# Patient Record
Sex: Female | Born: 1935 | Race: White | Hispanic: No | Marital: Married | Smoking: Never smoker
Health system: Southern US, Community
[De-identification: ages and names within clinical notes are randomized; demographics above are authoritative.]

## PROBLEM LIST (undated history)

## (undated) DIAGNOSIS — I639 Cerebral infarction, unspecified: Secondary | ICD-10-CM

## (undated) DIAGNOSIS — Z8619 Personal history of other infectious and parasitic diseases: Secondary | ICD-10-CM

## (undated) DIAGNOSIS — Z87440 Personal history of urinary (tract) infections: Secondary | ICD-10-CM

## (undated) DIAGNOSIS — M069 Rheumatoid arthritis, unspecified: Secondary | ICD-10-CM

## (undated) DIAGNOSIS — M1990 Unspecified osteoarthritis, unspecified site: Secondary | ICD-10-CM

## (undated) DIAGNOSIS — I10 Essential (primary) hypertension: Secondary | ICD-10-CM

## (undated) DIAGNOSIS — E785 Hyperlipidemia, unspecified: Secondary | ICD-10-CM

## (undated) DIAGNOSIS — Z87442 Personal history of urinary calculi: Secondary | ICD-10-CM

## (undated) DIAGNOSIS — H269 Unspecified cataract: Secondary | ICD-10-CM

## (undated) DIAGNOSIS — F419 Anxiety disorder, unspecified: Secondary | ICD-10-CM

## (undated) HISTORY — DX: Personal history of other infectious and parasitic diseases: Z86.19

## (undated) HISTORY — DX: Hyperlipidemia, unspecified: E78.5

## (undated) HISTORY — DX: Rheumatoid arthritis, unspecified: M06.9

## (undated) HISTORY — PX: ABDOMINAL HYSTERECTOMY: SHX81

## (undated) HISTORY — DX: Personal history of urinary calculi: Z87.442

## (undated) HISTORY — DX: Personal history of urinary (tract) infections: Z87.440

## (undated) HISTORY — DX: Cerebral infarction, unspecified: I63.9

## (undated) HISTORY — DX: Unspecified cataract: H26.9

## (undated) HISTORY — PX: EYE SURGERY: SHX253

## (undated) HISTORY — DX: Unspecified osteoarthritis, unspecified site: M19.90

---
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
## 1970-03-03 HISTORY — PX: PARTIAL HYSTERECTOMY: SHX80

## 1993-03-03 HISTORY — PX: BREAST BIOPSY: SHX20

## 2004-10-01 ENCOUNTER — Ambulatory Visit: Payer: Self-pay | Admitting: Internal Medicine

## 2005-05-19 ENCOUNTER — Ambulatory Visit: Payer: Self-pay | Admitting: Unknown Physician Specialty

## 2006-06-02 ENCOUNTER — Ambulatory Visit: Payer: Self-pay | Admitting: Urology

## 2007-07-29 ENCOUNTER — Ambulatory Visit: Payer: Self-pay | Admitting: Internal Medicine

## 2007-10-18 ENCOUNTER — Ambulatory Visit: Payer: Self-pay | Admitting: Unknown Physician Specialty

## 2009-10-29 ENCOUNTER — Ambulatory Visit: Payer: Self-pay | Admitting: Internal Medicine

## 2011-03-27 ENCOUNTER — Emergency Department: Payer: Self-pay | Admitting: Emergency Medicine

## 2011-03-27 LAB — CK TOTAL AND CKMB (NOT AT ARMC)
CK, Total: 195 U/L (ref 21–215)
CK-MB: 0.7 ng/mL (ref 0.5–3.6)

## 2011-03-27 LAB — URINALYSIS, COMPLETE
Bilirubin,UR: NEGATIVE
Glucose,UR: NEGATIVE mg/dL (ref 0–75)
Ketone: NEGATIVE
Nitrite: NEGATIVE
Protein: NEGATIVE
RBC,UR: 1 /HPF (ref 0–5)

## 2011-03-27 LAB — COMPREHENSIVE METABOLIC PANEL
Albumin: 3.6 g/dL (ref 3.4–5.0)
Alkaline Phosphatase: 61 U/L (ref 50–136)
Anion Gap: 12 (ref 7–16)
BUN: 16 mg/dL (ref 7–18)
Calcium, Total: 9 mg/dL (ref 8.5–10.1)
Co2: 26 mmol/L (ref 21–32)
Creatinine: 0.97 mg/dL (ref 0.60–1.30)
EGFR (Non-African Amer.): 60 — ABNORMAL LOW
Glucose: 113 mg/dL — ABNORMAL HIGH (ref 65–99)
Osmolality: 280 (ref 275–301)
SGOT(AST): 38 U/L — ABNORMAL HIGH (ref 15–37)
SGPT (ALT): 30 U/L
Sodium: 139 mmol/L (ref 136–145)
Total Protein: 7.6 g/dL (ref 6.4–8.2)

## 2011-03-27 LAB — TROPONIN I: Troponin-I: <0.02 ng/mL

## 2011-03-27 LAB — CBC
HCT: 42.8 % (ref 35.0–47.0)
HGB: 14.3 g/dL (ref 12.0–16.0)
MCH: 29.2 pg (ref 26.0–34.0)
MCV: 87 fL (ref 80–100)
Platelet: 261 10*3/uL (ref 150–440)
RDW: 14 % (ref 11.5–14.5)
WBC: 9.2 10*3/uL (ref 3.6–11.0)

## 2011-12-02 DIAGNOSIS — Z87442 Personal history of urinary calculi: Secondary | ICD-10-CM

## 2011-12-02 HISTORY — DX: Personal history of urinary calculi: Z87.442

## 2011-12-15 DIAGNOSIS — Z23 Encounter for immunization: Secondary | ICD-10-CM | POA: Diagnosis not present

## 2012-03-02 ENCOUNTER — Other Ambulatory Visit: Payer: Self-pay | Admitting: Internal Medicine

## 2012-03-02 NOTE — Telephone Encounter (Signed)
[facility] [phone]  Atenolol 25 mg taelt  30 tab Take 1 tablet by mouth once a day  Pt has appointment with dr Lorin Picket 05/04/12

## 2012-03-02 NOTE — Telephone Encounter (Signed)
Clorazepate 3.75mg  30 tab   Fax sheet orange folder vanessa desk

## 2012-03-05 MED ORDER — ATENOLOL 25 MG PO TABS: 25.0000 mg | ORAL_TABLET | Freq: Every day | ORAL | Status: DC

## 2012-03-05 MED ORDER — CLORAZEPATE DIPOTASSIUM 3.75 MG PO TABS: 3.7500 mg | ORAL_TABLET | Freq: Every day | ORAL | Status: DC

## 2012-03-05 NOTE — Telephone Encounter (Signed)
Sent in to pharmacy.  

## 2012-04-14 ENCOUNTER — Telehealth: Payer: Self-pay | Admitting: *Deleted

## 2012-04-14 MED ORDER — PAROXETINE HCL 10 MG PO TABS: 10.0000 mg | ORAL_TABLET | ORAL | Status: DC

## 2012-04-14 NOTE — Telephone Encounter (Signed)
Refill Request  Paroxetine HCL 10 mg tablet  #30  Take 1 tablet by mouth once a day

## 2012-04-29 ENCOUNTER — Encounter: Payer: Self-pay | Admitting: *Deleted

## 2012-04-29 DIAGNOSIS — Z86010 Personal history of colonic polyps: Secondary | ICD-10-CM

## 2012-05-04 ENCOUNTER — Ambulatory Visit: Payer: Self-pay | Admitting: Internal Medicine

## 2012-06-10 ENCOUNTER — Other Ambulatory Visit: Payer: Self-pay | Admitting: *Deleted

## 2012-06-11 ENCOUNTER — Telehealth: Payer: Self-pay | Admitting: Internal Medicine

## 2012-06-11 MED ORDER — CLORAZEPATE DIPOTASSIUM 3.75 MG PO TABS: 3.7500 mg | ORAL_TABLET | Freq: Every day | ORAL | Status: DC

## 2012-06-11 MED ORDER — ATENOLOL 25 MG PO TABS: 25.0000 mg | ORAL_TABLET | Freq: Every day | ORAL | Status: DC

## 2012-06-11 MED ORDER — PAROXETINE HCL 10 MG PO TABS: 10.0000 mg | ORAL_TABLET | ORAL | Status: DC

## 2012-06-11 NOTE — Telephone Encounter (Signed)
Refilled paxil, tranxene, and atenolol x 1.  Needs to keep her appt

## 2012-06-17 DIAGNOSIS — S0083XA Contusion of other part of head, initial encounter: Secondary | ICD-10-CM | POA: Diagnosis not present

## 2012-06-17 DIAGNOSIS — S022XXA Fracture of nasal bones, initial encounter for closed fracture: Secondary | ICD-10-CM | POA: Diagnosis not present

## 2012-06-17 DIAGNOSIS — S1093XA Contusion of unspecified part of neck, initial encounter: Secondary | ICD-10-CM | POA: Diagnosis not present

## 2012-06-24 ENCOUNTER — Ambulatory Visit (INDEPENDENT_AMBULATORY_CARE_PROVIDER_SITE_OTHER): Payer: BC Managed Care – PPO | Admitting: Internal Medicine

## 2012-06-24 ENCOUNTER — Encounter: Payer: Self-pay | Admitting: Internal Medicine

## 2012-06-24 VITALS — BP 120/80 | HR 57 | Temp 97.6°F | Ht 65.5 in | Wt 148.5 lb

## 2012-06-24 DIAGNOSIS — I10 Essential (primary) hypertension: Secondary | ICD-10-CM

## 2012-06-24 DIAGNOSIS — E7800 Pure hypercholesterolemia, unspecified: Secondary | ICD-10-CM | POA: Diagnosis not present

## 2012-06-24 DIAGNOSIS — Z86010 Personal history of colonic polyps: Secondary | ICD-10-CM

## 2012-06-24 DIAGNOSIS — F411 Generalized anxiety disorder: Secondary | ICD-10-CM

## 2012-06-24 DIAGNOSIS — F419 Anxiety disorder, unspecified: Secondary | ICD-10-CM

## 2012-06-24 DIAGNOSIS — N200 Calculus of kidney: Secondary | ICD-10-CM

## 2012-06-24 MED ORDER — CLORAZEPATE DIPOTASSIUM 3.75 MG PO TABS: 3.7500 mg | ORAL_TABLET | Freq: Every day | ORAL | Status: DC

## 2012-06-24 MED ORDER — ATENOLOL 25 MG PO TABS: 25.0000 mg | ORAL_TABLET | Freq: Every day | ORAL | Status: DC

## 2012-06-24 MED ORDER — PAROXETINE HCL 10 MG PO TABS: 10.0000 mg | ORAL_TABLET | ORAL | Status: DC

## 2012-06-24 NOTE — Progress Notes (Signed)
  Subjective:    [name], female    DOB: 11/07/1935, 77 y.o.   MRN: [id number]  HPI 77 year old female with past history of nephrolithiasis, hypercholesterolemia and anxiety who comes in today to follow up on these issues as well as for a complete physical exam.  She has changed jobs.  Loves her new job.  Works from home 2 days/week.  Recently fractured her nose.  Was running after her dog and tripped over a wire.  Fell on her face.  Saw Dr Chestine Spore.  No intervention required.  Gave her some steroid nasal spray.  States her blood pressure has been doing well.  Stays active.  No cardiac symptoms with increased activity or exertion.  Eating and drinking well.  Taking an herbal supplement for her cholesterol.  Declines statin medication.  Bowels stable.  Mother passed away.  Handling stress well.    Past Medical History  Diagnosis Date  . History of kidney stones October 2013  . Arthritis   . Hyperlipidemia   . Hx: UTI (urinary tract infection)   . History of chicken pox     Review of Systems Patient denies any headache, lightheadedness or dizziness.  Nose healing well.  No significant pain now.  No chest pain, tightness or palpitations.  No increased shortness of breath, cough or congestion.  No nausea or vomiting.  No acid reflux.  No abdominal pain or cramping.  No bowel change, such as diarrhea, constipation, BRBPR or melana.  No urine change.  No vaginal symptoms. Handling stress well.      Objective:   Physical Exam Filed Vitals:   06/24/12 1107  BP: 120/80  Pulse: 57  Temp: 97.6 F (90.28 C)   77 year old female in no acute distress.   HEENT:  Nares- clear.  Oropharynx - without lesions. NECK:  Supple.  Nontender.  No audible bruit.  HEART:  Appears to be regular. LUNGS:  No crackles or wheezing audible.  Respirations even and unlabored.  RADIAL PULSE:  Equal bilaterally.    BREASTS:  No nipple discharge or nipple retraction present.  Could not appreciate any distinct  nodules or axillary adenopathy.  ABDOMEN:  Soft, nontender.  Bowel sounds present and normal.  No audible abdominal bruit.  GU:  Pt declined.    EXTREMITIES:  No increased edema present.  DP pulses palpable and equal bilaterally.           Assessment & Plan:  NASAL FRACTURE.  Saw Dr Chestine Spore.  Healing well.  Doing well.  Follow.   CARDIOVASCULAR.  Stays active.  Walks regularly.  No cardiac symptoms with increased activity or exertion.    HEALTH MAINTENANCE.  Physical today.  She declined GU/rectal exam.  Is s/p hysterectomy.  Has declined bone density.  She declines for me to schedule a mammogram.  Last colonoscopy 10/18/07 revealed four 8mm polyps in the descending colon, transverse colon, ascending colon and cecum and internal hemorrhoids.  Overdue follow up .  Agreed for GI referral today.

## 2012-06-25 ENCOUNTER — Encounter: Payer: Self-pay | Admitting: Internal Medicine

## 2012-06-25 DIAGNOSIS — Z86010 Personal history of colonic polyps: Secondary | ICD-10-CM | POA: Insufficient documentation

## 2012-06-25 DIAGNOSIS — E7800 Pure hypercholesterolemia, unspecified: Secondary | ICD-10-CM | POA: Insufficient documentation

## 2012-06-25 DIAGNOSIS — F419 Anxiety disorder, unspecified: Secondary | ICD-10-CM | POA: Insufficient documentation

## 2012-06-25 DIAGNOSIS — N200 Calculus of kidney: Secondary | ICD-10-CM | POA: Insufficient documentation

## 2012-06-25 NOTE — Assessment & Plan Note (Signed)
Last colonoscopy 10/18/07 with polyps.  Overdue follow up colonoscopy.  Agreed to referral to GI.

## 2012-06-25 NOTE — Assessment & Plan Note (Signed)
Low cholesterol diet and exercise.  Taking an herbal supplement for her cholesterol.  Declines statin medication.  Check lipid panel.

## 2012-06-25 NOTE — Assessment & Plan Note (Signed)
Has a history of kidney stones.  Previous CT negative.  Was worked up by Dr Lonna Cobb.  Currently asymptomatic.

## 2012-06-25 NOTE — Assessment & Plan Note (Signed)
Doing well on paxil and tranxene.  Follow.    

## 2012-06-30 ENCOUNTER — Other Ambulatory Visit: Payer: Medicare Other

## 2012-07-01 ENCOUNTER — Other Ambulatory Visit: Payer: Medicare Other

## 2012-07-13 ENCOUNTER — Encounter: Payer: Self-pay | Admitting: Internal Medicine

## 2012-09-07 ENCOUNTER — Other Ambulatory Visit: Payer: Self-pay | Admitting: *Deleted

## 2012-09-07 MED ORDER — CLORAZEPATE DIPOTASSIUM 3.75 MG PO TABS: 3.7500 mg | ORAL_TABLET | Freq: Every day | ORAL | Status: DC | PRN

## 2012-09-07 NOTE — Telephone Encounter (Signed)
Refilled tranxene #30 with one refill.  Ok to call in.

## 2012-09-07 NOTE — Telephone Encounter (Signed)
RX called into pharmacy

## 2012-09-07 NOTE — Telephone Encounter (Signed)
Okay to refill? 

## 2012-11-04 ENCOUNTER — Other Ambulatory Visit: Payer: Self-pay | Admitting: *Deleted

## 2012-11-04 DIAGNOSIS — H35379 Puckering of macula, unspecified eye: Secondary | ICD-10-CM | POA: Diagnosis not present

## 2012-11-04 DIAGNOSIS — H35371 Puckering of macula, right eye: Secondary | ICD-10-CM | POA: Insufficient documentation

## 2012-11-04 DIAGNOSIS — H2510 Age-related nuclear cataract, unspecified eye: Secondary | ICD-10-CM | POA: Diagnosis not present

## 2012-11-05 MED ORDER — CLORAZEPATE DIPOTASSIUM 3.75 MG PO TABS: 3.7500 mg | ORAL_TABLET | Freq: Every day | ORAL | Status: DC | PRN

## 2012-11-05 NOTE — Telephone Encounter (Signed)
Refilled #30 with one refill (tranxene)

## 2012-11-29 DIAGNOSIS — H2513 Age-related nuclear cataract, bilateral: Secondary | ICD-10-CM | POA: Insufficient documentation

## 2012-11-29 DIAGNOSIS — H35379 Puckering of macula, unspecified eye: Secondary | ICD-10-CM | POA: Diagnosis not present

## 2012-11-29 DIAGNOSIS — H35372 Puckering of macula, left eye: Secondary | ICD-10-CM | POA: Insufficient documentation

## 2012-12-03 ENCOUNTER — Other Ambulatory Visit: Payer: Self-pay | Admitting: *Deleted

## 2012-12-03 MED ORDER — PAROXETINE HCL 10 MG PO TABS: 10.0000 mg | ORAL_TABLET | ORAL | Status: DC

## 2012-12-03 MED ORDER — ATENOLOL 25 MG PO TABS: 25.0000 mg | ORAL_TABLET | Freq: Every day | ORAL | Status: DC

## 2012-12-13 DIAGNOSIS — L820 Inflamed seborrheic keratosis: Secondary | ICD-10-CM | POA: Diagnosis not present

## 2012-12-13 DIAGNOSIS — Z0189 Encounter for other specified special examinations: Secondary | ICD-10-CM | POA: Diagnosis not present

## 2012-12-24 ENCOUNTER — Encounter: Payer: Self-pay | Admitting: *Deleted

## 2012-12-24 ENCOUNTER — Ambulatory Visit: Payer: Self-pay | Admitting: Unknown Physician Specialty

## 2012-12-24 DIAGNOSIS — Z09 Encounter for follow-up examination after completed treatment for conditions other than malignant neoplasm: Secondary | ICD-10-CM | POA: Diagnosis not present

## 2012-12-24 DIAGNOSIS — K5730 Diverticulosis of large intestine without perforation or abscess without bleeding: Secondary | ICD-10-CM | POA: Diagnosis not present

## 2012-12-24 DIAGNOSIS — Z860100 Personal history of colon polyps, unspecified: Secondary | ICD-10-CM | POA: Diagnosis not present

## 2012-12-24 DIAGNOSIS — D126 Benign neoplasm of colon, unspecified: Secondary | ICD-10-CM | POA: Diagnosis not present

## 2012-12-24 DIAGNOSIS — K649 Unspecified hemorrhoids: Secondary | ICD-10-CM | POA: Diagnosis not present

## 2012-12-24 DIAGNOSIS — Z79899 Other long term (current) drug therapy: Secondary | ICD-10-CM | POA: Diagnosis not present

## 2012-12-27 ENCOUNTER — Ambulatory Visit: Payer: Medicare Other | Admitting: Internal Medicine

## 2012-12-27 LAB — PATHOLOGY REPORT

## 2013-01-17 ENCOUNTER — Encounter: Payer: Self-pay | Admitting: Internal Medicine

## 2013-01-18 ENCOUNTER — Other Ambulatory Visit: Payer: Self-pay | Admitting: *Deleted

## 2013-01-19 ENCOUNTER — Encounter: Payer: Self-pay | Admitting: *Deleted

## 2013-01-19 MED ORDER — CLORAZEPATE DIPOTASSIUM 3.75 MG PO TABS: 3.7500 mg | ORAL_TABLET | Freq: Every day | ORAL | Status: DC

## 2013-01-19 NOTE — Telephone Encounter (Signed)
noted 

## 2013-01-19 NOTE — Telephone Encounter (Signed)
Refilled medication (no refills) & I mailed pt a letter to call the office to schedule an appt before she needs another refill. Tried to reach patient by phone (disconnected)

## 2013-01-19 NOTE — Telephone Encounter (Signed)
Can refill x 1, but she missed her last appt.  Need to reschedule her for a 30 minute f/u appt.

## 2013-01-31 ENCOUNTER — Encounter: Payer: Self-pay | Admitting: Internal Medicine

## 2013-01-31 ENCOUNTER — Ambulatory Visit (INDEPENDENT_AMBULATORY_CARE_PROVIDER_SITE_OTHER): Payer: Medicare Other | Admitting: Internal Medicine

## 2013-01-31 ENCOUNTER — Encounter: Payer: Self-pay | Admitting: Emergency Medicine

## 2013-01-31 VITALS — BP 130/90 | HR 53 | Temp 97.5°F | Ht 65.5 in | Wt 147.5 lb

## 2013-01-31 DIAGNOSIS — E7800 Pure hypercholesterolemia, unspecified: Secondary | ICD-10-CM

## 2013-01-31 DIAGNOSIS — Z1239 Encounter for other screening for malignant neoplasm of breast: Secondary | ICD-10-CM

## 2013-01-31 DIAGNOSIS — I10 Essential (primary) hypertension: Secondary | ICD-10-CM

## 2013-01-31 DIAGNOSIS — N200 Calculus of kidney: Secondary | ICD-10-CM

## 2013-01-31 DIAGNOSIS — Z86010 Personal history of colonic polyps: Secondary | ICD-10-CM

## 2013-01-31 DIAGNOSIS — R82998 Other abnormal findings in urine: Secondary | ICD-10-CM | POA: Diagnosis not present

## 2013-01-31 DIAGNOSIS — M549 Dorsalgia, unspecified: Secondary | ICD-10-CM | POA: Diagnosis not present

## 2013-01-31 DIAGNOSIS — F419 Anxiety disorder, unspecified: Secondary | ICD-10-CM

## 2013-01-31 DIAGNOSIS — R8290 Unspecified abnormal findings in urine: Secondary | ICD-10-CM

## 2013-01-31 DIAGNOSIS — Z23 Encounter for immunization: Secondary | ICD-10-CM | POA: Diagnosis not present

## 2013-01-31 DIAGNOSIS — F411 Generalized anxiety disorder: Secondary | ICD-10-CM

## 2013-01-31 LAB — LIPID PANEL
Cholesterol: 305 mg/dL — ABNORMAL HIGH (ref 0–200)
HDL: 34.5 mg/dL — ABNORMAL LOW (ref >39.00)
Total CHOL/HDL Ratio: 9
Triglycerides: 327 mg/dL — ABNORMAL HIGH (ref 0.0–149.0)
VLDL: 65.4 mg/dL — ABNORMAL HIGH (ref 0.0–40.0)

## 2013-01-31 LAB — CBC WITH DIFFERENTIAL/PLATELET
Basophils Absolute: 0.1 10*3/uL (ref 0.0–0.1)
Basophils Relative: 0.9 % (ref 0.0–3.0)
Eosinophils Absolute: 0.3 10*3/uL (ref 0.0–0.7)
MCHC: 33.5 g/dL (ref 30.0–36.0)
MCV: 86.5 fl (ref 78.0–100.0)
Monocytes Absolute: 0.6 10*3/uL (ref 0.1–1.0)
Monocytes Relative: 7.5 % (ref 3.0–12.0)
Neutro Abs: 4.9 10*3/uL (ref 1.4–7.7)
Neutrophils Relative %: 58.2 % (ref 43.0–77.0)
Platelets: 299 10*3/uL (ref 150.0–400.0)
RBC: 5.07 Mil/uL (ref 3.87–5.11)
RDW: 14.1 % (ref 11.5–14.6)

## 2013-01-31 LAB — URINALYSIS, ROUTINE W REFLEX MICROSCOPIC
Bilirubin Urine: NEGATIVE
Ketones, ur: NEGATIVE
Leukocytes, UA: NEGATIVE
Nitrite: NEGATIVE
RBC / HPF: NONE SEEN (ref 0–?)
Specific Gravity, Urine: >=1.03 (ref 1.000–1.030)
Urobilinogen, UA: 0.2 (ref 0.0–1.0)
pH: 5.5 (ref 5.0–8.0)

## 2013-01-31 LAB — COMPREHENSIVE METABOLIC PANEL
AST: 25 U/L (ref 0–37)
Alkaline Phosphatase: 55 U/L (ref 39–117)
BUN: 18 mg/dL (ref 6–23)
Creatinine, Ser: 1 mg/dL (ref 0.4–1.2)
Total Protein: 7.3 g/dL (ref 6.0–8.3)

## 2013-01-31 LAB — TSH: TSH: 1.8 u[IU]/mL (ref 0.35–5.50)

## 2013-01-31 NOTE — Progress Notes (Signed)
Subjective:    [name], female    DOB: 1935/03/08, 77 y.o.   MRN: [id number]  HPI 77 year old female with past history of nephrolithiasis, hypercholesterolemia and anxiety who comes in today to follow up on these issues as well as for a complete physical exam.  Stays active.  No cardiac symptoms with increased activity or exertion.  Eating and drinking well.  She reports that starting last night, she noticed some left lower back pain.  Took hydrocodone x 2.  She is not having any pain now.  States she is concerned about another kidney stone.  Has seen Dr Lonna Cobb previously.  Has flares intermittently.  Last episode (prior to last night) was six months ago.  No urinary symptoms now except for some odor with her urine.  No abdominal pain or back pain.  No nausea or vomiting.  She is taking the tranxene regularly now - one per night.  She is not working now.  Some increased stress related to this.  Had her colonoscopy 12/24/12.  Bowels stable.      Past Medical History  Diagnosis Date  . History of kidney stones October 2013  . Arthritis   . Hyperlipidemia   . Hx: UTI (urinary tract infection)   . History of chicken pox     Current Outpatient Prescriptions on File Prior to Visit  Medication Sig Dispense Refill  . aspirin 81 MG tablet Take 81 mg by mouth daily.      Marland Kitchen atenolol (TENORMIN) 25 MG tablet Take 1 tablet (25 mg total) by mouth daily.  30 tablet  5  . clorazepate (TRANXENE) 3.75 MG tablet Take 1 tablet (3.75 mg total) by mouth daily. **NEEDS APPT FOR ADDITIONAL REFILLS**PLEASE CONTACT OFFICE FOR APPT**  30 tablet  0  . PARoxetine (PAXIL) 10 MG tablet Take 1 tablet (10 mg total) by mouth every morning.  30 tablet  5  . vitamin C (ASCORBIC ACID) 500 MG tablet Take 500 mg by mouth 2 (two) times daily.       No current facility-administered medications on file prior to visit.    Review of Systems Patient denies any headache, lightheadedness or dizziness.  No sinus or allergy  symptoms.  No chest pain, tightness or palpitations.  No increased shortness of breath, cough or congestion.  No nausea or vomiting.  No acid reflux.  No abdominal pain or cramping.  No bowel change, such as diarrhea, constipation, BRBPR or melana.  Some urinary odor as outlined.   No vaginal symptoms.  The back pain has resolved.  No pain now.      Objective:   Physical Exam  Filed Vitals:   01/31/13 0821  BP: 130/90  Pulse: 53  Temp: 97.5 F (36.4 C)   Blood pressure recheck:  12268, pulse 37-63  77 year old female in no acute distress.   HEENT:  Nares- clear.  Oropharynx - without lesions. NECK:  Supple.  Nontender.  No audible bruit.  HEART:  Appears to be regular. LUNGS:  No crackles or wheezing audible.  Respirations even and unlabored.  RADIAL PULSE:  Equal bilaterally.  ABDOMEN:  Soft, nontender.  Bowel sounds present and normal.  No audible abdominal bruit.   EXTREMITIES:  No increased edema present.  DP pulses palpable and equal bilaterally.      BACK:  No CVA tenderness.       Assessment & Plan:  CARDIOVASCULAR.  Stays active.  Walks regularly.  No cardiac symptoms  with increased activity or exertion.    HEALTH MAINTENANCE.  Physical last visit.  She declined GU/rectal exam.  Is s/p hysterectomy.  Has declined bone density.   Last colonoscopy 12/24/12 revealed one cecal polyp and one polyp in the descending colon and diverticuclosis.  Schedule a mammogram.     I spent 25 minutes with the patient and more than 50% of the time was spent in consultation regarding the above.

## 2013-01-31 NOTE — Progress Notes (Signed)
Pre-visit discussion using our clinic review tool. No additional management support is needed unless otherwise documented below in the visit note.  

## 2013-02-01 ENCOUNTER — Encounter: Payer: Self-pay | Admitting: Internal Medicine

## 2013-02-01 ENCOUNTER — Encounter: Payer: Self-pay | Admitting: *Deleted

## 2013-02-01 NOTE — Assessment & Plan Note (Signed)
Doing well on paxil and tranxene.  Follow.    

## 2013-02-01 NOTE — Assessment & Plan Note (Signed)
Colonoscopy as outlined.  Bowels doing well.     

## 2013-02-01 NOTE — Assessment & Plan Note (Signed)
Had back pain last night.  Resolved now.  Discussed with her today regarding further w/up.  She declined.  Discussed referral to urology.  She declined.  Will check urinalysis and culture.

## 2013-02-01 NOTE — Assessment & Plan Note (Signed)
Low cholesterol diet and exercise.   Declines statin medication.  Check lipid panel.    

## 2013-02-02 ENCOUNTER — Encounter: Payer: Self-pay | Admitting: *Deleted

## 2013-02-15 DIAGNOSIS — Z1231 Encounter for screening mammogram for malignant neoplasm of breast: Secondary | ICD-10-CM | POA: Diagnosis not present

## 2013-02-15 DIAGNOSIS — R922 Inconclusive mammogram: Secondary | ICD-10-CM | POA: Diagnosis not present

## 2013-03-01 ENCOUNTER — Other Ambulatory Visit: Payer: Self-pay | Admitting: *Deleted

## 2013-03-01 MED ORDER — CLORAZEPATE DIPOTASSIUM 3.75 MG PO TABS: 3.7500 mg | ORAL_TABLET | Freq: Every day | ORAL | Status: DC | PRN

## 2013-03-01 NOTE — Telephone Encounter (Signed)
Refilled #30 with one refill.  Pt had appt 12/14.

## 2013-03-09 DIAGNOSIS — N63 Unspecified lump in unspecified breast: Secondary | ICD-10-CM | POA: Diagnosis not present

## 2013-03-09 DIAGNOSIS — R928 Other abnormal and inconclusive findings on diagnostic imaging of breast: Secondary | ICD-10-CM | POA: Diagnosis not present

## 2013-03-09 DIAGNOSIS — N6009 Solitary cyst of unspecified breast: Secondary | ICD-10-CM | POA: Diagnosis not present

## 2013-03-09 LAB — HM MAMMOGRAPHY

## 2013-03-10 ENCOUNTER — Encounter: Payer: Self-pay | Admitting: Internal Medicine

## 2013-05-18 ENCOUNTER — Encounter: Payer: Self-pay | Admitting: Internal Medicine

## 2013-06-29 ENCOUNTER — Other Ambulatory Visit: Payer: Self-pay | Admitting: *Deleted

## 2013-06-29 MED ORDER — ATENOLOL 25 MG PO TABS: 25.0000 mg | ORAL_TABLET | Freq: Every day | ORAL | Status: DC

## 2013-07-01 ENCOUNTER — Encounter: Payer: Commercial Managed Care - PPO | Admitting: Internal Medicine

## 2013-07-11 ENCOUNTER — Telehealth: Payer: Self-pay | Admitting: Internal Medicine

## 2013-07-11 ENCOUNTER — Other Ambulatory Visit: Payer: Medicare Other

## 2013-07-11 NOTE — Telephone Encounter (Signed)
If she is having body aches and thinks has Lyme's - needs appt.  I cannot just order labs without evaluating.  She needs to be seen before labs are ordered - so know what labs need to be done.  These labs take a while to come back and would need treatment prior to lab results returning - if treatment needed.

## 2013-07-11 NOTE — Telephone Encounter (Signed)
Pt notified. Appt scheduled with Raquel for tomorrow for evaluation and labs

## 2013-07-11 NOTE — Telephone Encounter (Signed)
Pt states she needs to come in for blood work.  States she thinks she may have Lyme's disease because her yard is covered with ticks and she has been feeling bad with unusual body pain.  States she only needs the lab.  Appt has been scheduled with Dr. Nicki Reaper 5/14 but pt wants to come today.  Lab appt scheduled.  Please advise.

## 2013-07-12 ENCOUNTER — Ambulatory Visit (INDEPENDENT_AMBULATORY_CARE_PROVIDER_SITE_OTHER): Payer: Medicare Other | Admitting: Adult Health

## 2013-07-12 ENCOUNTER — Encounter: Payer: Self-pay | Admitting: Adult Health

## 2013-07-12 VITALS — BP 124/70 | HR 63 | Temp 97.6°F | Resp 14 | Wt 146.5 lb

## 2013-07-12 DIAGNOSIS — M2550 Pain in unspecified joint: Secondary | ICD-10-CM | POA: Diagnosis not present

## 2013-07-12 DIAGNOSIS — W57XXXA Bitten or stung by nonvenomous insect and other nonvenomous arthropods, initial encounter: Secondary | ICD-10-CM

## 2013-07-12 DIAGNOSIS — M353 Polymyalgia rheumatica: Secondary | ICD-10-CM | POA: Insufficient documentation

## 2013-07-12 DIAGNOSIS — T148 Other injury of unspecified body region: Secondary | ICD-10-CM

## 2013-07-12 LAB — SEDIMENTATION RATE: Sed Rate: 53 mm/hr — ABNORMAL HIGH (ref 0–22)

## 2013-07-12 MED ORDER — DOXYCYCLINE HYCLATE 100 MG PO TABS: 100.0000 mg | ORAL_TABLET | Freq: Two times a day (BID) | ORAL | Status: DC

## 2013-07-12 MED ORDER — CLORAZEPATE DIPOTASSIUM 3.75 MG PO TABS: 3.7500 mg | ORAL_TABLET | Freq: Every day | ORAL | Status: DC | PRN

## 2013-07-12 MED ORDER — ATENOLOL 25 MG PO TABS: 25.0000 mg | ORAL_TABLET | Freq: Every day | ORAL | Status: DC

## 2013-07-12 MED ORDER — PAROXETINE HCL 10 MG PO TABS: 10.0000 mg | ORAL_TABLET | ORAL | Status: DC

## 2013-07-12 NOTE — Progress Notes (Signed)
[name], female   DOB: 01/17/1936, 78 y.o.   MRN: [id number]    Subjective:    [name], female    DOB: 1935/08/26, 78 y.o.   MRN: [id number]  HPI  Pt presents with multiple tick bites over a period of 3 months. She has been experiencing some joint pains. First started in the right shoulder then progressed to the left shoulder. Now she is experiencing some lower extremity muscle aches. Also aching in her back. She has been taking BC powders. She reports that her daughter is telling her that she has some memory changes. She reports having low grade temp of 99.5 and also had some chills. Also reports headaches. She wants to be tested for lyme dz. No rashes - erythema migrans. No shortness of breath, irregular heart palpitations or sensations. No syncope.   Past Medical History  Diagnosis Date  . History of kidney stones October 2013  . Arthritis   . Hyperlipidemia   . Hx: UTI (urinary tract infection)   . History of chicken pox     Past Surgical History  Procedure Laterality Date  . Breast biopsy Right 1995  . Tonsillectomy and adenoidectomy  1945  . Partial hysterectomy  1972    Family History  Problem Relation Age of Onset  . Heart disease Father     History   Social History  . Marital Status: Married    Spouse Name: N/A    Number of Children: N/A  . Years of Education: N/A   Occupational History  . Not on file.   Social History Main Topics  . Smoking status: Never Smoker   . Smokeless tobacco: Never Used  . Alcohol Use: No  . Drug Use: No  . Sexual Activity: Not on file   Other Topics Concern  . Not on file   Social History Narrative  . No narrative on file    Current Outpatient Prescriptions on File Prior to Visit  Medication Sig Dispense Refill  . aspirin 81 MG tablet Take 81 mg by mouth daily.      Marland Kitchen atenolol (TENORMIN) 25 MG tablet Take 1 tablet (25 mg total) by mouth daily.  30 tablet  0  . clorazepate (TRANXENE) 3.75 MG tablet  Take 1 tablet (3.75 mg total) by mouth daily as needed for anxiety.  30 tablet  1  . PARoxetine (PAXIL) 10 MG tablet Take 1 tablet (10 mg total) by mouth every morning.  30 tablet  5  . vitamin C (ASCORBIC ACID) 500 MG tablet Take 500 mg by mouth 2 (two) times daily.       No current facility-administered medications on file prior to visit.    Review of Systems  Constitutional: Positive for fever (low grade), chills and fatigue.  Respiratory: Negative.   Cardiovascular: Negative.   Musculoskeletal: Positive for arthralgias and myalgias.  Skin: Negative for rash.  Neurological: Positive for headaches.  Psychiatric/Behavioral:       Forgetful       Objective:  BP 124/70  Pulse 63  Temp(Src) 97.6 F (36.4 C) (Oral)  Resp 14  Wt 146 lb 8 oz (66.452 kg)  SpO2 98%   Physical Exam  Constitutional: She is oriented to person, place, and time. She appears well-developed and well-nourished. No distress.  HENT:  Head: Normocephalic and atraumatic.  Eyes: Conjunctivae and EOM are normal.  Neck: Normal range of motion. Neck supple.  Cardiovascular: Normal rate, regular rhythm, normal heart sounds and  intact distal pulses.  Exam reveals no gallop and no friction rub.   No murmur heard. Pulmonary/Chest: Effort normal and breath sounds normal. No respiratory distress. She has no wheezes. She has no rales.  Abdominal: Soft. Bowel sounds are normal. She exhibits no distension and no mass. There is no tenderness. There is no rebound and no guarding.  Musculoskeletal: She exhibits tenderness.  Bilateral upper extremity with decreased ROM. Unable to abduct arm fully. Can only abduct to 90 degree. Arthralgias  Neurological: She is alert and oriented to person, place, and time. She has normal reflexes. Coordination normal.  Skin: Skin is warm and dry.  Psychiatric: She has a normal mood and affect. Her behavior is normal. Judgment and thought content normal.      Assessment & Plan:   1. Tick  bites Multiple ticks removed from her body in the last 3 months. Reports ongoing arthralgias and myalgias for the past 2 months. Check for lyme dz - Elisa. If negative then no further testing. If positive, will need to do western blot. Note, spent greater than 30 minutes in the assessment, evaluation, implementation of care as well as in education regarding tick borne illness. I am treating her empirically with Doxycycline bid x 14 days.  2. Arthralgia Will check additional blood work given her report of arthralgias. - Sedimentation rate - Rheumatoid factor

## 2013-07-12 NOTE — Patient Instructions (Signed)
  Please have your blood work drawn prior to leaving the office. I am checking for lyme disease.  Please start Doxycycline 100 mg twice a day for 14 days.

## 2013-07-12 NOTE — Progress Notes (Signed)
Pre visit review using our clinic review tool, if applicable. No additional management support is needed unless otherwise documented below in the visit note. 

## 2013-07-13 ENCOUNTER — Other Ambulatory Visit: Payer: Self-pay | Admitting: *Deleted

## 2013-07-13 DIAGNOSIS — M2550 Pain in unspecified joint: Secondary | ICD-10-CM | POA: Diagnosis not present

## 2013-07-14 ENCOUNTER — Other Ambulatory Visit: Payer: Self-pay | Admitting: Adult Health

## 2013-07-14 ENCOUNTER — Ambulatory Visit (INDEPENDENT_AMBULATORY_CARE_PROVIDER_SITE_OTHER): Payer: Medicare Other | Admitting: Internal Medicine

## 2013-07-14 ENCOUNTER — Encounter: Payer: Self-pay | Admitting: Internal Medicine

## 2013-07-14 VITALS — BP 120/70 | HR 64 | Temp 98.0°F | Ht 65.5 in | Wt 147.2 lb

## 2013-07-14 DIAGNOSIS — IMO0001 Reserved for inherently not codable concepts without codable children: Secondary | ICD-10-CM

## 2013-07-14 DIAGNOSIS — M791 Myalgia, unspecified site: Secondary | ICD-10-CM

## 2013-07-14 DIAGNOSIS — M2550 Pain in unspecified joint: Secondary | ICD-10-CM | POA: Diagnosis not present

## 2013-07-14 DIAGNOSIS — R700 Elevated erythrocyte sedimentation rate: Secondary | ICD-10-CM

## 2013-07-14 LAB — RHEUMATOID FACTOR: Rhuematoid fact SerPl-aCnc: <10 IU/mL (ref <=14)

## 2013-07-14 LAB — B. BURGDORFI ANTIBODIES: B burgdorferi Ab IgG+IgM: 0.18 {ISR}

## 2013-07-15 ENCOUNTER — Encounter: Payer: Self-pay | Admitting: Internal Medicine

## 2013-07-15 LAB — CBC WITH DIFFERENTIAL/PLATELET
BASOS PCT: 0.3 % (ref 0.0–3.0)
Basophils Absolute: 0 10*3/uL (ref 0.0–0.1)
EOS PCT: 2.5 % (ref 0.0–5.0)
Eosinophils Absolute: 0.4 10*3/uL (ref 0.0–0.7)
HEMATOCRIT: 44.1 % (ref 36.0–46.0)
Hemoglobin: 14.8 g/dL (ref 12.0–15.0)
LYMPHS ABS: 2.9 10*3/uL (ref 0.7–4.0)
Lymphocytes Relative: 19.9 % (ref 12.0–46.0)
MCHC: 33.5 g/dL (ref 30.0–36.0)
MCV: 87 fl (ref 78.0–100.0)
MONO ABS: 0.9 10*3/uL (ref 0.1–1.0)
MONOS PCT: 6.1 % (ref 3.0–12.0)
Neutro Abs: 10.4 10*3/uL — ABNORMAL HIGH (ref 1.4–7.7)
Neutrophils Relative %: 71.2 % (ref 43.0–77.0)
PLATELETS: 403 10*3/uL — AB (ref 150.0–400.0)
RBC: 5.07 Mil/uL (ref 3.87–5.11)
RDW: 13.7 % (ref 11.5–15.5)
WBC: 14.6 10*3/uL — AB (ref 4.0–10.5)

## 2013-07-15 LAB — SEDIMENTATION RATE: Sed Rate: 42 mm/hr — ABNORMAL HIGH (ref 0–22)

## 2013-07-15 LAB — CK: CK TOTAL: 42 U/L (ref 7–177)

## 2013-07-15 NOTE — Progress Notes (Signed)
Subjective:    [name], female    DOB: January 02, 1936, 78 y.o.   MRN: [id number]  HPI 78 year old female with past history of nephrolithiasis, hypercholesterolemia and anxiety who comes in today for a scheduled follow up.  She was just seen two days ago by [employer].  Was evaluated for fever, pain and concern over possible Lymes.  See Raquel's note for details.  She states that starting about 2-4 weeks ago, she had a cold and cough.  This resolved.  She then has developed increased shoulder pain and upper arm pain.  Describes most of her pain in her posterior neck, upper arms and shoulder.  Some headache.  When she turns her head, some pulling in her neck.  She was started on doxycycline and has been taking for two days.  States her neck feels some better.  No rash now.  She also describes some leg aching.  Eating and drinking.  Of note, states that her left wrist and thumb swelled and few weeks ago and then resolve.  Taking ibuprofren.       Past Medical History  Diagnosis Date  . History of kidney stones October 2013  . Arthritis   . Hyperlipidemia   . Hx: UTI (urinary tract infection)   . History of chicken pox     Current Outpatient Prescriptions on File Prior to Visit  Medication Sig Dispense Refill  . aspirin 81 MG tablet Take 81 mg by mouth daily.      Marland Kitchen atenolol (TENORMIN) 25 MG tablet Take 1 tablet (25 mg total) by mouth daily.  30 tablet  2  . clorazepate (TRANXENE) 3.75 MG tablet Take 1 tablet (3.75 mg total) by mouth daily as needed for anxiety.  30 tablet  0  . doxycycline (VIBRA-TABS) 100 MG tablet Take 1 tablet (100 mg total) by mouth 2 (two) times daily.  28 tablet  0  . PARoxetine (PAXIL) 10 MG tablet Take 1 tablet (10 mg total) by mouth every morning.  30 tablet  5  . vitamin C (ASCORBIC ACID) 500 MG tablet Take 500 mg by mouth 2 (two) times daily.       No current facility-administered medications on file prior to visit.    Review of Systems Patient denies  any significant headache, lightheadedness or dizziness.  Mild headache.  No sinus or allergy symptoms.  No chest pain, tightness or palpitations.  No increased shortness of breath, cough or congestion.  No nausea or vomiting.  No acid reflux.  No abdominal pain or cramping.  No bowel change, such as diarrhea.  Neck, shoulder and upper arm pain as outlined.  No fever.       Objective:   Physical Exam  Filed Vitals:   07/14/13 1555  BP: 120/70  Pulse: 64  Temp: 98 F (53.1 C)   78 year old female in no acute distress.   HEENT:  Nares- clear.  Oropharynx - without lesions. NECK:  Supple.  Nontender.  No audible bruit.  HEART:  Appears to be regular. LUNGS:  No crackles or wheezing audible.  Respirations even and unlabored.  RADIAL PULSE:  Equal bilaterally.  ABDOMEN:  Soft, nontender.  Bowel sounds present and normal.  No audible abdominal bruit.   EXTREMITIES:  No increased edema present.  DP pulses palpable and equal bilaterally.      MSK:  Increased pain to palpation over the posterior shoulders.  Increased pain with attempts at abduction and extension of  her upper extremities - especially at or above 90 degrees.  No focal motor weakness appreciated upper or lower extremities.       Assessment & Plan:  CARDIOVASCULAR.  Stays active.  Previously walks regularly.    HEALTH MAINTENANCE.   She declined GU/rectal exam.  Is s/p hysterectomy.  Has declined bone density.   Last colonoscopy 12/24/12 revealed one cecal polyp and one polyp in the descending colon and diverticuclosis.  Mammogram 02/15/13 recommended f/u views.  F/u ultrasound Birads II.     I spent 25 minutes with the patient and more than 50% of the time was spent in consultation regarding the above.

## 2013-07-15 NOTE — Assessment & Plan Note (Signed)
Persistent increased shoulder, neck and upper arm pain.  Also has some leg pain as well.  On doxycyline.  Does note some improvement.  Given the limitation, etc, will recheck ESR.  Check CK and cbc.  Continue doxycycline.  Follow closely.  If any worsening change in symptoms or problems she is to be reevaluated.

## 2013-07-19 ENCOUNTER — Other Ambulatory Visit: Payer: Self-pay | Admitting: Internal Medicine

## 2013-07-19 ENCOUNTER — Other Ambulatory Visit (INDEPENDENT_AMBULATORY_CARE_PROVIDER_SITE_OTHER): Payer: Medicare Other

## 2013-07-19 DIAGNOSIS — R700 Elevated erythrocyte sedimentation rate: Secondary | ICD-10-CM

## 2013-07-19 DIAGNOSIS — D72829 Elevated white blood cell count, unspecified: Secondary | ICD-10-CM

## 2013-07-19 LAB — SEDIMENTATION RATE: SED RATE: 32 mm/h — AB (ref 0–22)

## 2013-07-19 NOTE — Progress Notes (Signed)
Order placed for f/u cbc.   

## 2013-07-20 ENCOUNTER — Other Ambulatory Visit (INDEPENDENT_AMBULATORY_CARE_PROVIDER_SITE_OTHER): Payer: Medicare Other

## 2013-07-20 DIAGNOSIS — D72829 Elevated white blood cell count, unspecified: Secondary | ICD-10-CM | POA: Diagnosis not present

## 2013-07-20 LAB — CBC WITH DIFFERENTIAL/PLATELET
BASOS PCT: 0.5 % (ref 0.0–3.0)
Basophils Absolute: 0.1 10*3/uL (ref 0.0–0.1)
Eosinophils Absolute: 0.2 10*3/uL (ref 0.0–0.7)
Eosinophils Relative: 2.1 % (ref 0.0–5.0)
HCT: 42.2 % (ref 36.0–46.0)
HEMOGLOBIN: 14.1 g/dL (ref 12.0–15.0)
LYMPHS PCT: 19.5 % (ref 12.0–46.0)
Lymphs Abs: 2.1 10*3/uL (ref 0.7–4.0)
MCHC: 33.3 g/dL (ref 30.0–36.0)
MCV: 86.7 fl (ref 78.0–100.0)
Monocytes Absolute: 0.6 10*3/uL (ref 0.1–1.0)
Monocytes Relative: 5.1 % (ref 3.0–12.0)
NEUTROS ABS: 7.8 10*3/uL — AB (ref 1.4–7.7)
Neutrophils Relative %: 72.8 % (ref 43.0–77.0)
Platelets: 398 10*3/uL (ref 150.0–400.0)
RBC: 4.87 Mil/uL (ref 3.87–5.11)
RDW: 14.1 % (ref 11.5–15.5)
WBC: 10.8 10*3/uL — ABNORMAL HIGH (ref 4.0–10.5)

## 2013-07-26 ENCOUNTER — Telehealth: Payer: Self-pay | Admitting: Internal Medicine

## 2013-07-26 DIAGNOSIS — M79603 Pain in arm, unspecified: Secondary | ICD-10-CM

## 2013-07-26 NOTE — Telephone Encounter (Signed)
Patient request to be call because she is still in pain and wanted to know if she can have a rx for prednisone/msn

## 2013-07-26 NOTE — Telephone Encounter (Signed)
Pt called back checking to see if the rx has been sent in. [facility] in [place] Pt stated she is not doing any better.

## 2013-07-26 NOTE — Telephone Encounter (Signed)
Since it is unclear the etiology of her pain and her labs are ok, I would recommend a referral to rheumatology for evaluation of increased joint pain.  Would hold on prednisone given previous concern about possible infection.  If agreeable, let me know and I will place order for referral.

## 2013-07-26 NOTE — Telephone Encounter (Signed)
Spoke with Letitia Libra (Ms. Slates daughter & Dr. Annette Stable nurse) & gave her your advice. She would like for you to call her at: [phone]. She states that the referral is fine, but she still feels that her mom needs to be on the prednisone.

## 2013-07-26 NOTE — Telephone Encounter (Signed)
Pt was seen on 07/14/13 & saw Raquel prior to that for same sx's-please advise.

## 2013-07-27 NOTE — Telephone Encounter (Signed)
Late Entry.  Called Beth Espinoza and Beth Espinoza - multiple times [phone]).  Unable to reach.  Left message.  Explained my desire for referral and to hold on prednisone.  Will contact rheumatology.  Will contact her with appt.

## 2013-07-28 MED ORDER — PREDNISONE 5 MG PO TABS: ORAL_TABLET | ORAL | Status: DC

## 2013-07-28 NOTE — Telephone Encounter (Signed)
Doni Saunders left VM, wanting return call from Dr. Nicki Reaper only to discuss pt. Can be reached at [phone]

## 2013-07-28 NOTE — Telephone Encounter (Signed)
Called pt.  Unable to reach [facility].  Concern over PMR.  Will start her on prednisone 5mg  tid.  She will call with an update next week.  Will refer to rheumatology.  Any worsening or problems before - she will call or be evaluated.  Order placed for referral to rheumatology.

## 2013-08-02 ENCOUNTER — Telehealth: Payer: Self-pay | Admitting: *Deleted

## 2013-08-02 NOTE — Telephone Encounter (Signed)
I contacted patient after receiving a form requiring a PA for her Prednisone. Pt states that she has been on the Prednisone since it was sent in on 07/28/13. It is working very well & she states that the Prednisone is "a miracle drug". She has been very active & doing tons of cleaning.

## 2013-08-02 NOTE — Telephone Encounter (Signed)
Please notify pt that I am glad she is feeling better.  I still want her to f/u with Dr Jefm Bryant.  Seems like this may be PMR as we discussed (since she had such a good response to the prednisone).  I want her to f/u with him to follow for the slow taper off.  Will not be able to just stop the prednisone.  She should be hearing from someone about an appt.  Let me know if any problems.

## 2013-08-02 NOTE — Telephone Encounter (Signed)
LMTCB

## 2013-08-05 ENCOUNTER — Encounter: Payer: Self-pay | Admitting: *Deleted

## 2013-08-05 DIAGNOSIS — M353 Polymyalgia rheumatica: Secondary | ICD-10-CM | POA: Diagnosis not present

## 2013-08-05 NOTE — Telephone Encounter (Signed)
Sent pt a letter also

## 2013-08-19 ENCOUNTER — Telehealth: Payer: Self-pay | Admitting: Internal Medicine

## 2013-08-19 NOTE — Telephone Encounter (Signed)
Pt to be seen in [facility]

## 2013-08-19 NOTE — Telephone Encounter (Signed)
Patient Information:  Caller Name: Elizabella  Phone: [phone]  Patient: Beth Espinoza, Beth Espinoza  Gender: Female  DOB: 02-18-1936  Age: 78 Years  PCP: [name]  Office Follow Up:  Does the office need to follow up with this patient?: No  Instructions For The Office: N/A  RN Note:  She states that she can not wait for an appt; wants to come right now- RN attempted to schedule an appt but none in the office and offered another location but she refused and stated that she will go to UC now to be seen.  Symptoms  Reason For Call & Symptoms: Pt is calling and states that she has poison oak all over her and can not sleep; requesting Prednisone; rash is located between fingers, legs , arms and trunk; pt is on a low dose of Prednisone by hx;  Reviewed Health History In EMR: Yes  Reviewed Medications In EMR: Yes  Reviewed Allergies In EMR: Yes  Reviewed Surgeries / Procedures: Yes  Date of Onset of Symptoms: 08/18/2013  Guideline(s) Used:  Poison Ivy - Oak or [employer]  Disposition Per Guideline:   See Today in Office  Reason For Disposition Reached:   Severe itching interferes with normal activities (e.g., work or school) or prevents sleep  Advice Given:  N/A  Patient Will Follow Care Advice:  YES

## 2013-08-22 DIAGNOSIS — L255 Unspecified contact dermatitis due to plants, except food: Secondary | ICD-10-CM | POA: Diagnosis not present

## 2013-10-07 ENCOUNTER — Ambulatory Visit (INDEPENDENT_AMBULATORY_CARE_PROVIDER_SITE_OTHER): Payer: Medicare Other | Admitting: Internal Medicine

## 2013-10-07 ENCOUNTER — Encounter: Payer: Self-pay | Admitting: Internal Medicine

## 2013-10-07 VITALS — BP 100/80 | HR 62 | Temp 97.7°F | Ht 66.0 in | Wt 145.8 lb

## 2013-10-07 DIAGNOSIS — F419 Anxiety disorder, unspecified: Secondary | ICD-10-CM

## 2013-10-07 DIAGNOSIS — Z860100 Personal history of colon polyps, unspecified: Secondary | ICD-10-CM

## 2013-10-07 DIAGNOSIS — F411 Generalized anxiety disorder: Secondary | ICD-10-CM

## 2013-10-07 DIAGNOSIS — E7800 Pure hypercholesterolemia, unspecified: Secondary | ICD-10-CM | POA: Diagnosis not present

## 2013-10-07 DIAGNOSIS — N200 Calculus of kidney: Secondary | ICD-10-CM

## 2013-10-07 DIAGNOSIS — M353 Polymyalgia rheumatica: Secondary | ICD-10-CM | POA: Diagnosis not present

## 2013-10-07 NOTE — Progress Notes (Signed)
Pre visit review using our clinic review tool, if applicable. No additional management support is needed unless otherwise documented below in the visit note. 

## 2013-10-09 ENCOUNTER — Encounter: Payer: Self-pay | Admitting: Internal Medicine

## 2013-10-09 NOTE — Assessment & Plan Note (Signed)
Have discussed with her regarding further w/up.  She declined.  Discussed referral to urology.  She declined.

## 2013-10-09 NOTE — Assessment & Plan Note (Signed)
Doing well on paxil and tranxene.  Follow.

## 2013-10-09 NOTE — Assessment & Plan Note (Signed)
Low cholesterol diet and exercise.   Declines statin medication.  Check lipid panel.

## 2013-10-09 NOTE — Assessment & Plan Note (Signed)
Colonoscopy as outlined.  Bowels doing well.     

## 2013-10-09 NOTE — Assessment & Plan Note (Signed)
Neck and shoulder aching as outlined.  On lower dose of prednisone.  See above.  Will temporarily increase back up to 2 prednisone tablets per day.  Call Dr Jefm Bryant next week for taper schedule.

## 2013-10-09 NOTE — Progress Notes (Signed)
Subjective:    [name], female    DOB: [birth year], 78 y.o.   MRN: [id number]  HPI 78 year old female with past history of nephrolithiasis, hypercholesterolemia and anxiety who comes in today to follow up on these issues as well as for a complete physical exam.  She was recently diagnosed with PMR.  On prednisone.  She recently decreased her dose.  She was instructed to decrease to one per day and she did this for a short period and then decreased to 1/2 per day.  She is back on one prednisone per day.  Still with increased pain and aching.  Feels she needs to go up on the dose, to get things calmed down.  Stays active.  No chest pain or tightness with increased activity or exertion.  Breathing stable.  Eating and drinking well.        Past Medical History  Diagnosis Date  . History of kidney stones October 2013  . Arthritis   . Hyperlipidemia   . Hx: UTI (urinary tract infection)   . History of chicken pox     Current Outpatient Prescriptions on File Prior to Visit  Medication Sig Dispense Refill  . aspirin 81 MG tablet Take 81 mg by mouth daily.      Marland Kitchen atenolol (TENORMIN) 25 MG tablet Take 1 tablet (25 mg total) by mouth daily.  30 tablet  2  . clorazepate (TRANXENE) 3.75 MG tablet Take 1 tablet (3.75 mg total) by mouth daily as needed for anxiety.  30 tablet  0  . PARoxetine (PAXIL) 10 MG tablet Take 1 tablet (10 mg total) by mouth every morning.  30 tablet  5  . vitamin C (ASCORBIC ACID) 500 MG tablet Take 500 mg by mouth 2 (two) times daily.       No current facility-administered medications on file prior to visit.    Review of Systems Patient denies any significant headache, lightheadedness or dizziness.  No headache.   No sinus or allergy symptoms.  No chest pain, tightness or palpitations.  No increased shortness of breath, cough or congestion.  No nausea or vomiting.  No acid reflux.  No abdominal pain or cramping.  No bowel change, such as diarrhea.  Neck, shoulder  and upper arm pain as outlined.  No fever.       Objective:   Physical Exam  Filed Vitals:   10/07/13 1441  BP: 100/80  Pulse: 62  Temp: 97.7 F (80.52 C)   78 year old female in no acute distress.   HEENT:  Nares- clear.  Oropharynx - without lesions. NECK:  Supple.  Nontender.  No audible bruit.  HEART:  Appears to be regular. LUNGS:  No crackles or wheezing audible.  Respirations even and unlabored.  RADIAL PULSE:  Equal bilaterally.    BREASTS:  No nipple discharge or nipple retraction present.  Could not appreciate any distinct nodules or axillary adenopathy.  ABDOMEN:  Soft, nontender.  Bowel sounds present and normal.  No audible abdominal bruit.  GU:  Not performed.     EXTREMITIES:  No increased edema present.  DP pulses palpable and equal bilaterally.          Assessment & Plan:  CARDIOVASCULAR.  Stays active.  No cardiac symptoms with increased activity or exertion.      HEALTH MAINTENANCE.   Physical today.  Is s/p hysterectomy.  Has declined bone density.   Last colonoscopy 12/24/12 revealed one cecal polyp and one  polyp in the descending colon and diverticuclosis.  Mammogram 02/15/13 recommended f/u views.  F/u ultrasound Birads II.     I spent 25 minutes with the patient and more than 50% of the time was spent in consultation regarding the above.

## 2013-10-14 ENCOUNTER — Other Ambulatory Visit (INDEPENDENT_AMBULATORY_CARE_PROVIDER_SITE_OTHER): Payer: Medicare Other

## 2013-10-14 DIAGNOSIS — F419 Anxiety disorder, unspecified: Secondary | ICD-10-CM

## 2013-10-14 DIAGNOSIS — E7800 Pure hypercholesterolemia, unspecified: Secondary | ICD-10-CM | POA: Diagnosis not present

## 2013-10-14 DIAGNOSIS — M353 Polymyalgia rheumatica: Secondary | ICD-10-CM | POA: Diagnosis not present

## 2013-10-14 LAB — CBC WITH DIFFERENTIAL/PLATELET
BASOS ABS: 0.2 10*3/uL — AB (ref 0.0–0.1)
BASOS PCT: 1.1 % (ref 0.0–3.0)
Eosinophils Absolute: 0.3 10*3/uL (ref 0.0–0.7)
Eosinophils Relative: 1.9 % (ref 0.0–5.0)
HCT: 41.3 % (ref 36.0–46.0)
HEMOGLOBIN: 13.8 g/dL (ref 12.0–15.0)
LYMPHS ABS: 3.3 10*3/uL (ref 0.7–4.0)
LYMPHS PCT: 21.7 % (ref 12.0–46.0)
MCHC: 33.4 g/dL (ref 30.0–36.0)
MCV: 89 fl (ref 78.0–100.0)
MONOS PCT: 5.6 % (ref 3.0–12.0)
Monocytes Absolute: 0.9 10*3/uL (ref 0.1–1.0)
NEUTROS ABS: 10.7 10*3/uL — AB (ref 1.4–7.7)
Neutrophils Relative %: 69.7 % (ref 43.0–77.0)
Platelets: 375 10*3/uL (ref 150.0–400.0)
RBC: 4.64 Mil/uL (ref 3.87–5.11)
RDW: 15.6 % — ABNORMAL HIGH (ref 11.5–15.5)
WBC: 15.3 10*3/uL — ABNORMAL HIGH (ref 4.0–10.5)

## 2013-10-14 LAB — COMPREHENSIVE METABOLIC PANEL
ALT: 13 U/L (ref 0–35)
AST: 24 U/L (ref 0–37)
Albumin: 3.5 g/dL (ref 3.5–5.2)
Alkaline Phosphatase: 56 U/L (ref 39–117)
BILIRUBIN TOTAL: 0.7 mg/dL (ref 0.2–1.2)
BUN: 16 mg/dL (ref 6–23)
CALCIUM: 10.1 mg/dL (ref 8.4–10.5)
CHLORIDE: 101 meq/L (ref 96–112)
CO2: 28 meq/L (ref 19–32)
Creatinine, Ser: 0.8 mg/dL (ref 0.4–1.2)
GFR: 70.03 mL/min (ref >60.00)
GLUCOSE: 81 mg/dL (ref 70–99)
Potassium: 4.8 mEq/L (ref 3.5–5.1)
Sodium: 138 mEq/L (ref 135–145)
Total Protein: 6.3 g/dL (ref 6.0–8.3)

## 2013-10-14 LAB — LIPID PANEL
CHOLESTEROL: 266 mg/dL — AB (ref 0–200)
HDL: 43.4 mg/dL (ref >39.00)
NONHDL: 222.6
Total CHOL/HDL Ratio: 6
Triglycerides: 279 mg/dL — ABNORMAL HIGH (ref 0.0–149.0)
VLDL: 55.8 mg/dL — ABNORMAL HIGH (ref 0.0–40.0)

## 2013-10-14 LAB — TSH: TSH: 2.19 u[IU]/mL (ref 0.35–4.50)

## 2013-10-14 LAB — LDL CHOLESTEROL, DIRECT: Direct LDL: 197.7 mg/dL

## 2013-10-15 ENCOUNTER — Other Ambulatory Visit: Payer: Self-pay | Admitting: Internal Medicine

## 2013-10-15 DIAGNOSIS — D72829 Elevated white blood cell count, unspecified: Secondary | ICD-10-CM

## 2013-10-15 NOTE — Progress Notes (Signed)
Order placed for f/u cbc.   

## 2013-10-17 ENCOUNTER — Encounter: Payer: Self-pay | Admitting: *Deleted

## 2013-11-10 DIAGNOSIS — M353 Polymyalgia rheumatica: Secondary | ICD-10-CM | POA: Diagnosis not present

## 2013-11-15 DIAGNOSIS — Z79899 Other long term (current) drug therapy: Secondary | ICD-10-CM | POA: Diagnosis not present

## 2013-11-15 DIAGNOSIS — M353 Polymyalgia rheumatica: Secondary | ICD-10-CM | POA: Diagnosis not present

## 2013-12-14 ENCOUNTER — Other Ambulatory Visit: Payer: Self-pay | Admitting: *Deleted

## 2013-12-14 MED ORDER — PAROXETINE HCL 10 MG PO TABS: 10.0000 mg | ORAL_TABLET | ORAL | Status: DC

## 2013-12-14 MED ORDER — ATENOLOL 25 MG PO TABS: 25.0000 mg | ORAL_TABLET | Freq: Every day | ORAL | Status: DC

## 2013-12-15 DIAGNOSIS — M353 Polymyalgia rheumatica: Secondary | ICD-10-CM | POA: Diagnosis not present

## 2013-12-15 DIAGNOSIS — Z79899 Other long term (current) drug therapy: Secondary | ICD-10-CM | POA: Diagnosis not present

## 2013-12-23 DIAGNOSIS — Z23 Encounter for immunization: Secondary | ICD-10-CM | POA: Diagnosis not present

## 2013-12-26 ENCOUNTER — Telehealth: Payer: Self-pay

## 2013-12-26 NOTE — Telephone Encounter (Signed)
The patient's daughter called and is hoping to get a medication prescribed for the patient to help her sleep.  She states the previous medicine she was taking "stopped working". She states her mother is exhausted, and needs something to help her sleep.

## 2013-12-27 NOTE — Telephone Encounter (Signed)
With these issues, she will need to be seen.  I can see her on 01/04/14 at 12:30.  (let her know that I will be out end of week - so will see her next week).

## 2013-12-27 NOTE — Telephone Encounter (Signed)
Left message for pt to return my call.

## 2013-12-28 ENCOUNTER — Encounter: Payer: Self-pay | Admitting: *Deleted

## 2013-12-28 NOTE — Telephone Encounter (Signed)
Unable to reach patient. Home phone number is incorrect. Pt's daughter's number is no longer in service. Letter mailed.

## 2014-01-04 ENCOUNTER — Ambulatory Visit: Payer: Commercial Managed Care - PPO | Admitting: Internal Medicine

## 2014-01-04 DIAGNOSIS — Z0289 Encounter for other administrative examinations: Secondary | ICD-10-CM

## 2014-01-06 ENCOUNTER — Telehealth: Payer: Self-pay | Admitting: Internal Medicine

## 2014-01-06 NOTE — Telephone Encounter (Signed)
Please advise 

## 2014-01-06 NOTE — Telephone Encounter (Signed)
Ms. Aguallo stopped by after having received a letter about her 11/4 appt. She sent her apologies for having missed it, she was out of town with a friend and forgot. She said if Dr. Nicki Reaper feels she needs to have an appt due to her not sleeping then she can reschedule. She believes she's having sleeping difficulty due to being on Prednisone. Please call the patient if she needs to have an appt before the one scheduled in December. Thank you.

## 2014-01-07 NOTE — Telephone Encounter (Signed)
I can see her at 10:30 (91min) on 01/16/14.

## 2014-01-11 NOTE — Telephone Encounter (Signed)
I can see her at 11:45 on 01/20/14

## 2014-01-11 NOTE — Telephone Encounter (Signed)
Dr. Nicki Reaper this appt has already been taken. Please advise.msn

## 2014-01-12 NOTE — Telephone Encounter (Signed)
Appt for 11/20 has been made. Pt is aware. Does this appt take the place of the 12/7 visit? msn

## 2014-01-12 NOTE — Telephone Encounter (Signed)
I will not be in the office on 02/06/14.  The day is marked - out of office.  Please reschedule the other pts.   Thanks.

## 2014-01-19 DIAGNOSIS — M353 Polymyalgia rheumatica: Secondary | ICD-10-CM | POA: Diagnosis not present

## 2014-01-20 ENCOUNTER — Ambulatory Visit (INDEPENDENT_AMBULATORY_CARE_PROVIDER_SITE_OTHER): Payer: Medicare Other | Admitting: Internal Medicine

## 2014-01-20 ENCOUNTER — Encounter: Payer: Self-pay | Admitting: Internal Medicine

## 2014-01-20 VITALS — BP 130/70 | HR 70 | Temp 98.5°F | Ht 66.0 in | Wt 152.8 lb

## 2014-01-20 DIAGNOSIS — F419 Anxiety disorder, unspecified: Secondary | ICD-10-CM | POA: Diagnosis not present

## 2014-01-20 DIAGNOSIS — R0989 Other specified symptoms and signs involving the circulatory and respiratory systems: Secondary | ICD-10-CM

## 2014-01-20 DIAGNOSIS — Z86010 Personal history of colonic polyps: Secondary | ICD-10-CM | POA: Diagnosis not present

## 2014-01-20 DIAGNOSIS — M353 Polymyalgia rheumatica: Secondary | ICD-10-CM | POA: Diagnosis not present

## 2014-01-20 DIAGNOSIS — G479 Sleep disorder, unspecified: Secondary | ICD-10-CM | POA: Diagnosis not present

## 2014-01-20 DIAGNOSIS — E7800 Pure hypercholesterolemia, unspecified: Secondary | ICD-10-CM

## 2014-01-20 MED ORDER — TRAZODONE HCL 50 MG PO TABS
25.0000 mg | ORAL_TABLET | Freq: Every evening | ORAL | Status: DC | PRN
Start: 2014-01-20 — End: 2014-04-17

## 2014-01-20 NOTE — Progress Notes (Signed)
Pre visit review using our clinic review tool, if applicable. No additional management support is needed unless otherwise documented below in the visit note. 

## 2014-01-25 ENCOUNTER — Encounter: Payer: Self-pay | Admitting: Internal Medicine

## 2014-01-25 DIAGNOSIS — G479 Sleep disorder, unspecified: Secondary | ICD-10-CM | POA: Insufficient documentation

## 2014-01-25 DIAGNOSIS — R0989 Other specified symptoms and signs involving the circulatory and respiratory systems: Secondary | ICD-10-CM | POA: Insufficient documentation

## 2014-01-25 NOTE — Progress Notes (Signed)
Subjective:    [name], female    DOB: Feb 25, 1936, 78 y.o.   MRN: [id number]  HPI 78 year old female with past history of nephrolithiasis, hypercholesterolemia and anxiety who comes in today as a work in to discuss - difficulty sleeping.   She was recently diagnosed with PMR.  On prednisone.  Followed by Dr Jefm Bryant.  Still with some pain and aching. Overall better.  She is concerned regarding weight gain on the prednisone.  Stays active.  No chest pain or tightness with increased activity or exertion.  Breathing stable.  Eating and drinking well.  She is walking.  She stopped her tranxene.  Has been off for a while.  She is taking paxil 10mg  q day.  Reports trouble sleeping.  Sleeps better on the couch.  No sleep apnea history.  discussed treatment options.       Past Medical History  Diagnosis Date  . History of kidney stones October 2013  . Arthritis   . Hyperlipidemia   . Hx: UTI (urinary tract infection)   . History of chicken pox     Current Outpatient Prescriptions on File Prior to Visit  Medication Sig Dispense Refill  . atenolol (TENORMIN) 25 MG tablet Take 1 tablet (25 mg total) by mouth daily. 30 tablet 2  . PARoxetine (PAXIL) 10 MG tablet Take 1 tablet (10 mg total) by mouth every morning. 30 tablet 5  . predniSONE (DELTASONE) 5 MG tablet Take 5 mg by mouth daily.     . vitamin C (ASCORBIC ACID) 500 MG tablet Take 500 mg by mouth 2 (two) times daily.     No current facility-administered medications on file prior to visit.    Review of Systems Patient denies any headache, lightheadedness or dizziness.  No sinus or allergy symptoms.  No chest pain, tightness or palpitations.  No increased shortness of breath, cough or congestion.  No nausea or vomiting.  No acid reflux.  No abdominal pain or cramping.  No bowel change, such as diarrhea.  Neck, shoulder and upper arm pain as outlined.  Better.  Sleep issues as outlined.  Some increased stress.  Feels she is handling  things relatively well.       Objective:   Physical Exam  Filed Vitals:   01/20/14 1150  BP: 130/70  Pulse: 70  Temp: 98.5 F (23.20 C)   78 year old female in no acute distress.   HEENT:  Nares- clear.  Oropharynx - without lesions. NECK:  Supple.  Nontender.  Left carotid bruit.  HEART:  Appears to be regular. LUNGS:  No crackles or wheezing audible.  Respirations even and unlabored.  RADIAL PULSE:  Equal bilaterally.  ABDOMEN:  Soft, nontender.  Bowel sounds present and normal.  No audible abdominal bruit.   EXTREMITIES:  No increased edema present.  DP pulses palpable and equal bilaterally.          Assessment & Plan:  1. Difficulty sleeping Discussed at length with her today.  Discussed treatment options.  Will try trazodone as directed.  Follow.  Get her back in soon to reassess.    2. PMR (polymyalgia rheumatica) On 5mg  of prednisone daily now.  Following with Dr Jefm Bryant.    3. Anxiety On paxil.  Trazodone as outlined. Follow.  Stable.   4. History of colonic polyps Colonoscopy 12/24/12 revealed one cecal polyp - tubular adenoma, one polyp in the descending colon and diverticulosis.  Recommended f/u colonoscopy in 12/2017.  5. Hypercholesterolemia Low cholesterol diet and exercise.  Declines statin medication.  Follow lipid panel.  Lab Results  Component Value Date   CHOL 266* 10/14/2013   HDL 43.40 10/14/2013   LDLDIRECT 197.7 10/14/2013   TRIG 279.0* 10/14/2013   CHOLHDL 6 10/14/2013   6. Left carotid bruit Schedule a carotid ultrasound.    7. CARDIOVASCULAR.  Stays active.  No cardiac symptoms with increased activity or exertion.      HEALTH MAINTENANCE.   Physical 10/07/13  Is s/p hysterectomy.  Has declined bone density.   Last colonoscopy 12/24/12 revealed one cecal polyp and one polyp in the descending colon and diverticuclosis.  Mammogram 02/15/13 recommended f/u views.  F/u ultrasound Birads II.   Need to schedule f/u mammogram.    I spent 25  minutes with the patient and more than 50% of the time was spent in consultation regarding the above.

## 2014-02-06 ENCOUNTER — Ambulatory Visit: Payer: Commercial Managed Care - PPO | Admitting: Internal Medicine

## 2014-02-11 DIAGNOSIS — L259 Unspecified contact dermatitis, unspecified cause: Secondary | ICD-10-CM | POA: Diagnosis not present

## 2014-02-14 ENCOUNTER — Telehealth: Payer: Self-pay | Admitting: Internal Medicine

## 2014-02-14 NOTE — Telephone Encounter (Signed)
Notify pt that the carotid ultrasound revealed no significant blockage.

## 2014-02-15 NOTE — Telephone Encounter (Signed)
Left message on patient's voicemail.

## 2014-02-15 NOTE — Telephone Encounter (Signed)
Pt called back & was notified of results

## 2014-03-16 ENCOUNTER — Encounter: Payer: Self-pay | Admitting: Internal Medicine

## 2014-03-16 ENCOUNTER — Ambulatory Visit: Payer: Commercial Managed Care - PPO | Admitting: Internal Medicine

## 2014-03-17 DIAGNOSIS — M353 Polymyalgia rheumatica: Secondary | ICD-10-CM | POA: Diagnosis not present

## 2014-03-21 DIAGNOSIS — M353 Polymyalgia rheumatica: Secondary | ICD-10-CM | POA: Diagnosis not present

## 2014-03-21 DIAGNOSIS — M1990 Unspecified osteoarthritis, unspecified site: Secondary | ICD-10-CM | POA: Diagnosis not present

## 2014-04-05 ENCOUNTER — Encounter: Payer: Self-pay | Admitting: Internal Medicine

## 2014-04-05 ENCOUNTER — Ambulatory Visit (INDEPENDENT_AMBULATORY_CARE_PROVIDER_SITE_OTHER): Payer: Medicare Other | Admitting: Internal Medicine

## 2014-04-05 VITALS — BP 110/70 | HR 55 | Temp 97.8°F | Ht 66.0 in | Wt 152.0 lb

## 2014-04-05 DIAGNOSIS — M353 Polymyalgia rheumatica: Secondary | ICD-10-CM

## 2014-04-05 DIAGNOSIS — Z0000 Encounter for general adult medical examination without abnormal findings: Secondary | ICD-10-CM | POA: Diagnosis not present

## 2014-04-05 DIAGNOSIS — Z86010 Personal history of colonic polyps: Secondary | ICD-10-CM | POA: Diagnosis not present

## 2014-04-05 DIAGNOSIS — F419 Anxiety disorder, unspecified: Secondary | ICD-10-CM

## 2014-04-05 DIAGNOSIS — G479 Sleep disorder, unspecified: Secondary | ICD-10-CM

## 2014-04-05 DIAGNOSIS — E7800 Pure hypercholesterolemia, unspecified: Secondary | ICD-10-CM

## 2014-04-05 NOTE — Progress Notes (Signed)
[name], female   DOB: 1935/10/15, 79 y.o.   MRN: [id number]   Subjective:    [name], female    DOB: 14-Mar-1935, 79 y.o.   MRN: [id number]  HPI  Patient here for a scheduled follow up.  Has a known history of PMR and hypercholesterolemia.  Recently had a flare with her arthritis.  On MTX.  Recently had prednisone increased.  Now taking 10mg  q day.  Feels better.  Joints better.  Due to follow up with Dr Jefm Bryant this week.  Discussed diet and exercise - regarding her cholesterol.  Some increased stress.  Feels she is handling things well.  Her dog has helped the increased stress.  Blood pressure has been better.  Bowels stable.  Trying to stay active.     Past Medical History  Diagnosis Date  . History of kidney stones October 2013  . Arthritis   . Hyperlipidemia   . Hx: UTI (urinary tract infection)   . History of chicken pox     Outpatient Encounter Prescriptions as of 04/05/2014  Medication Sig  . atenolol (TENORMIN) 25 MG tablet Take 1 tablet (25 mg total) by mouth daily.  Marland Kitchen PARoxetine (PAXIL) 10 MG tablet Take 1 tablet (10 mg total) by mouth every morning.  . predniSONE (DELTASONE) 10 MG tablet Take 10 mg by mouth daily with breakfast.  . traZODone (DESYREL) 50 MG tablet Take 0.5-1 tablets (25-50 mg total) by mouth at bedtime as needed for sleep.  . vitamin C (ASCORBIC ACID) 500 MG tablet Take 500 mg by mouth 2 (two) times daily.  . methotrexate (RHEUMATREX) 2.5 MG tablet Take 6 tablets per week.  . [DISCONTINUED] predniSONE (DELTASONE) 5 MG tablet Take 5 mg by mouth daily.     Review of Systems  Constitutional: Positive for fatigue. Negative for unexpected weight change.  HENT: Negative for congestion and sinus pressure.   Respiratory: Negative for cough, chest tightness and shortness of breath.   Cardiovascular: Negative for chest pain, palpitations and leg swelling.  Gastrointestinal: Negative for nausea, vomiting, abdominal pain and diarrhea.    Musculoskeletal: Positive for joint swelling (recent flare.  on increased prednisone now.  feeling better.  ). Negative for back pain.  Skin: Positive for rash (recent rash - arms.  injection helped.  ). Negative for color change.  Neurological: Negative for dizziness, light-headedness and headaches.       Objective:    Physical Exam  Constitutional: She appears well-developed and well-nourished. No distress.  HENT:  Nose: Nose normal.  Mouth/Throat: Oropharynx is clear and moist.  Neck: Neck supple. No thyromegaly present.  Cardiovascular: Normal rate and regular rhythm.   Pulmonary/Chest: Breath sounds normal. No respiratory distress. She has no wheezes.  Abdominal: Soft. Bowel sounds are normal. There is no tenderness.  Musculoskeletal: She exhibits no edema or tenderness.  Lymphadenopathy:    She has no cervical adenopathy.  Skin: Skin is warm. No erythema.    BP 110/70 mmHg  Pulse 55  Temp(Src) 97.8 F (36.6 C) (Oral)  Ht 5\' 6"  (1.676 m)  Wt 152 lb (68.947 kg)  BMI 24.55 kg/m2  SpO2 97% Wt Readings from Last 3 Encounters:  04/05/14 152 lb (68.947 kg)  01/20/14 152 lb 12 oz (69.287 kg)  10/07/13 145 lb 12 oz (66.112 kg)     Lab Results  Component Value Date   WBC 15.3* 10/14/2013   HGB 13.8 10/14/2013   HCT 41.3 10/14/2013   PLT 375.0 10/14/2013  GLUCOSE 81 10/14/2013   CHOL 266* 10/14/2013   TRIG 279.0* 10/14/2013   HDL 43.40 10/14/2013   LDLDIRECT 197.7 10/14/2013   ALT 13 10/14/2013   AST 24 10/14/2013   NA 138 10/14/2013   K 4.8 10/14/2013   CL 101 10/14/2013   CREATININE 0.8 10/14/2013   BUN 16 10/14/2013   CO2 28 10/14/2013   TSH 2.19 10/14/2013       Assessment & Plan:   Problem List Items Addressed This Visit    Anxiety    On paxil.  Has trazodone to help her sleep.  Her dog has helped.  Overall doing better.  Follow.        Difficulty sleeping    On trazodone and doing better.  Follow.        Health care maintenance    Physical  10/07/13.  Colonoscopy as outlined.  Mammogram 03/09/13 - ok.  Declines to have any further mammograms.        History of colonic polyps    Colonoscopy 12/24/12 - one cecal polyp - tubular adenoma, one polyp in the descending colon and diverticulosis.  Recommend f/u colonoscopy in 12/2017.        Hypercholesterolemia - Primary    Low cholesterol diet and exercise.  Follow lipid panel.  She declines cholesterol medication.        PMR (polymyalgia rheumatica)    Recent flare.  On MTX.  Also on increased prednisone (10mg  q day now).  Doing better.  Keep f/u appt with Dr Jefm Bryant.          I spent 25 minutes with the patient and more than 50% of the time was spent in consultation regarding the above.     [name], MD

## 2014-04-05 NOTE — Progress Notes (Signed)
Pre visit review using our clinic review tool, if applicable. No additional management support is needed unless otherwise documented below in the visit note. 

## 2014-04-09 ENCOUNTER — Encounter: Payer: Self-pay | Admitting: Internal Medicine

## 2014-04-09 DIAGNOSIS — Z0000 Encounter for general adult medical examination without abnormal findings: Secondary | ICD-10-CM | POA: Insufficient documentation

## 2014-04-09 MED ORDER — METHOTREXATE 2.5 MG PO TABS: ORAL_TABLET | ORAL | Status: DC

## 2014-04-09 NOTE — Assessment & Plan Note (Signed)
On trazodone and doing better.  Follow.

## 2014-04-09 NOTE — Assessment & Plan Note (Signed)
Physical 10/07/13.  Colonoscopy as outlined.  Mammogram 03/09/13 - ok.  Declines to have any further mammograms.

## 2014-04-09 NOTE — Assessment & Plan Note (Signed)
Low cholesterol diet and exercise.  Follow lipid panel.  She declines cholesterol medication.   

## 2014-04-09 NOTE — Assessment & Plan Note (Signed)
Recent flare.  On MTX.  Also on increased prednisone (10mg  q day now).  Doing better.  Keep f/u appt with Dr Jefm Bryant.

## 2014-04-09 NOTE — Assessment & Plan Note (Signed)
Colonoscopy 12/24/12 - one cecal polyp - tubular adenoma, one polyp in the descending colon and diverticulosis.  Recommend f/u colonoscopy in 12/2017.

## 2014-04-09 NOTE — Assessment & Plan Note (Signed)
On paxil.  Has trazodone to help her sleep.  Her dog has helped.  Overall doing better.  Follow.

## 2014-04-10 ENCOUNTER — Telehealth: Payer: Self-pay

## 2014-04-10 NOTE — Telephone Encounter (Signed)
PA started on cover my meds for methotrexate . Received a response on cover my meds. Rx has been approved. Pharmacy notified.

## 2014-04-12 NOTE — Telephone Encounter (Signed)
Form also faxed to [facility]

## 2014-04-14 DIAGNOSIS — M1990 Unspecified osteoarthritis, unspecified site: Secondary | ICD-10-CM | POA: Diagnosis not present

## 2014-04-14 DIAGNOSIS — M353 Polymyalgia rheumatica: Secondary | ICD-10-CM | POA: Diagnosis not present

## 2014-04-17 ENCOUNTER — Other Ambulatory Visit: Payer: Self-pay | Admitting: *Deleted

## 2014-04-17 MED ORDER — TRAZODONE HCL 50 MG PO TABS: 25.0000 mg | ORAL_TABLET | Freq: Every evening | ORAL | Status: DC | PRN

## 2014-04-17 NOTE — Telephone Encounter (Signed)
Okay to refill? Last seen on 04/05/14 & next appt on: 10/04/14. Please advise

## 2014-04-17 NOTE — Telephone Encounter (Signed)
Refilled trazodone #30 with one refill.

## 2014-04-20 ENCOUNTER — Other Ambulatory Visit: Payer: Self-pay | Admitting: *Deleted

## 2014-04-20 MED ORDER — ATENOLOL 25 MG PO TABS: 25.0000 mg | ORAL_TABLET | Freq: Every day | ORAL | Status: DC

## 2014-04-21 DIAGNOSIS — M1990 Unspecified osteoarthritis, unspecified site: Secondary | ICD-10-CM | POA: Diagnosis not present

## 2014-04-27 DIAGNOSIS — H524 Presbyopia: Secondary | ICD-10-CM | POA: Diagnosis not present

## 2014-05-12 DIAGNOSIS — M1990 Unspecified osteoarthritis, unspecified site: Secondary | ICD-10-CM | POA: Diagnosis not present

## 2014-05-19 DIAGNOSIS — M1990 Unspecified osteoarthritis, unspecified site: Secondary | ICD-10-CM | POA: Diagnosis not present

## 2014-07-12 DIAGNOSIS — Z79899 Other long term (current) drug therapy: Secondary | ICD-10-CM | POA: Diagnosis not present

## 2014-07-12 DIAGNOSIS — M1990 Unspecified osteoarthritis, unspecified site: Secondary | ICD-10-CM | POA: Diagnosis not present

## 2014-07-12 DIAGNOSIS — M353 Polymyalgia rheumatica: Secondary | ICD-10-CM | POA: Diagnosis not present

## 2014-07-19 DIAGNOSIS — M353 Polymyalgia rheumatica: Secondary | ICD-10-CM | POA: Diagnosis not present

## 2014-07-19 DIAGNOSIS — Z79899 Other long term (current) drug therapy: Secondary | ICD-10-CM | POA: Diagnosis not present

## 2014-07-19 DIAGNOSIS — M1990 Unspecified osteoarthritis, unspecified site: Secondary | ICD-10-CM | POA: Diagnosis not present

## 2014-07-21 ENCOUNTER — Other Ambulatory Visit: Payer: Self-pay | Admitting: *Deleted

## 2014-07-21 MED ORDER — PAROXETINE HCL 10 MG PO TABS: 10.0000 mg | ORAL_TABLET | ORAL | Status: DC

## 2014-09-11 DIAGNOSIS — M1990 Unspecified osteoarthritis, unspecified site: Secondary | ICD-10-CM | POA: Diagnosis not present

## 2014-09-11 DIAGNOSIS — Z79899 Other long term (current) drug therapy: Secondary | ICD-10-CM | POA: Diagnosis not present

## 2014-09-25 DIAGNOSIS — M353 Polymyalgia rheumatica: Secondary | ICD-10-CM | POA: Diagnosis not present

## 2014-09-25 DIAGNOSIS — F5101 Primary insomnia: Secondary | ICD-10-CM | POA: Diagnosis not present

## 2014-09-25 DIAGNOSIS — M1990 Unspecified osteoarthritis, unspecified site: Secondary | ICD-10-CM | POA: Diagnosis not present

## 2014-10-04 ENCOUNTER — Encounter (INDEPENDENT_AMBULATORY_CARE_PROVIDER_SITE_OTHER): Payer: Self-pay

## 2014-10-04 ENCOUNTER — Encounter: Payer: Self-pay | Admitting: Internal Medicine

## 2014-10-04 ENCOUNTER — Ambulatory Visit (INDEPENDENT_AMBULATORY_CARE_PROVIDER_SITE_OTHER): Payer: Medicare Other | Admitting: Internal Medicine

## 2014-10-04 VITALS — BP 130/70 | HR 56 | Temp 98.1°F | Ht 66.0 in | Wt 147.0 lb

## 2014-10-04 DIAGNOSIS — G479 Sleep disorder, unspecified: Secondary | ICD-10-CM | POA: Diagnosis not present

## 2014-10-04 DIAGNOSIS — R0989 Other specified symptoms and signs involving the circulatory and respiratory systems: Secondary | ICD-10-CM

## 2014-10-04 DIAGNOSIS — N200 Calculus of kidney: Secondary | ICD-10-CM

## 2014-10-04 DIAGNOSIS — Z86010 Personal history of colonic polyps: Secondary | ICD-10-CM

## 2014-10-04 DIAGNOSIS — Z0000 Encounter for general adult medical examination without abnormal findings: Secondary | ICD-10-CM

## 2014-10-04 DIAGNOSIS — M353 Polymyalgia rheumatica: Secondary | ICD-10-CM

## 2014-10-04 DIAGNOSIS — F419 Anxiety disorder, unspecified: Secondary | ICD-10-CM | POA: Diagnosis not present

## 2014-10-04 DIAGNOSIS — E7800 Pure hypercholesterolemia, unspecified: Secondary | ICD-10-CM

## 2014-10-04 MED ORDER — ALPRAZOLAM 0.25 MG PO TABS: 0.2500 mg | ORAL_TABLET | Freq: Every evening | ORAL | Status: DC | PRN

## 2014-10-04 NOTE — Progress Notes (Signed)
[name], female   DOB: Jan 07, 1936, 79 y.o.   MRN: [id number]   Subjective:    [name], female    DOB: 02/04/1936, 79 y.o.   MRN: [id number]  HPI  Patient here to follow up on her medical issues as well as for a complete physical exam.  She declined to have a physical.  Declined breast exam.  Declined mammogram.  She is off prednisone.  Pain is better.  She is not sleeping well.  Not taking trazodone regularly.  If takes, will only take 1/2 tablet.  Feels needs something different.  Has taken some of her daughter's xanax and this has helped.  Tolerates.  Eating and drinking well.  No nausea or vomiting.  Bowels stable.     Past Medical History  Diagnosis Date  . History of kidney stones October 2013  . Arthritis   . Hyperlipidemia   . Hx: UTI (urinary tract infection)   . History of chicken pox     Outpatient Encounter Prescriptions as of 10/04/2014  Medication Sig  . atenolol (TENORMIN) 25 MG tablet Take 1 tablet (25 mg total) by mouth daily.  . methotrexate (RHEUMATREX) 2.5 MG tablet Take 6 tablets per week.  Marland Kitchen PARoxetine (PAXIL) 10 MG tablet Take 1 tablet (10 mg total) by mouth every morning.  . vitamin C (ASCORBIC ACID) 500 MG tablet Take 500 mg by mouth 2 (two) times daily.  . [DISCONTINUED] traZODone (DESYREL) 50 MG tablet Take 0.5-1 tablets (25-50 mg total) by mouth at bedtime as needed for sleep.  Marland Kitchen ALPRAZolam (XANAX) 0.25 MG tablet Take 1 tablet (0.25 mg total) by mouth at bedtime as needed for anxiety.  . [DISCONTINUED] predniSONE (DELTASONE) 10 MG tablet Take 10 mg by mouth daily with breakfast.   No facility-administered encounter medications on file as of 10/04/2014.    Review of Systems  Constitutional: Negative for appetite change and unexpected weight change.  HENT: Negative for congestion and sinus pressure.   Eyes: Negative for pain and visual disturbance.  Respiratory: Negative for cough, chest tightness and shortness of breath.     Cardiovascular: Negative for chest pain, palpitations and leg swelling.  Gastrointestinal: Negative for nausea, vomiting, abdominal pain and diarrhea.  Genitourinary: Negative for dysuria and difficulty urinating.  Musculoskeletal: Negative for back pain and joint swelling.       Pain better.  Off prednisone now.    Skin: Negative for color change and rash.  Neurological: Negative for dizziness, light-headedness and headaches.  Hematological: Negative for adenopathy. Does not bruise/bleed easily.  Psychiatric/Behavioral: Positive for sleep disturbance. Negative for dysphoric mood and agitation.       Objective:    Physical Exam  Constitutional: She appears well-developed and well-nourished. No distress.  HENT:  Nose: Nose normal.  Mouth/Throat: Oropharynx is clear and moist.  Eyes: Conjunctivae are normal. Right eye exhibits no discharge. Left eye exhibits no discharge.  Neck: Neck supple. No thyromegaly present.  Cardiovascular: Normal rate and regular rhythm.   Pulmonary/Chest: Breath sounds normal. No respiratory distress. She has no wheezes.  Abdominal: Soft. Bowel sounds are normal. There is no tenderness.  Musculoskeletal: She exhibits no edema or tenderness.  Lymphadenopathy:    She has no cervical adenopathy.  Skin: No rash noted. No erythema.  Psychiatric: She has a normal mood and affect. Her behavior is normal.    BP 130/70 mmHg  Pulse 56  Temp(Src) 98.1 F (36.7 C) (Oral)  Ht 5\' 6"  (1.676 m)  Wt 147 lb (66.679 kg)  BMI 23.74 kg/m2  SpO2 97% Wt Readings from Last 3 Encounters:  10/04/14 147 lb (66.679 kg)  04/05/14 152 lb (68.947 kg)  01/20/14 152 lb 12 oz (69.287 kg)     Lab Results  Component Value Date   WBC 15.3* 10/14/2013   HGB 13.8 10/14/2013   HCT 41.3 10/14/2013   PLT 375.0 10/14/2013   GLUCOSE 81 10/14/2013   CHOL 266* 10/14/2013   TRIG 279.0* 10/14/2013   HDL 43.40 10/14/2013   LDLDIRECT 197.7 10/14/2013   ALT 13 10/14/2013   AST 24  10/14/2013   NA 138 10/14/2013   K 4.8 10/14/2013   CL 101 10/14/2013   CREATININE 0.8 10/14/2013   BUN 16 10/14/2013   CO2 28 10/14/2013   TSH 2.19 10/14/2013       Assessment & Plan:   Problem List Items Addressed This Visit    Anxiety    On paxil.  Not sleeping.  Wants to stop trazodone.  Wants to try xanax.  Tolerates.  rx given.  Follow.  Get her back in soon to reassess.        Relevant Medications   ALPRAZolam (XANAX) 0.25 MG tablet   Other Relevant Orders   TSH   Difficulty sleeping    Stop trazodone as outlined.  rx given for xanax.        Health care maintenance    She declined physical.  Declines mammogram.  Colonoscopy as outlined.       History of colonic polyps    Colonoscopy 12/24/12 - one cecal polyp - tubular adenoma, one polyp in the descending colon and diverticulosis.  Recommended f/u colonoscopy in 12/2017.        Hypercholesterolemia    Low cholesterol diet and exercise.  Follow lipid panel.  She declines cholesterol medication.        Relevant Orders   Lipid panel   Left carotid bruit    02/08/14 carotid ultrasound - no significant stenosis.  Follow.       Nephrolithiasis - Primary    She has declined further w/up.  Have discussed referral to urology.  She declines.        PMR (polymyalgia rheumatica)    Off prednisone.  On MTX.  Followed by Dr Jefm Bryant.        Relevant Orders   CBC with Differential/Platelet   Hepatic function panel   Basic metabolic panel     I spent 25 minutes with the patient and more than 50% of the time was spent in consultation regarding the above.     Beth Pheasant, MD

## 2014-10-04 NOTE — Progress Notes (Signed)
Pre visit review using our clinic review tool, if applicable. No additional management support is needed unless otherwise documented below in the visit note. 

## 2014-10-08 ENCOUNTER — Encounter: Payer: Self-pay | Admitting: Internal Medicine

## 2014-10-08 NOTE — Assessment & Plan Note (Signed)
She has declined further w/up.  Have discussed referral to urology.  She declines.

## 2014-10-08 NOTE — Assessment & Plan Note (Signed)
Colonoscopy 12/24/12 - one cecal polyp - tubular adenoma, one polyp in the descending colon and diverticulosis.  Recommended f/u colonoscopy in 12/2017.

## 2014-10-08 NOTE — Assessment & Plan Note (Signed)
Off prednisone.  On MTX.  Followed by Dr Jefm Bryant.

## 2014-10-08 NOTE — Assessment & Plan Note (Signed)
She declined physical.  Declines mammogram.  Colonoscopy as outlined.

## 2014-10-08 NOTE — Assessment & Plan Note (Signed)
02/08/14 carotid ultrasound - no significant stenosis.  Follow.

## 2014-10-08 NOTE — Assessment & Plan Note (Signed)
Stop trazodone as outlined.  rx given for xanax.

## 2014-10-08 NOTE — Assessment & Plan Note (Signed)
On paxil.  Not sleeping.  Wants to stop trazodone.  Wants to try xanax.  Tolerates.  rx given.  Follow.  Get her back in soon to reassess.

## 2014-10-08 NOTE — Assessment & Plan Note (Signed)
Low cholesterol diet and exercise.  Follow lipid panel.  She declines cholesterol medication.   

## 2014-10-17 ENCOUNTER — Other Ambulatory Visit (INDEPENDENT_AMBULATORY_CARE_PROVIDER_SITE_OTHER): Payer: Medicare Other

## 2014-10-17 DIAGNOSIS — E7800 Pure hypercholesterolemia, unspecified: Secondary | ICD-10-CM

## 2014-10-17 DIAGNOSIS — F419 Anxiety disorder, unspecified: Secondary | ICD-10-CM | POA: Diagnosis not present

## 2014-10-17 DIAGNOSIS — M353 Polymyalgia rheumatica: Secondary | ICD-10-CM

## 2014-10-17 LAB — CBC WITH DIFFERENTIAL/PLATELET
BASOS PCT: 0.7 % (ref 0.0–3.0)
Basophils Absolute: 0.1 10*3/uL (ref 0.0–0.1)
EOS PCT: 3.1 % (ref 0.0–5.0)
Eosinophils Absolute: 0.2 10*3/uL (ref 0.0–0.7)
HCT: 40.1 % (ref 36.0–46.0)
HEMOGLOBIN: 13.1 g/dL (ref 12.0–15.0)
Lymphocytes Relative: 20.5 % (ref 12.0–46.0)
Lymphs Abs: 1.6 10*3/uL (ref 0.7–4.0)
MCHC: 32.8 g/dL (ref 30.0–36.0)
MCV: 90.1 fl (ref 78.0–100.0)
MONOS PCT: 7.4 % (ref 3.0–12.0)
Monocytes Absolute: 0.6 10*3/uL (ref 0.1–1.0)
Neutro Abs: 5.2 10*3/uL (ref 1.4–7.7)
Neutrophils Relative %: 68.3 % (ref 43.0–77.0)
Platelets: 369 10*3/uL (ref 150.0–400.0)
RBC: 4.45 Mil/uL (ref 3.87–5.11)
RDW: 14.9 % (ref 11.5–15.5)
WBC: 7.7 10*3/uL (ref 4.0–10.5)

## 2014-10-17 LAB — BASIC METABOLIC PANEL
BUN: 15 mg/dL (ref 6–23)
CHLORIDE: 104 meq/L (ref 96–112)
CO2: 29 meq/L (ref 19–32)
Calcium: 9.8 mg/dL (ref 8.4–10.5)
Creatinine, Ser: 0.79 mg/dL (ref 0.40–1.20)
GFR: 74.97 mL/min (ref >60.00)
GLUCOSE: 98 mg/dL (ref 70–99)
POTASSIUM: 4.7 meq/L (ref 3.5–5.1)
SODIUM: 139 meq/L (ref 135–145)

## 2014-10-17 LAB — LIPID PANEL
CHOL/HDL RATIO: 7
Cholesterol: 229 mg/dL — ABNORMAL HIGH (ref 0–200)
HDL: 32.2 mg/dL — AB (ref >39.00)
NONHDL: 197.12
Triglycerides: 263 mg/dL — ABNORMAL HIGH (ref 0.0–149.0)
VLDL: 52.6 mg/dL — AB (ref 0.0–40.0)

## 2014-10-17 LAB — HEPATIC FUNCTION PANEL
ALT: 16 U/L (ref 0–35)
AST: 22 U/L (ref 0–37)
Albumin: 3.9 g/dL (ref 3.5–5.2)
Alkaline Phosphatase: 58 U/L (ref 39–117)
BILIRUBIN DIRECT: 0 mg/dL (ref 0.0–0.3)
BILIRUBIN TOTAL: 0.6 mg/dL (ref 0.2–1.2)
Total Protein: 7 g/dL (ref 6.0–8.3)

## 2014-10-17 LAB — TSH: TSH: 1.45 u[IU]/mL (ref 0.35–4.50)

## 2014-10-17 LAB — LDL CHOLESTEROL, DIRECT: Direct LDL: 137 mg/dL

## 2014-10-18 ENCOUNTER — Encounter: Payer: Self-pay | Admitting: *Deleted

## 2014-10-31 ENCOUNTER — Other Ambulatory Visit: Payer: Self-pay | Admitting: *Deleted

## 2014-10-31 NOTE — Telephone Encounter (Signed)
Okay to refill? Last refill printed on 10/04/14.

## 2014-11-01 MED ORDER — ALPRAZOLAM 0.25 MG PO TABS: 0.2500 mg | ORAL_TABLET | Freq: Every evening | ORAL | Status: DC | PRN

## 2014-11-01 NOTE — Telephone Encounter (Signed)
ok'd refill xanax #30 with no refills.   

## 2014-11-01 NOTE — Telephone Encounter (Signed)
rx faxed

## 2014-11-27 ENCOUNTER — Other Ambulatory Visit: Payer: Self-pay | Admitting: Internal Medicine

## 2014-11-28 ENCOUNTER — Other Ambulatory Visit: Payer: Self-pay

## 2014-11-29 MED ORDER — ALPRAZOLAM 0.25 MG PO TABS: 0.2500 mg | ORAL_TABLET | Freq: Every evening | ORAL | Status: DC | PRN

## 2014-11-29 NOTE — Telephone Encounter (Signed)
Rx faxed

## 2014-11-29 NOTE — Telephone Encounter (Signed)
ok'd refill for xanax #30 with no refills.  Will sign rx and place on your desk.

## 2014-12-07 ENCOUNTER — Ambulatory Visit (INDEPENDENT_AMBULATORY_CARE_PROVIDER_SITE_OTHER): Payer: Medicare Other | Admitting: Internal Medicine

## 2014-12-07 ENCOUNTER — Encounter: Payer: Self-pay | Admitting: Internal Medicine

## 2014-12-07 VITALS — BP 128/90 | HR 61 | Temp 97.7°F | Resp 18 | Ht 66.0 in | Wt 147.4 lb

## 2014-12-07 DIAGNOSIS — F419 Anxiety disorder, unspecified: Secondary | ICD-10-CM | POA: Diagnosis not present

## 2014-12-07 DIAGNOSIS — M353 Polymyalgia rheumatica: Secondary | ICD-10-CM

## 2014-12-07 DIAGNOSIS — G479 Sleep disorder, unspecified: Secondary | ICD-10-CM

## 2014-12-07 DIAGNOSIS — E7800 Pure hypercholesterolemia, unspecified: Secondary | ICD-10-CM | POA: Diagnosis not present

## 2014-12-07 DIAGNOSIS — Z86010 Personal history of colonic polyps: Secondary | ICD-10-CM | POA: Diagnosis not present

## 2014-12-07 DIAGNOSIS — R0989 Other specified symptoms and signs involving the circulatory and respiratory systems: Secondary | ICD-10-CM

## 2014-12-07 MED ORDER — ALPRAZOLAM 0.5 MG PO TABS
0.5000 mg | ORAL_TABLET | Freq: Every evening | ORAL | Status: DC | PRN
Start: 2014-12-07 — End: 2015-02-23

## 2014-12-07 NOTE — Progress Notes (Signed)
Pre-visit discussion using our clinic review tool. No additional management support is needed unless otherwise documented below in the visit note.  

## 2014-12-07 NOTE — Progress Notes (Signed)
[name], female   DOB: 02/16/1936, 79 y.o.   MRN: [id number]   Subjective:    [name], female    DOB: 16-May-1935, 79 y.o.   MRN: [id number]  HPI  Patient with past history of elevated blood pressure, anxiety and hypercholesterolemia.  She comes in today to follow up on these issues.  Last visit, I gave her xanax.  She takes one per day.  Feels needs the .5mg  dose.  This works well for her.  On paxil as well.  Has an emotional support dog.  Has helped her with increased anxiety and stress.  Stays active.  No cardiac symptoms with increased activity or exertion.  No sob.  No acid reflux.  No abdominal pain or cramping.  No bowel change.  Joints are doing well on MTX.  Has increased dose recently.  Followed by Dr Jefm Bryant.    Past Medical History  Diagnosis Date  . History of kidney stones October 2013  . Arthritis   . Hyperlipidemia   . Hx: UTI (urinary tract infection)   . History of chicken pox    Past Surgical History  Procedure Laterality Date  . Breast biopsy Right 1995  . Tonsillectomy and adenoidectomy  1945  . Partial hysterectomy  1972   Family History  Problem Relation Age of Onset  . Heart disease Father    Social History   Social History  . Marital Status: Married    Spouse Name: N/A  . Number of Children: N/A  . Years of Education: N/A   Social History Main Topics  . Smoking status: Never Smoker   . Smokeless tobacco: Never Used  . Alcohol Use: No  . Drug Use: No  . Sexual Activity: Not Asked   Other Topics Concern  . None   Social History Narrative    Outpatient Encounter Prescriptions as of 12/07/2014  Medication Sig  . atenolol (TENORMIN) 25 MG tablet Take 1 tablet (25 mg total) by mouth daily.  . methotrexate (RHEUMATREX) 2.5 MG tablet Take 6 tablets per week.  Marland Kitchen PARoxetine (PAXIL) 10 MG tablet Take 1 tablet (10 mg total) by mouth every morning.  . vitamin C (ASCORBIC ACID) 500 MG tablet Take 500 mg by mouth 2 (two) times  daily.  . [DISCONTINUED] ALPRAZolam (XANAX) 0.25 MG tablet Take 1 tablet (0.25 mg total) by mouth at bedtime as needed for anxiety.  . ALPRAZolam (XANAX) 0.5 MG tablet Take 1 tablet (0.5 mg total) by mouth at bedtime as needed for anxiety.   No facility-administered encounter medications on file as of 12/07/2014.    Review of Systems  Constitutional: Negative for appetite change and unexpected weight change.  HENT: Negative for congestion and sinus pressure.   Eyes: Negative for pain and visual disturbance.  Respiratory: Negative for cough, chest tightness and shortness of breath.   Cardiovascular: Negative for chest pain, palpitations and leg swelling.  Gastrointestinal: Negative for nausea, vomiting, abdominal pain and diarrhea.  Musculoskeletal: Negative for back pain.       Joints are doing better.   Skin: Negative for color change and rash.  Neurological: Negative for dizziness, light-headedness and headaches.  Psychiatric/Behavioral: Negative for dysphoric mood.       The emotional support animal has helped her.  Xanax helping her.  Needs to increase the dose.  We discussed long term use of xanax.         Objective:      Physical Exam  Constitutional: She  appears well-developed and well-nourished. No distress.  HENT:  Nose: Nose normal.  Mouth/Throat: Oropharynx is clear and moist.  Eyes: Conjunctivae are normal. Right eye exhibits no discharge. Left eye exhibits no discharge.  Neck: Neck supple. No thyromegaly present.  Cardiovascular: Normal rate and regular rhythm.   Pulmonary/Chest: Breath sounds normal. No respiratory distress. She has no wheezes.  Abdominal: Soft. Bowel sounds are normal. There is no tenderness.  Musculoskeletal: She exhibits no edema or tenderness.  Lymphadenopathy:    She has no cervical adenopathy.  Skin: No rash noted. No erythema.  Psychiatric: She has a normal mood and affect. Her behavior is normal.    BP 128/90 mmHg  Pulse 61  Temp(Src)  97.7 F (36.5 C) (Oral)  Resp 18  Ht 5\' 6"  (1.676 m)  Wt 147 lb 6 oz (66.849 kg)  BMI 23.80 kg/m2  SpO2 97% Wt Readings from Last 3 Encounters:  12/07/14 147 lb 6 oz (66.849 kg)  10/04/14 147 lb (66.679 kg)  04/05/14 152 lb (68.947 kg)     Lab Results  Component Value Date   WBC 7.7 10/17/2014   HGB 13.1 10/17/2014   HCT 40.1 10/17/2014   PLT 369.0 10/17/2014   GLUCOSE 98 10/17/2014   CHOL 229* 10/17/2014   TRIG 263.0* 10/17/2014   HDL 32.20* 10/17/2014   LDLDIRECT 137.0 10/17/2014   ALT 16 10/17/2014   AST 22 10/17/2014   NA 139 10/17/2014   K 4.7 10/17/2014   CL 104 10/17/2014   CREATININE 0.79 10/17/2014   BUN 15 10/17/2014   CO2 29 10/17/2014   TSH 1.45 10/17/2014       Assessment & Plan:   Problem List Items Addressed This Visit    Anxiety - Primary    Increased stress and anxiety.  Discussed at length.  Continue paxil.  Increase xanax to .5mg  q day.  Follow.  She is doing better.        Relevant Medications   ALPRAZolam (XANAX) 0.5 MG tablet   Difficulty sleeping    Xanax helping her sleep.  Increase dose as outlined.        History of colonic polyps    Colonoscopy 12/24/12 as outlined in overview.  Recommend f/u colonoscopy in 12/2017.        Hypercholesterolemia    Continue low cholesterol diet and exercise.  Last cholesterol panel improved.  Follow lipid panel. She declines statin medication.       Left carotid bruit    Carotid ultrasound 01/2014 - no significant stenosis.        PMR (polymyalgia rheumatica) (HCC)    On MTX.  Dose recently increased.  Doing better.  No joint pains.  Continue f/u with Dr Jefm Bryant.            [name], MD

## 2014-12-10 ENCOUNTER — Encounter: Payer: Self-pay | Admitting: Internal Medicine

## 2014-12-10 NOTE — Assessment & Plan Note (Signed)
On MTX.  Dose recently increased.  Doing better.  No joint pains.  Continue f/u with Dr Jefm Bryant.

## 2014-12-10 NOTE — Assessment & Plan Note (Signed)
Xanax helping her sleep.  Increase dose as outlined.

## 2014-12-10 NOTE — Assessment & Plan Note (Signed)
Colonoscopy 12/24/12 as outlined in overview.  Recommend f/u colonoscopy in 12/2017.

## 2014-12-10 NOTE — Assessment & Plan Note (Signed)
Increased stress and anxiety.  Discussed at length.  Continue paxil.  Increase xanax to .5mg  q day.  Follow.  She is doing better.

## 2014-12-10 NOTE — Assessment & Plan Note (Addendum)
Continue low cholesterol diet and exercise.  Last cholesterol panel improved.  Follow lipid panel. She declines statin medication.

## 2014-12-10 NOTE — Assessment & Plan Note (Signed)
Carotid ultrasound 01/2014 - no significant stenosis.

## 2014-12-13 DIAGNOSIS — Z23 Encounter for immunization: Secondary | ICD-10-CM | POA: Diagnosis not present

## 2014-12-14 ENCOUNTER — Other Ambulatory Visit: Payer: Self-pay | Admitting: Internal Medicine

## 2014-12-27 DIAGNOSIS — M353 Polymyalgia rheumatica: Secondary | ICD-10-CM | POA: Diagnosis not present

## 2014-12-27 DIAGNOSIS — M1990 Unspecified osteoarthritis, unspecified site: Secondary | ICD-10-CM | POA: Diagnosis not present

## 2015-01-15 ENCOUNTER — Other Ambulatory Visit: Payer: Self-pay | Admitting: Internal Medicine

## 2015-01-29 DIAGNOSIS — Z79899 Other long term (current) drug therapy: Secondary | ICD-10-CM | POA: Diagnosis not present

## 2015-01-29 DIAGNOSIS — M353 Polymyalgia rheumatica: Secondary | ICD-10-CM | POA: Diagnosis not present

## 2015-01-29 DIAGNOSIS — M1990 Unspecified osteoarthritis, unspecified site: Secondary | ICD-10-CM | POA: Diagnosis not present

## 2015-02-06 ENCOUNTER — Ambulatory Visit (INDEPENDENT_AMBULATORY_CARE_PROVIDER_SITE_OTHER): Payer: Medicare Other | Admitting: Internal Medicine

## 2015-02-06 ENCOUNTER — Encounter: Payer: Self-pay | Admitting: Internal Medicine

## 2015-02-06 VITALS — BP 128/70 | HR 58 | Temp 97.7°F | Resp 18 | Ht 66.0 in | Wt 146.0 lb

## 2015-02-06 DIAGNOSIS — E7800 Pure hypercholesterolemia, unspecified: Secondary | ICD-10-CM

## 2015-02-06 DIAGNOSIS — IMO0001 Reserved for inherently not codable concepts without codable children: Secondary | ICD-10-CM

## 2015-02-06 DIAGNOSIS — M353 Polymyalgia rheumatica: Secondary | ICD-10-CM

## 2015-02-06 DIAGNOSIS — G479 Sleep disorder, unspecified: Secondary | ICD-10-CM | POA: Diagnosis not present

## 2015-02-06 DIAGNOSIS — F419 Anxiety disorder, unspecified: Secondary | ICD-10-CM

## 2015-02-06 DIAGNOSIS — R030 Elevated blood-pressure reading, without diagnosis of hypertension: Secondary | ICD-10-CM

## 2015-02-06 DIAGNOSIS — R0989 Other specified symptoms and signs involving the circulatory and respiratory systems: Secondary | ICD-10-CM | POA: Diagnosis not present

## 2015-02-06 NOTE — Progress Notes (Signed)
[name], female   DOB: Nov 22, 1935, 79 y.o.   MRN: [id number]   Subjective:    [name], female    DOB: [birth year], 79 y.o.   MRN: [id number]  HPI  Patient with past history of hypercholesterolemia and anxiety.  She comes in today for a scheduled follow up.  Her blood pressure was elevated on last visit.  Here to follow up on this.  She is doing well.  Has her support dog with her.  This helps with her anxiety and stress.  Stays active.  No cardiac symptoms with increased activity or exertion.  No sob.  No abdominal pain or cramping.  On paxil.  Takes xanax to help her relax at night.  Works well for her.     Past Medical History  Diagnosis Date  . History of kidney stones October 2013  . Arthritis   . Hyperlipidemia   . Hx: UTI (urinary tract infection)   . History of chicken pox    Past Surgical History  Procedure Laterality Date  . Breast biopsy Right 1995  . Tonsillectomy and adenoidectomy  1945  . Partial hysterectomy  1972   Family History  Problem Relation Age of Onset  . Heart disease Father    Social History   Social History  . Marital Status: Married    Spouse Name: N/A  . Number of Children: N/A  . Years of Education: N/A   Social History Main Topics  . Smoking status: Never Smoker   . Smokeless tobacco: Never Used  . Alcohol Use: No  . Drug Use: No  . Sexual Activity: Not Asked   Other Topics Concern  . None   Social History Narrative    Outpatient Encounter Prescriptions as of 02/06/2015  Medication Sig  . ALPRAZolam (XANAX) 0.5 MG tablet Take 1 tablet (0.5 mg total) by mouth at bedtime as needed for anxiety.  Marland Kitchen atenolol (TENORMIN) 25 MG tablet Take 1 tablet (25 mg total) by mouth daily.  . methotrexate (RHEUMATREX) 2.5 MG tablet Take 6 tablets per week. (Patient taking differently: Take 8 tablets per week.)  . PARoxetine (PAXIL) 10 MG tablet Take 1 tablet (10 mg total) by mouth every morning.  . traZODone (DESYREL) 50 MG tablet  Take 0.5-1 tablets (25-50 mg total) by mouth at bedtime as needed forsleep.  . vitamin C (ASCORBIC ACID) 500 MG tablet Take 500 mg by mouth 2 (two) times daily.   No facility-administered encounter medications on file as of 02/06/2015.    Review of Systems  Constitutional: Negative for appetite change and unexpected weight change.  HENT: Negative for congestion and sinus pressure.   Respiratory: Negative for cough, chest tightness and shortness of breath.   Cardiovascular: Negative for chest pain, palpitations and leg swelling.  Gastrointestinal: Negative for nausea, vomiting, abdominal pain and diarrhea.  Genitourinary: Negative for dysuria and difficulty urinating.  Musculoskeletal: Negative for back pain and joint swelling.  Skin: Negative for color change and rash.  Neurological: Negative for dizziness, light-headedness and headaches.  Psychiatric/Behavioral: Negative for dysphoric mood and agitation.       Objective:     Blood pressure rechecked by me:  134/68  Physical Exam  Constitutional: She appears well-developed and well-nourished. No distress.  HENT:  Nose: Nose normal.  Mouth/Throat: Oropharynx is clear and moist.  Neck: Neck supple. No thyromegaly present.  Cardiovascular: Normal rate and regular rhythm.   Pulmonary/Chest: Breath sounds normal. No respiratory distress. She has no wheezes.  Abdominal: Soft. Bowel sounds are normal. There is no tenderness.  Musculoskeletal: She exhibits no edema or tenderness.  Lymphadenopathy:    She has no cervical adenopathy.  Skin: No rash noted. No erythema.  Psychiatric: She has a normal mood and affect. Her behavior is normal.    BP 128/70 mmHg  Pulse 58  Temp(Src) 97.7 F (36.5 C) (Oral)  Resp 18  Ht 5\' 6"  (1.676 m)  Wt 146 lb (66.225 kg)  BMI 23.58 kg/m2  SpO2 95% Wt Readings from Last 3 Encounters:  02/06/15 146 lb (66.225 kg)  12/07/14 147 lb 6 oz (66.849 kg)  10/04/14 147 lb (66.679 kg)     Lab Results    Component Value Date   WBC 7.7 10/17/2014   HGB 13.1 10/17/2014   HCT 40.1 10/17/2014   PLT 369.0 10/17/2014   GLUCOSE 98 10/17/2014   CHOL 229* 10/17/2014   TRIG 263.0* 10/17/2014   HDL 32.20* 10/17/2014   LDLDIRECT 137.0 10/17/2014   ALT 16 10/17/2014   AST 22 10/17/2014   NA 139 10/17/2014   K 4.7 10/17/2014   CL 104 10/17/2014   CREATININE 0.79 10/17/2014   BUN 15 10/17/2014   CO2 29 10/17/2014   TSH 1.45 10/17/2014       Assessment & Plan:   Problem List Items Addressed This Visit    Anxiety    On paxil.  Has xanax.  Follow.        Difficulty sleeping    Xanax working well.  Follow.        Elevated blood pressure    Blood pressure better today.  Follow.  On no medication.        Hypercholesterolemia    Cholesterol 10/2014 significantly improved.  Follow.  Declines statin medication.        Left carotid bruit    Carotid ultrasound 01/2014 - no significant stenosis.        PMR (polymyalgia rheumatica) (HCC) - Primary    On MTX.  Off prednisone.  Followed by Dr Jefm Bryant.            [name], MD

## 2015-02-06 NOTE — Progress Notes (Signed)
Pre-visit discussion using our clinic review tool. No additional management support is needed unless otherwise documented below in the visit note.  

## 2015-02-11 ENCOUNTER — Encounter: Payer: Self-pay | Admitting: Internal Medicine

## 2015-02-11 DIAGNOSIS — I10 Essential (primary) hypertension: Secondary | ICD-10-CM | POA: Insufficient documentation

## 2015-02-11 NOTE — Assessment & Plan Note (Signed)
Cholesterol 10/2014 significantly improved.  Follow.  Declines statin medication.

## 2015-02-11 NOTE — Assessment & Plan Note (Signed)
On MTX.  Off prednisone.  Followed by Dr Jefm Bryant.

## 2015-02-11 NOTE — Assessment & Plan Note (Signed)
Carotid ultrasound 01/2014 - no significant stenosis.   

## 2015-02-11 NOTE — Assessment & Plan Note (Signed)
Xanax working well.  Follow.

## 2015-02-11 NOTE — Assessment & Plan Note (Signed)
On paxil.  Has xanax.  Follow.

## 2015-02-11 NOTE — Assessment & Plan Note (Signed)
Blood pressure better today.  Follow.  On no medication.

## 2015-02-12 ENCOUNTER — Other Ambulatory Visit: Payer: Self-pay | Admitting: Internal Medicine

## 2015-02-12 NOTE — Telephone Encounter (Signed)
Need to confirm with pt that she is taking both trazodone and alprazolam.   Just let me know.  I thought she told me she was taking alprazolam.

## 2015-02-12 NOTE — Telephone Encounter (Signed)
Please advise 

## 2015-02-22 ENCOUNTER — Other Ambulatory Visit: Payer: Self-pay | Admitting: Internal Medicine

## 2015-02-22 ENCOUNTER — Other Ambulatory Visit: Payer: Self-pay

## 2015-02-22 MED ORDER — TRAZODONE HCL 50 MG PO TABS: ORAL_TABLET | ORAL | Status: DC

## 2015-02-22 NOTE — Telephone Encounter (Signed)
Trazodone last refilled 01/15/15 for #30 0 refills. Ok to refill this medication?

## 2015-02-22 NOTE — Telephone Encounter (Signed)
ok'd refill for trazodone #30 with no refills.

## 2015-02-22 NOTE — Telephone Encounter (Signed)
Please advise 

## 2015-02-23 ENCOUNTER — Other Ambulatory Visit: Payer: Self-pay | Admitting: Nurse Practitioner

## 2015-02-23 ENCOUNTER — Telehealth: Payer: Self-pay

## 2015-02-23 ENCOUNTER — Other Ambulatory Visit: Payer: Self-pay

## 2015-02-23 ENCOUNTER — Telehealth: Payer: Self-pay | Admitting: *Deleted

## 2015-02-23 MED ORDER — ALPRAZOLAM 0.5 MG PO TABS: 0.5000 mg | ORAL_TABLET | Freq: Every evening | ORAL | Status: DC | PRN

## 2015-02-23 NOTE — Telephone Encounter (Signed)
See message below °

## 2015-02-23 NOTE — Telephone Encounter (Signed)
This rx was just sent in on 02/22/15.  Already addressed per record.

## 2015-02-23 NOTE — Telephone Encounter (Signed)
Patient is requesting a refill for Alprazolam 0.5 mg.  Needs new script

## 2015-02-23 NOTE — Telephone Encounter (Signed)
Patient requested a medication refill for Alprazolam.  

## 2015-02-23 NOTE — Telephone Encounter (Signed)
Spoke with pharmacy and pended order from 02/12/15 was never received.  PCP currently out of office.  NP to print and fax Rx for Xanax 30 tablets.

## 2015-02-26 NOTE — Telephone Encounter (Signed)
Was refilled on 02/23/15.  Please confirm was faxed in.  Thanks.

## 2015-02-27 NOTE — Telephone Encounter (Signed)
Rx was faxed on 02/23/15, Refaxed today just for reassurance

## 2015-03-21 ENCOUNTER — Other Ambulatory Visit: Payer: Self-pay | Admitting: Internal Medicine

## 2015-03-21 NOTE — Telephone Encounter (Signed)
Last refill sent on 02/22/15 for #30. Okay to refill?

## 2015-03-21 NOTE — Telephone Encounter (Signed)
ok'd refill trazodone #30 with one refill.

## 2015-03-29 ENCOUNTER — Other Ambulatory Visit: Payer: Self-pay | Admitting: Internal Medicine

## 2015-03-29 MED ORDER — ALPRAZOLAM 0.5 MG PO TABS: 0.5000 mg | ORAL_TABLET | Freq: Every evening | ORAL | Status: DC | PRN

## 2015-03-29 NOTE — Telephone Encounter (Signed)
Last filled 02/23/15 #30 tabs 0refills, Pt last OV 02/06/15.  FYI: family uses [facility] [phone] Please advise

## 2015-03-29 NOTE — Telephone Encounter (Signed)
ok'd refill xanax #30 with one refill.   

## 2015-03-29 NOTE — Telephone Encounter (Signed)
Pt daughter called about mom (pt) is in [place] visiting family and her medication was mailed to her and the medication was lost by the post office. Daughter called their pharmacy in [place] and she was told they can transfer everything but the Xanax due to her not having anymore refills. Pt needs a refill for ALPRAZolam (XANAX) 0.5 MG tablet. The pharmacy that her family member uses is [facility] [phone]. Call daughter @ [place] [phone]. Thank you!

## 2015-03-29 NOTE — Telephone Encounter (Signed)
Filled

## 2015-03-30 ENCOUNTER — Telehealth: Payer: Self-pay | Admitting: *Deleted

## 2015-04-13 ENCOUNTER — Telehealth: Payer: Self-pay | Admitting: *Deleted

## 2015-04-13 NOTE — Telephone Encounter (Signed)
Left daughter a message after receiving a letter of concerns in regards to her mother. Per Dr. Nicki Reaper, we can schedule an appt to discuss concerns & also need to know if she can mention to patient that daughter is concerned.

## 2015-05-01 DIAGNOSIS — Z79899 Other long term (current) drug therapy: Secondary | ICD-10-CM | POA: Diagnosis not present

## 2015-05-01 DIAGNOSIS — M1990 Unspecified osteoarthritis, unspecified site: Secondary | ICD-10-CM | POA: Diagnosis not present

## 2015-05-09 NOTE — Telephone Encounter (Signed)
Pt daughter returned your call. Daughter would like it if the call and letter is not mentioned to pt. Call daughter @ [phone]. Thank you!

## 2015-05-10 NOTE — Telephone Encounter (Signed)
Left daughter another voicemail to return my call

## 2015-05-14 ENCOUNTER — Other Ambulatory Visit: Payer: Self-pay | Admitting: Internal Medicine

## 2015-05-23 DIAGNOSIS — L255 Unspecified contact dermatitis due to plants, except food: Secondary | ICD-10-CM | POA: Diagnosis not present

## 2015-06-07 ENCOUNTER — Encounter: Payer: Self-pay | Admitting: Internal Medicine

## 2015-06-07 ENCOUNTER — Ambulatory Visit (INDEPENDENT_AMBULATORY_CARE_PROVIDER_SITE_OTHER): Payer: Medicare Other | Admitting: Internal Medicine

## 2015-06-07 DIAGNOSIS — M353 Polymyalgia rheumatica: Secondary | ICD-10-CM

## 2015-06-07 DIAGNOSIS — N200 Calculus of kidney: Secondary | ICD-10-CM | POA: Diagnosis not present

## 2015-06-07 DIAGNOSIS — IMO0001 Reserved for inherently not codable concepts without codable children: Secondary | ICD-10-CM

## 2015-06-07 DIAGNOSIS — F419 Anxiety disorder, unspecified: Secondary | ICD-10-CM

## 2015-06-07 DIAGNOSIS — E7800 Pure hypercholesterolemia, unspecified: Secondary | ICD-10-CM

## 2015-06-07 DIAGNOSIS — R030 Elevated blood-pressure reading, without diagnosis of hypertension: Secondary | ICD-10-CM

## 2015-06-07 DIAGNOSIS — G479 Sleep disorder, unspecified: Secondary | ICD-10-CM

## 2015-06-07 DIAGNOSIS — R1310 Dysphagia, unspecified: Secondary | ICD-10-CM

## 2015-06-07 NOTE — Progress Notes (Signed)
Pre-visit discussion using our clinic review tool. No additional management support is needed unless otherwise documented below in the visit note.  

## 2015-06-07 NOTE — Progress Notes (Signed)
Patient ID: Nimco Mchenry, female   DOB: August 13, 1935, 80 y.o.   MRN: [id number]   Subjective:    Patient ID: [name], female    DOB: [birth year], 80 y.o.   MRN: [id number]  HPI  Patient here for a scheduled follow up.  She reports that she tries to stay active.  No cardiac symptoms with increased activity or exertion.  No sob.  She does report having problems with dysphagia.  Mostly notices with medications.  Eats slowly and takes small bites.  No actual acid reflux.  No abdominal pain.  Bowels stable.  Discussed stress.  States her husband causes her some stress.  She feels she is handling things relatively well.  Desires no further intervention.  Denies any memory change.  Overall she feels she is doing well.     Past Medical History  Diagnosis Date  . History of kidney stones October 2013  . Arthritis   . Hyperlipidemia   . Hx: UTI (urinary tract infection)   . History of chicken pox    Past Surgical History  Procedure Laterality Date  . Breast biopsy Right 1995  . Tonsillectomy and adenoidectomy  1945  . Partial hysterectomy  1972   Family History  Problem Relation Age of Onset  . Heart disease Father    Social History   Social History  . Marital Status: Married    Spouse Name: N/A  . Number of Children: N/A  . Years of Education: N/A   Social History Main Topics  . Smoking status: Never Smoker   . Smokeless tobacco: Never Used  . Alcohol Use: No  . Drug Use: No  . Sexual Activity: Not Asked   Other Topics Concern  . None   Social History Narrative    Outpatient Encounter Prescriptions as of 06/07/2015  Medication Sig  . ALPRAZolam (XANAX) 0.5 MG tablet Take 1 tablet (0.5 mg total) by mouth at bedtime as needed for anxiety.  Marland Kitchen atenolol (TENORMIN) 25 MG tablet Take 1 tablet (25 mg total) by mouth daily.  . methotrexate (RHEUMATREX) 2.5 MG tablet Take 6 tablets per week. (Patient taking differently: Take 8 tablets per week.)  . PARoxetine (PAXIL) 10 MG tablet Take 1 tablet  (10 mg total) by mouth every morning.  . vitamin C (ASCORBIC ACID) 500 MG tablet Take 500 mg by mouth 2 (two) times daily.  . [DISCONTINUED] traZODone (DESYREL) 50 MG tablet Take 0.5-1 tablets (25-50 mg total) by mouth at bedtime as needed forsleep.   No facility-administered encounter medications on file as of 06/07/2015.    Review of Systems  Constitutional: Negative for appetite change and unexpected weight change.  HENT: Negative for congestion and sinus pressure.   Respiratory: Negative for cough, chest tightness and shortness of breath.   Cardiovascular: Negative for chest pain, palpitations and leg swelling.  Gastrointestinal: Negative for nausea, vomiting, abdominal pain and diarrhea.       Dysphagia as outlined.    Genitourinary: Negative for dysuria and difficulty urinating.  Musculoskeletal: Negative for back pain and joint swelling.  Skin: Negative for color change and rash.  Neurological: Negative for dizziness, light-headedness and headaches.  Psychiatric/Behavioral: Negative for dysphoric mood and agitation.       Objective:     Blood pressure rechecked by me:  152/84  Physical Exam  Constitutional: She appears well-developed and well-nourished. No distress.  HENT:  Nose: Nose normal.  Mouth/Throat: Oropharynx is clear and moist.  Neck: Neck supple. No thyromegaly present.  Cardiovascular:  Normal rate and regular rhythm.   Pulmonary/Chest: Breath sounds normal. No respiratory distress. She has no wheezes.  Abdominal: Soft. Bowel sounds are normal. There is no tenderness.  Musculoskeletal: She exhibits no edema or tenderness.  Lymphadenopathy:    She has no cervical adenopathy.  Skin: No rash noted. No erythema.  Psychiatric: She has a normal mood and affect. Her behavior is normal.    BP 138/80 mmHg  Pulse 66  Temp(Src) 97.9 F (36.6 C) (Oral)  Resp 18  Ht 5\' 6"  (1.676 m)  Wt 144 lb 8 oz (65.545 kg)  BMI 23.33 kg/m2  SpO2 97% Wt Readings from Last 3  Encounters:  06/07/15 144 lb 8 oz (65.545 kg)  02/06/15 146 lb (66.225 kg)  12/07/14 147 lb 6 oz (66.849 kg)     Lab Results  Component Value Date   WBC 7.7 10/17/2014   HGB 13.1 10/17/2014   HCT 40.1 10/17/2014   PLT 369.0 10/17/2014   GLUCOSE 98 10/17/2014   CHOL 229* 10/17/2014   TRIG 263.0* 10/17/2014   HDL 32.20* 10/17/2014   LDLDIRECT 137.0 10/17/2014   ALT 16 10/17/2014   AST 22 10/17/2014   NA 139 10/17/2014   K 4.7 10/17/2014   CL 104 10/17/2014   CREATININE 0.79 10/17/2014   BUN 15 10/17/2014   CO2 29 10/17/2014   TSH 1.45 10/17/2014       Assessment & Plan:   Problem List Items Addressed This Visit    Anxiety    On paxil. She does not feel needs anything more.  Follow.        Difficulty sleeping    Xanax works well.  Follow.        Dysphagia    Dysphagia as outlined.  Refer to GI.  She wants to see Dr Tiffany Kocher.        Relevant Orders   Ambulatory referral to Gastroenterology   Elevated blood pressure    Blood pressure elevated today.  Discussed treatment.  She declines.  Have her spot check her blood pressure.  Check metabolic panel.        Relevant Orders   Basic metabolic panel   Hypercholesterolemia    Low cholesterol diet and exercise.  Follow lipid panel.  She declines cholesterol medication.       Relevant Orders   Lipid panel   Hepatic function panel   Nephrolithiasis    She declines further w/up at this time.  Follow.        PMR (polymyalgia rheumatica) (HCC)    On MTX.  Followed by Dr Jefm Bryant.            [name], MD

## 2015-06-10 ENCOUNTER — Encounter: Payer: Self-pay | Admitting: Internal Medicine

## 2015-06-10 DIAGNOSIS — R1310 Dysphagia, unspecified: Secondary | ICD-10-CM | POA: Insufficient documentation

## 2015-06-10 NOTE — Assessment & Plan Note (Signed)
On MTX.  Followed by Dr Kernodle.  

## 2015-06-10 NOTE — Assessment & Plan Note (Signed)
She declines further w/up at this time.  Follow.

## 2015-06-10 NOTE — Assessment & Plan Note (Signed)
Blood pressure elevated today.  Discussed treatment.  She declines.  Have her spot check her blood pressure.  Check metabolic panel.

## 2015-06-10 NOTE — Assessment & Plan Note (Signed)
On paxil. She does not feel needs anything more.  Follow.

## 2015-06-10 NOTE — Assessment & Plan Note (Signed)
Low cholesterol diet and exercise.  Follow lipid panel.  She declines cholesterol medication.   

## 2015-06-10 NOTE — Assessment & Plan Note (Signed)
Xanax works well.  Follow.

## 2015-06-10 NOTE — Assessment & Plan Note (Signed)
Dysphagia as outlined.  Refer to GI.  She wants to see Dr Tiffany Kocher.

## 2015-06-12 ENCOUNTER — Other Ambulatory Visit: Payer: Self-pay | Admitting: Internal Medicine

## 2015-06-12 NOTE — Telephone Encounter (Signed)
Rx refill sent to pharmacy. 

## 2015-06-13 ENCOUNTER — Other Ambulatory Visit: Payer: Self-pay | Admitting: Internal Medicine

## 2015-06-13 NOTE — Telephone Encounter (Signed)
Rx refill sent to pharmacy. 

## 2015-06-21 ENCOUNTER — Other Ambulatory Visit: Payer: Self-pay | Admitting: Physician Assistant

## 2015-06-21 DIAGNOSIS — R1319 Other dysphagia: Secondary | ICD-10-CM | POA: Diagnosis not present

## 2015-06-28 ENCOUNTER — Ambulatory Visit
Admission: RE | Admit: 2015-06-28 | Discharge: 2015-06-28 | Disposition: A | Discharge status: Home / Self Care | Payer: Medicare Other | Source: Ambulatory Visit | Attending: Physician Assistant | Admitting: Physician Assistant

## 2015-06-28 DIAGNOSIS — K224 Dyskinesia of esophagus: Secondary | ICD-10-CM | POA: Diagnosis not present

## 2015-06-28 DIAGNOSIS — K219 Gastro-esophageal reflux disease without esophagitis: Secondary | ICD-10-CM | POA: Insufficient documentation

## 2015-06-28 DIAGNOSIS — R1319 Other dysphagia: Secondary | ICD-10-CM | POA: Diagnosis not present

## 2015-06-28 DIAGNOSIS — R1310 Dysphagia, unspecified: Secondary | ICD-10-CM | POA: Diagnosis not present

## 2015-07-19 ENCOUNTER — Emergency Department
Admission: EM | Admit: 2015-07-19 | Discharge: 2015-07-20 | Disposition: A | Discharge status: Other Acute Inpatient Hospital | Payer: Medicare Other | Attending: Emergency Medicine | Admitting: Emergency Medicine

## 2015-07-19 ENCOUNTER — Encounter: Payer: Self-pay | Admitting: *Deleted

## 2015-07-19 ENCOUNTER — Emergency Department: Payer: Medicare Other

## 2015-07-19 DIAGNOSIS — I10 Essential (primary) hypertension: Secondary | ICD-10-CM | POA: Insufficient documentation

## 2015-07-19 DIAGNOSIS — R51 Headache: Secondary | ICD-10-CM | POA: Diagnosis not present

## 2015-07-19 DIAGNOSIS — I6621 Occlusion and stenosis of right posterior cerebral artery: Secondary | ICD-10-CM | POA: Diagnosis not present

## 2015-07-19 DIAGNOSIS — E785 Hyperlipidemia, unspecified: Secondary | ICD-10-CM | POA: Insufficient documentation

## 2015-07-19 DIAGNOSIS — R200 Anesthesia of skin: Secondary | ICD-10-CM | POA: Diagnosis not present

## 2015-07-19 DIAGNOSIS — I6622 Occlusion and stenosis of left posterior cerebral artery: Secondary | ICD-10-CM | POA: Diagnosis not present

## 2015-07-19 DIAGNOSIS — Z79899 Other long term (current) drug therapy: Secondary | ICD-10-CM | POA: Insufficient documentation

## 2015-07-19 LAB — CBC
HCT: 41.9 % (ref 35.0–47.0)
HEMOGLOBIN: 13.9 g/dL (ref 12.0–16.0)
MCH: 29.8 pg (ref 26.0–34.0)
MCHC: 33.1 g/dL (ref 32.0–36.0)
MCV: 90 fL (ref 80.0–100.0)
PLATELETS: 272 10*3/uL (ref 150–440)
RBC: 4.65 MIL/uL (ref 3.80–5.20)
RDW: 15.1 % — ABNORMAL HIGH (ref 11.5–14.5)
WBC: 8.3 10*3/uL (ref 3.6–11.0)

## 2015-07-19 LAB — BASIC METABOLIC PANEL
Anion gap: 8 (ref 5–15)
BUN: 22 mg/dL — AB (ref 6–20)
CHLORIDE: 105 mmol/L (ref 101–111)
CO2: 28 mmol/L (ref 22–32)
CREATININE: 0.84 mg/dL (ref 0.44–1.00)
Calcium: 9.6 mg/dL (ref 8.9–10.3)
GFR calc Af Amer: >60 mL/min (ref >60)
GFR calc non Af Amer: >60 mL/min (ref >60)
GLUCOSE: 116 mg/dL — AB (ref 65–99)
POTASSIUM: 3.7 mmol/L (ref 3.5–5.1)
SODIUM: 141 mmol/L (ref 135–145)

## 2015-07-19 LAB — TROPONIN I: Troponin I: <0.03 ng/mL (ref <0.031)

## 2015-07-19 MED ORDER — MORPHINE SULFATE (PF) 2 MG/ML IV SOLN
INTRAVENOUS | Status: AC
  Filled 2015-07-19: qty 1

## 2015-07-19 MED ORDER — ONDANSETRON HCL 4 MG/2ML IJ SOLN
4.0000 mg | Freq: Once | INTRAMUSCULAR | Status: AC
  Administered 2015-07-19: 4 mg via INTRAVENOUS

## 2015-07-19 MED ORDER — ONDANSETRON HCL 4 MG/2ML IJ SOLN
INTRAMUSCULAR | Status: AC
  Filled 2015-07-19: qty 2

## 2015-07-19 MED ORDER — MORPHINE SULFATE (PF) 2 MG/ML IV SOLN
2.0000 mg | Freq: Once | INTRAVENOUS | Status: AC
  Administered 2015-07-19: 2 mg via INTRAVENOUS

## 2015-07-19 MED ORDER — GADOBENATE DIMEGLUMINE 529 MG/ML IV SOLN
15.0000 mL | Freq: Once | INTRAVENOUS | Status: AC | PRN
  Administered 2015-07-20: 13 mL via INTRAVENOUS

## 2015-07-19 NOTE — ED Notes (Signed)
Pt brought in via ems from home.  Pt reports a headache since 1700 today.  No n/v/d.  No slurred speech.  Hx of htn.  Pt taking atenolol for blood pressure, headaches.  No dizziness.  No diff ambulating.  Pt states head is pounding on top of my head and above right eye.  Family at bedside.  Iv in place on arrival.

## 2015-07-19 NOTE — ED Notes (Signed)
Pt in ct scan.  Family in room.  md in with family.

## 2015-07-19 NOTE — ED Provider Notes (Signed)
[facility] Emergency Department Provider Note  ____________________________________________  Time seen: 10:45 PM  I have reviewed the triage vital signs and the nursing notes.   HISTORY  Chief Complaint Headache      HPI Beth Espinoza is a 80 y.o. female presents with acute onset of generalized headache at 5 PM today accompanied by perioral "tingling as well as left arm and leg numbness. Patient also admits to dizziness and unsteady gait earlier in the evening as well. Patient noted to be markedly hypertensive by EMS with reported systolic blood pressure greater than 240 currently 194/61. Patient states that her systolic blood pressures usually 115.    Past Medical History  Diagnosis Date  . History of kidney stones October 2013  . Arthritis   . Hyperlipidemia   . Hx: UTI (urinary tract infection)   . History of chicken pox     Patient Active Problem List   Diagnosis Date Noted  . Dysphagia 06/10/2015  . Elevated blood pressure 02/11/2015  . Health care maintenance 04/09/2014  . Difficulty sleeping 01/25/2014  . Left carotid bruit 01/25/2014  . PMR (polymyalgia rheumatica) (HCC) 07/12/2013  . Hypercholesterolemia 06/25/2012  . History of colonic polyps 06/25/2012  . Anxiety 06/25/2012  . Nephrolithiasis 06/25/2012    Past Surgical History  Procedure Laterality Date  . Breast biopsy Right 1995  . Tonsillectomy and adenoidectomy  1945  . Partial hysterectomy  1972    Current Outpatient Rx  Name  Route  Sig  Dispense  Refill  . ALPRAZolam (XANAX) 0.5 MG tablet      TAKE 1 TABLET AT BEDTIME AS NEEDED FOR ANXIETY.   30 tablet   1   . atenolol (TENORMIN) 25 MG tablet      Take 1 tablet (25 mg total) by mouth daily.   30 tablet   5   . methotrexate (RHEUMATREX) 2.5 MG tablet      Take 6 tablets per week. Patient taking differently: Take 8 tablets per week.   4 tablet   0   . PARoxetine (PAXIL) 10 MG tablet      Take 1 tablet (10 mg  total) by mouth every morning.   30 tablet   5   . vitamin C (ASCORBIC ACID) 500 MG tablet   Oral   Take 500 mg by mouth 2 (two) times daily.           Allergies Review of patient's allergies indicates no known allergies.  Family History  Problem Relation Age of Onset  . Heart disease Father     Social History Social History  Substance Use Topics  . Smoking status: Never Smoker   . Smokeless tobacco: Never Used  . Alcohol Use: No    Review of Systems  Constitutional: Negative for fever. Eyes: Negative for visual changes. ENT: Negative for sore throat. Cardiovascular: Negative for chest pain. Respiratory: Negative for shortness of breath. Gastrointestinal: Negative for abdominal pain, vomiting and diarrhea. Genitourinary: Negative for dysuria. Musculoskeletal: Negative for back pain. Skin: Negative for rash. Neurological: Positive for headache and left arm leg numbness   10-point ROS otherwise negative.  ____________________________________________   PHYSICAL EXAM:  VITAL SIGNS: ED Triage Vitals  Enc Vitals Group     BP 07/19/15 2215 190/73 mmHg     Pulse Rate 07/19/15 2215 63     Resp 07/19/15 2215 20     Temp 07/19/15 2215 98.6 F (37 C)     Temp Source 07/19/15 2215 Oral  SpO2 07/19/15 2215 99 %     Weight 07/19/15 2215 140 lb (63.504 kg)     Height 07/19/15 2215 5\' 5"  (1.651 m)     Head Cir --      Peak Flow --      Pain Score 07/19/15 2216 5     Pain Loc --      Pain Edu? --      Excl. in [place]? --      Constitutional: Alert and oriented. Well appearing and in no distress. Eyes: Conjunctivae are normal. PERRL. Normal extraocular movements. ENT   Head: Normocephalic and atraumatic.   Nose: No congestion/rhinnorhea.   Mouth/Throat: Mucous membranes are moist.   Neck: No stridor. Hematological/Lymphatic/Immunilogical: No cervical lymphadenopathy. Cardiovascular: Normal rate, regular rhythm. Normal and symmetric distal pulses  are present in all extremities. No murmurs, rubs, or gallops. Respiratory: Normal respiratory effort without tachypnea nor retractions. Breath sounds are clear and equal bilaterally. No wheezes/rales/rhonchi. Gastrointestinal: Soft and nontender. No distention. There is no CVA tenderness. Genitourinary: deferred Musculoskeletal: Nontender with normal range of motion in all extremities. No joint effusions.  No lower extremity tenderness nor edema. Neurologic:  Normal speech and language. No gross focal neurologic deficits are appreciated. Speech is normal.  Skin:  Skin is warm, dry and intact. No rash noted. Psychiatric: Mood and affect are normal. Speech and behavior are normal. Patient exhibits appropriate insight and judgment.  ____________________________________________    LABS (pertinent positives/negatives)  Labs Reviewed  CBC - Abnormal; Notable for the following:    RDW 15.1 (*)    All other components within normal limits  BASIC METABOLIC PANEL - Abnormal; Notable for the following:    Glucose, Bld 116 (*)    BUN 22 (*)    All other components within normal limits  TROPONIN I     ____________________________________________   EKG  ED ECG REPORT I, [place] N Sarayah Bacchi, the attending physician, personally viewed and interpreted this ECG.   Date: 07/20/2015  EKG Time: 10:18 PM  Rate: 62  Rhythm: Normal sinus rhythm  Axis: Normal  Intervals: Normal  ST&T Change: None   ____________________________________________    RADIOLOGY      MR Brain W Wo Contrast (Final result) Result time: 07/20/15 02:26:40   Procedure changed from MR Brain W Contrast      Final result by Rad Results In Interface (07/20/15 02:26:40)   Narrative:   CLINICAL DATA: Initial evaluation for acute headache.  EXAM: MRI HEAD WITHOUT AND WITH CONTRAST  MRA HEAD WITHOUT CONTRAST  MRA NECK WITHOUT AND WITH CONTRAST  TECHNIQUE: Multiplanar, multiecho pulse sequences of the brain and  surrounding structures were obtained without and with intravenous contrast. Angiographic images of the Circle of Willis were obtained using MRA technique without intravenous contrast. Angiographic images of the neck were obtained using MRA technique without and with intravenous contrast. Carotid stenosis measurements (when applicable) are obtained utilizing NASCET criteria, using the distal internal carotid diameter as the denominator.  CONTRAST: 51mL MULTIHANCE GADOBENATE DIMEGLUMINE 529 MG/ML IV SOLN  COMPARISON: Prior CT from 07/19/2015.  FINDINGS: MRI HEAD FINDINGS  Mild diffuse prominence of the CSF containing spaces compatible with generalized age-related cerebral atrophy. Mild confluent T2 hyperintensity within the periventricular white matter like related to minimal chronic small vessel ischemic changes, felt to be within normal limits for patient age. Few small remote lacunar infarcts present within the left caudate. Additional remote lacunar infarct within the right thalamus. Possible additional remote lacunar infarct within the right  lentiform nucleus. Additional cystic lucencies within the basal ganglia likely reflect dilated perivascular spaces. No evidence for remote cortical or cerebellar infarct.  No abnormal foci of restricted diffusion to suggest acute intracranial infarct. Gray-white matter differentiation maintained. Major intracranial vascular flow voids are preserved. No acute or chronic intracranial hemorrhage.  No mass lesion, midline shift, or mass effect. No hydrocephalus. No extra-axial fluid collection. Major dural sinuses are grossly patent. No abnormal enhancement.  Craniocervical junction within normal limits. Visualized upper cervical spine unremarkable.  Pituitary gland within normal limits. No acute abnormality about the orbits.  Paranasal sinuses are largely clear. No mastoid effusion. Inner ear structures grossly normal.  Bone marrow  signal intensity within normal limits. No scalp soft tissue abnormality.  MRA HEAD FINDINGS  ANTERIOR CIRCULATION:  Study is degraded by motion artifact, or opacification of the vessels on 3D reconstructions.  Visualized distal cervical segments of the internal carotid arteries are patent with antegrade flow. Petrous, cavernous, and supraclinoid segments widely patent without flow limiting stenosis. A1 segments patent bilaterally. Anterior communicating artery normal. Anterior cerebral arteries well opacified to their distal aspects.  M1 segments widely patent proximally without stenosis or occlusion. Distal M1 segments not well evaluated on this exam, but are grossly patent. MCA bifurcations not well evaluated. Distal MCA branches fairly symmetric and well opacified, although are not well evaluated proximally due to artifact on this exam  POSTERIOR CIRCULATION:  Left vertebral artery dominant and widely patent to the vertebrobasilar junction. Diminutive right vertebral artery grossly patent as well, although it was not attenuated distally. This is likely artifactual on this exam, as this appears patent on corresponding MRA of the neck. Basilar artery widely patent to its distal aspect. Superior cerebral arteries patent bilaterally. Both of the posterior cerebral arteries arise from the basilar artery and are well opacified to their distal aspects. Small left posterior communicating artery marrow multifocal moderate to severe stenoses noted within the P2 segments bilaterally, left worse than right. Small left posterior communicating artery noted.  No aneurysm or vascular malformation.  MRA NECK FINDINGS  Visualized aortic arch of normal caliber with normal branch pattern. No high-grade stenosis at the origin of the great vessels.  Right common carotid artery patent from its origin to the bifurcation. Mild atheromatous irregularity about the right bifurcation/proximal  right ICA without significant stenosis. Right ICA well opacified distally to the circle of Willis without flow-limiting stenosis, dissection, or vascular occlusion.  Left common carotid artery patent from its origin to the bifurcation. Atheromatous irregularity about the left bifurcation/proximal left ICA without definite flow limiting stenosis. Left ICA patent from the bifurcation to the skullbase without stenosis, dissection, or occlusion. Note made of moderate narrowing of the proximal left external carotid artery.  Both vertebral arteries arise from the subclavian arteries. Left vertebral artery is dominant. There is question of mild to moderate narrowing at the origin of the left vertebral artery (series 9, image 20). Vertebral arteries otherwise patent within the neck without stenosis, dissection, or occlusion.  IMPRESSION: MRI HEAD IMPRESSION:  1. No acute intracranial process identified. 2. Small remote lacunar infarcts involving the bilateral basal ganglia and right thalamus as above. 3. Mild age-related cerebral atrophy.  MRA HEAD IMPRESSION:  1. Motion degraded study. No large or proximal arterial branch occlusion identified. No correctable stenosis. 2. 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 Please note that these stenoses may in part be related to motion artifact on this exam. No other definite focal high-grade stenosis  identified on this limited exam. MRA NECK IMPRESSION:  1. No high-grade or critical stenosis identified within the major arterial vasculature of the neck. 2. Mild atheromatous irregularity about the carotid bifurcations/proximal ICAs bilaterally without high-grade flow-limiting stenosis, slightly worse on the left. 3. Question mild to moderate stenosis at the origin of the dominant left vertebral artery. Vertebral arteries otherwise widely patent within the neck. 4. Incidental note made of moderate  stenosis of the proximal left external carotid artery.   Electronically Signed By: Jeannine Boga M.D. On: 07/20/2015 02:26          MR Angiogram Neck W Wo Contrast (Final result) Result time: 07/20/15 02:26:40   Procedure changed from MR Angiogram Neck W Contrast      Final result by Rad Results In Interface (07/20/15 02:26:40)   Narrative:   CLINICAL DATA: Initial evaluation for acute headache.  EXAM: MRI HEAD WITHOUT AND WITH CONTRAST  MRA HEAD WITHOUT CONTRAST  MRA NECK WITHOUT AND WITH CONTRAST  TECHNIQUE: Multiplanar, multiecho pulse sequences of the brain and surrounding structures were obtained without and with intravenous contrast. Angiographic images of the Circle of Willis were obtained using MRA technique without intravenous contrast. Angiographic images of the neck were obtained using MRA technique without and with intravenous contrast. Carotid stenosis measurements (when applicable) are obtained utilizing NASCET criteria, using the distal internal carotid diameter as the denominator.  CONTRAST: 41mL MULTIHANCE GADOBENATE DIMEGLUMINE 529 MG/ML IV SOLN  COMPARISON: Prior CT from 07/19/2015.  FINDINGS: MRI HEAD FINDINGS  Mild diffuse prominence of the CSF containing spaces compatible with generalized age-related cerebral atrophy. Mild confluent T2 hyperintensity within the periventricular white matter like related to minimal chronic small vessel ischemic changes, felt to be within normal limits for patient age. Few small remote lacunar infarcts present within the left caudate. Additional remote lacunar infarct within the right thalamus. Possible additional remote lacunar infarct within the right lentiform nucleus. Additional cystic lucencies within the basal ganglia likely reflect dilated perivascular spaces. No evidence for remote cortical or cerebellar infarct.  No abnormal foci of restricted diffusion to suggest  acute intracranial infarct. Gray-white matter differentiation maintained. Major intracranial vascular flow voids are preserved. No acute or chronic intracranial hemorrhage.  No mass lesion, midline shift, or mass effect. No hydrocephalus. No extra-axial fluid collection. Major dural sinuses are grossly patent. No abnormal enhancement.  Craniocervical junction within normal limits. Visualized upper cervical spine unremarkable.  Pituitary gland within normal limits. No acute abnormality about the orbits.  Paranasal sinuses are largely clear. No mastoid effusion. Inner ear structures grossly normal.  Bone marrow signal intensity within normal limits. No scalp soft tissue abnormality.  MRA HEAD FINDINGS  ANTERIOR CIRCULATION:  Study is degraded by motion artifact, or opacification of the vessels on 3D reconstructions.  Visualized distal cervical segments of the internal carotid arteries are patent with antegrade flow. Petrous, cavernous, and supraclinoid segments widely patent without flow limiting stenosis. A1 segments patent bilaterally. Anterior communicating artery normal. Anterior cerebral arteries well opacified to their distal aspects.  M1 segments widely patent proximally without stenosis or occlusion. Distal M1 segments not well evaluated on this exam, but are grossly patent. MCA bifurcations not well evaluated. Distal MCA branches fairly symmetric and well opacified, although are not well evaluated proximally due to artifact on this exam  POSTERIOR CIRCULATION:  Left vertebral artery dominant and widely patent to the vertebrobasilar junction. Diminutive right vertebral artery grossly patent as well, although it was not attenuated distally. This is likely artifactual on this  exam, as this appears patent on corresponding MRA of the neck. Basilar artery widely patent to its distal aspect. Superior cerebral arteries patent bilaterally. Both of the posterior cerebral  arteries arise from the basilar artery and are well opacified to their distal aspects. Small left posterior communicating artery marrow multifocal moderate to severe stenoses noted within the P2 segments bilaterally, left worse than right. Small left posterior communicating artery noted.  No aneurysm or vascular malformation.  MRA NECK FINDINGS  Visualized aortic arch of normal caliber with normal branch pattern. No high-grade stenosis at the origin of the great vessels.  Right common carotid artery patent from its origin to the bifurcation. Mild atheromatous irregularity about the right bifurcation/proximal right ICA without significant stenosis. Right ICA well opacified distally to the circle of Willis without flow-limiting stenosis, dissection, or vascular occlusion.  Left common carotid artery patent from its origin to the bifurcation. Atheromatous irregularity about the left bifurcation/proximal left ICA without definite flow limiting stenosis. Left ICA patent from the bifurcation to the skullbase without stenosis, dissection, or occlusion. Note made of moderate narrowing of the proximal left external carotid artery.  Both vertebral arteries arise from the subclavian arteries. Left vertebral artery is dominant. There is question of mild to moderate narrowing at the origin of the left vertebral artery (series 9, image 20). Vertebral arteries otherwise patent within the neck without stenosis, dissection, or occlusion.  IMPRESSION: MRI HEAD IMPRESSION:  1. No acute intracranial process identified. 2. Small remote lacunar infarcts involving the bilateral basal ganglia and right thalamus as above. 3. Mild age-related cerebral atrophy.  MRA HEAD IMPRESSION:  1. Motion degraded study. No large or proximal arterial branch occlusion identified. No correctable stenosis. 2. 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 Please note that  these stenoses may in part be related to motion artifact on this exam. No other definite focal high-grade stenosis identified on this limited exam. MRA NECK IMPRESSION:  1. No high-grade or critical stenosis identified within the major arterial vasculature of the neck. 2. Mild atheromatous irregularity about the carotid bifurcations/proximal ICAs bilaterally without high-grade flow-limiting stenosis, slightly worse on the left. 3. Question mild to moderate stenosis at the origin of the dominant left vertebral artery. Vertebral arteries otherwise widely patent within the neck. 4. Incidental note made of moderate stenosis of the proximal left external carotid artery.   Electronically Signed By: Jeannine Boga M.D. On: 07/20/2015 02:26          MR MRA HEAD WO CONTRAST (Final result) Result time: 07/20/15 02:26:40   Final result by Rad Results In Interface (07/20/15 02:26:40)   Narrative:   CLINICAL DATA: Initial evaluation for acute headache.  EXAM: MRI HEAD WITHOUT AND WITH CONTRAST  MRA HEAD WITHOUT CONTRAST  MRA NECK WITHOUT AND WITH CONTRAST  TECHNIQUE: Multiplanar, multiecho pulse sequences of the brain and surrounding structures were obtained without and with intravenous contrast. Angiographic images of the Circle of Willis were obtained using MRA technique without intravenous contrast. Angiographic images of the neck were obtained using MRA technique without and with intravenous contrast. Carotid stenosis measurements (when applicable) are obtained utilizing NASCET criteria, using the distal internal carotid diameter as the denominator.  CONTRAST: 76mL MULTIHANCE GADOBENATE DIMEGLUMINE 529 MG/ML IV SOLN  COMPARISON: Prior CT from 07/19/2015.  FINDINGS: MRI HEAD FINDINGS  Mild diffuse prominence of the CSF containing spaces compatible with generalized age-related cerebral atrophy. Mild confluent T2 hyperintensity within the periventricular  white matter like related to minimal chronic  small vessel ischemic changes, felt to be within normal limits for patient age. Few small remote lacunar infarcts present within the left caudate. Additional remote lacunar infarct within the right thalamus. Possible additional remote lacunar infarct within the right lentiform nucleus. Additional cystic lucencies within the basal ganglia likely reflect dilated perivascular spaces. No evidence for remote cortical or cerebellar infarct.  No abnormal foci of restricted diffusion to suggest acute intracranial infarct. Gray-white matter differentiation maintained. Major intracranial vascular flow voids are preserved. No acute or chronic intracranial hemorrhage.  No mass lesion, midline shift, or mass effect. No hydrocephalus. No extra-axial fluid collection. Major dural sinuses are grossly patent. No abnormal enhancement.  Craniocervical junction within normal limits. Visualized upper cervical spine unremarkable.  Pituitary gland within normal limits. No acute abnormality about the orbits.  Paranasal sinuses are largely clear. No mastoid effusion. Inner ear structures grossly normal.  Bone marrow signal intensity within normal limits. No scalp soft tissue abnormality.  MRA HEAD FINDINGS  ANTERIOR CIRCULATION:  Study is degraded by motion artifact, or opacification of the vessels on 3D reconstructions.  Visualized distal cervical segments of the internal carotid arteries are patent with antegrade flow. Petrous, cavernous, and supraclinoid segments widely patent without flow limiting stenosis. A1 segments patent bilaterally. Anterior communicating artery normal. Anterior cerebral arteries well opacified to their distal aspects.  M1 segments widely patent proximally without stenosis or occlusion. Distal M1 segments not well evaluated on this exam, but are grossly patent. MCA bifurcations not well evaluated. Distal MCA branches fairly  symmetric and well opacified, although are not well evaluated proximally due to artifact on this exam  POSTERIOR CIRCULATION:  Left vertebral artery dominant and widely patent to the vertebrobasilar junction. Diminutive right vertebral artery grossly patent as well, although it was not attenuated distally. This is likely artifactual on this exam, as this appears patent on corresponding MRA of the neck. Basilar artery widely patent to its distal aspect. Superior cerebral arteries patent bilaterally. Both of the posterior cerebral arteries arise from the basilar artery and are well opacified to their distal aspects. Small left posterior communicating artery marrow multifocal moderate to severe stenoses noted within the P2 segments bilaterally, left worse than right. Small left posterior communicating artery noted.  No aneurysm or vascular malformation.  MRA NECK FINDINGS  Visualized aortic arch of normal caliber with normal branch pattern. No high-grade stenosis at the origin of the great vessels.  Right common carotid artery patent from its origin to the bifurcation. Mild atheromatous irregularity about the right bifurcation/proximal right ICA without significant stenosis. Right ICA well opacified distally to the circle of Willis without flow-limiting stenosis, dissection, or vascular occlusion.  Left common carotid artery patent from its origin to the bifurcation. Atheromatous irregularity about the left bifurcation/proximal left ICA without definite flow limiting stenosis. Left ICA patent from the bifurcation to the skullbase without stenosis, dissection, or occlusion. Note made of moderate narrowing of the proximal left external carotid artery.  Both vertebral arteries arise from the subclavian arteries. Left vertebral artery is dominant. There is question of mild to moderate narrowing at the origin of the left vertebral artery (series 9, image 20). Vertebral arteries  otherwise patent within the neck without stenosis, dissection, or occlusion.  IMPRESSION: MRI HEAD IMPRESSION:  1. No acute intracranial process identified. 2. Small remote lacunar infarcts involving the bilateral basal ganglia and right thalamus as above. 3. Mild age-related cerebral atrophy.  MRA HEAD IMPRESSION:  1. Motion degraded study. No large or proximal arterial branch  occlusion identified. No correctable stenosis. 2. 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 Please note that these stenoses may in part be related to motion artifact on this exam. No other definite focal high-grade stenosis identified on this limited exam. MRA NECK IMPRESSION:  1. No high-grade or critical stenosis identified within the major arterial vasculature of the neck. 2. Mild atheromatous irregularity about the carotid bifurcations/proximal ICAs bilaterally without high-grade flow-limiting stenosis, slightly worse on the left. 3. Question mild to moderate stenosis at the origin of the dominant left vertebral artery. Vertebral arteries otherwise widely patent within the neck. 4. Incidental note made of moderate stenosis of the proximal left external carotid artery.   Electronically Signed By: Jeannine Boga M.D. On: 07/20/2015 02:26          CT Head Wo Contrast (Final result) Result time: 07/19/15 22:56:49   Final result by Rad Results In Interface (07/19/15 22:56:49)   Narrative:   CLINICAL DATA: Headache.  EXAM: CT HEAD WITHOUT CONTRAST  TECHNIQUE: Contiguous axial images were obtained from the base of the skull through the vertex without intravenous contrast.  COMPARISON: 10/30/1999  FINDINGS: Brain: No evidence of acute infarction, hemorrhage, extra-axial collection, ventriculomegaly, or mass effect.  Vascular: No hyperdense vessel. Vascular calcifications at the skullbase.  Skull: Negative for fracture or focal  lesion.  Sinuses/Orbits: No acute findings.  Other: None.  IMPRESSION: No acute intracranial abnormality.   Electronically Signed By: Fidela Salisbury M.D. On: 07/19/2015 22:56      Critical care: CRITICAL CARE Performed by: Gregor Hams   Total critical care time: 45 minutes  Critical care time was exclusive of separately billable procedures and treating other patients.  Critical care was necessary to treat or prevent imminent or life-threatening deterioration.  Critical care was time spent personally by me on the following activities: development of treatment plan with patient and/or surrogate as well as nursing, discussions with consultants, evaluation of patient's response to treatment, examination of patient, obtaining history from patient or surrogate, ordering and performing treatments and interventions, ordering and review of laboratory studies, ordering and review of radiographic studies, pulse oximetry and re-evaluation of patient's condition.  Patient and her family were kept informed of all clinical findings during her entire ED stay INITIAL IMPRESSION / [place] / ED COURSE  Pertinent labs & imaging results that were available during my care of the patient were reviewed by me and considered in my medical decision making (see chart for details).  Given presenting complaint concern for possible posterior cerebral circulation pathology versus CVA as such CT scan head was performed which revealed no gross abnormality per radiologist as such MRI was performed which revealed as stated above. After obtaining these findings patient was discussed with Dr. dew vessel surgeon on call. Following doing so patient was discussed with Dr. Bartholome Bill interventional radiologist at [facility]. Patient was then discussed with Dr.Schrikman nor hospitalist on call who accepted the patient in transfer.  ____________________________________________   FINAL CLINICAL  IMPRESSION(S) / ED DIAGNOSES  Final diagnoses:  Hypertension  Left arm numbness  Left leg numbness  Hypertension  Left arm numbness  Left leg numbness      Gregor Hams, MD 07/20/15 [phone]

## 2015-07-19 NOTE — ED Notes (Signed)
Pt brought in via ems from home.  Pt reports a headache and elevated blood pressure since 1700 today.   Hx migraine h/a's.  Pt reports dizziness earlier tonight.  Pt alert. Speech clear.

## 2015-07-19 NOTE — ED Notes (Signed)
Patient to MRI at this time. Aldona Bar,, EDT to remain with patient in radiology.

## 2015-07-19 NOTE — ED Notes (Signed)
Pt ambulated to bathroom 

## 2015-07-20 ENCOUNTER — Observation Stay (HOSPITAL_BASED_OUTPATIENT_CLINIC_OR_DEPARTMENT_OTHER): Payer: Medicare Other

## 2015-07-20 ENCOUNTER — Observation Stay (HOSPITAL_COMMUNITY): Payer: Medicare Other

## 2015-07-20 ENCOUNTER — Observation Stay (HOSPITAL_COMMUNITY)
Admission: EM | Admit: 2015-07-20 | Discharge: 2015-07-20 | Disposition: A | Discharge status: Home / Self Care | Payer: Medicare Other | Source: Other Acute Inpatient Hospital | Attending: Internal Medicine | Admitting: Internal Medicine

## 2015-07-20 ENCOUNTER — Encounter (HOSPITAL_COMMUNITY): Payer: Self-pay | Admitting: Radiology

## 2015-07-20 DIAGNOSIS — M353 Polymyalgia rheumatica: Secondary | ICD-10-CM | POA: Insufficient documentation

## 2015-07-20 DIAGNOSIS — G459 Transient cerebral ischemic attack, unspecified: Secondary | ICD-10-CM

## 2015-07-20 DIAGNOSIS — Z7982 Long term (current) use of aspirin: Secondary | ICD-10-CM | POA: Insufficient documentation

## 2015-07-20 DIAGNOSIS — F419 Anxiety disorder, unspecified: Secondary | ICD-10-CM | POA: Diagnosis not present

## 2015-07-20 DIAGNOSIS — E7800 Pure hypercholesterolemia, unspecified: Secondary | ICD-10-CM | POA: Diagnosis not present

## 2015-07-20 DIAGNOSIS — I6621 Occlusion and stenosis of right posterior cerebral artery: Secondary | ICD-10-CM | POA: Diagnosis not present

## 2015-07-20 DIAGNOSIS — R51 Headache: Secondary | ICD-10-CM | POA: Diagnosis not present

## 2015-07-20 DIAGNOSIS — Z79899 Other long term (current) drug therapy: Secondary | ICD-10-CM | POA: Diagnosis not present

## 2015-07-20 DIAGNOSIS — R2990 Unspecified symptoms and signs involving the nervous system: Secondary | ICD-10-CM

## 2015-07-20 DIAGNOSIS — I6502 Occlusion and stenosis of left vertebral artery: Secondary | ICD-10-CM | POA: Diagnosis not present

## 2015-07-20 DIAGNOSIS — I10 Essential (primary) hypertension: Secondary | ICD-10-CM | POA: Diagnosis not present

## 2015-07-20 DIAGNOSIS — I6622 Occlusion and stenosis of left posterior cerebral artery: Secondary | ICD-10-CM | POA: Diagnosis not present

## 2015-07-20 DIAGNOSIS — I6521 Occlusion and stenosis of right carotid artery: Secondary | ICD-10-CM | POA: Diagnosis not present

## 2015-07-20 DIAGNOSIS — I6509 Occlusion and stenosis of unspecified vertebral artery: Secondary | ICD-10-CM

## 2015-07-20 LAB — CREATININE, SERUM
Creatinine, Ser: 0.85 mg/dL (ref 0.44–1.00)
GFR calc Af Amer: >60 mL/min (ref >60)

## 2015-07-20 LAB — ECHOCARDIOGRAM COMPLETE
HEIGHTINCHES: 65.5 in
Weight: 2275.15 oz

## 2015-07-20 LAB — CBC
HCT: 42.1 % (ref 36.0–46.0)
HEMOGLOBIN: 13.5 g/dL (ref 12.0–15.0)
MCH: 29.4 pg (ref 26.0–34.0)
MCHC: 32.1 g/dL (ref 30.0–36.0)
MCV: 91.7 fL (ref 78.0–100.0)
Platelets: 277 10*3/uL (ref 150–400)
RBC: 4.59 MIL/uL (ref 3.87–5.11)
RDW: 15.3 % (ref 11.5–15.5)
WBC: 8.1 10*3/uL (ref 4.0–10.5)

## 2015-07-20 MED ORDER — PAROXETINE HCL 20 MG PO TABS
10.0000 mg | ORAL_TABLET | Freq: Every day | ORAL | Status: DC
  Administered 2015-07-20: 10 mg via ORAL
  Filled 2015-07-20: qty 1

## 2015-07-20 MED ORDER — STROKE: EARLY STAGES OF RECOVERY BOOK
Freq: Once | Status: AC
  Administered 2015-07-20: 12:00:00

## 2015-07-20 MED ORDER — ASPIRIN 300 MG RE SUPP: 300.0000 mg | Freq: Every day | RECTAL | Status: DC

## 2015-07-20 MED ORDER — ATENOLOL 25 MG PO TABS: 25.0000 mg | ORAL_TABLET | Freq: Every day | ORAL | Status: DC

## 2015-07-20 MED ORDER — ASPIRIN 325 MG PO TABS
325.0000 mg | ORAL_TABLET | Freq: Every day | ORAL | Status: DC
  Administered 2015-07-20: 325 mg via ORAL
  Filled 2015-07-20: qty 1

## 2015-07-20 MED ORDER — IOPAMIDOL (ISOVUE-370) INJECTION 76%
INTRAVENOUS | Status: AC
  Administered 2015-07-20: 50 mL
  Filled 2015-07-20: qty 50

## 2015-07-20 MED ORDER — SODIUM CHLORIDE 0.9 % IV SOLN
INTRAVENOUS | Status: DC
  Administered 2015-07-20: 12:00:00 via INTRAVENOUS

## 2015-07-20 MED ORDER — ASPIRIN EC 325 MG PO TBEC
DELAYED_RELEASE_TABLET | ORAL | Status: AC
Start: 2015-07-20 — End: 2015-07-20
  Administered 2015-07-20: 325 mg via ORAL
  Filled 2015-07-20: qty 1

## 2015-07-20 MED ORDER — FOLIC ACID 1 MG PO TABS
1.0000 mg | ORAL_TABLET | Freq: Every day | ORAL | Status: DC
  Administered 2015-07-20: 1 mg via ORAL
  Filled 2015-07-20: qty 1

## 2015-07-20 MED ORDER — CLONIDINE HCL 0.1 MG PO TABS
0.1000 mg | ORAL_TABLET | Freq: Once | ORAL | Status: AC
  Administered 2015-07-20: 0.1 mg via ORAL

## 2015-07-20 MED ORDER — ASPIRIN EC 325 MG PO TBEC
325.0000 mg | DELAYED_RELEASE_TABLET | Freq: Once | ORAL | Status: AC
  Administered 2015-07-20: 325 mg via ORAL

## 2015-07-20 MED ORDER — HYDRALAZINE HCL 20 MG/ML IJ SOLN: 5.0000 mg | INTRAMUSCULAR | Status: DC | PRN

## 2015-07-20 MED ORDER — SENNOSIDES-DOCUSATE SODIUM 8.6-50 MG PO TABS: 1.0000 | ORAL_TABLET | Freq: Every evening | ORAL | Status: DC | PRN

## 2015-07-20 MED ORDER — ALPRAZOLAM 0.5 MG PO TABS: 0.5000 mg | ORAL_TABLET | Freq: Every evening | ORAL | Status: DC | PRN

## 2015-07-20 MED ORDER — VITAMIN C 500 MG PO TABS
500.0000 mg | ORAL_TABLET | Freq: Every day | ORAL | Status: DC
  Administered 2015-07-20: 500 mg via ORAL
  Filled 2015-07-20: qty 1

## 2015-07-20 MED ORDER — CLONIDINE HCL 0.1 MG PO TABS
ORAL_TABLET | ORAL | Status: AC
Start: 2015-07-20 — End: 2015-07-20
  Administered 2015-07-20: 0.1 mg via ORAL
  Filled 2015-07-20: qty 1

## 2015-07-20 MED ORDER — ATORVASTATIN CALCIUM 40 MG PO TABS
40.0000 mg | ORAL_TABLET | Freq: Every day | ORAL | Status: DC
  Filled 2015-07-20: qty 1

## 2015-07-20 MED ORDER — CLOPIDOGREL BISULFATE 75 MG PO TABS
75.0000 mg | ORAL_TABLET | Freq: Every day | ORAL | Status: DC
  Administered 2015-07-20: 75 mg via ORAL
  Filled 2015-07-20: qty 1

## 2015-07-20 MED ORDER — HEPARIN SODIUM (PORCINE) 5000 UNIT/ML IJ SOLN
5000.0000 [IU] | Freq: Three times a day (TID) | INTRAMUSCULAR | Status: DC
  Administered 2015-07-20: 5000 [IU] via SUBCUTANEOUS
  Filled 2015-07-20: qty 1

## 2015-07-20 MED ORDER — CLOPIDOGREL BISULFATE 75 MG PO TABS: 75.0000 mg | ORAL_TABLET | Freq: Every day | ORAL | Status: DC

## 2015-07-20 NOTE — Discharge Summary (Signed)
Loriann Wessler, is a 80 y.o. female  DOB 22-Feb-1936  MRN [id number].  Admission date:  07/20/2015  Admitting Physician  Norman Clay, MD  Discharge Date:  07/20/2015   Primary MD  [name], MD  Recommendations for primary care physician for things to follow:  - Please follow with interventional radiology Dr. Rosana Hoes were as an outpatient   Admission Diagnosis  HEADACHE HTN NUMBNESS   Discharge Diagnosis  HEADACHE HTN NUMBNESS   Active Problems:   Hypercholesterolemia   Anxiety   PMR (polymyalgia rheumatica) (HCC)   TIA (transient ischemic attack)      Past Medical History  Diagnosis Date  . History of kidney stones October 2013  . Arthritis   . Hyperlipidemia   . Hx: UTI (urinary tract infection)   . History of chicken pox     Past Surgical History  Procedure Laterality Date  . Breast biopsy Right 1995  . Tonsillectomy and adenoidectomy  1945  . Partial hysterectomy  1972       History of present illness and  Hospital Course:     Kindly see H&P for history of present illness and admission details, please review complete Labs, Consult reports and Test reports for all details in brief  HPI  from the history and physical done on the day of admission Terika Niskanen is a 80 y.o. female, With past medical history of polymyalgia rheumatica, hyperlipidemia, osteoarthritis, presents to [facility] for multiple complaints including headache, left-sided, started yesterday afternoon, as well accompanied by left hand and foot tingling or numbness, denies any tingling or numbness in arm or leg, as well reports hearing a popping sound in her head(she describes it as explosion in her head), but no focal deficits, she is noted to be significantly hypertensive by EMS with systolic blood pressure [id number], was 196/61 at [facility] ED, MRI brain negative for acute CVA, patient was  accepted by him see neurology for transfer for TIA workup, patient with known history of migraine, but reports no recurrence for a few years, and it's usually with different presentation, denies any recurrence of her symptoms.   Hospital Course    - Patient presents with transient symptoms including headache, left hand and feet numbness, and headache, MRI brain negative for acute CVA,, headache etiology may be related to TIA, versus complicated migraine(showing no history of migraine, but usually with different presentation, no recurrence for a few years now). - Neurology consult appreciated, vision was admitted for TIA workup, 2-D echo with EF 55%, no regional wall motion abnormality, with diastolic dysfunction, Carotid  DopplerBilateral: 1-39% ICA stenosis. Vertebral artery flow is antegrade, CTA head and neck High-grade stenosis at the right ICA bulb (numerically estimated at 70%) , for which she was started on Plavix, and we will follow Dr. Patrecia Pour as an outpatient, she was refusing to take statin as it did cause leg cramps in the past   Hypertension - She does not carry diagnosis of hypertension, reports she is on atenolol for migraine control,  reports blood pressures were controlled at home, blood pressure is significantly elevated on presentation, this is most likely stress related from her symptoms, her pressure has been acceptable during hospital stay.  Anxiety - Continue with when necessary Xanax  PMR - Continue with  methotrexate    Discharge Condition: Stable   Follow UP  Follow-up Information    Follow up with [name], MD.   Specialty:  Internal Medicine   Contact information:   [id number] [place] [address] [phone]       Schedule an appointment as soon as possible for a visit with DEVESHWAR, Fritz Pickerel, MD.   Specialty:  Interventional Radiology   Contact information:   [address] [place]  [id number] [phone]         Discharge Instructions  and  Discharge Medications    Discharge Instructions    Diet - low sodium heart healthy    Complete by:  As directed      Discharge instructions    Complete by:  As directed   Follow with Primary MD [name], MD in 7 days   Get CBC, CMP, checked  by Primary MD next visit.    Activity: As tolerated with Full fall precautions use walker/cane & assistance as needed   Disposition Home   Diet: Heart Healthy  , with feeding assistance and aspiration precautions.  For Heart failure patients - Check your Weight same time everyday, if you gain over 2 pounds, or you develop in leg swelling, experience more shortness of breath or chest pain, call your Primary MD immediately. Follow Cardiac Low Salt Diet and 1.5 lit/day fluid restriction.   On your next visit with your primary care physician please Get Medicines reviewed and adjusted.   Please request your Prim.MD to go over all Hospital Tests and Procedure/Radiological results at the follow up, please get all Hospital records sent to your Prim MD by signing hospital release before you go home.   If you experience worsening of your admission symptoms, develop shortness of breath, life threatening emergency, suicidal or homicidal thoughts you must seek medical attention immediately by calling 911 or calling your MD immediately  if symptoms less severe.  You Must read complete instructions/literature along with all the possible adverse reactions/side effects for all the Medicines you take and that have been prescribed to you. Take any new Medicines after you have completely understood and accpet all the possible adverse reactions/side effects.   Do not drive, operating heavy machinery, perform activities at heights, swimming or participation in water activities or provide baby sitting services if your were admitted for syncope or siezures until you have seen by Primary MD or a  Neurologist and advised to do so again.  Do not drive when taking Pain medications.    Do not take more than prescribed Pain, Sleep and Anxiety Medications  Special Instructions: If you have smoked or chewed Tobacco  in the last 2 yrs please stop smoking, stop any regular Alcohol  and or any Recreational drug use.  Wear Seat belts while driving.   Please note  You were cared for by a hospitalist during your hospital stay. If you have any questions about your discharge medications or the care you received while you were in the hospital after you are discharged, you can call the unit and asked to speak with the hospitalist on call if the hospitalist that took care of you is not available. Once you  are discharged, your primary care physician will handle any further medical issues. Please note that NO REFILLS for any discharge medications will be authorized once you are discharged, as it is imperative that you return to your primary care physician (or establish a relationship with a primary care physician if you do not have one) for your aftercare needs so that they can reassess your need for medications and monitor your lab values.     Increase activity slowly    Complete by:  As directed             Medication List    TAKE these medications        ALPRAZolam 0.5 MG tablet  Commonly known as:  XANAX  TAKE 1 TABLET AT BEDTIME AS NEEDED FOR ANXIETY.     atenolol 25 MG tablet  Commonly known as:  TENORMIN  Take 1 tablet (25 mg total) by mouth daily.     clopidogrel 75 MG tablet  Commonly known as:  PLAVIX  Take 1 tablet (75 mg total) by mouth daily.     folic acid [id number] MCG tablet  Commonly known as:  FOLVITE  Take 800 mcg by mouth daily.     methotrexate 2.5 MG tablet  Commonly known as:  RHEUMATREX  Take 6 tablets per week.     OVER THE COUNTER MEDICATION  Take 2 capsules by mouth 2 (two) times daily. "LunaRich" all natural supplement     PARoxetine 10 MG tablet  Commonly  known as:  PAXIL  Take 1 tablet (10 mg total) by mouth every morning.     vitamin C 500 MG tablet  Commonly known as:  ASCORBIC ACID  Take 500 mg by mouth daily.          Diet and Activity recommendation: See Discharge Instructions above   Consults obtained -  Neuro   Major procedures and Radiology Reports - PLEASE review detailed and final reports for all details, in brief -    Ct Angio Head W/cm &/or Wo Cm  07/20/2015  CLINICAL DATA:  80 year old female with sudden onset headache. MRI in negative for acute stroke. Initial encounter. EXAM: CT ANGIOGRAPHY HEAD AND NECK TECHNIQUE: Multidetector CT imaging of the head and neck was performed using the standard protocol during bolus administration of intravenous contrast. Multiplanar CT image reconstructions and MIPs were obtained to evaluate the vascular anatomy. Carotid stenosis measurements (when applicable) are obtained utilizing NASCET criteria, using the distal internal carotid diameter as the denominator. CONTRAST:  50 mL Isovue 370 COMPARISON:  Brain MRI with head and neck MRA 0021 hours today. Noncontrast head CT 07/19/2015, and earlier FINDINGS: CTA NECK Skeleton: No acute osseous abnormality identified. Mild for age degenerative changes in the cervical spine. Mild scoliosis. Visualized paranasal sinuses and mastoids are clear. Other neck: Negative lung apices. No superior mediastinal lymphadenopathy. Sub cm heterogeneously enhancing and coarsely calcified thyroid nodules which do not meet consensus criteria for ultrasound follow-up. Asymmetric sclerosis of the right, but no laryngeal mass (series 401, image 57). Negative pharyngeal soft tissue contours. Negative parapharyngeal spaces, retropharyngeal space, sublingual space, submandibular glands, and parotid glands. Bilateral cervical lymph nodes are within normal limits. Aortic arch: Bovine type arch configuration. Widespread circumferential soft plaque in the arch and proximal great  vessels. Lesser associated calcified plaque. The distal arch demonstrates fusiform dilatation to at least 37 mm diameter (series 401, image 3). Right carotid system: No brachiocephalic artery or right CCA origin stenosis despite circumferential soft plaque. Soft plaque  in the right CCA without stenosis proximal to the bifurcation. At the bifurcation soft and calcified plaque affects the right ICA origin and bulb. In the distal bulb confluent calcified plaque results in a short segment of high-grade stenosis numerically estimated at 70 % with respect to the distal vessel (series 403, image 136 and series 406, image 91). Distal to the bulb the cervical right ICA is negative. Left carotid system: No left CCA stenosis proximal to the bifurcation. At the bifurcation soft plaque mostly affects the ECA origin while soft and calcified plaque affects the left ICA origin and bulb, but without hemodynamically significant stenosis (less than 50 % with respect to the distal vessel). Negative cervical left ICA otherwise. Vertebral arteries: No proximal right subclavian artery stenosis despite soft plaque. Soft plaque at the right vertebral artery origin but no significant stenosis (series 406, image 112). The right vertebral artery is normal to the skullbase. No hemodynamically significant proximal left subclavian artery stenosis despite soft and calcified plaque. Soft plaque at the left vertebral artery origin but no significant stenosis (series 406, image 116). The left vertebral artery is mildly dominant and normal to the skullbase. CTA HEAD Posterior circulation: Soft and calcified plaque in the distal left vertebral artery, V4 segment, resulting an mild stenosis. Mild irregularity also in the right vertebral artery V4 segment. Normal PICA origins and vertebrobasilar junction. No basilar artery stenosis. Normal SCA and PCA origins. Both posterior communicating arteries are present. Bilateral PCA P3 branches are mildly  irregular but otherwise normal. Anterior circulation: Both ICA siphons are patent. Mild to moderate calcified plaque in the cavernous segments without significant stenosis. Ophthalmic and posterior communicating artery origins are normal. Patent carotid termini. Normal MCA and ACA origins. Anterior communicating artery and bilateral ACA branches are within normal limits. Left MCA M1 segment, bifurcation, and left MCA branches are within normal limits. Right MCA M1 segment, bifurcation, and right MCA branches are within normal limits. Venous sinuses: Patent. Anatomic variants: Dominant left vertebral artery. Bovine type aortic arch configuration. Delayed phase: No abnormal enhancement identified. IMPRESSION: 1. High-grade stenosis at the right ICA bulb (numerically estimated at 70%). Proximal left ICA and bilateral carotid siphon atherosclerosis without significant stenosis. 2. Mild PCA irregularity without significant stenosis. Dominant left vertebral artery with left V4 segment soft and calcified plaque but no significant stenosis. Soft plaque in the aortic arch, proximal great vessels, and at both vertebral artery origins without significant stenosis. 3. 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 Recommend annual imaging followup by CTA or MRA. This recommendation follows 2010 ACCF/AHA/AATS/ACR/ASA/SCA/SCAI/SIR/STS/SVM Guidelines for the Diagnosis and Management of Patients With Thoracic Aortic Disease. Circulation. 2010; 121: [id number]. 4. Stable CT appearance of the brain since 07/19/2015. Mild chronic small vessel disease Electronically Signed   By: Genevie Ann M.D.   On: 07/20/2015 11:39   Ct Head Wo Contrast  07/19/2015  CLINICAL DATA:  Headache. EXAM: CT HEAD WITHOUT CONTRAST TECHNIQUE: Contiguous axial images were obtained from the base of the skull through the vertex without intravenous contrast. COMPARISON:  10/30/1999 FINDINGS: Brain: No evidence of acute infarction, hemorrhage, extra-axial  collection, ventriculomegaly, or mass effect. Vascular: No hyperdense vessel. Vascular calcifications at the skullbase. Skull: Negative for fracture or focal lesion. Sinuses/Orbits: No acute findings. Other: None. IMPRESSION: No acute intracranial abnormality. Electronically Signed   By: Fidela Salisbury M.D.   On: 07/19/2015 22:56   Ct Angio Neck W/cm &/or Wo/cm  07/20/2015  CLINICAL DATA:  80 year old female with sudden onset headache. MRI in  negative for acute stroke. Initial encounter. EXAM: CT ANGIOGRAPHY HEAD AND NECK TECHNIQUE: Multidetector CT imaging of the head and neck was performed using the standard protocol during bolus administration of intravenous contrast. Multiplanar CT image reconstructions and MIPs were obtained to evaluate the vascular anatomy. Carotid stenosis measurements (when applicable) are obtained utilizing NASCET criteria, using the distal internal carotid diameter as the denominator. CONTRAST:  50 mL Isovue 370 COMPARISON:  Brain MRI with head and neck MRA 0021 hours today. Noncontrast head CT 07/19/2015, and earlier FINDINGS: CTA NECK Skeleton: No acute osseous abnormality identified. Mild for age degenerative changes in the cervical spine. Mild scoliosis. Visualized paranasal sinuses and mastoids are clear. Other neck: Negative lung apices. No superior mediastinal lymphadenopathy. Sub cm heterogeneously enhancing and coarsely calcified thyroid nodules which do not meet consensus criteria for ultrasound follow-up. Asymmetric sclerosis of the right, but no laryngeal mass (series 401, image 57). Negative pharyngeal soft tissue contours. Negative parapharyngeal spaces, retropharyngeal space, sublingual space, submandibular glands, and parotid glands. Bilateral cervical lymph nodes are within normal limits. Aortic arch: Bovine type arch configuration. Widespread circumferential soft plaque in the arch and proximal great vessels. Lesser associated calcified plaque. The distal arch  demonstrates fusiform dilatation to at least 37 mm diameter (series 401, image 3). Right carotid system: No brachiocephalic artery or right CCA origin stenosis despite circumferential soft plaque. Soft plaque in the right CCA without stenosis proximal to the bifurcation. At the bifurcation soft and calcified plaque affects the right ICA origin and bulb. In the distal bulb confluent calcified plaque results in a short segment of high-grade stenosis numerically estimated at 70 % with respect to the distal vessel (series 403, image 136 and series 406, image 91). Distal to the bulb the cervical right ICA is negative. Left carotid system: No left CCA stenosis proximal to the bifurcation. At the bifurcation soft plaque mostly affects the ECA origin while soft and calcified plaque affects the left ICA origin and bulb, but without hemodynamically significant stenosis (less than 50 % with respect to the distal vessel). Negative cervical left ICA otherwise. Vertebral arteries: No proximal right subclavian artery stenosis despite soft plaque. Soft plaque at the right vertebral artery origin but no significant stenosis (series 406, image 112). The right vertebral artery is normal to the skullbase. No hemodynamically significant proximal left subclavian artery stenosis despite soft and calcified plaque. Soft plaque at the left vertebral artery origin but no significant stenosis (series 406, image 116). The left vertebral artery is mildly dominant and normal to the skullbase. CTA HEAD Posterior circulation: Soft and calcified plaque in the distal left vertebral artery, V4 segment, resulting an mild stenosis. Mild irregularity also in the right vertebral artery V4 segment. Normal PICA origins and vertebrobasilar junction. No basilar artery stenosis. Normal SCA and PCA origins. Both posterior communicating arteries are present. Bilateral PCA P3 branches are mildly irregular but otherwise normal. Anterior circulation: Both ICA  siphons are patent. Mild to moderate calcified plaque in the cavernous segments without significant stenosis. Ophthalmic and posterior communicating artery origins are normal. Patent carotid termini. Normal MCA and ACA origins. Anterior communicating artery and bilateral ACA branches are within normal limits. Left MCA M1 segment, bifurcation, and left MCA branches are within normal limits. Right MCA M1 segment, bifurcation, and right MCA branches are within normal limits. Venous sinuses: Patent. Anatomic variants: Dominant left vertebral artery. Bovine type aortic arch configuration. Delayed phase: No abnormal enhancement identified. IMPRESSION: 1. High-grade stenosis at the right ICA bulb (numerically estimated  at 70%). Proximal left ICA and bilateral carotid siphon atherosclerosis without significant stenosis. 2. Mild PCA irregularity without significant stenosis. Dominant left vertebral artery with left V4 segment soft and calcified plaque but no significant stenosis. Soft plaque in the aortic arch, proximal great vessels, and at both vertebral artery origins without significant stenosis. 3. 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 Recommend annual imaging followup by CTA or MRA. This recommendation follows 2010 ACCF/AHA/AATS/ACR/ASA/SCA/SCAI/SIR/STS/SVM Guidelines for the Diagnosis and Management of Patients With Thoracic Aortic Disease. Circulation. 2010; 121: [id number]. 4. Stable CT appearance of the brain since 07/19/2015. Mild chronic small vessel disease Electronically Signed   By: Genevie Ann M.D.   On: 07/20/2015 11:39   Mr Jodene Nam Head Wo Contrast  07/20/2015  CLINICAL DATA:  Initial evaluation for acute headache. EXAM: MRI HEAD WITHOUT AND WITH CONTRAST MRA HEAD WITHOUT CONTRAST MRA NECK WITHOUT AND WITH CONTRAST TECHNIQUE: Multiplanar, multiecho pulse sequences of the brain and surrounding structures were obtained without and with intravenous contrast. Angiographic images of the Circle of Willis  were obtained using MRA technique without intravenous contrast. Angiographic images of the neck were obtained using MRA technique without and with intravenous contrast. Carotid stenosis measurements (when applicable) are obtained utilizing NASCET criteria, using the distal internal carotid diameter as the denominator. CONTRAST:  39mL MULTIHANCE GADOBENATE DIMEGLUMINE 529 MG/ML IV SOLN COMPARISON:  Prior CT from 07/19/2015. FINDINGS: MRI HEAD FINDINGS Mild diffuse prominence of the CSF containing spaces compatible with generalized age-related cerebral atrophy. Mild confluent T2 hyperintensity within the periventricular white matter like related to minimal chronic small vessel ischemic changes, felt to be within normal limits for patient age. Few small remote lacunar infarcts present within the left caudate. Additional remote lacunar infarct within the right thalamus. Possible additional remote lacunar infarct within the right lentiform nucleus. Additional cystic lucencies within the basal ganglia likely reflect dilated perivascular spaces. No evidence for remote cortical or cerebellar infarct. No abnormal foci of restricted diffusion to suggest acute intracranial infarct. Gray-white matter differentiation maintained. Major intracranial vascular flow voids are preserved. No acute or chronic intracranial hemorrhage. No mass lesion, midline shift, or mass effect. No hydrocephalus. No extra-axial fluid collection. Major dural sinuses are grossly patent. No abnormal enhancement. Craniocervical junction within normal limits. Visualized upper cervical spine unremarkable. Pituitary gland within normal limits. No acute abnormality about the orbits. Paranasal sinuses are largely clear. No mastoid effusion. Inner ear structures grossly normal. Bone marrow signal intensity within normal limits. No scalp soft tissue abnormality. MRA HEAD FINDINGS ANTERIOR CIRCULATION: Study is degraded by motion artifact, or opacification of the  vessels on 3D reconstructions. Visualized distal cervical segments of the internal carotid arteries are patent with antegrade flow. Petrous, cavernous, and supraclinoid segments widely patent without flow limiting stenosis. A1 segments patent bilaterally. Anterior communicating artery normal. Anterior cerebral arteries well opacified to their distal aspects. M1 segments widely patent proximally without stenosis or occlusion. Distal M1 segments not well evaluated on this exam, but are grossly patent. MCA bifurcations not well evaluated. Distal MCA branches fairly symmetric and well opacified, although are not well evaluated proximally due to artifact on this exam POSTERIOR CIRCULATION: Left vertebral artery dominant and widely patent to the vertebrobasilar junction. Diminutive right vertebral artery grossly patent as well, although it was not attenuated distally. This is likely artifactual on this exam, as this appears patent on corresponding MRA of the neck. Basilar artery widely patent to its distal aspect. Superior cerebral arteries patent bilaterally. Both of the posterior cerebral arteries  arise from the basilar artery and are well opacified to their distal aspects. Small left posterior communicating artery marrow multifocal moderate to severe stenoses noted within the P2 segments bilaterally, left worse than right. Small left posterior communicating artery noted. No aneurysm or vascular malformation. MRA NECK FINDINGS Visualized aortic arch of normal caliber with normal branch pattern. No high-grade stenosis at the origin of the great vessels. Right common carotid artery patent from its origin to the bifurcation. Mild atheromatous irregularity about the right bifurcation/proximal right ICA without significant stenosis. Right ICA well opacified distally to the circle of Willis without flow-limiting stenosis, dissection, or vascular occlusion. Left common carotid artery patent from its origin to the bifurcation.  Atheromatous irregularity about the left bifurcation/proximal left ICA without definite flow limiting stenosis. Left ICA patent from the bifurcation to the skullbase without stenosis, dissection, or occlusion. Note made of moderate narrowing of the proximal left external carotid artery. Both vertebral arteries arise from the subclavian arteries. Left vertebral artery is dominant. There is question of mild to moderate narrowing at the origin of the left vertebral artery (series 9, image 20). Vertebral arteries otherwise patent within the neck without stenosis, dissection, or occlusion. IMPRESSION: MRI HEAD IMPRESSION: 1. No acute intracranial process identified. 2. Small remote lacunar infarcts involving the bilateral basal ganglia and right thalamus as above. 3. Mild age-related cerebral atrophy. MRA HEAD IMPRESSION: 1. Motion degraded study. No large or proximal arterial branch occlusion identified. No correctable stenosis. 2. 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 Please note that these stenoses may in part be related to motion artifact on this exam. No other definite focal high-grade stenosis identified on this limited exam. MRA NECK IMPRESSION: 1. No high-grade or critical stenosis identified within the major arterial vasculature of the neck. 2. Mild atheromatous irregularity about the carotid bifurcations/proximal ICAs bilaterally without high-grade flow-limiting stenosis, slightly worse on the left. 3. Question mild to moderate stenosis at the origin of the dominant left vertebral artery. Vertebral arteries otherwise widely patent within the neck. 4. Incidental note made of moderate stenosis of the proximal left external carotid artery. Electronically Signed   By: Jeannine Boga M.D.   On: 07/20/2015 02:26   Mr Angiogram Neck W Wo Contrast  07/20/2015  CLINICAL DATA:  Initial evaluation for acute headache. EXAM: MRI HEAD WITHOUT AND WITH CONTRAST MRA HEAD  WITHOUT CONTRAST MRA NECK WITHOUT AND WITH CONTRAST TECHNIQUE: Multiplanar, multiecho pulse sequences of the brain and surrounding structures were obtained without and with intravenous contrast. Angiographic images of the Circle of Willis were obtained using MRA technique without intravenous contrast. Angiographic images of the neck were obtained using MRA technique without and with intravenous contrast. Carotid stenosis measurements (when applicable) are obtained utilizing NASCET criteria, using the distal internal carotid diameter as the denominator. CONTRAST:  52mL MULTIHANCE GADOBENATE DIMEGLUMINE 529 MG/ML IV SOLN COMPARISON:  Prior CT from 07/19/2015. FINDINGS: MRI HEAD FINDINGS Mild diffuse prominence of the CSF containing spaces compatible with generalized age-related cerebral atrophy. Mild confluent T2 hyperintensity within the periventricular white matter like related to minimal chronic small vessel ischemic changes, felt to be within normal limits for patient age. Few small remote lacunar infarcts present within the left caudate. Additional remote lacunar infarct within the right thalamus. Possible additional remote lacunar infarct within the right lentiform nucleus. Additional cystic lucencies within the basal ganglia likely reflect dilated perivascular spaces. No evidence for remote cortical or cerebellar infarct. No abnormal foci of restricted diffusion to suggest  acute intracranial infarct. Gray-white matter differentiation maintained. Major intracranial vascular flow voids are preserved. No acute or chronic intracranial hemorrhage. No mass lesion, midline shift, or mass effect. No hydrocephalus. No extra-axial fluid collection. Major dural sinuses are grossly patent. No abnormal enhancement. Craniocervical junction within normal limits. Visualized upper cervical spine unremarkable. Pituitary gland within normal limits. No acute abnormality about the orbits. Paranasal sinuses are largely clear. No  mastoid effusion. Inner ear structures grossly normal. Bone marrow signal intensity within normal limits. No scalp soft tissue abnormality. MRA HEAD FINDINGS ANTERIOR CIRCULATION: Study is degraded by motion artifact, or opacification of the vessels on 3D reconstructions. Visualized distal cervical segments of the internal carotid arteries are patent with antegrade flow. Petrous, cavernous, and supraclinoid segments widely patent without flow limiting stenosis. A1 segments patent bilaterally. Anterior communicating artery normal. Anterior cerebral arteries well opacified to their distal aspects. M1 segments widely patent proximally without stenosis or occlusion. Distal M1 segments not well evaluated on this exam, but are grossly patent. MCA bifurcations not well evaluated. Distal MCA branches fairly symmetric and well opacified, although are not well evaluated proximally due to artifact on this exam POSTERIOR CIRCULATION: Left vertebral artery dominant and widely patent to the vertebrobasilar junction. Diminutive right vertebral artery grossly patent as well, although it was not attenuated distally. This is likely artifactual on this exam, as this appears patent on corresponding MRA of the neck. Basilar artery widely patent to its distal aspect. Superior cerebral arteries patent bilaterally. Both of the posterior cerebral arteries arise from the basilar artery and are well opacified to their distal aspects. Small left posterior communicating artery marrow multifocal moderate to severe stenoses noted within the P2 segments bilaterally, left worse than right. Small left posterior communicating artery noted. No aneurysm or vascular malformation. MRA NECK FINDINGS Visualized aortic arch of normal caliber with normal branch pattern. No high-grade stenosis at the origin of the great vessels. Right common carotid artery patent from its origin to the bifurcation. Mild atheromatous irregularity about the right  bifurcation/proximal right ICA without significant stenosis. Right ICA well opacified distally to the circle of Willis without flow-limiting stenosis, dissection, or vascular occlusion. Left common carotid artery patent from its origin to the bifurcation. Atheromatous irregularity about the left bifurcation/proximal left ICA without definite flow limiting stenosis. Left ICA patent from the bifurcation to the skullbase without stenosis, dissection, or occlusion. Note made of moderate narrowing of the proximal left external carotid artery. Both vertebral arteries arise from the subclavian arteries. Left vertebral artery is dominant. There is question of mild to moderate narrowing at the origin of the left vertebral artery (series 9, image 20). Vertebral arteries otherwise patent within the neck without stenosis, dissection, or occlusion. IMPRESSION: MRI HEAD IMPRESSION: 1. No acute intracranial process identified. 2. Small remote lacunar infarcts involving the bilateral basal ganglia and right thalamus as above. 3. Mild age-related cerebral atrophy. MRA HEAD IMPRESSION: 1. Motion degraded study. No large or proximal arterial branch occlusion identified. No correctable stenosis. 2. 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 Please note that these stenoses may in part be related to motion artifact on this exam. No other definite focal high-grade stenosis identified on this limited exam. MRA NECK IMPRESSION: 1. No high-grade or critical stenosis identified within the major arterial vasculature of the neck. 2. Mild atheromatous irregularity about the carotid bifurcations/proximal ICAs bilaterally without high-grade flow-limiting stenosis, slightly worse on the left. 3. Question mild to moderate stenosis at the origin of the  dominant left vertebral artery. Vertebral arteries otherwise widely patent within the neck. 4. Incidental note made of moderate stenosis of the proximal left  external carotid artery. Electronically Signed   By: Jeannine Boga M.D.   On: 07/20/2015 02:26   Mr Jeri Cos [id number] Contrast  07/20/2015  CLINICAL DATA:  Initial evaluation for acute headache. EXAM: MRI HEAD WITHOUT AND WITH CONTRAST MRA HEAD WITHOUT CONTRAST MRA NECK WITHOUT AND WITH CONTRAST TECHNIQUE: Multiplanar, multiecho pulse sequences of the brain and surrounding structures were obtained without and with intravenous contrast. Angiographic images of the Circle of Willis were obtained using MRA technique without intravenous contrast. Angiographic images of the neck were obtained using MRA technique without and with intravenous contrast. Carotid stenosis measurements (when applicable) are obtained utilizing NASCET criteria, using the distal internal carotid diameter as the denominator. CONTRAST:  54mL MULTIHANCE GADOBENATE DIMEGLUMINE 529 MG/ML IV SOLN COMPARISON:  Prior CT from 07/19/2015. FINDINGS: MRI HEAD FINDINGS Mild diffuse prominence of the CSF containing spaces compatible with generalized age-related cerebral atrophy. Mild confluent T2 hyperintensity within the periventricular white matter like related to minimal chronic small vessel ischemic changes, felt to be within normal limits for patient age. Few small remote lacunar infarcts present within the left caudate. Additional remote lacunar infarct within the right thalamus. Possible additional remote lacunar infarct within the right lentiform nucleus. Additional cystic lucencies within the basal ganglia likely reflect dilated perivascular spaces. No evidence for remote cortical or cerebellar infarct. No abnormal foci of restricted diffusion to suggest acute intracranial infarct. Gray-white matter differentiation maintained. Major intracranial vascular flow voids are preserved. No acute or chronic intracranial hemorrhage. No mass lesion, midline shift, or mass effect. No hydrocephalus. No extra-axial fluid collection. Major dural sinuses are  grossly patent. No abnormal enhancement. Craniocervical junction within normal limits. Visualized upper cervical spine unremarkable. Pituitary gland within normal limits. No acute abnormality about the orbits. Paranasal sinuses are largely clear. No mastoid effusion. Inner ear structures grossly normal. Bone marrow signal intensity within normal limits. No scalp soft tissue abnormality. MRA HEAD FINDINGS ANTERIOR CIRCULATION: Study is degraded by motion artifact, or opacification of the vessels on 3D reconstructions. Visualized distal cervical segments of the internal carotid arteries are patent with antegrade flow. Petrous, cavernous, and supraclinoid segments widely patent without flow limiting stenosis. A1 segments patent bilaterally. Anterior communicating artery normal. Anterior cerebral arteries well opacified to their distal aspects. M1 segments widely patent proximally without stenosis or occlusion. Distal M1 segments not well evaluated on this exam, but are grossly patent. MCA bifurcations not well evaluated. Distal MCA branches fairly symmetric and well opacified, although are not well evaluated proximally due to artifact on this exam POSTERIOR CIRCULATION: Left vertebral artery dominant and widely patent to the vertebrobasilar junction. Diminutive right vertebral artery grossly patent as well, although it was not attenuated distally. This is likely artifactual on this exam, as this appears patent on corresponding MRA of the neck. Basilar artery widely patent to its distal aspect. Superior cerebral arteries patent bilaterally. Both of the posterior cerebral arteries arise from the basilar artery and are well opacified to their distal aspects. Small left posterior communicating artery marrow multifocal moderate to severe stenoses noted within the P2 segments bilaterally, left worse than right. Small left posterior communicating artery noted. No aneurysm or vascular malformation. MRA NECK FINDINGS Visualized  aortic arch of normal caliber with normal branch pattern. No high-grade stenosis at the origin of the great vessels. Right common carotid artery patent from its origin to the  bifurcation. Mild atheromatous irregularity about the right bifurcation/proximal right ICA without significant stenosis. Right ICA well opacified distally to the circle of Willis without flow-limiting stenosis, dissection, or vascular occlusion. Left common carotid artery patent from its origin to the bifurcation. Atheromatous irregularity about the left bifurcation/proximal left ICA without definite flow limiting stenosis. Left ICA patent from the bifurcation to the skullbase without stenosis, dissection, or occlusion. Note made of moderate narrowing of the proximal left external carotid artery. Both vertebral arteries arise from the subclavian arteries. Left vertebral artery is dominant. There is question of mild to moderate narrowing at the origin of the left vertebral artery (series 9, image 20). Vertebral arteries otherwise patent within the neck without stenosis, dissection, or occlusion. IMPRESSION: MRI HEAD IMPRESSION: 1. No acute intracranial process identified. 2. Small remote lacunar infarcts involving the bilateral basal ganglia and right thalamus as above. 3. Mild age-related cerebral atrophy. MRA HEAD IMPRESSION: 1. Motion degraded study. No large or proximal arterial branch occlusion identified. No correctable stenosis. 2. 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 Please note that these stenoses may in part be related to motion artifact on this exam. No other definite focal high-grade stenosis identified on this limited exam. MRA NECK IMPRESSION: 1. No high-grade or critical stenosis identified within the major arterial vasculature of the neck. 2. Mild atheromatous irregularity about the carotid bifurcations/proximal ICAs bilaterally without high-grade flow-limiting stenosis, slightly  worse on the left. 3. Question mild to moderate stenosis at the origin of the dominant left vertebral artery. Vertebral arteries otherwise widely patent within the neck. 4. Incidental note made of moderate stenosis of the proximal left external carotid artery. Electronically Signed   By: Jeannine Boga M.D.   On: 07/20/2015 02:26   Dg Esophagus  06/28/2015  CLINICAL DATA:  Dysphagia with pills. Getting choked on food for 2 years. EXAM: ESOPHOGRAM / BARIUM SWALLOW / BARIUM TABLET STUDY TECHNIQUE: Combined double contrast and single contrast examination performed using effervescent crystals, thick barium liquid, and thin barium liquid. The patient was observed with fluoroscopy swallowing a 13 mm barium sulphate tablet. FLUOROSCOPY TIME:  Radiation Exposure Index (as provided by the fluoroscopic device): 4 mGy COMPARISON:  None. FINDINGS: There was normal pharyngeal anatomy and motility. Contrast flowed freely through the esophagus without evidence of a mass. There was normal esophageal mucosa without evidence of irregularity or ulceration. 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 Mild gastroesophageal reflux. No definite hiatal hernia was demonstrated. There is mild relative narrowing of the distal esophagus at the GE junction which does not restrict the passage of a 13 mm barium tablet likely reflecting a prominent Z line. IMPRESSION: 1. 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 2. Mild gastroesophageal reflux. 3. Mild relative narrowing of the distal esophagus at the GE junction which does not restrict the passage of a 13 mm barium tablet likely reflecting a prominent Z line. Electronically Signed   By: Kathreen Devoid   On: 06/28/2015 08:44    Micro Results     No results found for this or any previous visit (from the past 240 hour(s)).     Today   Subjective:   Myrtis Arellano today has no headache,no chest abdominal  pain,no new weakness tingling or numbness, feels much better wants to go home today.   Objective:   Blood pressure 159/48, pulse 53, temperature 98.3 F (36.8 C), temperature source Oral, resp. rate  16, height 5' 5.5" (1.664 m), weight 64.5 kg (142 lb 3.2 oz), SpO2 97 %.  No intake or output data in the 24 hours ending 07/20/15 1833  Exam Awake Alert, Oriented x 3, No new F.N deficits, Normal affect White.AT,PERRAL Supple Neck,No JVD, No cervical lymphadenopathy appriciated.  Symmetrical Chest wall movement, Good air movement bilaterally, CTAB RRR,No Gallops,Rubs or new Murmurs, No Parasternal Heave +ve B.Sounds, Abd Soft, Non tender, No organomegaly appriciated, No rebound -guarding or rigidity. No Cyanosis, Clubbing or edema, No new Rash or bruise  Data Review   CBC w Diff:  Lab Results  Component Value Date   WBC 8.1 07/20/2015   WBC 9.2 03/27/2011   HGB 13.5 07/20/2015   HGB 14.3 03/27/2011   HCT 42.1 07/20/2015   HCT 42.8 03/27/2011   PLT 277 07/20/2015   PLT 261 03/27/2011   LYMPHOPCT 20.5 10/17/2014   MONOPCT 7.4 10/17/2014   EOSPCT 3.1 10/17/2014   BASOPCT 0.7 10/17/2014    CMP:  Lab Results  Component Value Date   NA 141 07/19/2015   NA 139 03/27/2011   K 3.7 07/19/2015   K 3.9 03/27/2011   CL 105 07/19/2015   CL 101 03/27/2011   CO2 28 07/19/2015   CO2 26 03/27/2011   BUN 22* 07/19/2015   BUN 16 03/27/2011   CREATININE 0.85 07/20/2015   CREATININE 0.97 03/27/2011   PROT 7.0 10/17/2014   PROT 7.6 03/27/2011   ALBUMIN 3.9 10/17/2014   ALBUMIN 3.6 03/27/2011   BILITOT 0.6 10/17/2014   BILITOT 0.4 03/27/2011   ALKPHOS 58 10/17/2014   ALKPHOS 61 03/27/2011   AST 22 10/17/2014   AST 38* 03/27/2011   ALT 16 10/17/2014   ALT 30 03/27/2011  .   Total Time in preparing paper work, data evaluation and todays exam - 35 minutes  Simmie Garin M.D on 07/20/2015 at 6:33 PM  [facility]   Office  [phone]

## 2015-07-20 NOTE — Progress Notes (Signed)
Pt stable, in no acute distress.  Paged neuro hospitalist PA, who is getting pt assigned to md.

## 2015-07-20 NOTE — ED Notes (Signed)
Care link here to pick up patient 

## 2015-07-20 NOTE — ED Notes (Signed)
Owens Shark, MD and and this RN to bedside to speak with patient and daughter regarding findings noted on MRI. MD with plans to admit patient.

## 2015-07-20 NOTE — ED Notes (Signed)
Patient OOB to restroom. Adamant about being unhooked from telemetry monitoring and walking to bathroom across hall. Patient with steady gait noted. Denies dizziness and pain.

## 2015-07-20 NOTE — Progress Notes (Signed)
Pt back from vascular.

## 2015-07-20 NOTE — Progress Notes (Signed)
Pt took home dose of methotrexate. Did not take dose on Thursday, so took it today. rn did not know about this until after the fact.

## 2015-07-20 NOTE — Discharge Instructions (Signed)
Follow with Primary MD [name], MD in 7 days   Get CBC, CMP, checked  by Primary MD next visit.    Activity: As tolerated with Full fall precautions use walker/cane & assistance as needed   Disposition Home   Diet: Heart Healthy  , with feeding assistance and aspiration precautions.  For Heart failure patients - Check your Weight same time everyday, if you gain over 2 pounds, or you develop in leg swelling, experience more shortness of breath or chest pain, call your Primary MD immediately. Follow Cardiac Low Salt Diet and 1.5 lit/day fluid restriction.   On your next visit with your primary care physician please Get Medicines reviewed and adjusted.   Please request your Prim.MD to go over all Hospital Tests and Procedure/Radiological results at the follow up, please get all Hospital records sent to your Prim MD by signing hospital release before you go home.   If you experience worsening of your admission symptoms, develop shortness of breath, life threatening emergency, suicidal or homicidal thoughts you must seek medical attention immediately by calling 911 or calling your MD immediately  if symptoms less severe.  You Must read complete instructions/literature along with all the possible adverse reactions/side effects for all the Medicines you take and that have been prescribed to you. Take any new Medicines after you have completely understood and accpet all the possible adverse reactions/side effects.   Do not drive, operating heavy machinery, perform activities at heights, swimming or participation in water activities or provide baby sitting services if your were admitted for syncope or siezures until you have seen by Primary MD or a Neurologist and advised to do so again.  Do not drive when taking Pain medications.    Do not take more than prescribed Pain, Sleep and Anxiety Medications  Special Instructions: If you have smoked or chewed Tobacco  in the last 2 yrs  please stop smoking, stop any regular Alcohol  and or any Recreational drug use.  Wear Seat belts while driving.   Please note  You were cared for by a hospitalist during your hospital stay. If you have any questions about your discharge medications or the care you received while you were in the hospital after you are discharged, you can call the unit and asked to speak with the hospitalist on call if the hospitalist that took care of you is not available. Once you are discharged, your primary care physician will handle any further medical issues. Please note that NO REFILLS for any discharge medications will be authorized once you are discharged, as it is imperative that you return to your primary care physician (or establish a relationship with a primary care physician if you do not have one) for your aftercare needs so that they can reassess your need for medications and monitor your lab values. Transient Ischemic Attack A transient ischemic attack (TIA) is a "warning stroke" that causes stroke-like symptoms. Unlike a stroke, a TIA does not cause permanent damage to the brain. The symptoms of a TIA can happen very fast and do not last long. It is important to know the symptoms of a TIA and what to do. This can help prevent a major stroke or death. CAUSES  A TIA is caused by a temporary blockage in an artery in the brain or neck (carotid artery). The blockage does not allow the brain to get the blood supply it needs and can cause different symptoms. The blockage can be caused by either:  A  blood clot.  Fatty buildup (plaque) in a neck or brain artery. RISK FACTORS  High blood pressure (hypertension).  High cholesterol.  Diabetes mellitus.  Heart disease.  The buildup of plaque in the blood vessels (peripheral artery disease or atherosclerosis).  The buildup of plaque in the blood vessels that provide blood and oxygen to the brain (carotid artery stenosis).  An abnormal heart rhythm  (atrial fibrillation).  Obesity.  Using any tobacco products, including cigarettes, chewing tobacco, or electronic cigarettes.  Taking oral contraceptives, especially in combination with using tobacco.  Physical inactivity.  A diet high in fats, salt (sodium), and calories.  Excessive alcohol use.  Use of illegal drugs (especially cocaine and methamphetamine).  Being female.  Being African American.  Being over the age of 19 years.  Family history of stroke.  Previous history of blood clots, stroke, TIA, or heart attack.  Sickle cell disease. SIGNS AND SYMPTOMS  TIA symptoms are the same as a stroke but are temporary. These symptoms usually develop suddenly, or may be newly present upon waking from sleep:  Sudden weakness or numbness of the face, arm, or leg, especially on one side of the body.  Sudden trouble walking or difficulty moving arms or legs.  Sudden confusion.  Sudden personality changes.  Trouble speaking (aphasia) or understanding.  Difficulty swallowing.  Sudden trouble seeing in one or both eyes.  Double vision.  Dizziness.  Loss of balance or coordination.  Sudden severe headache with no known cause.  Trouble reading or writing.  Loss of bowel or bladder control.  Loss of consciousness. DIAGNOSIS  Your health care provider may be able to determine the presence or absence of a TIA based on your symptoms, history, and physical exam. CT scan of the brain is usually performed to help identify a TIA. Other tests may include:  Electrocardiography (ECG).  Continuous heart monitoring.  Echocardiography.  Carotid ultrasonography.  MRI.  A scan of the brain circulation.  Blood tests. TREATMENT  Since the symptoms of TIA are the same as a stroke, it is important to seek treatment as soon as possible. You may need a medicine to dissolve a blood clot (thrombolytic) if that is the cause of the TIA. This medicine cannot be given if too much  time has passed. Treatment may also include:   Rest, oxygen, fluids through an IV tube, and medicines to thin the blood (anticoagulants).  Measures will be taken to prevent short-term and long-term complications, including infection from breathing foreign material into the lungs (aspiration pneumonia), blood clots in the legs, and falls.  Procedures to either remove plaque in the carotid arteries or dilate carotid arteries that have narrowed due to plaque. Those procedures are:  Carotid endarterectomy.  Carotid angioplasty and stenting.  Medicines and diet may be used to address diabetes, high blood pressure, and other underlying risk factors. HOME CARE INSTRUCTIONS   Take medicines only as directed by your health care provider. Follow the directions carefully. Medicines may be used to control risk factors for a stroke. Be sure you understand all your medicine instructions.  You may be told to take aspirin or the anticoagulant warfarin. Warfarin needs to be taken exactly as instructed.  Taking too much or too little warfarin is dangerous. Too much warfarin increases the risk of bleeding. Too little warfarin continues to allow the risk for blood clots. While taking warfarin, you will need to have regular blood tests to measure your blood clotting time. A PT blood test measures  how long it takes for blood to clot. Your PT is used to calculate another value called an INR. Your PT and INR help your health care provider to adjust your dose of warfarin. The dose can change for many reasons. It is critically important that you take warfarin exactly as prescribed.  Many foods, especially foods high in vitamin K can interfere with warfarin and affect the PT and INR. Foods high in vitamin K include spinach, kale, broccoli, cabbage, collard and turnip greens, Brussels sprouts, peas, cauliflower, seaweed, and parsley, as well as beef and pork liver, green tea, and soybean oil. You should eat a consistent  amount of foods high in vitamin K. Avoid major changes in your diet, or notify your health care provider before changing your diet. Arrange a visit with a dietitian to answer your questions.  Many medicines can interfere with warfarin and affect the PT and INR. You must tell your health care provider about any and all medicines you take; this includes all vitamins and supplements. Be especially cautious with aspirin and anti-inflammatory medicines. Do not take or discontinue any prescribed or over-the-counter medicine except on the advice of your health care provider or pharmacist.  Warfarin can have side effects, such as excessive bruising or bleeding. You will need to hold pressure over cuts for longer than usual. Your health care provider or pharmacist will discuss other potential side effects.  Avoid sports or activities that may cause injury or bleeding.  Be careful when shaving, flossing your teeth, or handling sharp objects.  Alcohol can change the body's ability to handle warfarin. It is best to avoid alcoholic drinks or consume only very small amounts while taking warfarin. Notify your health care provider if you change your alcohol intake.  Notify your dentist or other health care providers before procedures.  Eat a diet that includes 5 or more servings of fruits and vegetables each day. This may reduce the risk of stroke. Certain diets may be prescribed to address high blood pressure, high cholesterol, diabetes, or obesity.  A diet low in sodium, saturated fat, trans fat, and cholesterol is recommended to manage high blood pressure.  A diet low in saturated fat, trans fat, and cholesterol, and high in fiber may control cholesterol levels.  A controlled-carbohydrate, controlled-sugar diet is recommended to manage diabetes.  A reduced-calorie diet that is low in sodium, saturated fat, trans fat, and cholesterol is recommended to manage obesity.  Maintain a healthy weight.  Stay  physically active. It is recommended that you get at least 30 minutes of activity on most or all days.  Do not use any tobacco products, including cigarettes, chewing tobacco, or electronic cigarettes. If you need help quitting, ask your health care provider.  Limit alcohol intake to no more than 1 drink per day for nonpregnant women and 2 drinks per day for men. One drink equals 12 ounces of beer, 5 ounces of wine, or 1 ounces of hard liquor.  Do not abuse drugs.  A safe home environment is important to reduce the risk of falls. Your health care provider may arrange for specialists to evaluate your home. Having grab bars in the bedroom and bathroom is often important. Your health care provider may arrange for equipment to be used at home, such as raised toilets and a seat for the shower.  Follow all instructions for follow-up with your health care provider. This is very important. This includes any referrals and lab tests. Proper follow-up can prevent a stroke  or another TIA from occurring. PREVENTION  The risk of a TIA can be decreased by appropriately treating high blood pressure, high cholesterol, diabetes, heart disease, and obesity, and by quitting smoking, limiting alcohol, and staying physically active. SEEK MEDICAL CARE IF:  You have personality changes.  You have difficulty swallowing.  You are seeing double.  You have dizziness.  You have a fever. SEEK IMMEDIATE MEDICAL CARE IF:  Any of the following symptoms may represent a serious problem that is an emergency. Do not wait to see if the symptoms will go away. Get medical help right away. Call your local emergency services (911 in [place] Do not drive yourself to the hospital.  You have sudden weakness or numbness of the face, arm, or leg, especially on one side of the body.  You have sudden trouble walking or difficulty moving arms or legs.  You have sudden confusion.  You have trouble speaking (aphasia) or  understanding.  You have sudden trouble seeing in one or both eyes.  You have a loss of balance or coordination.  You have a sudden, severe headache with no known cause.  You have new chest pain or an irregular heartbeat.  You have a partial or total loss of consciousness. MAKE SURE YOU:   Understand these instructions.  Will watch your condition.  Will get help right away if you are not doing well or get worse.   This information is not intended to replace advice given to you by your health care provider. Make sure you discuss any questions you have with your health care provider.   Document Released: 11/27/2004 Document Revised: 03/10/2014 Document Reviewed: 05/25/2013 Elsevier Interactive Patient Education [employer].

## 2015-07-20 NOTE — Progress Notes (Signed)
  Echocardiogram 2D Echocardiogram has been performed.  Donata Clay 07/20/2015, 1:22 PM

## 2015-07-20 NOTE — ED Notes (Signed)
Patient returned to ED 4 at this time.

## 2015-07-20 NOTE — Consult Note (Signed)
Requesting Physician: [facility]ad hospitalist    Chief Complaint: TIA   HPI:                                                                                                                                         Ressa Tapani is an 80 y.o. female presented to [place] with acute sensation of a "pop in her head followed by tingling in the left face, hand (only) and foot (only).  This lasted for only seconds and went away. She has had similar symptoms a month ago but did not seek attention."  Per note she was very hypertensive during transportation. She was transferred to [facility] hospital for hospitalist to admit and Stroke team to consult. Patient currently is asymptomatic.   Date last known well: 5.18.2017 Time last known well: Time: 17:00 tPA Given: No: symptoms resolved and no CVA on MRI   Past Medical History  Diagnosis Date  . History of kidney stones October 2013  . Arthritis   . Hyperlipidemia   . Hx: UTI (urinary tract infection)   . History of chicken pox     Past Surgical History  Procedure Laterality Date  . Breast biopsy Right 1995  . Tonsillectomy and adenoidectomy  1945  . Partial hysterectomy  1972    Family History  Problem Relation Age of Onset  . Heart disease Father    Social History:  reports that she has never smoked. She has never used smokeless tobacco. She reports that she does not drink alcohol or use illicit drugs.  Allergies: No Known Allergies  Medications:                                                                                                                           Prior to Admission:  Prescriptions prior to admission  Medication Sig Dispense Refill Last Dose  . ALPRAZolam (XANAX) 0.5 MG tablet TAKE 1 TABLET AT BEDTIME AS NEEDED FOR ANXIETY. 30 tablet 1 PRN at PRN  . aspirin EC 81 MG tablet Take 162 mg by mouth daily as needed.   Past Week at Unknown time  . atenolol (TENORMIN) 25 MG tablet Take 1 tablet (25 mg total) by mouth daily. 30  tablet 5 07/19/2015 at 1000  . folic acid (FOLVITE) [id number] MCG tablet Take 800 mcg by mouth daily.   07/19/2015 at 1000  .  methotrexate (RHEUMATREX) 2.5 MG tablet Take 6 tablets per week. (Patient taking differently: Take 8 tablets per week.) 4 tablet 0 Past Month at Unknown time  . PARoxetine (PAXIL) 10 MG tablet Take 1 tablet (10 mg total) by mouth every morning. 30 tablet 5 07/19/2015 at 1000  . vitamin C (ASCORBIC ACID) 500 MG tablet Take 500 mg by mouth 2 (two) times daily.   Past Month at Unknown time   Scheduled:  ROS:                                                                                                                                       History obtained from the patient  General ROS: negative for - chills, fatigue, fever, night sweats, weight gain or weight loss Psychological ROS: negative for - behavioral disorder, hallucinations, memory difficulties, mood swings or suicidal ideation Ophthalmic ROS: negative for - blurry vision, double vision, eye pain or loss of vision ENT ROS: negative for - epistaxis, nasal discharge, oral lesions, sore throat, tinnitus or vertigo Allergy and Immunology ROS: negative for - hives or itchy/watery eyes Hematological and Lymphatic ROS: negative for - bleeding problems, bruising or swollen lymph nodes Endocrine ROS: negative for - galactorrhea, hair pattern changes, polydipsia/polyuria or temperature intolerance Respiratory ROS: negative for - cough, hemoptysis, shortness of breath or wheezing Cardiovascular ROS: negative for - chest pain, dyspnea on exertion, edema or irregular heartbeat Gastrointestinal ROS: negative for - abdominal pain, diarrhea, hematemesis, nausea/vomiting or stool incontinence Genito-Urinary ROS: negative for - dysuria, hematuria, incontinence or urinary frequency/urgency Musculoskeletal ROS: negative for - joint swelling or muscular weakness Neurological ROS: as noted in HPI Dermatological ROS: negative for rash and skin  lesion changes  Neurologic Examination:                                                                                                      Blood pressure 161/46, pulse 57, temperature 98 F (36.7 C), temperature source Oral, resp. rate 15, height 5' 5.5" (1.664 m), weight 64.5 kg (142 lb 3.2 oz), SpO2 99 %.  HEENT-  Normocephalic, no lesions, without obvious abnormality.  Normal external eye and conjunctiva.  Normal TM's bilaterally.  Normal auditory canals and external ears. Normal external nose, mucus membranes and septum.  Normal pharynx. Cardiovascular- S1, S2 normal, pulses palpable throughout   Lungs- chest clear, no wheezing, rales, normal symmetric air entry Abdomen- normal findings: bowel sounds normal Extremities- no edema Lymph-no adenopathy palpable Musculoskeletal-no joint tenderness, deformity or  swelling Skin-warm and dry, no hyperpigmentation, vitiligo, or suspicious lesions  Neurological Examination Mental Status: Alert, oriented, thought content appropriate.  Speech fluent without evidence of aphasia.  Able to follow 3 step commands without difficulty. Cranial Nerves: II: Discs flat bilaterally; Visual fields grossly normal, pupils equal, round, reactive to light and accommodation III,IV, VI: ptosis not present, extra-ocular motions intact bilaterally V,VII: smile symmetric, facial light touch sensation normal bilaterally VIII: hearing normal bilaterally IX,X: uvula rises symmetrically XI: bilateral shoulder shrug XII: midline tongue extension Motor: Right : Upper extremity   5/5    Left:     Upper extremity   5/5  Lower extremity   5/5     Lower extremity   5/5 Tone and bulk:normal tone throughout; no atrophy noted Sensory: Pinprick and light touch intact throughout, bilaterally Deep Tendon Reflexes: 2+ and symmetric throughout Plantars: Right: downgoing   Left: downgoing Cerebellar: normal finger-to-nose, normal rapid alternating movements and normal  heel-to-shin test Gait: not tested       Lab Results: Basic Metabolic Panel:  Recent Labs Lab 07/19/15 2225  NA 141  K 3.7  CL 105  CO2 28  GLUCOSE 116*  BUN 22*  CREATININE 0.84  CALCIUM 9.6    Liver Function Tests: No results for input(s): AST, ALT, ALKPHOS, BILITOT, PROT, ALBUMIN in the last 168 hours. No results for input(s): LIPASE, AMYLASE in the last 168 hours. No results for input(s): AMMONIA in the last 168 hours.  CBC:  Recent Labs Lab 07/19/15 2225  WBC 8.3  HGB 13.9  HCT 41.9  MCV 90.0  PLT 272    Cardiac Enzymes:  Recent Labs Lab 07/19/15 2225  TROPONINI <0.03    Lipid Panel: No results for input(s): CHOL, TRIG, HDL, CHOLHDL, VLDL, LDLCALC in the last 168 hours.  CBG: No results for input(s): GLUCAP in the last 168 hours.  Microbiology: Results for orders placed or performed in visit on 01/31/13  CULTURE, URINE COMPREHENSIVE     Status: None   Collection Time: 01/31/13  9:29 AM  Result Value Ref Range Status   Colony Count NO GROWTH  Final   Organism ID, Bacteria NO GROWTH  Final    Coagulation Studies: No results for input(s): LABPROT, INR in the last 72 hours.  Imaging: Ct Head Wo Contrast  07/19/2015  CLINICAL DATA:  Headache. EXAM: CT HEAD WITHOUT CONTRAST TECHNIQUE: Contiguous axial images were obtained from the base of the skull through the vertex without intravenous contrast. COMPARISON:  10/30/1999 FINDINGS: Brain: No evidence of acute infarction, hemorrhage, extra-axial collection, ventriculomegaly, or mass effect. Vascular: No hyperdense vessel. Vascular calcifications at the skullbase. Skull: Negative for fracture or focal lesion. Sinuses/Orbits: No acute findings. Other: None. IMPRESSION: No acute intracranial abnormality. Electronically Signed   By: Fidela Salisbury M.D.   On: 07/19/2015 22:56   Mr Jodene Nam Head Wo Contrast  07/20/2015  CLINICAL DATA:  Initial evaluation for acute headache. EXAM: MRI HEAD WITHOUT AND WITH  CONTRAST MRA HEAD WITHOUT CONTRAST MRA NECK WITHOUT AND WITH CONTRAST TECHNIQUE: Multiplanar, multiecho pulse sequences of the brain and surrounding structures were obtained without and with intravenous contrast. Angiographic images of the Circle of Willis were obtained using MRA technique without intravenous contrast. Angiographic images of the neck were obtained using MRA technique without and with intravenous contrast. Carotid stenosis measurements (when applicable) are obtained utilizing NASCET criteria, using the distal internal carotid diameter as the denominator. CONTRAST:  49mL MULTIHANCE GADOBENATE DIMEGLUMINE 529 MG/ML IV SOLN COMPARISON:  Prior  CT from 07/19/2015. FINDINGS: MRI HEAD FINDINGS Mild diffuse prominence of the CSF containing spaces compatible with generalized age-related cerebral atrophy. Mild confluent T2 hyperintensity within the periventricular white matter like related to minimal chronic small vessel ischemic changes, felt to be within normal limits for patient age. Few small remote lacunar infarcts present within the left caudate. Additional remote lacunar infarct within the right thalamus. Possible additional remote lacunar infarct within the right lentiform nucleus. Additional cystic lucencies within the basal ganglia likely reflect dilated perivascular spaces. No evidence for remote cortical or cerebellar infarct. No abnormal foci of restricted diffusion to suggest acute intracranial infarct. Gray-white matter differentiation maintained. Major intracranial vascular flow voids are preserved. No acute or chronic intracranial hemorrhage. No mass lesion, midline shift, or mass effect. No hydrocephalus. No extra-axial fluid collection. Major dural sinuses are grossly patent. No abnormal enhancement. Craniocervical junction within normal limits. Visualized upper cervical spine unremarkable. Pituitary gland within normal limits. No acute abnormality about the orbits. Paranasal sinuses are  largely clear. No mastoid effusion. Inner ear structures grossly normal. Bone marrow signal intensity within normal limits. No scalp soft tissue abnormality. MRA HEAD FINDINGS ANTERIOR CIRCULATION: Study is degraded by motion artifact, or opacification of the vessels on 3D reconstructions. Visualized distal cervical segments of the internal carotid arteries are patent with antegrade flow. Petrous, cavernous, and supraclinoid segments widely patent without flow limiting stenosis. A1 segments patent bilaterally. Anterior communicating artery normal. Anterior cerebral arteries well opacified to their distal aspects. M1 segments widely patent proximally without stenosis or occlusion. Distal M1 segments not well evaluated on this exam, but are grossly patent. MCA bifurcations not well evaluated. Distal MCA branches fairly symmetric and well opacified, although are not well evaluated proximally due to artifact on this exam POSTERIOR CIRCULATION: Left vertebral artery dominant and widely patent to the vertebrobasilar junction. Diminutive right vertebral artery grossly patent as well, although it was not attenuated distally. This is likely artifactual on this exam, as this appears patent on corresponding MRA of the neck. Basilar artery widely patent to its distal aspect. Superior cerebral arteries patent bilaterally. Both of the posterior cerebral arteries arise from the basilar artery and are well opacified to their distal aspects. Small left posterior communicating artery marrow multifocal moderate to severe stenoses noted within the P2 segments bilaterally, left worse than right. Small left posterior communicating artery noted. No aneurysm or vascular malformation. MRA NECK FINDINGS Visualized aortic arch of normal caliber with normal branch pattern. No high-grade stenosis at the origin of the great vessels. Right common carotid artery patent from its origin to the bifurcation. Mild atheromatous irregularity about the  right bifurcation/proximal right ICA without significant stenosis. Right ICA well opacified distally to the circle of Willis without flow-limiting stenosis, dissection, or vascular occlusion. Left common carotid artery patent from its origin to the bifurcation. Atheromatous irregularity about the left bifurcation/proximal left ICA without definite flow limiting stenosis. Left ICA patent from the bifurcation to the skullbase without stenosis, dissection, or occlusion. Note made of moderate narrowing of the proximal left external carotid artery. Both vertebral arteries arise from the subclavian arteries. Left vertebral artery is dominant. There is question of mild to moderate narrowing at the origin of the left vertebral artery (series 9, image 20). Vertebral arteries otherwise patent within the neck without stenosis, dissection, or occlusion. IMPRESSION: MRI HEAD IMPRESSION: 1. No acute intracranial process identified. 2. Small remote lacunar infarcts involving the bilateral basal ganglia and right thalamus as above. 3. Mild age-related cerebral atrophy. MRA  HEAD IMPRESSION: 1. Motion degraded study. No large or proximal arterial branch occlusion identified. No correctable stenosis. 2. 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 Please note that these stenoses may in part be related to motion artifact on this exam. No other definite focal high-grade stenosis identified on this limited exam. MRA NECK IMPRESSION: 1. No high-grade or critical stenosis identified within the major arterial vasculature of the neck. 2. Mild atheromatous irregularity about the carotid bifurcations/proximal ICAs bilaterally without high-grade flow-limiting stenosis, slightly worse on the left. 3. Question mild to moderate stenosis at the origin of the dominant left vertebral artery. Vertebral arteries otherwise widely patent within the neck. 4. Incidental note made of moderate stenosis of the proximal  left external carotid artery. Electronically Signed   By: Jeannine Boga M.D.   On: 07/20/2015 02:26   Mr Angiogram Neck W Wo Contrast  07/20/2015  CLINICAL DATA:  Initial evaluation for acute headache. EXAM: MRI HEAD WITHOUT AND WITH CONTRAST MRA HEAD WITHOUT CONTRAST MRA NECK WITHOUT AND WITH CONTRAST TECHNIQUE: Multiplanar, multiecho pulse sequences of the brain and surrounding structures were obtained without and with intravenous contrast. Angiographic images of the Circle of Willis were obtained using MRA technique without intravenous contrast. Angiographic images of the neck were obtained using MRA technique without and with intravenous contrast. Carotid stenosis measurements (when applicable) are obtained utilizing NASCET criteria, using the distal internal carotid diameter as the denominator. CONTRAST:  46mL MULTIHANCE GADOBENATE DIMEGLUMINE 529 MG/ML IV SOLN COMPARISON:  Prior CT from 07/19/2015. FINDINGS: MRI HEAD FINDINGS Mild diffuse prominence of the CSF containing spaces compatible with generalized age-related cerebral atrophy. Mild confluent T2 hyperintensity within the periventricular white matter like related to minimal chronic small vessel ischemic changes, felt to be within normal limits for patient age. Few small remote lacunar infarcts present within the left caudate. Additional remote lacunar infarct within the right thalamus. Possible additional remote lacunar infarct within the right lentiform nucleus. Additional cystic lucencies within the basal ganglia likely reflect dilated perivascular spaces. No evidence for remote cortical or cerebellar infarct. No abnormal foci of restricted diffusion to suggest acute intracranial infarct. Gray-white matter differentiation maintained. Major intracranial vascular flow voids are preserved. No acute or chronic intracranial hemorrhage. No mass lesion, midline shift, or mass effect. No hydrocephalus. No extra-axial fluid collection. Major dural  sinuses are grossly patent. No abnormal enhancement. Craniocervical junction within normal limits. Visualized upper cervical spine unremarkable. Pituitary gland within normal limits. No acute abnormality about the orbits. Paranasal sinuses are largely clear. No mastoid effusion. Inner ear structures grossly normal. Bone marrow signal intensity within normal limits. No scalp soft tissue abnormality. MRA HEAD FINDINGS ANTERIOR CIRCULATION: Study is degraded by motion artifact, or opacification of the vessels on 3D reconstructions. Visualized distal cervical segments of the internal carotid arteries are patent with antegrade flow. Petrous, cavernous, and supraclinoid segments widely patent without flow limiting stenosis. A1 segments patent bilaterally. Anterior communicating artery normal. Anterior cerebral arteries well opacified to their distal aspects. M1 segments widely patent proximally without stenosis or occlusion. Distal M1 segments not well evaluated on this exam, but are grossly patent. MCA bifurcations not well evaluated. Distal MCA branches fairly symmetric and well opacified, although are not well evaluated proximally due to artifact on this exam POSTERIOR CIRCULATION: Left vertebral artery dominant and widely patent to the vertebrobasilar junction. Diminutive right vertebral artery grossly patent as well, although it was not attenuated distally. This is likely artifactual on this exam, as this appears  patent on corresponding MRA of the neck. Basilar artery widely patent to its distal aspect. Superior cerebral arteries patent bilaterally. Both of the posterior cerebral arteries arise from the basilar artery and are well opacified to their distal aspects. Small left posterior communicating artery marrow multifocal moderate to severe stenoses noted within the P2 segments bilaterally, left worse than right. Small left posterior communicating artery noted. No aneurysm or vascular malformation. MRA NECK  FINDINGS Visualized aortic arch of normal caliber with normal branch pattern. No high-grade stenosis at the origin of the great vessels. Right common carotid artery patent from its origin to the bifurcation. Mild atheromatous irregularity about the right bifurcation/proximal right ICA without significant stenosis. Right ICA well opacified distally to the circle of Willis without flow-limiting stenosis, dissection, or vascular occlusion. Left common carotid artery patent from its origin to the bifurcation. Atheromatous irregularity about the left bifurcation/proximal left ICA without definite flow limiting stenosis. Left ICA patent from the bifurcation to the skullbase without stenosis, dissection, or occlusion. Note made of moderate narrowing of the proximal left external carotid artery. Both vertebral arteries arise from the subclavian arteries. Left vertebral artery is dominant. There is question of mild to moderate narrowing at the origin of the left vertebral artery (series 9, image 20). Vertebral arteries otherwise patent within the neck without stenosis, dissection, or occlusion. IMPRESSION: MRI HEAD IMPRESSION: 1. No acute intracranial process identified. 2. Small remote lacunar infarcts involving the bilateral basal ganglia and right thalamus as above. 3. Mild age-related cerebral atrophy. MRA HEAD IMPRESSION: 1. Motion degraded study. No large or proximal arterial branch occlusion identified. No correctable stenosis. 2. 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 Please note that these stenoses may in part be related to motion artifact on this exam. No other definite focal high-grade stenosis identified on this limited exam. MRA NECK IMPRESSION: 1. No high-grade or critical stenosis identified within the major arterial vasculature of the neck. 2. Mild atheromatous irregularity about the carotid bifurcations/proximal ICAs bilaterally without high-grade flow-limiting  stenosis, slightly worse on the left. 3. Question mild to moderate stenosis at the origin of the dominant left vertebral artery. Vertebral arteries otherwise widely patent within the neck. 4. Incidental note made of moderate stenosis of the proximal left external carotid artery. Electronically Signed   By: Jeannine Boga M.D.   On: 07/20/2015 02:26   Mr Jeri Cos [id number] Contrast  07/20/2015  CLINICAL DATA:  Initial evaluation for acute headache. EXAM: MRI HEAD WITHOUT AND WITH CONTRAST MRA HEAD WITHOUT CONTRAST MRA NECK WITHOUT AND WITH CONTRAST TECHNIQUE: Multiplanar, multiecho pulse sequences of the brain and surrounding structures were obtained without and with intravenous contrast. Angiographic images of the Circle of Willis were obtained using MRA technique without intravenous contrast. Angiographic images of the neck were obtained using MRA technique without and with intravenous contrast. Carotid stenosis measurements (when applicable) are obtained utilizing NASCET criteria, using the distal internal carotid diameter as the denominator. CONTRAST:  34mL MULTIHANCE GADOBENATE DIMEGLUMINE 529 MG/ML IV SOLN COMPARISON:  Prior CT from 07/19/2015. FINDINGS: MRI HEAD FINDINGS Mild diffuse prominence of the CSF containing spaces compatible with generalized age-related cerebral atrophy. Mild confluent T2 hyperintensity within the periventricular white matter like related to minimal chronic small vessel ischemic changes, felt to be within normal limits for patient age. Few small remote lacunar infarcts present within the left caudate. Additional remote lacunar infarct within the right thalamus. Possible additional remote lacunar infarct within the right lentiform nucleus. Additional cystic lucencies  within the basal ganglia likely reflect dilated perivascular spaces. No evidence for remote cortical or cerebellar infarct. No abnormal foci of restricted diffusion to suggest acute intracranial infarct. Gray-white matter  differentiation maintained. Major intracranial vascular flow voids are preserved. No acute or chronic intracranial hemorrhage. No mass lesion, midline shift, or mass effect. No hydrocephalus. No extra-axial fluid collection. Major dural sinuses are grossly patent. No abnormal enhancement. Craniocervical junction within normal limits. Visualized upper cervical spine unremarkable. Pituitary gland within normal limits. No acute abnormality about the orbits. Paranasal sinuses are largely clear. No mastoid effusion. Inner ear structures grossly normal. Bone marrow signal intensity within normal limits. No scalp soft tissue abnormality. MRA HEAD FINDINGS ANTERIOR CIRCULATION: Study is degraded by motion artifact, or opacification of the vessels on 3D reconstructions. Visualized distal cervical segments of the internal carotid arteries are patent with antegrade flow. Petrous, cavernous, and supraclinoid segments widely patent without flow limiting stenosis. A1 segments patent bilaterally. Anterior communicating artery normal. Anterior cerebral arteries well opacified to their distal aspects. M1 segments widely patent proximally without stenosis or occlusion. Distal M1 segments not well evaluated on this exam, but are grossly patent. MCA bifurcations not well evaluated. Distal MCA branches fairly symmetric and well opacified, although are not well evaluated proximally due to artifact on this exam POSTERIOR CIRCULATION: Left vertebral artery dominant and widely patent to the vertebrobasilar junction. Diminutive right vertebral artery grossly patent as well, although it was not attenuated distally. This is likely artifactual on this exam, as this appears patent on corresponding MRA of the neck. Basilar artery widely patent to its distal aspect. Superior cerebral arteries patent bilaterally. Both of the posterior cerebral arteries arise from the basilar artery and are well opacified to their distal aspects. Small left  posterior communicating artery marrow multifocal moderate to severe stenoses noted within the P2 segments bilaterally, left worse than right. Small left posterior communicating artery noted. No aneurysm or vascular malformation. MRA NECK FINDINGS Visualized aortic arch of normal caliber with normal branch pattern. No high-grade stenosis at the origin of the great vessels. Right common carotid artery patent from its origin to the bifurcation. Mild atheromatous irregularity about the right bifurcation/proximal right ICA without significant stenosis. Right ICA well opacified distally to the circle of Willis without flow-limiting stenosis, dissection, or vascular occlusion. Left common carotid artery patent from its origin to the bifurcation. Atheromatous irregularity about the left bifurcation/proximal left ICA without definite flow limiting stenosis. Left ICA patent from the bifurcation to the skullbase without stenosis, dissection, or occlusion. Note made of moderate narrowing of the proximal left external carotid artery. Both vertebral arteries arise from the subclavian arteries. Left vertebral artery is dominant. There is question of mild to moderate narrowing at the origin of the left vertebral artery (series 9, image 20). Vertebral arteries otherwise patent within the neck without stenosis, dissection, or occlusion. IMPRESSION: MRI HEAD IMPRESSION: 1. No acute intracranial process identified. 2. Small remote lacunar infarcts involving the bilateral basal ganglia and right thalamus as above. 3. Mild age-related cerebral atrophy. MRA HEAD IMPRESSION: 1. Motion degraded study. No large or proximal arterial branch occlusion identified. No correctable stenosis. 2. 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 Please note that these stenoses may in part be related to motion artifact on this exam. No other definite focal high-grade stenosis identified on this limited exam. MRA NECK  IMPRESSION: 1. No high-grade or critical stenosis identified within the major arterial vasculature of the neck. 2. Mild atheromatous irregularity  about the carotid bifurcations/proximal ICAs bilaterally without high-grade flow-limiting stenosis, slightly worse on the left. 3. Question mild to moderate stenosis at the origin of the dominant left vertebral artery. Vertebral arteries otherwise widely patent within the neck. 4. Incidental note made of moderate stenosis of the proximal left external carotid artery. Electronically Signed   By: Jeannine Boga M.D.   On: 07/20/2015 02:26       Assessment and plan discussed with with attending physician and they are in agreement.    Etta Quill PA-C [facility] [phone]  07/20/2015, 9:08 AM   Assessment: 80 y.o. female with symptoms of "pop" sensation in her head followed by face, hand and foot tingling on the left. Constellation of symptoms is odd for TIA. [facility] was sent [facility] due to right vertebral artery stenosis/occlusion. At this time she is on no antiplatelet. Will obtain CTA head and neck to further evaluate stenosis.   Stroke Risk Factors - hypertension  Will be seen by Dr. Silverio Decamp. Please see his attestation note for A/P for any additional work up recommendations.

## 2015-07-20 NOTE — Progress Notes (Addendum)
Pt discharged home with family, by car, assessment stable, prescriptions given, discharge instructions reviewed, all questions answered. IV removed. Telemetry discontinued. Pt taken by wheelchair to exit. Time of discharge: 1845  Pt refused lipitor dose in hospital. Will talk to her pcp and see what her options are for cholesterol medication. Reports lipitor in the past caused leg pain.

## 2015-07-20 NOTE — H&P (Signed)
TRH H&P   Patient Demographics:    Beth Espinoza, is a 80 y.o. female  MRN: [id number]   DOB - 1935/12/19  Admit Date - 07/20/2015  Outpatient Primary MD for the patient is [name], MD  Referring MD/NP/PA: [facility]  Patient coming from: Transferred from [name]  ED, lives at home  No chief complaint on file.     HPI:    Beth Espinoza  is a 80 y.o. female, With past medical history of polymyalgia rheumatica, hyperlipidemia, osteoarthritis, presents to [facility] for multiple complaints including headache, left-sided, started yesterday afternoon, as well accompanied by left hand and foot tingling or numbness, denies any tingling or numbness in arm or leg, as well reports hearing a popping sound in her head(she describes it as explosion in her head), but no focal deficits, she is noted to be significantly hypertensive by EMS with systolic blood pressure [id number], was 196/61 at [facility] ED, MRI brain negative for acute CVA, patient was accepted by him see neurology for transfer for TIA workup, patient with known history of migraine, but reports no recurrence for a few years, and it's usually with different presentation, denies any recurrence of her symptoms.    Review of systems:    In addition to the HPI above,  No Fever-chills, Reports Headache, but No changes with Vision or hearing, No problems swallowing food or Liquids, No Chest pain, Cough or Shortness of Breath, No Abdominal pain, No Nausea or Vommitting, Bowel movements are regular, No Blood in stool or Urine, No dysuria, No new skin rashes or bruises, No new joints pains-aches,  No new weakness, tingling, reports brief episode of left hand and foot numbness, no recurrence No recent weight gain or loss, No polyuria, polydypsia or polyphagia, No significant Mental Stressors.  A full 10 point Review of Systems  was done, except as stated above, all other Review of Systems were negative.   With Past History of the following :    Past Medical History  Diagnosis Date  . History of kidney stones October 2013  . Arthritis   . Hyperlipidemia   . Hx: UTI (urinary tract infection)   . History of chicken pox       Past Surgical History  Procedure Laterality Date  . Breast biopsy Right 1995  . Tonsillectomy and adenoidectomy  1945  . Partial hysterectomy  1972      Social History:     Social History  Substance Use Topics  . Smoking status: Never Smoker   . Smokeless tobacco: Never Used  . Alcohol Use: No     Lives - Home  Mobility - ambulatory     Family History :     Family History  Problem Relation Age of Onset  . Heart disease Father       Home Medications:   Prior to Admission medications  Medication Sig Start Date End Date Taking? Authorizing Provider  ALPRAZolam (XANAX) 0.5 MG tablet TAKE 1 TABLET AT BEDTIME AS NEEDED FOR ANXIETY. Patient taking differently: TAKE 1 TABLET AT BEDTIME 06/13/15  Yes [name], MD  atenolol (TENORMIN) 25 MG tablet Take 1 tablet (25 mg total) by mouth daily. 06/13/15  Yes [name], MD  folic acid (FOLVITE) [id number] MCG tablet Take 800 mcg by mouth daily.   Yes Historical Provider, MD  methotrexate (RHEUMATREX) 2.5 MG tablet Take 6 tablets per week. Patient taking differently: Take 8 tablets per week. 04/09/14  Yes [name], MD  OVER THE COUNTER MEDICATION Take 2 capsules by mouth 2 (two) times daily. "LunaRich" all natural supplement   Yes Historical Provider, MD  PARoxetine (PAXIL) 10 MG tablet Take 1 tablet (10 mg total) by mouth every morning. 06/13/15  Yes [name], MD  vitamin C (ASCORBIC ACID) 500 MG tablet Take 500 mg by mouth daily.    Yes Historical Provider, MD     Allergies:     Allergies  Allergen Reactions  . Pneumovax [Pneumococcal Polysaccharide Vaccine] Swelling     Physical Exam:    Vitals  Blood pressure 133/59, pulse 52, temperature 97.9 F (36.6 C), temperature source Oral, resp. rate 16, height 5' 5.5" (1.664 m), weight 64.5 kg (142 lb 3.2 oz), SpO2 95 %.   1. General Well-developed female lying in bed in NAD,    2. Normal affect and insight, Not Suicidal or Homicidal, Awake Alert, Oriented X 3.  3. No F.N deficits, ALL C.Nerves Intact, Strength 5/5 all 4 extremities, Sensation intact all 4 extremities, Plantars down going.  4. Ears and Eyes appear Normal, Conjunctivae clear, PERRLA. Moist Oral Mucosa.  5. Supple Neck, No JVD, No cervical lymphadenopathy appriciated, No Carotid Bruits.  6. Symmetrical Chest wall movement, Good air movement bilaterally, CTAB.  7. RRR, No Gallops, Rubs or Murmurs, No Parasternal Heave.  8. Positive Bowel Sounds, Abdomen Soft, No tenderness, No organomegaly appriciated,No rebound -guarding or rigidity.  9.  No Cyanosis, Normal Skin Turgor, No Skin Rash or Bruise.  10. Good muscle tone,  joints appear normal , no effusions, Normal ROM.  11. No Palpable Lymph Nodes in Neck or Axillae     Data Review:    CBC  Recent Labs Lab 07/19/15 2225  WBC 8.3  HGB 13.9  HCT 41.9  PLT 272  MCV 90.0  MCH 29.8  MCHC 33.1  RDW 15.1*   ------------------------------------------------------------------------------------------------------------------  Chemistries   Recent Labs Lab 07/19/15 2225  NA 141  K 3.7  CL 105  CO2 28  GLUCOSE 116*  BUN 22*  CREATININE 0.84  CALCIUM 9.6   ------------------------------------------------------------------------------------------------------------------ estimated creatinine clearance is 51.5 mL/min (by C-G formula based on Cr of 0.84). ------------------------------------------------------------------------------------------------------------------ No results for input(s): TSH, T4TOTAL, T3FREE, THYROIDAB in the last 72 hours.  Invalid input(s): FREET3  Coagulation  profile No results for input(s): INR, PROTIME in the last 168 hours. ------------------------------------------------------------------------------------------------------------------- No results for input(s): DDIMER in the last 72 hours. -------------------------------------------------------------------------------------------------------------------  Cardiac Enzymes  Recent Labs Lab 07/19/15 2225  TROPONINI <0.03   ------------------------------------------------------------------------------------------------------------------ No results found for: BNP   ---------------------------------------------------------------------------------------------------------------  Urinalysis    Component Value Date/Time   COLORURINE Dark Yellow 01/31/2013 0929   COLORURINE Straw 03/27/2011 1601   APPEARANCEUR CLEAR 01/31/2013 0929   APPEARANCEUR Clear 03/27/2011 1601   LABSPEC >=1.030 01/31/2013 0929   LABSPEC 1.004 03/27/2011 1601   PHURINE 5.5 01/31/2013 0929   PHURINE 6.0 03/27/2011 1601  GLUCOSEU NEGATIVE 01/31/2013 0929   GLUCOSEU Negative 03/27/2011 1601   HGBUR NEGATIVE 01/31/2013 0929   HGBUR 1+ 03/27/2011 1601   BILIRUBINUR NEGATIVE 01/31/2013 0929   BILIRUBINUR Negative 03/27/2011 1601   KETONESUR NEGATIVE 01/31/2013 0929   KETONESUR Negative 03/27/2011 1601   PROTEINUR Negative 03/27/2011 1601   UROBILINOGEN 0.2 01/31/2013 0929   NITRITE NEGATIVE 01/31/2013 0929   NITRITE Negative 03/27/2011 1601   LEUKOCYTESUR NEGATIVE 01/31/2013 0929   LEUKOCYTESUR Negative 03/27/2011 1601    ----------------------------------------------------------------------------------------------------------------   Imaging Results:    Ct Head Wo Contrast  07/19/2015  CLINICAL DATA:  Headache. EXAM: CT HEAD WITHOUT CONTRAST TECHNIQUE: Contiguous axial images were obtained from the base of the skull through the vertex without intravenous contrast. COMPARISON:  10/30/1999 FINDINGS: Brain: No  evidence of acute infarction, hemorrhage, extra-axial collection, ventriculomegaly, or mass effect. Vascular: No hyperdense vessel. Vascular calcifications at the skullbase. Skull: Negative for fracture or focal lesion. Sinuses/Orbits: No acute findings. Other: None. IMPRESSION: No acute intracranial abnormality. Electronically Signed   By: Fidela Salisbury M.D.   On: 07/19/2015 22:56   Mr Jodene Nam Head Wo Contrast  07/20/2015  CLINICAL DATA:  Initial evaluation for acute headache. EXAM: MRI HEAD WITHOUT AND WITH CONTRAST MRA HEAD WITHOUT CONTRAST MRA NECK WITHOUT AND WITH CONTRAST TECHNIQUE: Multiplanar, multiecho pulse sequences of the brain and surrounding structures were obtained without and with intravenous contrast. Angiographic images of the Circle of Willis were obtained using MRA technique without intravenous contrast. Angiographic images of the neck were obtained using MRA technique without and with intravenous contrast. Carotid stenosis measurements (when applicable) are obtained utilizing NASCET criteria, using the distal internal carotid diameter as the denominator. CONTRAST:  25mL MULTIHANCE GADOBENATE DIMEGLUMINE 529 MG/ML IV SOLN COMPARISON:  Prior CT from 07/19/2015. FINDINGS: MRI HEAD FINDINGS Mild diffuse prominence of the CSF containing spaces compatible with generalized age-related cerebral atrophy. Mild confluent T2 hyperintensity within the periventricular white matter like related to minimal chronic small vessel ischemic changes, felt to be within normal limits for patient age. Few small remote lacunar infarcts present within the left caudate. Additional remote lacunar infarct within the right thalamus. Possible additional remote lacunar infarct within the right lentiform nucleus. Additional cystic lucencies within the basal ganglia likely reflect dilated perivascular spaces. No evidence for remote cortical or cerebellar infarct. No abnormal foci of restricted diffusion to suggest acute  intracranial infarct. Gray-white matter differentiation maintained. Major intracranial vascular flow voids are preserved. No acute or chronic intracranial hemorrhage. No mass lesion, midline shift, or mass effect. No hydrocephalus. No extra-axial fluid collection. Major dural sinuses are grossly patent. No abnormal enhancement. Craniocervical junction within normal limits. Visualized upper cervical spine unremarkable. Pituitary gland within normal limits. No acute abnormality about the orbits. Paranasal sinuses are largely clear. No mastoid effusion. Inner ear structures grossly normal. Bone marrow signal intensity within normal limits. No scalp soft tissue abnormality. MRA HEAD FINDINGS ANTERIOR CIRCULATION: Study is degraded by motion artifact, or opacification of the vessels on 3D reconstructions. Visualized distal cervical segments of the internal carotid arteries are patent with antegrade flow. Petrous, cavernous, and supraclinoid segments widely patent without flow limiting stenosis. A1 segments patent bilaterally. Anterior communicating artery normal. Anterior cerebral arteries well opacified to their distal aspects. M1 segments widely patent proximally without stenosis or occlusion. Distal M1 segments not well evaluated on this exam, but are grossly patent. MCA bifurcations not well evaluated. Distal MCA branches fairly symmetric and well opacified, although are not well evaluated proximally due to artifact  on this exam POSTERIOR CIRCULATION: Left vertebral artery dominant and widely patent to the vertebrobasilar junction. Diminutive right vertebral artery grossly patent as well, although it was not attenuated distally. This is likely artifactual on this exam, as this appears patent on corresponding MRA of the neck. Basilar artery widely patent to its distal aspect. Superior cerebral arteries patent bilaterally. Both of the posterior cerebral arteries arise from the basilar artery and are well opacified to  their distal aspects. Small left posterior communicating artery marrow multifocal moderate to severe stenoses noted within the P2 segments bilaterally, left worse than right. Small left posterior communicating artery noted. No aneurysm or vascular malformation. MRA NECK FINDINGS Visualized aortic arch of normal caliber with normal branch pattern. No high-grade stenosis at the origin of the great vessels. Right common carotid artery patent from its origin to the bifurcation. Mild atheromatous irregularity about the right bifurcation/proximal right ICA without significant stenosis. Right ICA well opacified distally to the circle of Willis without flow-limiting stenosis, dissection, or vascular occlusion. Left common carotid artery patent from its origin to the bifurcation. Atheromatous irregularity about the left bifurcation/proximal left ICA without definite flow limiting stenosis. Left ICA patent from the bifurcation to the skullbase without stenosis, dissection, or occlusion. Note made of moderate narrowing of the proximal left external carotid artery. Both vertebral arteries arise from the subclavian arteries. Left vertebral artery is dominant. There is question of mild to moderate narrowing at the origin of the left vertebral artery (series 9, image 20). Vertebral arteries otherwise patent within the neck without stenosis, dissection, or occlusion. IMPRESSION: MRI HEAD IMPRESSION: 1. No acute intracranial process identified. 2. Small remote lacunar infarcts involving the bilateral basal ganglia and right thalamus as above. 3. Mild age-related cerebral atrophy. MRA HEAD IMPRESSION: 1. Motion degraded study. No large or proximal arterial branch occlusion identified. No correctable stenosis. 2. 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 Please note that these stenoses may in part be related to motion artifact on this exam. No other definite focal high-grade stenosis  identified on this limited exam. MRA NECK IMPRESSION: 1. No high-grade or critical stenosis identified within the major arterial vasculature of the neck. 2. Mild atheromatous irregularity about the carotid bifurcations/proximal ICAs bilaterally without high-grade flow-limiting stenosis, slightly worse on the left. 3. Question mild to moderate stenosis at the origin of the dominant left vertebral artery. Vertebral arteries otherwise widely patent within the neck. 4. Incidental note made of moderate stenosis of the proximal left external carotid artery. Electronically Signed   By: Jeannine Boga M.D.   On: 07/20/2015 02:26   Mr Angiogram Neck W Wo Contrast  07/20/2015  CLINICAL DATA:  Initial evaluation for acute headache. EXAM: MRI HEAD WITHOUT AND WITH CONTRAST MRA HEAD WITHOUT CONTRAST MRA NECK WITHOUT AND WITH CONTRAST TECHNIQUE: Multiplanar, multiecho pulse sequences of the brain and surrounding structures were obtained without and with intravenous contrast. Angiographic images of the Circle of Willis were obtained using MRA technique without intravenous contrast. Angiographic images of the neck were obtained using MRA technique without and with intravenous contrast. Carotid stenosis measurements (when applicable) are obtained utilizing NASCET criteria, using the distal internal carotid diameter as the denominator. CONTRAST:  34mL MULTIHANCE GADOBENATE DIMEGLUMINE 529 MG/ML IV SOLN COMPARISON:  Prior CT from 07/19/2015. FINDINGS: MRI HEAD FINDINGS Mild diffuse prominence of the CSF containing spaces compatible with generalized age-related cerebral atrophy. Mild confluent T2 hyperintensity within the periventricular white matter like related to minimal chronic small vessel ischemic  changes, felt to be within normal limits for patient age. Few small remote lacunar infarcts present within the left caudate. Additional remote lacunar infarct within the right thalamus. Possible additional remote lacunar  infarct within the right lentiform nucleus. Additional cystic lucencies within the basal ganglia likely reflect dilated perivascular spaces. No evidence for remote cortical or cerebellar infarct. No abnormal foci of restricted diffusion to suggest acute intracranial infarct. Gray-white matter differentiation maintained. Major intracranial vascular flow voids are preserved. No acute or chronic intracranial hemorrhage. No mass lesion, midline shift, or mass effect. No hydrocephalus. No extra-axial fluid collection. Major dural sinuses are grossly patent. No abnormal enhancement. Craniocervical junction within normal limits. Visualized upper cervical spine unremarkable. Pituitary gland within normal limits. No acute abnormality about the orbits. Paranasal sinuses are largely clear. No mastoid effusion. Inner ear structures grossly normal. Bone marrow signal intensity within normal limits. No scalp soft tissue abnormality. MRA HEAD FINDINGS ANTERIOR CIRCULATION: Study is degraded by motion artifact, or opacification of the vessels on 3D reconstructions. Visualized distal cervical segments of the internal carotid arteries are patent with antegrade flow. Petrous, cavernous, and supraclinoid segments widely patent without flow limiting stenosis. A1 segments patent bilaterally. Anterior communicating artery normal. Anterior cerebral arteries well opacified to their distal aspects. M1 segments widely patent proximally without stenosis or occlusion. Distal M1 segments not well evaluated on this exam, but are grossly patent. MCA bifurcations not well evaluated. Distal MCA branches fairly symmetric and well opacified, although are not well evaluated proximally due to artifact on this exam POSTERIOR CIRCULATION: Left vertebral artery dominant and widely patent to the vertebrobasilar junction. Diminutive right vertebral artery grossly patent as well, although it was not attenuated distally. This is likely artifactual on this  exam, as this appears patent on corresponding MRA of the neck. Basilar artery widely patent to its distal aspect. Superior cerebral arteries patent bilaterally. Both of the posterior cerebral arteries arise from the basilar artery and are well opacified to their distal aspects. Small left posterior communicating artery marrow multifocal moderate to severe stenoses noted within the P2 segments bilaterally, left worse than right. Small left posterior communicating artery noted. No aneurysm or vascular malformation. MRA NECK FINDINGS Visualized aortic arch of normal caliber with normal branch pattern. No high-grade stenosis at the origin of the great vessels. Right common carotid artery patent from its origin to the bifurcation. Mild atheromatous irregularity about the right bifurcation/proximal right ICA without significant stenosis. Right ICA well opacified distally to the circle of Willis without flow-limiting stenosis, dissection, or vascular occlusion. Left common carotid artery patent from its origin to the bifurcation. Atheromatous irregularity about the left bifurcation/proximal left ICA without definite flow limiting stenosis. Left ICA patent from the bifurcation to the skullbase without stenosis, dissection, or occlusion. Note made of moderate narrowing of the proximal left external carotid artery. Both vertebral arteries arise from the subclavian arteries. Left vertebral artery is dominant. There is question of mild to moderate narrowing at the origin of the left vertebral artery (series 9, image 20). Vertebral arteries otherwise patent within the neck without stenosis, dissection, or occlusion. IMPRESSION: MRI HEAD IMPRESSION: 1. No acute intracranial process identified. 2. Small remote lacunar infarcts involving the bilateral basal ganglia and right thalamus as above. 3. Mild age-related cerebral atrophy. MRA HEAD IMPRESSION: 1. Motion degraded study. No large or proximal arterial branch occlusion  identified. No correctable stenosis. 2. 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 Please note that these stenoses may in part  be related to motion artifact on this exam. No other definite focal high-grade stenosis identified on this limited exam. MRA NECK IMPRESSION: 1. No high-grade or critical stenosis identified within the major arterial vasculature of the neck. 2. Mild atheromatous irregularity about the carotid bifurcations/proximal ICAs bilaterally without high-grade flow-limiting stenosis, slightly worse on the left. 3. Question mild to moderate stenosis at the origin of the dominant left vertebral artery. Vertebral arteries otherwise widely patent within the neck. 4. Incidental note made of moderate stenosis of the proximal left external carotid artery. Electronically Signed   By: Jeannine Boga M.D.   On: 07/20/2015 02:26   Mr Jeri Cos [id number] Contrast  07/20/2015  CLINICAL DATA:  Initial evaluation for acute headache. EXAM: MRI HEAD WITHOUT AND WITH CONTRAST MRA HEAD WITHOUT CONTRAST MRA NECK WITHOUT AND WITH CONTRAST TECHNIQUE: Multiplanar, multiecho pulse sequences of the brain and surrounding structures were obtained without and with intravenous contrast. Angiographic images of the Circle of Willis were obtained using MRA technique without intravenous contrast. Angiographic images of the neck were obtained using MRA technique without and with intravenous contrast. Carotid stenosis measurements (when applicable) are obtained utilizing NASCET criteria, using the distal internal carotid diameter as the denominator. CONTRAST:  5mL MULTIHANCE GADOBENATE DIMEGLUMINE 529 MG/ML IV SOLN COMPARISON:  Prior CT from 07/19/2015. FINDINGS: MRI HEAD FINDINGS Mild diffuse prominence of the CSF containing spaces compatible with generalized age-related cerebral atrophy. Mild confluent T2 hyperintensity within the periventricular white matter like related to minimal chronic  small vessel ischemic changes, felt to be within normal limits for patient age. Few small remote lacunar infarcts present within the left caudate. Additional remote lacunar infarct within the right thalamus. Possible additional remote lacunar infarct within the right lentiform nucleus. Additional cystic lucencies within the basal ganglia likely reflect dilated perivascular spaces. No evidence for remote cortical or cerebellar infarct. No abnormal foci of restricted diffusion to suggest acute intracranial infarct. Gray-white matter differentiation maintained. Major intracranial vascular flow voids are preserved. No acute or chronic intracranial hemorrhage. No mass lesion, midline shift, or mass effect. No hydrocephalus. No extra-axial fluid collection. Major dural sinuses are grossly patent. No abnormal enhancement. Craniocervical junction within normal limits. Visualized upper cervical spine unremarkable. Pituitary gland within normal limits. No acute abnormality about the orbits. Paranasal sinuses are largely clear. No mastoid effusion. Inner ear structures grossly normal. Bone marrow signal intensity within normal limits. No scalp soft tissue abnormality. MRA HEAD FINDINGS ANTERIOR CIRCULATION: Study is degraded by motion artifact, or opacification of the vessels on 3D reconstructions. Visualized distal cervical segments of the internal carotid arteries are patent with antegrade flow. Petrous, cavernous, and supraclinoid segments widely patent without flow limiting stenosis. A1 segments patent bilaterally. Anterior communicating artery normal. Anterior cerebral arteries well opacified to their distal aspects. M1 segments widely patent proximally without stenosis or occlusion. Distal M1 segments not well evaluated on this exam, but are grossly patent. MCA bifurcations not well evaluated. Distal MCA branches fairly symmetric and well opacified, although are not well evaluated proximally due to artifact on this exam  POSTERIOR CIRCULATION: Left vertebral artery dominant and widely patent to the vertebrobasilar junction. Diminutive right vertebral artery grossly patent as well, although it was not attenuated distally. This is likely artifactual on this exam, as this appears patent on corresponding MRA of the neck. Basilar artery widely patent to its distal aspect. Superior cerebral arteries patent bilaterally. Both of the posterior cerebral arteries arise from the basilar artery and are well opacified to their distal  aspects. Small left posterior communicating artery marrow multifocal moderate to severe stenoses noted within the P2 segments bilaterally, left worse than right. Small left posterior communicating artery noted. No aneurysm or vascular malformation. MRA NECK FINDINGS Visualized aortic arch of normal caliber with normal branch pattern. No high-grade stenosis at the origin of the great vessels. Right common carotid artery patent from its origin to the bifurcation. Mild atheromatous irregularity about the right bifurcation/proximal right ICA without significant stenosis. Right ICA well opacified distally to the circle of Willis without flow-limiting stenosis, dissection, or vascular occlusion. Left common carotid artery patent from its origin to the bifurcation. Atheromatous irregularity about the left bifurcation/proximal left ICA without definite flow limiting stenosis. Left ICA patent from the bifurcation to the skullbase without stenosis, dissection, or occlusion. Note made of moderate narrowing of the proximal left external carotid artery. Both vertebral arteries arise from the subclavian arteries. Left vertebral artery is dominant. There is question of mild to moderate narrowing at the origin of the left vertebral artery (series 9, image 20). Vertebral arteries otherwise patent within the neck without stenosis, dissection, or occlusion. IMPRESSION: MRI HEAD IMPRESSION: 1. No acute intracranial process identified.  2. Small remote lacunar infarcts involving the bilateral basal ganglia and right thalamus as above. 3. Mild age-related cerebral atrophy. MRA HEAD IMPRESSION: 1. Motion degraded study. No large or proximal arterial branch occlusion identified. No correctable stenosis. 2. 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 Please note that these stenoses may in part be related to motion artifact on this exam. No other definite focal high-grade stenosis identified on this limited exam. MRA NECK IMPRESSION: 1. No high-grade or critical stenosis identified within the major arterial vasculature of the neck. 2. Mild atheromatous irregularity about the carotid bifurcations/proximal ICAs bilaterally without high-grade flow-limiting stenosis, slightly worse on the left. 3. Question mild to moderate stenosis at the origin of the dominant left vertebral artery. Vertebral arteries otherwise widely patent within the neck. 4. Incidental note made of moderate stenosis of the proximal left external carotid artery. Electronically Signed   By: Jeannine Boga M.D.   On: 07/20/2015 02:26    My personal review of EKG: Rhythm NSR, Rate  62 /min, QTc 422 , no Acute ST changes   Assessment & Plan:    Active Problems:   Hypercholesterolemia   Anxiety   PMR (polymyalgia rheumatica) (HCC)   TIA (transient ischemic attack)  TIA - Patient presents with transient symptoms including headache, left hand and feet numbness, and headache, MRI brain negative for acute CVA,, headache etiology may be related to TIA, versus complicated migraine(showing no history of migraine, but usually with different presentation, no recurrence for a few years now). - Neurology consult appreciated, will proceed with TIA workup, will obtain 2-D echo, will obtain carotid Doppler, lipid panel, hemoglobin A1c, will obtain CTA head and neck for further evaluation for right vertebral artery stenosis per neuro. - Start on  full dose aspirin - Continue to monitor on telemetry  Hypertension - Vernard Gambles does not carry diagnosis of hypertension, reports she is on atenolol for migraine control, reports blood pressures were controlled at home, blood pressure is significantly elevated on presentation, this is most likely stress related from her symptoms, will monitor closely.  Anxiety - Continue with when necessary Xanax  PMR - Continue to hold methotrexate  DVT Prophylaxis Heparin SCD  AM Labs Ordered, also please review Full Orders  Family Communication: Admission, patients condition and plan of care including tests  being ordered have been discussed with the patient and Daughter who indicate understanding and agree with the plan and Code Status.  Code Status Full  Likely DC to  Home  Condition GUARDED    Consults called: Neurology   Admission status: Observation  Time spent in minutes : 55 minutes   Drayson Dorko M.D on 07/20/2015 at 10:18 AM  Between 7am to 7pm - Pager - [phone]. After 7pm go to [email or web address] - password [email or web address]  [facility] - Office  [phone]

## 2015-07-20 NOTE — ED Notes (Signed)
Patient report called to [place] room 7 RN. Advised of presenting c/o, assessment, treatment, and transfer POC to their facility. Advised to return call to [facility] if any questions arise pertaining to the care that patient received at [facility].

## 2015-07-20 NOTE — Progress Notes (Signed)
VASCULAR LAB PRELIMINARY  PRELIMINARY  PRELIMINARY  PRELIMINARY  Carotid duplex completed.     Bilateral:  1-39% ICA stenosis.  Vertebral artery flow is antegrade.      Conna Terada, RVT, RDMS 07/20/2015, 3:02 PM

## 2015-07-20 NOTE — Progress Notes (Signed)
Pt to CT.   daguther downstairs. Service dog placed in stroller.

## 2015-07-20 NOTE — ED Notes (Signed)
Pain has improved; 4/10 at present. Patient's daughter is an Engineer, production; reporting that she works in the [place] center and is going down to rest in a chemo chair. This RN offered open bed in CDU. Daughter to rest in CDU for a bit as she has to work in the morning. ED charge nurse aware.

## 2015-07-20 NOTE — Progress Notes (Signed)
Pt to vascular.

## 2015-07-20 NOTE — Progress Notes (Signed)
Pt back from CT

## 2015-07-20 NOTE — ED Notes (Signed)
Blood pressure continues to be elevated. MD made aware. Order for Clonidine 0.1mg  PO and ASA 325mg  EC x 1 dose now. Orders to be entered and carried by this RN.

## 2015-07-23 ENCOUNTER — Telehealth: Payer: Self-pay | Admitting: *Deleted

## 2015-07-23 ENCOUNTER — Encounter: Payer: Self-pay | Admitting: Family Medicine

## 2015-07-23 ENCOUNTER — Other Ambulatory Visit (HOSPITAL_COMMUNITY): Payer: Self-pay | Admitting: Neurology

## 2015-07-23 ENCOUNTER — Ambulatory Visit (INDEPENDENT_AMBULATORY_CARE_PROVIDER_SITE_OTHER): Payer: Medicare Other | Admitting: Family Medicine

## 2015-07-23 VITALS — BP 158/54 | HR 53 | Temp 97.7°F | Ht 65.5 in

## 2015-07-23 DIAGNOSIS — I77810 Thoracic aortic ectasia: Secondary | ICD-10-CM

## 2015-07-23 DIAGNOSIS — I6522 Occlusion and stenosis of left carotid artery: Secondary | ICD-10-CM

## 2015-07-23 DIAGNOSIS — I6509 Occlusion and stenosis of unspecified vertebral artery: Secondary | ICD-10-CM | POA: Diagnosis not present

## 2015-07-23 DIAGNOSIS — I10 Essential (primary) hypertension: Secondary | ICD-10-CM | POA: Diagnosis not present

## 2015-07-23 DIAGNOSIS — I771 Stricture of artery: Secondary | ICD-10-CM

## 2015-07-23 LAB — COMPREHENSIVE METABOLIC PANEL
ALBUMIN: 4.5 g/dL (ref 3.5–5.2)
ALT: 26 U/L (ref 0–35)
AST: 36 U/L (ref 0–37)
Alkaline Phosphatase: 66 U/L (ref 39–117)
BUN: 16 mg/dL (ref 6–23)
CHLORIDE: 104 meq/L (ref 96–112)
CO2: 24 mEq/L (ref 19–32)
Calcium: 9.9 mg/dL (ref 8.4–10.5)
Creatinine, Ser: 0.74 mg/dL (ref 0.40–1.20)
GFR: 80.69 mL/min (ref >60.00)
GLUCOSE: 93 mg/dL (ref 70–99)
POTASSIUM: 4.2 meq/L (ref 3.5–5.1)
SODIUM: 138 meq/L (ref 135–145)
Total Bilirubin: 0.5 mg/dL (ref 0.2–1.2)
Total Protein: 6.8 g/dL (ref 6.0–8.3)

## 2015-07-23 MED ORDER — AMLODIPINE BESYLATE 5 MG PO TABS: 5.0000 mg | ORAL_TABLET | Freq: Every day | ORAL | Status: DC

## 2015-07-23 NOTE — Patient Instructions (Signed)
Nice to meet you. We're going to start you on amlodipine for your blood pressure. We are also going to check some lab work to ensure you Have no kidney injury or liver injury related to your blood pressure. We will make sure you have follow-up with interventional radiology at [facility]. If you develop chest pain, shortness of breath, numbness, weakness, vision changes, swelling in her ankles, or any new or change in symptoms please seek medical attention immediately.

## 2015-07-23 NOTE — Progress Notes (Signed)
Pre visit review using our clinic review tool, if applicable. No additional management support is needed unless otherwise documented below in the visit note. 

## 2015-07-23 NOTE — Telephone Encounter (Signed)
Patient was seen in the office, she was given a Rx for blood pressure medication(amlodipine), how ever she has a BP of 162/92. Pt was advise to call the office, if BP went over 150/90. Daughter questioned if she should take another pill.

## 2015-07-23 NOTE — Telephone Encounter (Signed)
Patients daughter would like to have her scheduled, in a week or 2 to follow up on her migraine headaches.

## 2015-07-23 NOTE — Telephone Encounter (Signed)
Per Dr Caryl Bis it is going to take a couple days for patients blood pressure to normalize.  Patient is aware.  Informed her as well that in order for [place] to talk to her daughter she needs to sign a designated party release form.  Advised patient to take her medication at the same time every day and to check her blood pressure at the same time every day and if it gets 180/110 then call us or go to ER.

## 2015-07-24 ENCOUNTER — Encounter: Payer: Self-pay | Admitting: Family Medicine

## 2015-07-24 ENCOUNTER — Ambulatory Visit (HOSPITAL_COMMUNITY)
Admission: RE | Admit: 2015-07-24 | Discharge: 2015-07-24 | Disposition: A | Discharge status: Home / Self Care | Payer: Medicare Other | Source: Ambulatory Visit | Attending: Neurology | Admitting: Neurology

## 2015-07-24 DIAGNOSIS — I779 Disorder of arteries and arterioles, unspecified: Secondary | ICD-10-CM | POA: Insufficient documentation

## 2015-07-24 DIAGNOSIS — I77810 Thoracic aortic ectasia: Secondary | ICD-10-CM | POA: Insufficient documentation

## 2015-07-24 DIAGNOSIS — I6529 Occlusion and stenosis of unspecified carotid artery: Secondary | ICD-10-CM | POA: Insufficient documentation

## 2015-07-24 DIAGNOSIS — G459 Transient cerebral ischemic attack, unspecified: Secondary | ICD-10-CM | POA: Diagnosis not present

## 2015-07-24 DIAGNOSIS — I771 Stricture of artery: Secondary | ICD-10-CM

## 2015-07-24 NOTE — Assessment & Plan Note (Addendum)
Found on imaging during workup of TIA. Recommendation is for repeat imaging in 1 year. Will forward note to patient's PCP.

## 2015-07-24 NOTE — Telephone Encounter (Signed)
Reviewed hospital note and neurology consult note.  Also, saw Dr Caryl Bis yesterday for f/u.  Reviewed scans, found to have some stenosis at bulb of internal carotid.  They had recommended f/u with interventional radiology.  I would like for her to see vascular surgery.  If agreeable, let me know and I will place the order for the referral.  Also, need to know where she prefers to see vascular surgery.  (here in [place], [place], etc).  Just let me know if has preference.

## 2015-07-24 NOTE — Telephone Encounter (Signed)
Message has been addressed. Please see original message to schedule pt a follow-up per Dr. Nicki Reaper

## 2015-07-24 NOTE — Telephone Encounter (Signed)
Pt blood pressure is still running around 200 even with the medicine  Dr. Caryl Bis gave her earlier this week.. Pt daughter was told that they need to follow up with PCP.Marland Kitchenthere is an opening  Tomorrow @ 12:30 or I  Scheduled her for June 1st.. Please advise pt daughter Maggie Font at [phone] with any questions

## 2015-07-24 NOTE — Assessment & Plan Note (Signed)
Found on workup in hospital will refer to interventional radiology as recommended.

## 2015-07-24 NOTE — Progress Notes (Signed)
Patient ID: Jessyka Austria, female   DOB: [birth year], 80 y.o.   MRN: [id number]  Tommi Rumps, MD Phone: [phone]  Geniene List is a 80 y.o. female who presents today for follow-up.  Patient was hospitalized last week after experiencing numbness in her left foot and hand. She went to the emergency room and was transferred to [facility] in [place] for workup. They got all her tests back and discharged her with a diagnosis of TIA to follow-up with her PCP for her blood pressure and then interventional radiology for a left carotid blockage. Notes this morning her blood pressure is 243/109. Occur atenolol and rechecked is 168/75. Intermittently since her hospitalization her blood pressures have ranged from 147-200/83-103. She has been asymptomatic today. No chest pain, shortness of breath, numbness, weakness, vision changes, edema, or abdominal pain. She reports no prior blood pressure issues. She feels well at this time. She's not had any recurrence of the numbness. She does report she has been on atenolol for migraine prophylaxis though was advised in the hospital. This given her low heart rate.  PMH: nonsmoker.   ROS see history of present illness  Objective  Physical Exam Filed Vitals:   07/23/15 1138  BP: 158/54  Pulse: 53  Temp: 97.7 F (36.5 C)    BP Readings from Last 3 Encounters:  07/23/15 158/54  07/20/15 159/48  07/20/15 101/41   Wt Readings from Last 3 Encounters:  07/20/15 142 lb 3.2 oz (64.5 kg)  07/19/15 140 lb (63.504 kg)  07/19/15 140 lb (63.504 kg)    Physical Exam  Constitutional: She is well-developed, well-nourished, and in no distress.  HENT:  Head: Normocephalic and atraumatic.  Right Ear: External ear normal.  Left Ear: External ear normal.  Mouth/Throat: Oropharynx is clear and moist.  Eyes: Conjunctivae are normal. Pupils are equal, round, and reactive to light.  Neck: Neck supple.  Cardiovascular: Normal rate, regular rhythm and normal heart  sounds.   No carotid bruits  Pulmonary/Chest: Effort normal and breath sounds normal.  Lymphadenopathy:    She has no cervical adenopathy.  Neurological: She is alert.  CN 2-12 intact, 5/5 strength in bilateral biceps, triceps, grip, quads, hamstrings, plantar and dorsiflexion, sensation to light touch intact in bilateral UE and LE, normal gait, 2+ patellar reflexes  Skin: Skin is warm and dry. She is not diaphoretic.     Assessment/Plan: Please see individual problem list.  Essential hypertension Blood pressure significantly elevated earlier today. Closer to the normal range at this time. Asymptomatic. No signs of end organ damage on history or exam. We'll start the patient on amlodipine. She will stop her atenolol given her low heart rate. We'll check a CMP to evaluate liver and renal function. Her daughter is a Marine scientist and will check her blood pressure at home over the next week. If greater than 180/110 they will let us know. If in the next week and does not come down below 140/90 they will let us know. Follow-up in a month with PCP.  Carotid stenosis Found on workup in hospital will refer to interventional radiology as recommended.  Ectatic thoracic aorta ([place]) Found on imaging during workup of TIA. Recommendation is for repeat imaging in 1 year. Will forward note to patient's PCP.    Orders Placed This Encounter  Procedures  . Comp Met (CMET)  . Ambulatory referral to Interventional Radiology    Referral Priority:  Routine    Referral Type:  Consultation    Referral Reason:  Specialty Services Required    Requested Specialty:  Interventional Radiology    Number of Visits Requested:  1    Meds ordered this encounter  Medications  . amLODipine (NORVASC) 5 MG tablet    Sig: Take 1 tablet (5 mg total) by mouth daily.    Dispense:  90 tablet    Refill:  [place], MD [place]

## 2015-07-24 NOTE — Assessment & Plan Note (Signed)
Blood pressure significantly elevated earlier today. Closer to the normal range at this time. Asymptomatic. No signs of end organ damage on history or exam. We'll start the patient on amlodipine. She will stop her atenolol given her low heart rate. We'll check a CMP to evaluate liver and renal function. Her daughter is a Marine scientist and will check her blood pressure at home over the next week. If greater than 180/110 they will let us know. If in the next week and does not come down below 140/90 they will let us know. Follow-up in a month with PCP.

## 2015-07-24 NOTE — Telephone Encounter (Signed)
Left detailed message on daughters phone [facility]).

## 2015-07-24 NOTE — Telephone Encounter (Signed)
If she is still running blood pressures this high, needs evaluation.  If 200 or greater, then recommend ER.  Since I have opening tomorrow at 12:30 - also see if can come in then.  Thanks

## 2015-07-25 NOTE — Telephone Encounter (Signed)
Follow up  made for June 1st at 4:30

## 2015-07-26 ENCOUNTER — Other Ambulatory Visit (HOSPITAL_COMMUNITY): Payer: Self-pay | Admitting: Interventional Radiology

## 2015-07-26 ENCOUNTER — Telehealth (HOSPITAL_COMMUNITY): Payer: Self-pay

## 2015-07-26 DIAGNOSIS — I771 Stricture of artery: Secondary | ICD-10-CM

## 2015-07-26 NOTE — Telephone Encounter (Signed)
Called to schedule angiogram, left message for pt to call back. AW

## 2015-07-31 ENCOUNTER — Telehealth (HOSPITAL_COMMUNITY): Payer: Self-pay | Admitting: *Deleted

## 2015-07-31 NOTE — Telephone Encounter (Signed)
Called to cancel appointment, Dr. Estanislado Pandy has a family emergency,  Please call daughter to reschedule appointment

## 2015-08-02 ENCOUNTER — Ambulatory Visit
Admission: RE | Admit: 2015-08-02 | Payer: Medicare Other | Source: Ambulatory Visit | Admitting: Unknown Physician Specialty

## 2015-08-02 ENCOUNTER — Encounter: Admission: RE | Payer: Self-pay | Source: Ambulatory Visit

## 2015-08-02 ENCOUNTER — Ambulatory Visit: Payer: Medicare Other | Admitting: Internal Medicine

## 2015-08-02 SURGERY — ESOPHAGOGASTRODUODENOSCOPY (EGD) WITH PROPOFOL
Anesthesia: General

## 2015-08-03 ENCOUNTER — Ambulatory Visit (HOSPITAL_COMMUNITY): Admission: RE | Admit: 2015-08-03 | Payer: Medicare Other | Source: Ambulatory Visit

## 2015-08-08 ENCOUNTER — Other Ambulatory Visit: Payer: Commercial Managed Care - PPO

## 2015-08-10 ENCOUNTER — Other Ambulatory Visit: Payer: Self-pay | Admitting: Radiology

## 2015-08-10 ENCOUNTER — Telehealth: Payer: Self-pay | Admitting: *Deleted

## 2015-08-10 NOTE — Telephone Encounter (Signed)
Patient daughter would like to have her worked in for blood pressure issues and medication. She can not be seen on 08-14-15 because of surgery.  Pt daughter [phone]

## 2015-08-10 NOTE — Telephone Encounter (Signed)
Scheduled appt for 6/15/ @8 :30

## 2015-08-13 ENCOUNTER — Other Ambulatory Visit: Payer: Self-pay | Admitting: Internal Medicine

## 2015-08-13 NOTE — Telephone Encounter (Signed)
Received refill electronically Last refill 06/13/15 #30/1 Last office visit 07/23/15/acute Okay to refill?

## 2015-08-14 ENCOUNTER — Other Ambulatory Visit (HOSPITAL_COMMUNITY): Payer: Self-pay | Admitting: Interventional Radiology

## 2015-08-14 ENCOUNTER — Ambulatory Visit (HOSPITAL_COMMUNITY)
Admission: RE | Admit: 2015-08-14 | Discharge: 2015-08-14 | Disposition: A | Discharge status: Home / Self Care | Payer: Medicare Other | Source: Ambulatory Visit | Attending: Interventional Radiology | Admitting: Interventional Radiology

## 2015-08-14 ENCOUNTER — Ambulatory Visit: Payer: Medicare Other | Admitting: Internal Medicine

## 2015-08-14 ENCOUNTER — Encounter (HOSPITAL_COMMUNITY): Payer: Self-pay

## 2015-08-14 DIAGNOSIS — Z8249 Family history of ischemic heart disease and other diseases of the circulatory system: Secondary | ICD-10-CM | POA: Diagnosis not present

## 2015-08-14 DIAGNOSIS — I771 Stricture of artery: Secondary | ICD-10-CM

## 2015-08-14 DIAGNOSIS — Z7902 Long term (current) use of antithrombotics/antiplatelets: Secondary | ICD-10-CM | POA: Insufficient documentation

## 2015-08-14 DIAGNOSIS — I6523 Occlusion and stenosis of bilateral carotid arteries: Secondary | ICD-10-CM | POA: Insufficient documentation

## 2015-08-14 DIAGNOSIS — I6521 Occlusion and stenosis of right carotid artery: Secondary | ICD-10-CM | POA: Diagnosis present

## 2015-08-14 DIAGNOSIS — H9313 Tinnitus, bilateral: Secondary | ICD-10-CM | POA: Insufficient documentation

## 2015-08-14 DIAGNOSIS — Z79899 Other long term (current) drug therapy: Secondary | ICD-10-CM | POA: Diagnosis not present

## 2015-08-14 DIAGNOSIS — E785 Hyperlipidemia, unspecified: Secondary | ICD-10-CM | POA: Diagnosis not present

## 2015-08-14 DIAGNOSIS — I6612 Occlusion and stenosis of left anterior cerebral artery: Secondary | ICD-10-CM | POA: Diagnosis not present

## 2015-08-14 LAB — BASIC METABOLIC PANEL
ANION GAP: 6 (ref 5–15)
BUN: 19 mg/dL (ref 6–20)
CHLORIDE: 108 mmol/L (ref 101–111)
CO2: 25 mmol/L (ref 22–32)
Calcium: 9.8 mg/dL (ref 8.9–10.3)
Creatinine, Ser: 0.83 mg/dL (ref 0.44–1.00)
Glucose, Bld: 101 mg/dL — ABNORMAL HIGH (ref 65–99)
POTASSIUM: 3.8 mmol/L (ref 3.5–5.1)
SODIUM: 139 mmol/L (ref 135–145)

## 2015-08-14 LAB — CBC
HEMATOCRIT: 42.1 % (ref 36.0–46.0)
Hemoglobin: 13.9 g/dL (ref 12.0–15.0)
MCH: 29.4 pg (ref 26.0–34.0)
MCHC: 33 g/dL (ref 30.0–36.0)
MCV: 89.2 fL (ref 78.0–100.0)
PLATELETS: 323 10*3/uL (ref 150–400)
RBC: 4.72 MIL/uL (ref 3.87–5.11)
RDW: 14.8 % (ref 11.5–15.5)
WBC: 7 10*3/uL (ref 4.0–10.5)

## 2015-08-14 LAB — PROTIME-INR
INR: 1.1 (ref 0.00–1.49)
Prothrombin Time: 14.4 seconds (ref 11.6–15.2)

## 2015-08-14 LAB — APTT: APTT: 30 s (ref 24–37)

## 2015-08-14 MED ORDER — HEPARIN SOD (PORK) LOCK FLUSH 100 UNIT/ML IV SOLN
INTRAVENOUS | Status: AC | PRN
  Administered 2015-08-14: 1000 [IU] via INTRAVENOUS

## 2015-08-14 MED ORDER — HEPARIN SOD (PORK) LOCK FLUSH 100 UNIT/ML IV SOLN
INTRAVENOUS | Status: AC
  Filled 2015-08-14: qty 20

## 2015-08-14 MED ORDER — SODIUM CHLORIDE 0.9 % IV SOLN: INTRAVENOUS | Status: AC

## 2015-08-14 MED ORDER — FENTANYL CITRATE (PF) 100 MCG/2ML IJ SOLN
INTRAMUSCULAR | Status: AC | PRN
  Administered 2015-08-14: 25 ug via INTRAVENOUS

## 2015-08-14 MED ORDER — MIDAZOLAM HCL 2 MG/2ML IJ SOLN
INTRAMUSCULAR | Status: AC
  Filled 2015-08-14: qty 2

## 2015-08-14 MED ORDER — LIDOCAINE HCL 1 % IJ SOLN
INTRAMUSCULAR | Status: AC | PRN
  Administered 2015-08-14: 9 mL

## 2015-08-14 MED ORDER — MIDAZOLAM HCL 2 MG/2ML IJ SOLN
INTRAMUSCULAR | Status: AC | PRN
  Administered 2015-08-14: 1 mg via INTRAVENOUS

## 2015-08-14 MED ORDER — IOPAMIDOL (ISOVUE-300) INJECTION 61%
INTRAVENOUS | Status: AC
  Filled 2015-08-14: qty 50

## 2015-08-14 MED ORDER — FENTANYL CITRATE (PF) 100 MCG/2ML IJ SOLN
INTRAMUSCULAR | Status: AC
  Filled 2015-08-14: qty 2

## 2015-08-14 MED ORDER — IOPAMIDOL (ISOVUE-300) INJECTION 61%
INTRAVENOUS | Status: AC
  Administered 2015-08-14: 75 mL
  Filled 2015-08-14: qty 150

## 2015-08-14 MED ORDER — SODIUM CHLORIDE 0.9 % IV SOLN
Freq: Once | INTRAVENOUS | Status: AC
  Administered 2015-08-14: 09:00:00 via INTRAVENOUS

## 2015-08-14 MED ORDER — LIDOCAINE HCL 1 % IJ SOLN
INTRAMUSCULAR | Status: AC
  Filled 2015-08-14: qty 20

## 2015-08-14 NOTE — Sedation Documentation (Signed)
Right groin clean, dry and intact, no hematoma noted

## 2015-08-14 NOTE — Sedation Documentation (Signed)
Patient is resting comfortably. 

## 2015-08-14 NOTE — Discharge Instructions (Signed)
Angiogram, Care After °Refer to this sheet in the next few weeks. These instructions provide you with information about caring for yourself after your procedure. Your health care provider may also give you more specific instructions. Your treatment has been planned according to current medical practices, but problems sometimes occur. Call your health care provider if you have any problems or questions after your procedure. °WHAT TO EXPECT AFTER THE PROCEDURE °After your procedure, it is typical to have the following: °· Bruising at the catheter insertion site that usually fades within 1-2 weeks. °· Blood collecting in the tissue (hematoma) that may be painful to the touch. It should usually decrease in size and tenderness within 1-2 weeks. °HOME CARE INSTRUCTIONS °· Take medicines only as directed by your health care provider. °· You may shower 24-48 hours after the procedure or as directed by your health care provider. Remove the bandage (dressing) and gently wash the site with plain soap and water. Pat the area dry with a clean towel. Do not rub the site, because this may cause bleeding. °· Do not take baths, swim, or use a hot tub until your health care provider approves. °· Check your insertion site every day for redness, swelling, or drainage. °· Do not apply powder or lotion to the site. °· Do not lift over 10 lb (4.5 kg) for 5 days after your procedure or as directed by your health care provider. °· Ask your health care provider when it is okay to: °¨ Return to work or school. °¨ Resume usual physical activities or sports. °¨ Resume sexual activity. °· Do not drive home if you are discharged the same day as the procedure. Have someone else drive you. °· You may drive 24 hours after the procedure unless otherwise instructed by your health care provider. °· Do not operate machinery or power tools for 24 hours after the procedure or as directed by your health care provider. °· If your procedure was done as an  outpatient procedure, which means that you went home the same day as your procedure, a responsible adult should be with you for the first 24 hours after you arrive home. °· Keep all follow-up visits as directed by your health care provider. This is important. °SEEK MEDICAL CARE IF: °· You have a fever. °· You have chills. °· You have increased bleeding from the catheter insertion site. Hold pressure on the site. °SEEK IMMEDIATE MEDICAL CARE IF: °· You have unusual pain at the catheter insertion site. °· You have redness, warmth, or swelling at the catheter insertion site. °· You have drainage (other than a small amount of blood on the dressing) from the catheter insertion site. °· The catheter insertion site is bleeding, and the bleeding does not stop after 30 minutes of holding steady pressure on the site. °· The area near or just beyond the catheter insertion site becomes pale, cool, tingly, or numb. °  °This information is not intended to replace advice given to you by your health care provider. Make sure you discuss any questions you have with your health care provider. °  °Document Released: 09/05/2004 Document Revised: 03/10/2014 Document Reviewed: 07/21/2012 °Elsevier Interactive Patient Education ©2016 [employer] ° °

## 2015-08-14 NOTE — Sedation Documentation (Signed)
Patient denies pain and is resting comfortably.  

## 2015-08-14 NOTE — H&P (Signed)
Chief Complaint: Patient was seen in consultation today for cerebral arteriogram at the request of Dr Audria Nine  Referring Physician(s): Dr Audria Nine  Supervising Physician: Luanne Bras  Patient Status: Out-pt  History of Present Illness: Beth Espinoza is a 80 y.o. female   Suffered TIA symptom 07/19/2015 Sudden headache and tingling in Left face; hand; foot Lasted few seconds and went away Similar symptoms 1 mo prior----did not seek Medical attention  CTA 07/20/15:  IMPRESSION: 1. High-grade stenosis at the right ICA bulb (numerically estimated at 70%). Proximal left ICA and bilateral carotid siphon atherosclerosis without significant stenosis. 2. Mild PCA irregularity without significant stenosis. Dominant left vertebral artery with left V4 segment soft and calcified plaque but no significant stenosis. Soft plaque in the aortic arch, proximal great vessels, and at both vertebral artery origins without significant stenosis. 3. 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 Recommend annual imaging followup by CTA or MRA. This recommendation follows 2010 ACCF/AHA/AATS/ACR/ASA/SCA/SCAI/SIR/STS/SVM Guidelines for the Diagnosis and Management of Patients With Thoracic Aortic Disease. Circulation. 2010; 121: [id number]. 4. Stable CT appearance of the brain since 07/19/2015. Mild chronic small vessel disease  Remains asymptomatic mostly Plavix daily Does say she has some tinitus bilaterally occasionally and Left neck tightness Referred to Dr Estanislado Pandy for evaluation and possible treatment if needed Fam Hx cerebral aneurysms Scheduled now for cerebral arteriogram  Past Medical History  Diagnosis Date  . History of kidney stones October 2013  . Arthritis   . Hyperlipidemia   . Hx: UTI (urinary tract infection)   . History of chicken pox     Past Surgical History  Procedure Laterality Date  . Breast biopsy Right 1995  . Tonsillectomy and adenoidectomy   1945  . Partial hysterectomy  1972    Allergies: Pneumovax  Medications: Prior to Admission medications   Medication Sig Start Date End Date Taking? Authorizing Provider  ALPRAZolam (XANAX) 0.5 MG tablet TAKE 1 TABLET AT BEDTIME AS NEEDED FOR ANXIETY. 08/14/15  Yes [name], MD  amLODipine (NORVASC) 5 MG tablet Take 1 tablet (5 mg total) by mouth daily. 07/23/15  Yes Leone Haven, MD  atenolol (TENORMIN) 25 MG tablet Take 1 tablet (25 mg total) by mouth daily. 06/13/15  Yes [name], MD  clopidogrel (PLAVIX) 75 MG tablet Take 1 tablet (75 mg total) by mouth daily. 07/20/15  Yes Albertine Patricia, MD  folic acid (FOLVITE) Q000111Q MCG tablet Take 800 mcg by mouth daily.   Yes Historical Provider, MD  methotrexate (RHEUMATREX) 2.5 MG tablet Take 6 tablets per week. Patient taking differently: Take 8 tablets per week. 04/09/14  Yes [name], MD  PARoxetine (PAXIL) 10 MG tablet Take 1 tablet (10 mg total) by mouth every morning. 06/13/15  Yes [name], MD  vitamin C (ASCORBIC ACID) 500 MG tablet Take 500 mg by mouth daily.    Yes Historical Provider, MD     Family History  Problem Relation Age of Onset  . Heart disease Father     Social History   Social History  . Marital Status: Married    Spouse Name: N/A  . Number of Children: N/A  . Years of Education: N/A   Social History Main Topics  . Smoking status: Never Smoker   . Smokeless tobacco: Never Used  . Alcohol Use: No  . Drug Use: No  . Sexual Activity: Not Asked   Other Topics Concern  . None   Social History Narrative  Review of Systems: A 12 point ROS discussed and pertinent positives are indicated in the HPI above.  All other systems are negative.  Review of Systems  Constitutional: Negative for fever, activity change and fatigue.  HENT: Positive for tinnitus. Negative for hearing loss.   Eyes: Negative for visual disturbance.  Respiratory: Negative for cough and shortness of breath.     Gastrointestinal: Negative for abdominal pain.  Neurological: Negative for dizziness, tremors, seizures, syncope, facial asymmetry, speech difficulty, weakness, light-headedness, numbness and headaches.  Psychiatric/Behavioral: Negative for behavioral problems and confusion.    Vital Signs: BP 163/90 mmHg  Pulse 46  Temp(Src) 97.5 F (36.4 C) (Oral)  Resp 20  Ht 5\' 7"  (1.702 m)  Wt 148 lb (67.132 kg)  BMI 23.17 kg/m2  SpO2 100%  Physical Exam  Constitutional: She is oriented to person, place, and time.  HENT:  Head: Atraumatic.  Eyes: EOM are normal.  Neck: Neck supple.  Cardiovascular: Normal rate, regular rhythm and normal heart sounds.   Pulmonary/Chest: Effort normal and breath sounds normal.  Abdominal: Soft. Bowel sounds are normal. There is no tenderness.  Musculoskeletal: Normal range of motion.  Neurological: She is alert and oriented to person, place, and time.  Skin: Skin is warm and dry.  Psychiatric: She has a normal mood and affect. Her behavior is normal. Judgment and thought content normal.  Nursing note and vitals reviewed.   Mallampati Score:  MD Evaluation Airway: WNL Heart: WNL Abdomen: WNL Chest/ Lungs: WNL ASA  Classification: 2 Mallampati/Airway Score: One  Imaging: Ct Angio Head W/cm &/or Wo Cm  07/20/2015  CLINICAL DATA:  80 year old female with sudden onset headache. MRI in negative for acute stroke. Initial encounter. EXAM: CT ANGIOGRAPHY HEAD AND NECK TECHNIQUE: Multidetector CT imaging of the head and neck was performed using the standard protocol during bolus administration of intravenous contrast. Multiplanar CT image reconstructions and MIPs were obtained to evaluate the vascular anatomy. Carotid stenosis measurements (when applicable) are obtained utilizing NASCET criteria, using the distal internal carotid diameter as the denominator. CONTRAST:  50 mL Isovue 370 COMPARISON:  Brain MRI with head and neck MRA 0021 hours today. Noncontrast  head CT 07/19/2015, and earlier FINDINGS: CTA NECK Skeleton: No acute osseous abnormality identified. Mild for age degenerative changes in the cervical spine. Mild scoliosis. Visualized paranasal sinuses and mastoids are clear. Other neck: Negative lung apices. No superior mediastinal lymphadenopathy. Sub cm heterogeneously enhancing and coarsely calcified thyroid nodules which do not meet consensus criteria for ultrasound follow-up. Asymmetric sclerosis of the right, but no laryngeal mass (series 401, image 57). Negative pharyngeal soft tissue contours. Negative parapharyngeal spaces, retropharyngeal space, sublingual space, submandibular glands, and parotid glands. Bilateral cervical lymph nodes are within normal limits. Aortic arch: Bovine type arch configuration. Widespread circumferential soft plaque in the arch and proximal great vessels. Lesser associated calcified plaque. The distal arch demonstrates fusiform dilatation to at least 37 mm diameter (series 401, image 3). Right carotid system: No brachiocephalic artery or right CCA origin stenosis despite circumferential soft plaque. Soft plaque in the right CCA without stenosis proximal to the bifurcation. At the bifurcation soft and calcified plaque affects the right ICA origin and bulb. In the distal bulb confluent calcified plaque results in a short segment of high-grade stenosis numerically estimated at 70 % with respect to the distal vessel (series 403, image 136 and series 406, image 91). Distal to the bulb the cervical right ICA is negative. Left carotid system: No left CCA stenosis proximal  to the bifurcation. At the bifurcation soft plaque mostly affects the ECA origin while soft and calcified plaque affects the left ICA origin and bulb, but without hemodynamically significant stenosis (less than 50 % with respect to the distal vessel). Negative cervical left ICA otherwise. Vertebral arteries: No proximal right subclavian artery stenosis despite soft  plaque. Soft plaque at the right vertebral artery origin but no significant stenosis (series 406, image 112). The right vertebral artery is normal to the skullbase. No hemodynamically significant proximal left subclavian artery stenosis despite soft and calcified plaque. Soft plaque at the left vertebral artery origin but no significant stenosis (series 406, image 116). The left vertebral artery is mildly dominant and normal to the skullbase. CTA HEAD Posterior circulation: Soft and calcified plaque in the distal left vertebral artery, V4 segment, resulting an mild stenosis. Mild irregularity also in the right vertebral artery V4 segment. Normal PICA origins and vertebrobasilar junction. No basilar artery stenosis. Normal SCA and PCA origins. Both posterior communicating arteries are present. Bilateral PCA P3 branches are mildly irregular but otherwise normal. Anterior circulation: Both ICA siphons are patent. Mild to moderate calcified plaque in the cavernous segments without significant stenosis. Ophthalmic and posterior communicating artery origins are normal. Patent carotid termini. Normal MCA and ACA origins. Anterior communicating artery and bilateral ACA branches are within normal limits. Left MCA M1 segment, bifurcation, and left MCA branches are within normal limits. Right MCA M1 segment, bifurcation, and right MCA branches are within normal limits. Venous sinuses: Patent. Anatomic variants: Dominant left vertebral artery. Bovine type aortic arch configuration. Delayed phase: No abnormal enhancement identified. IMPRESSION: 1. High-grade stenosis at the right ICA bulb (numerically estimated at 70%). Proximal left ICA and bilateral carotid siphon atherosclerosis without significant stenosis. 2. Mild PCA irregularity without significant stenosis. Dominant left vertebral artery with left V4 segment soft and calcified plaque but no significant stenosis. Soft plaque in the aortic arch, proximal great vessels, and  at both vertebral artery origins without significant stenosis. 3. 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 Recommend annual imaging followup by CTA or MRA. This recommendation follows 2010 ACCF/AHA/AATS/ACR/ASA/SCA/SCAI/SIR/STS/SVM Guidelines for the Diagnosis and Management of Patients With Thoracic Aortic Disease. Circulation. 2010; 121: [id number]. 4. Stable CT appearance of the brain since 07/19/2015. Mild chronic small vessel disease Electronically Signed   By: Genevie Ann M.D.   On: 07/20/2015 11:39   Ct Head Wo Contrast  07/19/2015  CLINICAL DATA:  Headache. EXAM: CT HEAD WITHOUT CONTRAST TECHNIQUE: Contiguous axial images were obtained from the base of the skull through the vertex without intravenous contrast. COMPARISON:  10/30/1999 FINDINGS: Brain: No evidence of acute infarction, hemorrhage, extra-axial collection, ventriculomegaly, or mass effect. Vascular: No hyperdense vessel. Vascular calcifications at the skullbase. Skull: Negative for fracture or focal lesion. Sinuses/Orbits: No acute findings. Other: None. IMPRESSION: No acute intracranial abnormality. Electronically Signed   By: Fidela Salisbury M.D.   On: 07/19/2015 22:56   Ct Angio Neck W/cm &/or Wo/cm  07/20/2015  CLINICAL DATA:  80 year old female with sudden onset headache. MRI in negative for acute stroke. Initial encounter. EXAM: CT ANGIOGRAPHY HEAD AND NECK TECHNIQUE: Multidetector CT imaging of the head and neck was performed using the standard protocol during bolus administration of intravenous contrast. Multiplanar CT image reconstructions and MIPs were obtained to evaluate the vascular anatomy. Carotid stenosis measurements (when applicable) are obtained utilizing NASCET criteria, using the distal internal carotid diameter as the denominator. CONTRAST:  50 mL Isovue 370 COMPARISON:  Brain MRI with head  and neck MRA 0021 hours today. Noncontrast head CT 07/19/2015, and earlier FINDINGS: CTA NECK Skeleton: No acute osseous  abnormality identified. Mild for age degenerative changes in the cervical spine. Mild scoliosis. Visualized paranasal sinuses and mastoids are clear. Other neck: Negative lung apices. No superior mediastinal lymphadenopathy. Sub cm heterogeneously enhancing and coarsely calcified thyroid nodules which do not meet consensus criteria for ultrasound follow-up. Asymmetric sclerosis of the right, but no laryngeal mass (series 401, image 57). Negative pharyngeal soft tissue contours. Negative parapharyngeal spaces, retropharyngeal space, sublingual space, submandibular glands, and parotid glands. Bilateral cervical lymph nodes are within normal limits. Aortic arch: Bovine type arch configuration. Widespread circumferential soft plaque in the arch and proximal great vessels. Lesser associated calcified plaque. The distal arch demonstrates fusiform dilatation to at least 37 mm diameter (series 401, image 3). Right carotid system: No brachiocephalic artery or right CCA origin stenosis despite circumferential soft plaque. Soft plaque in the right CCA without stenosis proximal to the bifurcation. At the bifurcation soft and calcified plaque affects the right ICA origin and bulb. In the distal bulb confluent calcified plaque results in a short segment of high-grade stenosis numerically estimated at 70 % with respect to the distal vessel (series 403, image 136 and series 406, image 91). Distal to the bulb the cervical right ICA is negative. Left carotid system: No left CCA stenosis proximal to the bifurcation. At the bifurcation soft plaque mostly affects the ECA origin while soft and calcified plaque affects the left ICA origin and bulb, but without hemodynamically significant stenosis (less than 50 % with respect to the distal vessel). Negative cervical left ICA otherwise. Vertebral arteries: No proximal right subclavian artery stenosis despite soft plaque. Soft plaque at the right vertebral artery origin but no significant  stenosis (series 406, image 112). The right vertebral artery is normal to the skullbase. No hemodynamically significant proximal left subclavian artery stenosis despite soft and calcified plaque. Soft plaque at the left vertebral artery origin but no significant stenosis (series 406, image 116). The left vertebral artery is mildly dominant and normal to the skullbase. CTA HEAD Posterior circulation: Soft and calcified plaque in the distal left vertebral artery, V4 segment, resulting an mild stenosis. Mild irregularity also in the right vertebral artery V4 segment. Normal PICA origins and vertebrobasilar junction. No basilar artery stenosis. Normal SCA and PCA origins. Both posterior communicating arteries are present. Bilateral PCA P3 branches are mildly irregular but otherwise normal. Anterior circulation: Both ICA siphons are patent. Mild to moderate calcified plaque in the cavernous segments without significant stenosis. Ophthalmic and posterior communicating artery origins are normal. Patent carotid termini. Normal MCA and ACA origins. Anterior communicating artery and bilateral ACA branches are within normal limits. Left MCA M1 segment, bifurcation, and left MCA branches are within normal limits. Right MCA M1 segment, bifurcation, and right MCA branches are within normal limits. Venous sinuses: Patent. Anatomic variants: Dominant left vertebral artery. Bovine type aortic arch configuration. Delayed phase: No abnormal enhancement identified. IMPRESSION: 1. High-grade stenosis at the right ICA bulb (numerically estimated at 70%). Proximal left ICA and bilateral carotid siphon atherosclerosis without significant stenosis. 2. Mild PCA irregularity without significant stenosis. Dominant left vertebral artery with left V4 segment soft and calcified plaque but no significant stenosis. Soft plaque in the aortic arch, proximal great vessels, and at both vertebral artery origins without significant stenosis. 3. Ectatic  to mildly aneurysmal thoracic aortic arch. Recommend annual imaging followup by CTA or MRA. This recommendation follows 2010 ACCF/AHA/AATS/ACR/ASA/SCA/SCAI/SIR/STS/SVM Guidelines  for the Diagnosis and Management of Patients With Thoracic Aortic Disease. Circulation. 2010; 121: [id number]. 4. Stable CT appearance of the brain since 07/19/2015. Mild chronic small vessel disease Electronically Signed   By: Genevie Ann M.D.   On: 07/20/2015 11:39   Mr Beth Espinoza Head Wo Contrast  07/20/2015  CLINICAL DATA:  Initial evaluation for acute headache. EXAM: MRI HEAD WITHOUT AND WITH CONTRAST MRA HEAD WITHOUT CONTRAST MRA NECK WITHOUT AND WITH CONTRAST TECHNIQUE: Multiplanar, multiecho pulse sequences of the brain and surrounding structures were obtained without and with intravenous contrast. Angiographic images of the Circle of Willis were obtained using MRA technique without intravenous contrast. Angiographic images of the neck were obtained using MRA technique without and with intravenous contrast. Carotid stenosis measurements (when applicable) are obtained utilizing NASCET criteria, using the distal internal carotid diameter as the denominator. CONTRAST:  33mL MULTIHANCE GADOBENATE DIMEGLUMINE 529 MG/ML IV SOLN COMPARISON:  Prior CT from 07/19/2015. FINDINGS: MRI HEAD FINDINGS Mild diffuse prominence of the CSF containing spaces compatible with generalized age-related cerebral atrophy. Mild confluent T2 hyperintensity within the periventricular white matter like related to minimal chronic small vessel ischemic changes, felt to be within normal limits for patient age. Few small remote lacunar infarcts present within the left caudate. Additional remote lacunar infarct within the right thalamus. Possible additional remote lacunar infarct within the right lentiform nucleus. Additional cystic lucencies within the basal ganglia likely reflect dilated perivascular spaces. No evidence for remote cortical or cerebellar infarct. No abnormal  foci of restricted diffusion to suggest acute intracranial infarct. Gray-white matter differentiation maintained. Major intracranial vascular flow voids are preserved. No acute or chronic intracranial hemorrhage. No mass lesion, midline shift, or mass effect. No hydrocephalus. No extra-axial fluid collection. Major dural sinuses are grossly patent. No abnormal enhancement. Craniocervical junction within normal limits. Visualized upper cervical spine unremarkable. Pituitary gland within normal limits. No acute abnormality about the orbits. Paranasal sinuses are largely clear. No mastoid effusion. Inner ear structures grossly normal. Bone marrow signal intensity within normal limits. No scalp soft tissue abnormality. MRA HEAD FINDINGS ANTERIOR CIRCULATION: Study is degraded by motion artifact, or opacification of the vessels on 3D reconstructions. Visualized distal cervical segments of the internal carotid arteries are patent with antegrade flow. Petrous, cavernous, and supraclinoid segments widely patent without flow limiting stenosis. A1 segments patent bilaterally. Anterior communicating artery normal. Anterior cerebral arteries well opacified to their distal aspects. M1 segments widely patent proximally without stenosis or occlusion. Distal M1 segments not well evaluated on this exam, but are grossly patent. MCA bifurcations not well evaluated. Distal MCA branches fairly symmetric and well opacified, although are not well evaluated proximally due to artifact on this exam POSTERIOR CIRCULATION: Left vertebral artery dominant and widely patent to the vertebrobasilar junction. Diminutive right vertebral artery grossly patent as well, although it was not attenuated distally. This is likely artifactual on this exam, as this appears patent on corresponding MRA of the neck. Basilar artery widely patent to its distal aspect. Superior cerebral arteries patent bilaterally. Both of the posterior cerebral arteries arise from  the basilar artery and are well opacified to their distal aspects. Small left posterior communicating artery marrow multifocal moderate to severe stenoses noted within the P2 segments bilaterally, left worse than right. Small left posterior communicating artery noted. No aneurysm or vascular malformation. MRA NECK FINDINGS Visualized aortic arch of normal caliber with normal branch pattern. No high-grade stenosis at the origin of the great vessels. Right common carotid artery patent from its origin  to the bifurcation. Mild atheromatous irregularity about the right bifurcation/proximal right ICA without significant stenosis. Right ICA well opacified distally to the circle of Willis without flow-limiting stenosis, dissection, or vascular occlusion. Left common carotid artery patent from its origin to the bifurcation. Atheromatous irregularity about the left bifurcation/proximal left ICA without definite flow limiting stenosis. Left ICA patent from the bifurcation to the skullbase without stenosis, dissection, or occlusion. Note made of moderate narrowing of the proximal left external carotid artery. Both vertebral arteries arise from the subclavian arteries. Left vertebral artery is dominant. There is question of mild to moderate narrowing at the origin of the left vertebral artery (series 9, image 20). Vertebral arteries otherwise patent within the neck without stenosis, dissection, or occlusion. IMPRESSION: MRI HEAD IMPRESSION: 1. No acute intracranial process identified. 2. Small remote lacunar infarcts involving the bilateral basal ganglia and right thalamus as above. 3. Mild age-related cerebral atrophy. MRA HEAD IMPRESSION: 1. Motion degraded study. No large or proximal arterial branch occlusion identified. No correctable stenosis. 2. 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 Please note that these stenoses may in part be related to motion artifact on this exam. No other  definite focal high-grade stenosis identified on this limited exam. MRA NECK IMPRESSION: 1. No high-grade or critical stenosis identified within the major arterial vasculature of the neck. 2. Mild atheromatous irregularity about the carotid bifurcations/proximal ICAs bilaterally without high-grade flow-limiting stenosis, slightly worse on the left. 3. Question mild to moderate stenosis at the origin of the dominant left vertebral artery. Vertebral arteries otherwise widely patent within the neck. 4. Incidental note made of moderate stenosis of the proximal left external carotid artery. Electronically Signed   By: Jeannine Boga M.D.   On: 07/20/2015 02:26   Mr Angiogram Neck W Wo Contrast  07/20/2015  CLINICAL DATA:  Initial evaluation for acute headache. EXAM: MRI HEAD WITHOUT AND WITH CONTRAST MRA HEAD WITHOUT CONTRAST MRA NECK WITHOUT AND WITH CONTRAST TECHNIQUE: Multiplanar, multiecho pulse sequences of the brain and surrounding structures were obtained without and with intravenous contrast. Angiographic images of the Circle of Willis were obtained using MRA technique without intravenous contrast. Angiographic images of the neck were obtained using MRA technique without and with intravenous contrast. Carotid stenosis measurements (when applicable) are obtained utilizing NASCET criteria, using the distal internal carotid diameter as the denominator. CONTRAST:  25mL MULTIHANCE GADOBENATE DIMEGLUMINE 529 MG/ML IV SOLN COMPARISON:  Prior CT from 07/19/2015. FINDINGS: MRI HEAD FINDINGS Mild diffuse prominence of the CSF containing spaces compatible with generalized age-related cerebral atrophy. Mild confluent T2 hyperintensity within the periventricular white matter like related to minimal chronic small vessel ischemic changes, felt to be within normal limits for patient age. Few small remote lacunar infarcts present within the left caudate. Additional remote lacunar infarct within the right thalamus.  Possible additional remote lacunar infarct within the right lentiform nucleus. Additional cystic lucencies within the basal ganglia likely reflect dilated perivascular spaces. No evidence for remote cortical or cerebellar infarct. No abnormal foci of restricted diffusion to suggest acute intracranial infarct. Gray-white matter differentiation maintained. Major intracranial vascular flow voids are preserved. No acute or chronic intracranial hemorrhage. No mass lesion, midline shift, or mass effect. No hydrocephalus. No extra-axial fluid collection. Major dural sinuses are grossly patent. No abnormal enhancement. Craniocervical junction within normal limits. Visualized upper cervical spine unremarkable. Pituitary gland within normal limits. No acute abnormality about the orbits. Paranasal sinuses are largely clear. No mastoid effusion. Inner ear structures grossly  normal. Bone marrow signal intensity within normal limits. No scalp soft tissue abnormality. MRA HEAD FINDINGS ANTERIOR CIRCULATION: Study is degraded by motion artifact, or opacification of the vessels on 3D reconstructions. Visualized distal cervical segments of the internal carotid arteries are patent with antegrade flow. Petrous, cavernous, and supraclinoid segments widely patent without flow limiting stenosis. A1 segments patent bilaterally. Anterior communicating artery normal. Anterior cerebral arteries well opacified to their distal aspects. M1 segments widely patent proximally without stenosis or occlusion. Distal M1 segments not well evaluated on this exam, but are grossly patent. MCA bifurcations not well evaluated. Distal MCA branches fairly symmetric and well opacified, although are not well evaluated proximally due to artifact on this exam POSTERIOR CIRCULATION: Left vertebral artery dominant and widely patent to the vertebrobasilar junction. Diminutive right vertebral artery grossly patent as well, although it was not attenuated distally. This  is likely artifactual on this exam, as this appears patent on corresponding MRA of the neck. Basilar artery widely patent to its distal aspect. Superior cerebral arteries patent bilaterally. Both of the posterior cerebral arteries arise from the basilar artery and are well opacified to their distal aspects. Small left posterior communicating artery marrow multifocal moderate to severe stenoses noted within the P2 segments bilaterally, left worse than right. Small left posterior communicating artery noted. No aneurysm or vascular malformation. MRA NECK FINDINGS Visualized aortic arch of normal caliber with normal branch pattern. No high-grade stenosis at the origin of the great vessels. Right common carotid artery patent from its origin to the bifurcation. Mild atheromatous irregularity about the right bifurcation/proximal right ICA without significant stenosis. Right ICA well opacified distally to the circle of Willis without flow-limiting stenosis, dissection, or vascular occlusion. Left common carotid artery patent from its origin to the bifurcation. Atheromatous irregularity about the left bifurcation/proximal left ICA without definite flow limiting stenosis. Left ICA patent from the bifurcation to the skullbase without stenosis, dissection, or occlusion. Note made of moderate narrowing of the proximal left external carotid artery. Both vertebral arteries arise from the subclavian arteries. Left vertebral artery is dominant. There is question of mild to moderate narrowing at the origin of the left vertebral artery (series 9, image 20). Vertebral arteries otherwise patent within the neck without stenosis, dissection, or occlusion. IMPRESSION: MRI HEAD IMPRESSION: 1. No acute intracranial process identified. 2. Small remote lacunar infarcts involving the bilateral basal ganglia and right thalamus as above. 3. Mild age-related cerebral atrophy. MRA HEAD IMPRESSION: 1. Motion degraded study. No large or proximal  arterial branch occlusion identified. No correctable stenosis. 2. 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 Please note that these stenoses may in part be related to motion artifact on this exam. No other definite focal high-grade stenosis identified on this limited exam. MRA NECK IMPRESSION: 1. No high-grade or critical stenosis identified within the major arterial vasculature of the neck. 2. Mild atheromatous irregularity about the carotid bifurcations/proximal ICAs bilaterally without high-grade flow-limiting stenosis, slightly worse on the left. 3. Question mild to moderate stenosis at the origin of the dominant left vertebral artery. Vertebral arteries otherwise widely patent within the neck. 4. Incidental note made of moderate stenosis of the proximal left external carotid artery. Electronically Signed   By: Jeannine Boga M.D.   On: 07/20/2015 02:26   Mr Jeri Cos [id number] Contrast  07/20/2015  CLINICAL DATA:  Initial evaluation for acute headache. EXAM: MRI HEAD WITHOUT AND WITH CONTRAST MRA HEAD WITHOUT CONTRAST MRA NECK WITHOUT AND WITH CONTRAST TECHNIQUE:  Multiplanar, multiecho pulse sequences of the brain and surrounding structures were obtained without and with intravenous contrast. Angiographic images of the Circle of Willis were obtained using MRA technique without intravenous contrast. Angiographic images of the neck were obtained using MRA technique without and with intravenous contrast. Carotid stenosis measurements (when applicable) are obtained utilizing NASCET criteria, using the distal internal carotid diameter as the denominator. CONTRAST:  33mL MULTIHANCE GADOBENATE DIMEGLUMINE 529 MG/ML IV SOLN COMPARISON:  Prior CT from 07/19/2015. FINDINGS: MRI HEAD FINDINGS Mild diffuse prominence of the CSF containing spaces compatible with generalized age-related cerebral atrophy. Mild confluent T2 hyperintensity within the periventricular white matter like  related to minimal chronic small vessel ischemic changes, felt to be within normal limits for patient age. Few small remote lacunar infarcts present within the left caudate. Additional remote lacunar infarct within the right thalamus. Possible additional remote lacunar infarct within the right lentiform nucleus. Additional cystic lucencies within the basal ganglia likely reflect dilated perivascular spaces. No evidence for remote cortical or cerebellar infarct. No abnormal foci of restricted diffusion to suggest acute intracranial infarct. Gray-white matter differentiation maintained. Major intracranial vascular flow voids are preserved. No acute or chronic intracranial hemorrhage. No mass lesion, midline shift, or mass effect. No hydrocephalus. No extra-axial fluid collection. Major dural sinuses are grossly patent. No abnormal enhancement. Craniocervical junction within normal limits. Visualized upper cervical spine unremarkable. Pituitary gland within normal limits. No acute abnormality about the orbits. Paranasal sinuses are largely clear. No mastoid effusion. Inner ear structures grossly normal. Bone marrow signal intensity within normal limits. No scalp soft tissue abnormality. MRA HEAD FINDINGS ANTERIOR CIRCULATION: Study is degraded by motion artifact, or opacification of the vessels on 3D reconstructions. Visualized distal cervical segments of the internal carotid arteries are patent with antegrade flow. Petrous, cavernous, and supraclinoid segments widely patent without flow limiting stenosis. A1 segments patent bilaterally. Anterior communicating artery normal. Anterior cerebral arteries well opacified to their distal aspects. M1 segments widely patent proximally without stenosis or occlusion. Distal M1 segments not well evaluated on this exam, but are grossly patent. MCA bifurcations not well evaluated. Distal MCA branches fairly symmetric and well opacified, although are not well evaluated proximally  due to artifact on this exam POSTERIOR CIRCULATION: Left vertebral artery dominant and widely patent to the vertebrobasilar junction. Diminutive right vertebral artery grossly patent as well, although it was not attenuated distally. This is likely artifactual on this exam, as this appears patent on corresponding MRA of the neck. Basilar artery widely patent to its distal aspect. Superior cerebral arteries patent bilaterally. Both of the posterior cerebral arteries arise from the basilar artery and are well opacified to their distal aspects. Small left posterior communicating artery marrow multifocal moderate to severe stenoses noted within the P2 segments bilaterally, left worse than right. Small left posterior communicating artery noted. No aneurysm or vascular malformation. MRA NECK FINDINGS Visualized aortic arch of normal caliber with normal branch pattern. No high-grade stenosis at the origin of the great vessels. Right common carotid artery patent from its origin to the bifurcation. Mild atheromatous irregularity about the right bifurcation/proximal right ICA without significant stenosis. Right ICA well opacified distally to the circle of Willis without flow-limiting stenosis, dissection, or vascular occlusion. Left common carotid artery patent from its origin to the bifurcation. Atheromatous irregularity about the left bifurcation/proximal left ICA without definite flow limiting stenosis. Left ICA patent from the bifurcation to the skullbase without stenosis, dissection, or occlusion. Note made of moderate narrowing of the proximal left  external carotid artery. Both vertebral arteries arise from the subclavian arteries. Left vertebral artery is dominant. There is question of mild to moderate narrowing at the origin of the left vertebral artery (series 9, image 20). Vertebral arteries otherwise patent within the neck without stenosis, dissection, or occlusion. IMPRESSION: MRI HEAD IMPRESSION: 1. No acute  intracranial process identified. 2. Small remote lacunar infarcts involving the bilateral basal ganglia and right thalamus as above. 3. Mild age-related cerebral atrophy. MRA HEAD IMPRESSION: 1. Motion degraded study. No large or proximal arterial branch occlusion identified. No correctable stenosis. 2. 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 Please note that these stenoses may in part be related to motion artifact on this exam. No other definite focal high-grade stenosis identified on this limited exam. MRA NECK IMPRESSION: 1. No high-grade or critical stenosis identified within the major arterial vasculature of the neck. 2. Mild atheromatous irregularity about the carotid bifurcations/proximal ICAs bilaterally without high-grade flow-limiting stenosis, slightly worse on the left. 3. Question mild to moderate stenosis at the origin of the dominant left vertebral artery. Vertebral arteries otherwise widely patent within the neck. 4. Incidental note made of moderate stenosis of the proximal left external carotid artery. Electronically Signed   By: Jeannine Boga M.D.   On: 07/20/2015 02:26   [facility] Eval [facility] Mgmt  07/25/2015  EXAM: NEW PATIENT OFFICE VISIT CHIEF COMPLAINT: Transient ischemic attack. Current Pain Level: 1-10 HISTORY OF PRESENT ILLNESS: Patient is a 81 year old, right-handed lady who has been referred for evaluation of a newly discovered right carotid artery severe stenoses. The patient is accompanied by her daughter who is a Marine scientist. The patient apparently developed acute onset of left-sided paresthesias involving tingling of the left-side of her face, hand and foot while at a restaurant a few days ago. This was preceded by a sensation of a pop in her head. The patient was then driven home by her friend. While there, the patient experienced what she describes as a vague brushing sensation on both sides of the neck. This has been intermittent,  usually associated with recently discovered high blood pressure in the [id number] and A999333 diastolic range. She also complains of what she says is a frontal, icky headache which may become throbbing in nature without associated nausea, vomiting, photophobia or sensitivities to loud noises. There has been no associated loss of consciousness or of altered sensorium. No visual symptoms, motor weakness, chest pains, palpitations, shortness of breath, wheezing or coughing of blood. He denies recent chills, fever or rigors. Recently she was discovered to have significantly high blood pressure, the readings of which were in the 230s/110 and also yesterday her reading was A999333 diastolic. She was switched to amlodipine yesterday from atenolol on which she had been on for many years. The patient reports to have had similar episodes in the past. She has always suspected that she has had high blood pressure though has never seeked medical advice. Workup at the time of her admission, no acute ischemic change on the MRI scan of the brain. However a CT angiogram to suggest possibly of 80% stenosis of the right internal carotid artery. Past Medical History: History of kidney stones. Arthritis. Hyperlipidemia. UTI. Chicken pox. Past Surgical history: Breast biopsy, tonsillectomy and adenoidectomy. Partial hysterectomy. Medications: Alprazolam help her sleep. Amlodipine 5 mg a day, started recently. Atenolol stopped two days ago. Plavix 75 mg a day. Folic acid. Methotrexate for polymyalgia rheumatica. Paxil for depression. Vitamin-C tablets. Allergies: Pneumovax. Social History: Patient  is married. Has four children alive and well. Denies smoking or drinking alcohol. Drinks up to 2 cups of coffee a day. Denies use of illicit chemicals. Family History: Positive for father having died of heart disease at the age of 74. REVIEW OF SYSTEMS: Essentially negative for pathologic symptomatology unless as mentioned above. PHYSICAL EXAMINATION:  Affect normal. In no acute distress. Neurologically intact grossly. ASSESSMENT AND PLAN: The patient's recent Neuro imaging findings in association with her TIA symptoms were reviewed in detail and with such, given the patient's chronic hypertension, and her CT angiographic findings, further evaluation with a formal catheter angiogram to accurately evaluate the degree of stenosis of the extracranial right carotid artery would be useful in order to prevent further permanent neurological deficit. The procedure, the risks, the benefits and the alternatives were all reviewed. Questions were answered to their satisfaction. The patient and daughter are in agreement to proceed with a diagnostic catheter angiogram. This will be scheduled in the next week or two. In the meantime the patient has been advised to monitor her blood pressure closely at home and communicate with her referring primary physician in regards to medication adjustment. They both leave with good understanding and agreement with above management plan. Electronically Signed   By: Luanne Bras M.D.   On: 07/24/2015 15:36    Labs:  CBC:  Recent Labs  10/17/14 0954 07/19/15 2225 07/20/15 1021 08/14/15 0845  WBC 7.7 8.3 8.1 7.0  HGB 13.1 13.9 13.5 13.9  HCT 40.1 41.9 42.1 42.1  PLT 369.0 272 277 323    COAGS:  Recent Labs  08/14/15 0845  INR 1.10  APTT 30    BMP:  Recent Labs  10/17/14 0954 07/19/15 2225 07/20/15 1021 07/23/15 1200 08/14/15 0845  NA 139 141  --  138 139  K 4.7 3.7  --  4.2 3.8  CL 104 105  --  104 108  CO2 29 28  --  24 25  GLUCOSE 98 116*  --  93 101*  BUN 15 22*  --  16 19  CALCIUM 9.8 9.6  --  9.9 9.8  CREATININE 0.79 0.84 0.85 0.74 0.83  GFRNONAA  --  >60 >60  --  >60  GFRAA  --  >60 >60  --  >60    LIVER FUNCTION TESTS:  Recent Labs  10/17/14 0954 07/23/15 1200  BILITOT 0.6 0.5  AST 22 36  ALT 16 26  ALKPHOS 58 66  PROT 7.0 6.8  ALBUMIN 3.9 4.5    TUMOR MARKERS: No  results for input(s): AFPTM, CEA, CA199, CHROMGRNA in the last 8760 hours.  Assessment and Plan:  TIA 07/19/2015 CTA reveals R Carotid artery stenosis Scheduled for cerebral arteriogram for evaluation Risks and Benefits discussed with the patient including, but not limited to bleeding, infection, vascular injury, contrast induced renal failure, stroke or even death. All of the patient's questions were answered, patient is agreeable to proceed. Consent signed and in chart.  Thank you for this interesting consult.  I greatly enjoyed meeting Cellia Reik and look forward to participating in their care.  A copy of this report was sent to the requesting provider on this date.  Electronically Signed: Idali Lafever A 08/14/2015, 9:52 AM   I spent a total of  30 Minutes   in face to face in clinical consultation, greater than 50% of which was counseling/coordinating care for cerebral arteriogram

## 2015-08-14 NOTE — Procedures (Signed)
S/P 4 vessel cerebral arteriogram Rt CFA approach. Findings. 1.Approx 45 % Stenosis RT ICA prox. 2.Approx 50 % stenosis Lt ECA prox. 3.Approx 50 to 75 stenosis of prox Lt ACA A1 seg

## 2015-08-14 NOTE — Sedation Documentation (Signed)
Right groin clean, dry, and intact, no hematoma noted

## 2015-08-14 NOTE — Sedation Documentation (Signed)
Co2 tubing removed per Dr. Estanislado Pandy request, unable to monitor Co2 at this time.

## 2015-08-14 NOTE — Telephone Encounter (Signed)
ok'd refill xanax #30 with one refill.   

## 2015-08-15 NOTE — Telephone Encounter (Signed)
"  Phone in" was selected. Rx not printed 

## 2015-08-16 ENCOUNTER — Ambulatory Visit: Payer: Medicare Other | Admitting: Internal Medicine

## 2015-08-20 ENCOUNTER — Other Ambulatory Visit: Payer: Self-pay

## 2015-08-20 NOTE — Telephone Encounter (Signed)
Covering for Dr. Nicki Reaper. I actually most recently saw the patient in the office. Please ensure that she's been taking the Plavix and then I will refill.

## 2015-08-20 NOTE — Telephone Encounter (Signed)
This was not prescribed by you, please advise? thanks

## 2015-08-20 NOTE — Telephone Encounter (Signed)
Left message for patient to return call.

## 2015-08-21 ENCOUNTER — Other Ambulatory Visit: Payer: Self-pay | Admitting: Internal Medicine

## 2015-08-22 MED ORDER — CLOPIDOGREL BISULFATE 75 MG PO TABS: 75.0000 mg | ORAL_TABLET | Freq: Every day | ORAL | Status: DC

## 2015-08-22 NOTE — Telephone Encounter (Signed)
Will go ahead and send in refill and ask that CMA try to contact patient again.

## 2015-08-22 NOTE — Telephone Encounter (Signed)
Spoke with patient and she has been taking this medication since she was released from the hospital.

## 2015-08-27 ENCOUNTER — Ambulatory Visit (INDEPENDENT_AMBULATORY_CARE_PROVIDER_SITE_OTHER): Payer: Medicare Other | Admitting: Internal Medicine

## 2015-08-27 ENCOUNTER — Encounter: Payer: Self-pay | Admitting: Internal Medicine

## 2015-08-27 ENCOUNTER — Ambulatory Visit: Payer: Medicare Other | Admitting: Internal Medicine

## 2015-08-27 VITALS — BP 110/70 | HR 53 | Temp 97.6°F | Resp 18 | Ht 65.5 in | Wt 137.1 lb

## 2015-08-27 DIAGNOSIS — M353 Polymyalgia rheumatica: Secondary | ICD-10-CM

## 2015-08-27 DIAGNOSIS — I77810 Thoracic aortic ectasia: Secondary | ICD-10-CM

## 2015-08-27 DIAGNOSIS — E7800 Pure hypercholesterolemia, unspecified: Secondary | ICD-10-CM | POA: Diagnosis not present

## 2015-08-27 DIAGNOSIS — I10 Essential (primary) hypertension: Secondary | ICD-10-CM

## 2015-08-27 DIAGNOSIS — F419 Anxiety disorder, unspecified: Secondary | ICD-10-CM | POA: Diagnosis not present

## 2015-08-27 DIAGNOSIS — N200 Calculus of kidney: Secondary | ICD-10-CM | POA: Diagnosis not present

## 2015-08-27 DIAGNOSIS — I6509 Occlusion and stenosis of unspecified vertebral artery: Secondary | ICD-10-CM | POA: Diagnosis not present

## 2015-08-27 DIAGNOSIS — I6522 Occlusion and stenosis of left carotid artery: Secondary | ICD-10-CM

## 2015-08-27 MED ORDER — PAROXETINE HCL 20 MG PO TABS: 20.0000 mg | ORAL_TABLET | Freq: Every day | ORAL | Status: DC

## 2015-08-27 NOTE — Progress Notes (Signed)
[name], female   DOB: 1935-12-12, 80 y.o.   MRN: [id number]   Subjective:    [name], female    DOB: 05/15/1935, 80 y.o.   MRN: [id number]  HPI  Patient here for a scheduled follow up.  She recently had an abnormal CT angiogram.  S/p bilateral common carotid and innominate angiography and bilateral vertebral artery angiograms.  Results reviewed.  Recommended f/u MRI and MRA of the brain in 6 months.  She is accompanied by her daughter.  History obtained from both of them.  No significant headache.  No dizziness.  No chest pain.  No sob.  Eating and drinking.  Has lost weight.  Increased stress.  Discussed with her today.  Discussed increasing her paxil.  paxil has worked well for her in the past.  On a lower dose now.  Her blood pressure has been varying.  Elevation recently (at times).   She also states she wants to travel and needs a letter to take her service dog with her.    Past Medical History  Diagnosis Date  . History of kidney stones October 2013  . Arthritis   . Hyperlipidemia   . Hx: UTI (urinary tract infection)   . History of chicken pox    Past Surgical History  Procedure Laterality Date  . Breast biopsy Right 1995  . Tonsillectomy and adenoidectomy  1945  . Partial hysterectomy  1972   Family History  Problem Relation Age of Onset  . Heart disease Father    Social History   Social History  . Marital Status: Married    Spouse Name: N/A  . Number of Children: N/A  . Years of Education: N/A   Social History Main Topics  . Smoking status: Never Smoker   . Smokeless tobacco: Never Used  . Alcohol Use: No  . Drug Use: No  . Sexual Activity: Not Asked   Other Topics Concern  . None   Social History Narrative    Outpatient Encounter Prescriptions as of 08/27/2015  Medication Sig  . ALPRAZolam (XANAX) 0.5 MG tablet TAKE 1 TABLET AT BEDTIME AS NEEDED FOR ANXIETY.  Marland Kitchen amLODipine (NORVASC) 5 MG tablet Take 1 tablet (5 mg total) by mouth  daily.  Marland Kitchen atenolol (TENORMIN) 25 MG tablet Take 1 tablet (25 mg total) by mouth daily.  . clopidogrel (PLAVIX) 75 MG tablet Take 1 tablet (75 mg total) by mouth daily.  . folic acid (FOLVITE) Q000111Q MCG tablet Take 800 mcg by mouth daily.  . methotrexate (RHEUMATREX) 2.5 MG tablet Take 6 tablets per week. (Patient taking differently: Take 8 tablets per week.)  . vitamin C (ASCORBIC ACID) 500 MG tablet Take 500 mg by mouth daily.   . [DISCONTINUED] PARoxetine (PAXIL) 10 MG tablet Take 1 tablet (10 mg total) by mouth every morning.  Marland Kitchen PARoxetine (PAXIL) 20 MG tablet Take 1 tablet (20 mg total) by mouth daily.   No facility-administered encounter medications on file as of 08/27/2015.    Review of Systems  Constitutional: Negative for appetite change and unexpected weight change.  HENT: Negative for congestion and sinus pressure.   Respiratory: Negative for cough, chest tightness and shortness of breath.   Cardiovascular: Negative for chest pain, palpitations and leg swelling.  Gastrointestinal: Negative for nausea, vomiting, abdominal pain and diarrhea.  Genitourinary: Negative for dysuria and difficulty urinating.  Musculoskeletal: Negative for back pain and joint swelling.  Skin: Negative for color change and rash.  Neurological: Negative  for dizziness, light-headedness and headaches.  Psychiatric/Behavioral: Negative for dysphoric mood and agitation.       Objective:     Blood pressure rechecked by me:  144-146/80  Physical Exam  Constitutional: She appears well-developed and well-nourished. No distress.  HENT:  Nose: Nose normal.  Mouth/Throat: Oropharynx is clear and moist.  Neck: Neck supple. No thyromegaly present.  Cardiovascular: Normal rate and regular rhythm.   Pulmonary/Chest: Breath sounds normal. No respiratory distress. She has no wheezes.  Abdominal: Soft. Bowel sounds are normal. There is no tenderness.  Musculoskeletal: She exhibits no edema or tenderness.    Lymphadenopathy:    She has no cervical adenopathy.  Skin: No rash noted. No erythema.  Psychiatric: She has a normal mood and affect. Her behavior is normal.    BP 110/70 mmHg  Pulse 53  Temp(Src) 97.6 F (36.4 C) (Oral)  Resp 18  Ht 5' 5.5" (1.664 m)  Wt 137 lb 2 oz (62.199 kg)  BMI 22.46 kg/m2  SpO2 96% Wt Readings from Last 3 Encounters:  08/27/15 137 lb 2 oz (62.199 kg)  08/14/15 148 lb (67.132 kg)  07/20/15 142 lb 3.2 oz (64.5 kg)     Lab Results  Component Value Date   WBC 7.0 08/14/2015   HGB 13.9 08/14/2015   HCT 42.1 08/14/2015   PLT 323 08/14/2015   GLUCOSE 94 08/28/2015   CHOL 227* 08/28/2015   TRIG 168.0* 08/28/2015   HDL 39.50 08/28/2015   LDLDIRECT 137.0 10/17/2014   LDLCALC 154* 08/28/2015   ALT 14 08/28/2015   AST 19 08/28/2015   NA 141 08/28/2015   K 4.3 08/28/2015   CL 105 08/28/2015   CREATININE 0.86 08/28/2015   BUN 17 08/28/2015   CO2 30 08/28/2015   TSH 1.45 10/17/2014   INR 1.10 08/14/2015    Ir Angio Intra Extracran Sel Com Carotid Innominate Bilat Mod Sed  08/14/2015  CLINICAL DATA:  TIA.  Abnormal CT angiogram of the head and neck. EXAM: BILATERAL COMMON CAROTID AND INNOMINATE ANGIOGRAPHY AND BILATERAL VERTEBRAL ARTERY ANGIOGRAMS PROCEDURE: Contrast: Isovue  300 approximately 60 mL. Anesthesia/Sedation:  Conscious sedation. Medications: Versed 1 mg IV.  Fentanyl 25 mcg IV. Following a full explanation of the procedure along with the potential associated complications, an informed witnessed consent was obtained. The right groin was prepped and draped in the usual sterile fashion. Thereafter using modified Seldinger technique, transfemoral access into the right common femoral artery was obtained without difficulty. Over a 0.035 inch guidewire, a 5 French Pinnacle sheath was inserted. Through this, and also over 0.035 inch guidewire, a 5 [place] JB 1 catheter was advanced to the aortic arch region and selectively positioned in the right common  carotid artery, the right subclavian artery, the left common carotid artery and the left vertebral artery. There were no acute complications. The patient tolerated the procedure well. FINDINGS: The origin of the left vertebral artery is normal. The vessel is seen to opacify normally to the cranial skull base. Normal opacification is seen of the left posterior inferior cerebellar artery and the left vertebrobasilar junction. The basilar artery, the posterior cerebral artery, the superior cerebellar arteries and the anterior-inferior cerebellar arteries opacify normally into the capillary and venous phases. Non-opacified blood is seen in the proximal basilar artery from the contralateral vertebral artery. The right common carotid arteriogram demonstrates about 40-45% narrowing of the right internal carotid artery at the bulb associated with minimally irregular plaque. No acute ulcerations are seen. The vessel is otherwise  seen to opacify normally to the cranial skull base. The petrous, the cavernous and the supraclinoid segments are widely patent. The right middle cerebral artery and the right anterior cerebral artery opacify normally into the capillary and venous phases. Transient opacification of the right posterior communicating artery is seen with flash filling of the right posterior cerebral artery distribution. The origin of the right vertebral artery is normal. The vessel is seen to opacify normally to the cranial skull base. Wide patency is seen of the right vertebrobasilar junction and the right posterior-inferior cerebellar artery. The opacified portions of the basilar artery, the posterior cerebral artery, the superior cerebellar arteries and the anterior-inferior cerebellar arteries is normal into the capillary and venous phases. Non-opacified blood is seen in the basilar artery from the contralateral vertebral artery. The left common carotid arteriogram demonstrates approximately 50% narrowing of the  proximal left external carotid artery. Its branches, however, are normally opacified. The left internal carotid artery at the bulb to the cranial skull base is widely patent. The petrous, the cavernous and the supraclinoid segments are widely patent. The left middle cerebral artery demonstrates normal opacification into the capillary and venous phases. The left anterior cerebral artery at its origin demonstrates approximately 50-75% stenoses. Distal to this the left anterior cerebral artery appears normally opacified into the capillary and venous phases. The right external carotid artery also demonstrates mild narrowing at its origin. IMPRESSION: Approximately 40 to 45% stenosis of the right internal carotid artery at the bulb. Approximately 50% stenosis of the left external carotid artery at its origin. Approximately 50-75% narrowing of the left anterior cerebral artery proximal A1 segment. Mild stenosis of the right external carotid artery at its origin. The angiographic findings were reviewed with the patient. The patient has been asked to continue with aggressive medical management. A follow-up MRI of the brain and MRA of the brain will be undertaken in 6 months time. An ultrasound of the carotids will be also done in about 6 months time. Electronically Signed   By: Luanne Bras M.D.   On: 08/14/2015 12:26   Ir Angio Vertebral Sel Subclavian Innominate Bilat Mod Sed  08/14/2015  CLINICAL DATA:  TIA.  Abnormal CT angiogram of the head and neck. EXAM: BILATERAL COMMON CAROTID AND INNOMINATE ANGIOGRAPHY AND BILATERAL VERTEBRAL ARTERY ANGIOGRAMS PROCEDURE: Contrast: Isovue  300 approximately 60 mL. Anesthesia/Sedation:  Conscious sedation. Medications: Versed 1 mg IV.  Fentanyl 25 mcg IV. Following a full explanation of the procedure along with the potential associated complications, an informed witnessed consent was obtained. The right groin was prepped and draped in the usual sterile fashion. Thereafter  using modified Seldinger technique, transfemoral access into the right common femoral artery was obtained without difficulty. Over a 0.035 inch guidewire, a 5 French Pinnacle sheath was inserted. Through this, and also over 0.035 inch guidewire, a 5 [place] JB 1 catheter was advanced to the aortic arch region and selectively positioned in the right common carotid artery, the right subclavian artery, the left common carotid artery and the left vertebral artery. There were no acute complications. The patient tolerated the procedure well. FINDINGS: The origin of the left vertebral artery is normal. The vessel is seen to opacify normally to the cranial skull base. Normal opacification is seen of the left posterior inferior cerebellar artery and the left vertebrobasilar junction. The basilar artery, the posterior cerebral artery, the superior cerebellar arteries and the anterior-inferior cerebellar arteries opacify normally into the capillary and venous phases. Non-opacified blood is seen in  the proximal basilar artery from the contralateral vertebral artery. The right common carotid arteriogram demonstrates about 40-45% narrowing of the right internal carotid artery at the bulb associated with minimally irregular plaque. No acute ulcerations are seen. The vessel is otherwise seen to opacify normally to the cranial skull base. The petrous, the cavernous and the supraclinoid segments are widely patent. The right middle cerebral artery and the right anterior cerebral artery opacify normally into the capillary and venous phases. Transient opacification of the right posterior communicating artery is seen with flash filling of the right posterior cerebral artery distribution. The origin of the right vertebral artery is normal. The vessel is seen to opacify normally to the cranial skull base. Wide patency is seen of the right vertebrobasilar junction and the right posterior-inferior cerebellar artery. The opacified portions of  the basilar artery, the posterior cerebral artery, the superior cerebellar arteries and the anterior-inferior cerebellar arteries is normal into the capillary and venous phases. Non-opacified blood is seen in the basilar artery from the contralateral vertebral artery. The left common carotid arteriogram demonstrates approximately 50% narrowing of the proximal left external carotid artery. Its branches, however, are normally opacified. The left internal carotid artery at the bulb to the cranial skull base is widely patent. The petrous, the cavernous and the supraclinoid segments are widely patent. The left middle cerebral artery demonstrates normal opacification into the capillary and venous phases. The left anterior cerebral artery at its origin demonstrates approximately 50-75% stenoses. Distal to this the left anterior cerebral artery appears normally opacified into the capillary and venous phases. The right external carotid artery also demonstrates mild narrowing at its origin. IMPRESSION: Approximately 40 to 45% stenosis of the right internal carotid artery at the bulb. Approximately 50% stenosis of the left external carotid artery at its origin. Approximately 50-75% narrowing of the left anterior cerebral artery proximal A1 segment. Mild stenosis of the right external carotid artery at its origin. The angiographic findings were reviewed with the patient. The patient has been asked to continue with aggressive medical management. A follow-up MRI of the brain and MRA of the brain will be undertaken in 6 months time. An ultrasound of the carotids will be also done in about 6 months time. Electronically Signed   By: Luanne Bras M.D.   On: 08/14/2015 12:26       Assessment & Plan:   Problem List Items Addressed This Visit    Anxiety    On paxil.  Increased stress and anxiety.  Discussed increasing paxil.  Will increase to 20mg  q day.  Follow.        Relevant Medications   PARoxetine (PAXIL) 20 MG  tablet   Carotid stenosis    Angiography as outlined.  Recommended f/u MRI/MRA brain in  6 months.  Aggressive risk factor modification.        Ectatic thoracic aorta (HCC)    Found on imaging w/up for TIA.  Recommended f/u imaging in one year.        Essential hypertension    Blood pressure as outlined.  She is on amlodipine and atenolol.  Will follow.  Treat anxiety.  Follow pressures.  Get her back in soon to reassess.        Hypercholesterolemia - Primary    Discussed cholesterol levels.  Discussed her recent angiography results.  Discussed the need for more strict control of her cholesterol.  She has been resistant to starting cholesterol medication.   Discussed with her today.  Will check  cholesterol and treat if she is agreeable.        Nephrolithiasis    She feels is stable.  She declines any further evaluation.        PMR (polymyalgia rheumatica) (HCC)    On MTX.  Followed by Dr Jefm Bryant.           [name], MD

## 2015-08-27 NOTE — Progress Notes (Signed)
Pre-visit discussion using our clinic review tool. No additional management support is needed unless otherwise documented below in the visit note.  

## 2015-08-28 ENCOUNTER — Other Ambulatory Visit (INDEPENDENT_AMBULATORY_CARE_PROVIDER_SITE_OTHER): Payer: Medicare Other

## 2015-08-28 DIAGNOSIS — E7800 Pure hypercholesterolemia, unspecified: Secondary | ICD-10-CM

## 2015-08-28 DIAGNOSIS — R030 Elevated blood-pressure reading, without diagnosis of hypertension: Secondary | ICD-10-CM | POA: Diagnosis not present

## 2015-08-28 LAB — BASIC METABOLIC PANEL
BUN: 17 mg/dL (ref 6–23)
CHLORIDE: 105 meq/L (ref 96–112)
CO2: 30 mEq/L (ref 19–32)
Calcium: 9.7 mg/dL (ref 8.4–10.5)
Creatinine, Ser: 0.86 mg/dL (ref 0.40–1.20)
GFR: 67.82 mL/min (ref >60.00)
Glucose, Bld: 94 mg/dL (ref 70–99)
POTASSIUM: 4.3 meq/L (ref 3.5–5.1)
SODIUM: 141 meq/L (ref 135–145)

## 2015-08-28 LAB — LIPID PANEL
CHOL/HDL RATIO: 6
Cholesterol: 227 mg/dL — ABNORMAL HIGH (ref 0–200)
HDL: 39.5 mg/dL (ref >39.00)
LDL CALC: 154 mg/dL — AB (ref 0–99)
NonHDL: 187.9
TRIGLYCERIDES: 168 mg/dL — AB (ref 0.0–149.0)
VLDL: 33.6 mg/dL (ref 0.0–40.0)

## 2015-08-28 LAB — HEPATIC FUNCTION PANEL
ALBUMIN: 4.1 g/dL (ref 3.5–5.2)
ALT: 14 U/L (ref 0–35)
AST: 19 U/L (ref 0–37)
Alkaline Phosphatase: 65 U/L (ref 39–117)
Bilirubin, Direct: 0.1 mg/dL (ref 0.0–0.3)
TOTAL PROTEIN: 7.2 g/dL (ref 6.0–8.3)
Total Bilirubin: 0.5 mg/dL (ref 0.2–1.2)

## 2015-08-31 ENCOUNTER — Telehealth: Payer: Self-pay | Admitting: Internal Medicine

## 2015-08-31 MED ORDER — ROSUVASTATIN CALCIUM 5 MG PO TABS: 5.0000 mg | ORAL_TABLET | Freq: Every day | ORAL | Status: DC

## 2015-08-31 NOTE — Telephone Encounter (Signed)
Pt called back returning your call regarding lab results.   Call pt @ [phone]. Thank you!

## 2015-08-31 NOTE — Telephone Encounter (Signed)
She is agreeable to start Crestor 5mg  as noted below.  She will make lab appointment once medication is sent to pharmacy and started.

## 2015-08-31 NOTE — Telephone Encounter (Signed)
I have sent in the prescription for crestor.  Please call her and let her know that I sent in the prescription for crestor 5mg  one daily.  I want her to start by taking this only three days per week - q Monday, Wednesday and Friday.  Will need liver panel checked 6 weeks after starting the medication.  Thanks

## 2015-09-02 ENCOUNTER — Encounter: Payer: Self-pay | Admitting: Internal Medicine

## 2015-09-02 NOTE — Assessment & Plan Note (Signed)
She feels is stable.  She declines any further evaluation.

## 2015-09-02 NOTE — Assessment & Plan Note (Signed)
Angiography as outlined.  Recommended f/u MRI/MRA brain in  6 months.  Aggressive risk factor modification.

## 2015-09-02 NOTE — Assessment & Plan Note (Signed)
Found on imaging w/up for TIA.  Recommended f/u imaging in one year.

## 2015-09-02 NOTE — Assessment & Plan Note (Signed)
On paxil.  Increased stress and anxiety.  Discussed increasing paxil.  Will increase to 20mg  q day.  Follow.

## 2015-09-02 NOTE — Assessment & Plan Note (Signed)
Blood pressure as outlined.  She is on amlodipine and atenolol.  Will follow.  Treat anxiety.  Follow pressures.  Get her back in soon to reassess.

## 2015-09-02 NOTE — Assessment & Plan Note (Signed)
On MTX.  Followed by Dr Jefm Bryant.

## 2015-09-02 NOTE — Assessment & Plan Note (Signed)
Discussed cholesterol levels.  Discussed her recent angiography results.  Discussed the need for more strict control of her cholesterol.  She has been resistant to starting cholesterol medication.   Discussed with her today.  Will check cholesterol and treat if she is agreeable.

## 2015-09-03 NOTE — Telephone Encounter (Signed)
Crestor was picked up on Saturday.  Verbalized understanding of frequency, dose and route of medication.  6 week lab appointment scheduled.

## 2015-09-07 ENCOUNTER — Encounter: Payer: Self-pay | Admitting: Internal Medicine

## 2015-09-07 ENCOUNTER — Ambulatory Visit (INDEPENDENT_AMBULATORY_CARE_PROVIDER_SITE_OTHER): Payer: Medicare Other | Admitting: Internal Medicine

## 2015-09-07 VITALS — BP 110/70 | HR 56 | Temp 97.6°F | Resp 18 | Ht 65.0 in | Wt 137.4 lb

## 2015-09-07 DIAGNOSIS — I6509 Occlusion and stenosis of unspecified vertebral artery: Secondary | ICD-10-CM | POA: Diagnosis not present

## 2015-09-07 DIAGNOSIS — E7800 Pure hypercholesterolemia, unspecified: Secondary | ICD-10-CM | POA: Diagnosis not present

## 2015-09-07 DIAGNOSIS — Z0000 Encounter for general adult medical examination without abnormal findings: Secondary | ICD-10-CM | POA: Diagnosis not present

## 2015-09-07 DIAGNOSIS — Z86010 Personal history of colonic polyps: Secondary | ICD-10-CM

## 2015-09-07 DIAGNOSIS — I6522 Occlusion and stenosis of left carotid artery: Secondary | ICD-10-CM

## 2015-09-07 DIAGNOSIS — I10 Essential (primary) hypertension: Secondary | ICD-10-CM

## 2015-09-07 DIAGNOSIS — F419 Anxiety disorder, unspecified: Secondary | ICD-10-CM | POA: Diagnosis not present

## 2015-09-07 DIAGNOSIS — N200 Calculus of kidney: Secondary | ICD-10-CM

## 2015-09-07 DIAGNOSIS — I77810 Thoracic aortic ectasia: Secondary | ICD-10-CM

## 2015-09-07 DIAGNOSIS — M353 Polymyalgia rheumatica: Secondary | ICD-10-CM

## 2015-09-07 NOTE — Progress Notes (Signed)
Pre-visit discussion using our clinic review tool. No additional management support is needed unless otherwise documented below in the visit note.  

## 2015-09-07 NOTE — Progress Notes (Signed)
[name], female   DOB: June 07, 1935, 80 y.o.   MRN: [id number]   Subjective:    [name], female    DOB: 1935/12/13, 80 y.o.   MRN: [id number]  HPI  Patient with past history of anxiety/stress, hypertension, hypercholesterolemia, kidney stones and carotid and anterior cerebral stenosis as outlined.  She comes in today to follow up on these issues.  Recently evaluated for her stenosis.  Recommended f/u MRI/MRA brain in 6 months.  Stays active.  No chest pain.  No headache.  No dizziness.  No sob.  No acid reflux.  No abdominal pain or cramping.  Bowels stable.  Taking amlodipine.  Started crestor.  Tolerating.  Last visit, paxil was increased.  Doing better since the increase.     Past Medical History  Diagnosis Date  . History of kidney stones October 2013  . Arthritis   . Hyperlipidemia   . Hx: UTI (urinary tract infection)   . History of chicken pox    Past Surgical History  Procedure Laterality Date  . Breast biopsy Right 1995  . Tonsillectomy and adenoidectomy  1945  . Partial hysterectomy  1972   Family History  Problem Relation Age of Onset  . Heart disease Father    Social History   Social History  . Marital Status: Married    Spouse Name: N/A  . Number of Children: N/A  . Years of Education: N/A   Social History Main Topics  . Smoking status: Never Smoker   . Smokeless tobacco: Never Used  . Alcohol Use: No  . Drug Use: No  . Sexual Activity: Not Asked   Other Topics Concern  . None   Social History Narrative    Outpatient Encounter Prescriptions as of 09/07/2015  Medication Sig  . ALPRAZolam (XANAX) 0.5 MG tablet TAKE 1 TABLET AT BEDTIME AS NEEDED FOR ANXIETY.  Marland Kitchen amLODipine (NORVASC) 5 MG tablet Take 1 tablet (5 mg total) by mouth daily.  Marland Kitchen atenolol (TENORMIN) 25 MG tablet Take 1 tablet (25 mg total) by mouth daily.  . clopidogrel (PLAVIX) 75 MG tablet Take 1 tablet (75 mg total) by mouth daily.  . folic acid (FOLVITE) [id number] MCG tablet  Take 800 mcg by mouth daily.  . methotrexate (RHEUMATREX) 2.5 MG tablet Take 6 tablets per week. (Patient taking differently: Take 8 tablets per week.)  . PARoxetine (PAXIL) 20 MG tablet Take 1 tablet (20 mg total) by mouth daily.  . rosuvastatin (CRESTOR) 5 MG tablet Take 1 tablet (5 mg total) by mouth daily.  . vitamin C (ASCORBIC ACID) 500 MG tablet Take 500 mg by mouth daily.    No facility-administered encounter medications on file as of 09/07/2015.    Review of Systems  Constitutional: Negative for appetite change and unexpected weight change.  HENT: Negative for congestion and sinus pressure.   Eyes: Negative for pain and visual disturbance.  Respiratory: Negative for cough, chest tightness and shortness of breath.   Cardiovascular: Negative for chest pain, palpitations and leg swelling.  Gastrointestinal: Negative for nausea, vomiting, abdominal pain and diarrhea.  Genitourinary: Negative for dysuria and difficulty urinating.  Musculoskeletal: Negative for back pain and joint swelling.  Skin: Negative for color change and rash.  Neurological: Negative for dizziness, light-headedness and headaches.  Hematological: Negative for adenopathy. Does not bruise/bleed easily.  Psychiatric/Behavioral: Negative for dysphoric mood and agitation.       Objective:     Blood pressure rechecked by me:  138/68  Physical Exam  Constitutional: She is oriented to person, place, and time. She appears well-developed and well-nourished. No distress.  HENT:  Nose: Nose normal.  Mouth/Throat: Oropharynx is clear and moist.  Eyes: Conjunctivae are normal. Right eye exhibits no discharge. Left eye exhibits no discharge.  Neck: Neck supple. No thyromegaly present.  Cardiovascular: Normal rate and regular rhythm.   Pulmonary/Chest: Breath sounds normal. No respiratory distress. She has no wheezes.  Declined breast exam.   Abdominal: Soft. Bowel sounds are normal. There is no tenderness.    Musculoskeletal: She exhibits no edema or tenderness.  Lymphadenopathy:    She has no cervical adenopathy.  Neurological: She is alert and oriented to person, place, and time.  Skin: No rash noted. No erythema.  Psychiatric: She has a normal mood and affect. Her behavior is normal.    BP 110/70 mmHg  Pulse 56  Temp(Src) 97.6 F (36.4 C) (Oral)  Resp 18  Ht 5\' 5"  (1.651 m)  Wt 137 lb 6 oz (62.313 kg)  BMI 22.86 kg/m2  SpO2 96% Wt Readings from Last 3 Encounters:  09/07/15 137 lb 6 oz (62.313 kg)  08/27/15 137 lb 2 oz (62.199 kg)  08/14/15 148 lb (67.132 kg)     Lab Results  Component Value Date   WBC 7.0 08/14/2015   HGB 13.9 08/14/2015   HCT 42.1 08/14/2015   PLT 323 08/14/2015   GLUCOSE 94 08/28/2015   CHOL 227* 08/28/2015   TRIG 168.0* 08/28/2015   HDL 39.50 08/28/2015   LDLDIRECT 137.0 10/17/2014   LDLCALC 154* 08/28/2015   ALT 14 08/28/2015   AST 19 08/28/2015   NA 141 08/28/2015   K 4.3 08/28/2015   CL 105 08/28/2015   CREATININE 0.86 08/28/2015   BUN 17 08/28/2015   CO2 30 08/28/2015   TSH 1.45 10/17/2014   INR 1.10 08/14/2015    Ir Angio Intra Extracran Sel Com Carotid Innominate Bilat Mod Sed  08/14/2015  CLINICAL DATA:  TIA.  Abnormal CT angiogram of the head and neck. EXAM: BILATERAL COMMON CAROTID AND INNOMINATE ANGIOGRAPHY AND BILATERAL VERTEBRAL ARTERY ANGIOGRAMS PROCEDURE: Contrast: Isovue  300 approximately 60 mL. Anesthesia/Sedation:  Conscious sedation. Medications: Versed 1 mg IV.  Fentanyl 25 mcg IV. Following a full explanation of the procedure along with the potential associated complications, an informed witnessed consent was obtained. The right groin was prepped and draped in the usual sterile fashion. Thereafter using modified Seldinger technique, transfemoral access into the right common femoral artery was obtained without difficulty. Over a 0.035 inch guidewire, a 5 French Pinnacle sheath was inserted. Through this, and also over 0.035 inch  guidewire, a 5 [place] JB 1 catheter was advanced to the aortic arch region and selectively positioned in the right common carotid artery, the right subclavian artery, the left common carotid artery and the left vertebral artery. There were no acute complications. The patient tolerated the procedure well. FINDINGS: The origin of the left vertebral artery is normal. The vessel is seen to opacify normally to the cranial skull base. Normal opacification is seen of the left posterior inferior cerebellar artery and the left vertebrobasilar junction. The basilar artery, the posterior cerebral artery, the superior cerebellar arteries and the anterior-inferior cerebellar arteries opacify normally into the capillary and venous phases. Non-opacified blood is seen in the proximal basilar artery from the contralateral vertebral artery. The right common carotid arteriogram demonstrates about 40-45% narrowing of the right internal carotid artery at the bulb associated with minimally irregular plaque. No  acute ulcerations are seen. The vessel is otherwise seen to opacify normally to the cranial skull base. The petrous, the cavernous and the supraclinoid segments are widely patent. The right middle cerebral artery and the right anterior cerebral artery opacify normally into the capillary and venous phases. Transient opacification of the right posterior communicating artery is seen with flash filling of the right posterior cerebral artery distribution. The origin of the right vertebral artery is normal. The vessel is seen to opacify normally to the cranial skull base. Wide patency is seen of the right vertebrobasilar junction and the right posterior-inferior cerebellar artery. The opacified portions of the basilar artery, the posterior cerebral artery, the superior cerebellar arteries and the anterior-inferior cerebellar arteries is normal into the capillary and venous phases. Non-opacified blood is seen in the basilar artery from  the contralateral vertebral artery. The left common carotid arteriogram demonstrates approximately 50% narrowing of the proximal left external carotid artery. Its branches, however, are normally opacified. The left internal carotid artery at the bulb to the cranial skull base is widely patent. The petrous, the cavernous and the supraclinoid segments are widely patent. The left middle cerebral artery demonstrates normal opacification into the capillary and venous phases. The left anterior cerebral artery at its origin demonstrates approximately 50-75% stenoses. Distal to this the left anterior cerebral artery appears normally opacified into the capillary and venous phases. The right external carotid artery also demonstrates mild narrowing at its origin. IMPRESSION: Approximately 40 to 45% stenosis of the right internal carotid artery at the bulb. Approximately 50% stenosis of the left external carotid artery at its origin. Approximately 50-75% narrowing of the left anterior cerebral artery proximal A1 segment. Mild stenosis of the right external carotid artery at its origin. The angiographic findings were reviewed with the patient. The patient has been asked to continue with aggressive medical management. A follow-up MRI of the brain and MRA of the brain will be undertaken in 6 months time. An ultrasound of the carotids will be also done in about 6 months time. Electronically Signed   By: Luanne Bras M.D.   On: 08/14/2015 12:26   Ir Angio Vertebral Sel Subclavian Innominate Bilat Mod Sed  08/14/2015  CLINICAL DATA:  TIA.  Abnormal CT angiogram of the head and neck. EXAM: BILATERAL COMMON CAROTID AND INNOMINATE ANGIOGRAPHY AND BILATERAL VERTEBRAL ARTERY ANGIOGRAMS PROCEDURE: Contrast: Isovue  300 approximately 60 mL. Anesthesia/Sedation:  Conscious sedation. Medications: Versed 1 mg IV.  Fentanyl 25 mcg IV. Following a full explanation of the procedure along with the potential associated complications, an  informed witnessed consent was obtained. The right groin was prepped and draped in the usual sterile fashion. Thereafter using modified Seldinger technique, transfemoral access into the right common femoral artery was obtained without difficulty. Over a 0.035 inch guidewire, a 5 French Pinnacle sheath was inserted. Through this, and also over 0.035 inch guidewire, a 5 [place] JB 1 catheter was advanced to the aortic arch region and selectively positioned in the right common carotid artery, the right subclavian artery, the left common carotid artery and the left vertebral artery. There were no acute complications. The patient tolerated the procedure well. FINDINGS: The origin of the left vertebral artery is normal. The vessel is seen to opacify normally to the cranial skull base. Normal opacification is seen of the left posterior inferior cerebellar artery and the left vertebrobasilar junction. The basilar artery, the posterior cerebral artery, the superior cerebellar arteries and the anterior-inferior cerebellar arteries opacify normally into the capillary  and venous phases. Non-opacified blood is seen in the proximal basilar artery from the contralateral vertebral artery. The right common carotid arteriogram demonstrates about 40-45% narrowing of the right internal carotid artery at the bulb associated with minimally irregular plaque. No acute ulcerations are seen. The vessel is otherwise seen to opacify normally to the cranial skull base. The petrous, the cavernous and the supraclinoid segments are widely patent. The right middle cerebral artery and the right anterior cerebral artery opacify normally into the capillary and venous phases. Transient opacification of the right posterior communicating artery is seen with flash filling of the right posterior cerebral artery distribution. The origin of the right vertebral artery is normal. The vessel is seen to opacify normally to the cranial skull base. Wide patency is  seen of the right vertebrobasilar junction and the right posterior-inferior cerebellar artery. The opacified portions of the basilar artery, the posterior cerebral artery, the superior cerebellar arteries and the anterior-inferior cerebellar arteries is normal into the capillary and venous phases. Non-opacified blood is seen in the basilar artery from the contralateral vertebral artery. The left common carotid arteriogram demonstrates approximately 50% narrowing of the proximal left external carotid artery. Its branches, however, are normally opacified. The left internal carotid artery at the bulb to the cranial skull base is widely patent. The petrous, the cavernous and the supraclinoid segments are widely patent. The left middle cerebral artery demonstrates normal opacification into the capillary and venous phases. The left anterior cerebral artery at its origin demonstrates approximately 50-75% stenoses. Distal to this the left anterior cerebral artery appears normally opacified into the capillary and venous phases. The right external carotid artery also demonstrates mild narrowing at its origin. IMPRESSION: Approximately 40 to 45% stenosis of the right internal carotid artery at the bulb. Approximately 50% stenosis of the left external carotid artery at its origin. Approximately 50-75% narrowing of the left anterior cerebral artery proximal A1 segment. Mild stenosis of the right external carotid artery at its origin. The angiographic findings were reviewed with the patient. The patient has been asked to continue with aggressive medical management. A follow-up MRI of the brain and MRA of the brain will be undertaken in 6 months time. An ultrasound of the carotids will be also done in about 6 months time. Electronically Signed   By: Luanne Bras M.D.   On: 08/14/2015 12:26       Assessment & Plan:   Problem List Items Addressed This Visit    Anxiety    Better since increasing paxil.  Follow.  Xanax  in the evening.       Carotid stenosis    Angiography as outlined.  Recommended f/u MRI/MRA brain in 6 months.  Continue aggressive risk factor modification.        Ectatic thoracic aorta (HCC)    Found on imaging w/u for TIA.  Recommended f/u imaging in one year.        Essential hypertension    Blood pressure better.  Continue current regimen.  Follow pressures.  Follow metabolic panel.        Health care maintenance - Primary    Physical today 09/07/15.  Declines mammogram.  Colonoscopy as outlined.        History of colonic polyps    Last colonoscopy 12/24/12 as outlined.  Recommended f/u in 12/2017.        Hypercholesterolemia    On crestor now.  Tolerating.  Follow liver panel and lipid panel.  Nephrolithiasis    She feels is stable.  Declines further evaluation.        PMR (polymyalgia rheumatica) (HCC)    On MTX.  Followed by Dr Jefm Bryant.  Stable.            [name], MD

## 2015-09-07 NOTE — Assessment & Plan Note (Signed)
Physical today 09/07/15.  Declines mammogram.  Colonoscopy as outlined.

## 2015-09-09 ENCOUNTER — Encounter: Payer: Self-pay | Admitting: Internal Medicine

## 2015-09-09 NOTE — Assessment & Plan Note (Signed)
Blood pressure better.  Continue current regimen.  Follow pressures.  Follow metabolic panel.

## 2015-09-09 NOTE — Assessment & Plan Note (Signed)
She feels is stable.  Declines further evaluation.

## 2015-09-09 NOTE — Assessment & Plan Note (Signed)
Found on imaging w/u for TIA.  Recommended f/u imaging in one year.

## 2015-09-09 NOTE — Assessment & Plan Note (Signed)
On crestor now.  Tolerating.  Follow liver panel and lipid panel.

## 2015-09-09 NOTE — Assessment & Plan Note (Signed)
On MTX.  Followed by Dr Jefm Bryant.  Stable.

## 2015-09-09 NOTE — Assessment & Plan Note (Signed)
Angiography as outlined.  Recommended f/u MRI/MRA brain in 6 months.  Continue aggressive risk factor modification.

## 2015-09-09 NOTE — Assessment & Plan Note (Signed)
Better since increasing paxil.  Follow.  Xanax in the evening.

## 2015-09-09 NOTE — Assessment & Plan Note (Signed)
Last colonoscopy 12/24/12 as outlined.  Recommended f/u in 12/2017.

## 2015-09-23 ENCOUNTER — Other Ambulatory Visit: Payer: Self-pay | Admitting: Internal Medicine

## 2015-09-23 NOTE — Telephone Encounter (Signed)
ok'd xanax #30 with one refill.   

## 2015-09-24 ENCOUNTER — Other Ambulatory Visit: Payer: Self-pay | Admitting: Internal Medicine

## 2015-10-05 ENCOUNTER — Telehealth: Payer: Self-pay

## 2015-10-05 DIAGNOSIS — E7800 Pure hypercholesterolemia, unspecified: Secondary | ICD-10-CM

## 2015-10-05 NOTE — Telephone Encounter (Signed)
Pt coming for labs 10/08/15. Looks like we may be rechecking liver functions. Please place future order. Thank you.

## 2015-10-05 NOTE — Telephone Encounter (Signed)
Order placed for f/u liver panel.  

## 2015-10-08 ENCOUNTER — Telehealth: Payer: Self-pay | Admitting: Internal Medicine

## 2015-10-08 ENCOUNTER — Other Ambulatory Visit (INDEPENDENT_AMBULATORY_CARE_PROVIDER_SITE_OTHER): Payer: Medicare Other

## 2015-10-08 DIAGNOSIS — E7800 Pure hypercholesterolemia, unspecified: Secondary | ICD-10-CM

## 2015-10-08 LAB — HEPATIC FUNCTION PANEL
ALBUMIN: 4.2 g/dL (ref 3.5–5.2)
ALK PHOS: 67 U/L (ref 39–117)
ALT: 14 U/L (ref 0–35)
AST: 19 U/L (ref 0–37)
Bilirubin, Direct: 0.1 mg/dL (ref 0.0–0.3)
TOTAL PROTEIN: 7.1 g/dL (ref 6.0–8.3)
Total Bilirubin: 0.3 mg/dL (ref 0.2–1.2)

## 2015-10-08 NOTE — Telephone Encounter (Signed)
Pt wants to know when she is supposed to come back in for an appt?  Call pt @ [phone]. Thank you!

## 2015-10-08 NOTE — Telephone Encounter (Signed)
Per Dr Nicki Reaper AVS:    Return in about 8 weeks (around 11/02/2015) for follow up appt (35min).

## 2015-10-09 NOTE — Telephone Encounter (Signed)
Ok. Pt is scheduled. Thank you! °

## 2015-10-12 ENCOUNTER — Telehealth: Payer: Self-pay | Admitting: *Deleted

## 2015-10-12 ENCOUNTER — Other Ambulatory Visit: Payer: Self-pay | Admitting: Family Medicine

## 2015-10-12 NOTE — Telephone Encounter (Signed)
ok'd rx for plavix #30 with 2 refills.

## 2015-10-12 NOTE — Telephone Encounter (Signed)
error 

## 2015-10-16 DIAGNOSIS — Z79899 Other long term (current) drug therapy: Secondary | ICD-10-CM | POA: Diagnosis not present

## 2015-10-16 DIAGNOSIS — M1990 Unspecified osteoarthritis, unspecified site: Secondary | ICD-10-CM | POA: Diagnosis not present

## 2015-10-19 DIAGNOSIS — M353 Polymyalgia rheumatica: Secondary | ICD-10-CM | POA: Diagnosis not present

## 2015-10-19 DIAGNOSIS — Z79899 Other long term (current) drug therapy: Secondary | ICD-10-CM | POA: Diagnosis not present

## 2015-10-19 DIAGNOSIS — M1990 Unspecified osteoarthritis, unspecified site: Secondary | ICD-10-CM | POA: Diagnosis not present

## 2015-10-22 ENCOUNTER — Encounter: Payer: Self-pay | Admitting: *Deleted

## 2015-11-08 ENCOUNTER — Other Ambulatory Visit: Payer: Self-pay | Admitting: Internal Medicine

## 2015-11-19 ENCOUNTER — Other Ambulatory Visit: Payer: Self-pay | Admitting: Internal Medicine

## 2015-11-19 NOTE — Telephone Encounter (Signed)
Last seen 09/07/15 ok to fill?

## 2015-11-20 NOTE — Telephone Encounter (Signed)
rx ok'd for xanax #30 with one refill  

## 2015-12-10 ENCOUNTER — Other Ambulatory Visit: Payer: Self-pay | Admitting: Internal Medicine

## 2015-12-11 ENCOUNTER — Other Ambulatory Visit: Payer: Self-pay | Admitting: Internal Medicine

## 2015-12-24 ENCOUNTER — Encounter: Payer: Self-pay | Admitting: Internal Medicine

## 2015-12-24 ENCOUNTER — Ambulatory Visit (INDEPENDENT_AMBULATORY_CARE_PROVIDER_SITE_OTHER): Payer: Medicare Other | Admitting: Internal Medicine

## 2015-12-24 DIAGNOSIS — I10 Essential (primary) hypertension: Secondary | ICD-10-CM | POA: Diagnosis not present

## 2015-12-24 DIAGNOSIS — M353 Polymyalgia rheumatica: Secondary | ICD-10-CM

## 2015-12-24 DIAGNOSIS — I6509 Occlusion and stenosis of unspecified vertebral artery: Secondary | ICD-10-CM

## 2015-12-24 DIAGNOSIS — F419 Anxiety disorder, unspecified: Secondary | ICD-10-CM

## 2015-12-24 DIAGNOSIS — E7800 Pure hypercholesterolemia, unspecified: Secondary | ICD-10-CM

## 2015-12-24 DIAGNOSIS — I77810 Thoracic aortic ectasia: Secondary | ICD-10-CM

## 2015-12-24 DIAGNOSIS — I6522 Occlusion and stenosis of left carotid artery: Secondary | ICD-10-CM

## 2015-12-24 NOTE — Progress Notes (Signed)
Pre visit review using our clinic review tool, if applicable. No additional management support is needed unless otherwise documented below in the visit note. 

## 2015-12-24 NOTE — Progress Notes (Signed)
[name], female   DOB: Mar 18, 1935, 80 y.o.   MRN: [id number]   Subjective:    [name], female    DOB: September 02, 1935, 80 y.o.   MRN: [id number]  HPI  Patient here for a scheduled follow up.  States she feels good.  Feels much better.  Staying active.  No chest pain.  No sob.  Blood pressure has been better.  No abdominal pain or cramping.  Bowels stable.  Dealing with stress better.     Past Medical History:  Diagnosis Date  . Arthritis   . History of chicken pox   . History of kidney stones October 2013  . Hx: UTI (urinary tract infection)   . Hyperlipidemia    Past Surgical History:  Procedure Laterality Date  . BREAST BIOPSY Right 1995  . PARTIAL HYSTERECTOMY  1972  . TONSILLECTOMY AND ADENOIDECTOMY  1945   Family History  Problem Relation Age of Onset  . Heart disease Father    Social History   Social History  . Marital status: Married    Spouse name: N/A  . Number of children: N/A  . Years of education: N/A   Social History Main Topics  . Smoking status: Never Smoker  . Smokeless tobacco: Never Used  . Alcohol use No  . Drug use: No  . Sexual activity: Not Asked   Other Topics Concern  . None   Social History Narrative  . None    Outpatient Encounter Prescriptions as of 12/24/2015  Medication Sig  . ALPRAZolam (XANAX) 0.5 MG tablet TAKE 1 TABLET AT BEDTIME AS NEEDED FOR ANXIETY.  Marland Kitchen amLODipine (NORVASC) 5 MG tablet Take 1 tablet (5 mg total) by mouth daily.  Marland Kitchen atenolol (TENORMIN) 25 MG tablet Take 1 tablet (25 mg total) by mouth daily.  . clopidogrel (PLAVIX) 75 MG tablet Take 1 tablet (75 mg total) by mouth daily.  . folic acid (FOLVITE) [id number] MCG tablet Take 800 mcg by mouth daily.  . methotrexate (RHEUMATREX) 2.5 MG tablet Take 6 tablets per week. (Patient taking differently: Take 8 tablets per week.)  . PARoxetine (PAXIL) 20 MG tablet Take 1 tablet (20 mg total) by mouth daily.  . rosuvastatin (CRESTOR) 5 MG tablet Take 1 tablet (5 mg  total) by mouth daily.  . vitamin C (ASCORBIC ACID) 500 MG tablet Take 500 mg by mouth daily.    No facility-administered encounter medications on file as of 12/24/2015.     Review of Systems  Constitutional: Negative for appetite change and unexpected weight change.  HENT: Negative for congestion and sinus pressure.   Respiratory: Negative for cough, chest tightness and shortness of breath.   Cardiovascular: Negative for chest pain, palpitations and leg swelling.  Gastrointestinal: Negative for abdominal pain, diarrhea, nausea and vomiting.  Genitourinary: Negative for difficulty urinating and dysuria.  Musculoskeletal: Negative for back pain and joint swelling.  Skin: Negative for color change and rash.  Neurological: Negative for dizziness, light-headedness and headaches.  Psychiatric/Behavioral: Negative for agitation and dysphoric mood.       Objective:    Physical Exam  Constitutional: She appears well-developed and well-nourished. No distress.  HENT:  Nose: Nose normal.  Mouth/Throat: Oropharynx is clear and moist.  Neck: Neck supple. No thyromegaly present.  Cardiovascular: Normal rate and regular rhythm.   Pulmonary/Chest: Breath sounds normal. No respiratory distress. She has no wheezes.  Abdominal: Soft. Bowel sounds are normal. There is no tenderness.  Musculoskeletal: She exhibits no edema or  tenderness.  Lymphadenopathy:    She has no cervical adenopathy.  Skin: No rash noted. No erythema.  Psychiatric: She has a normal mood and affect. Her behavior is normal.    BP 122/68   Pulse 61   Temp 97.8 F (36.6 C) (Oral)   Ht 5\' 5"  (1.651 m)   Wt 137 lb 6.4 oz (62.3 kg)   SpO2 96%   BMI 22.86 kg/m  Wt Readings from Last 3 Encounters:  12/24/15 137 lb 6.4 oz (62.3 kg)  09/07/15 137 lb 6 oz (62.3 kg)  08/27/15 137 lb 2 oz (62.2 kg)     Lab Results  Component Value Date   WBC 7.0 08/14/2015   HGB 13.9 08/14/2015   HCT 42.1 08/14/2015   PLT 323 08/14/2015    GLUCOSE 94 08/28/2015   CHOL 227 (H) 08/28/2015   TRIG 168.0 (H) 08/28/2015   HDL 39.50 08/28/2015   LDLDIRECT 137.0 10/17/2014   LDLCALC 154 (H) 08/28/2015   ALT 14 10/08/2015   AST 19 10/08/2015   NA 141 08/28/2015   K 4.3 08/28/2015   CL 105 08/28/2015   CREATININE 0.86 08/28/2015   BUN 17 08/28/2015   CO2 30 08/28/2015   TSH 1.45 10/17/2014   INR 1.10 08/14/2015    Ir Angio Intra Extracran Sel Com Carotid Innominate Bilat Mod Sed  Result Date: 08/14/2015 CLINICAL DATA:  TIA.  Abnormal CT angiogram of the head and neck. EXAM: BILATERAL COMMON CAROTID AND INNOMINATE ANGIOGRAPHY AND BILATERAL VERTEBRAL ARTERY ANGIOGRAMS PROCEDURE: Contrast: Isovue  300 approximately 60 mL. Anesthesia/Sedation:  Conscious sedation. Medications: Versed 1 mg IV.  Fentanyl 25 mcg IV. Following a full explanation of the procedure along with the potential associated complications, an informed witnessed consent was obtained. The right groin was prepped and draped in the usual sterile fashion. Thereafter using modified Seldinger technique, transfemoral access into the right common femoral artery was obtained without difficulty. Over a 0.035 inch guidewire, a 5 French Pinnacle sheath was inserted. Through this, and also over 0.035 inch guidewire, a 5 [place] JB 1 catheter was advanced to the aortic arch region and selectively positioned in the right common carotid artery, the right subclavian artery, the left common carotid artery and the left vertebral artery. There were no acute complications. The patient tolerated the procedure well. FINDINGS: The origin of the left vertebral artery is normal. The vessel is seen to opacify normally to the cranial skull base. Normal opacification is seen of the left posterior inferior cerebellar artery and the left vertebrobasilar junction. The basilar artery, the posterior cerebral artery, the superior cerebellar arteries and the anterior-inferior cerebellar arteries opacify  normally into the capillary and venous phases. Non-opacified blood is seen in the proximal basilar artery from the contralateral vertebral artery. The right common carotid arteriogram demonstrates about 40-45% narrowing of the right internal carotid artery at the bulb associated with minimally irregular plaque. No acute ulcerations are seen. The vessel is otherwise seen to opacify normally to the cranial skull base. The petrous, the cavernous and the supraclinoid segments are widely patent. The right middle cerebral artery and the right anterior cerebral artery opacify normally into the capillary and venous phases. Transient opacification of the right posterior communicating artery is seen with flash filling of the right posterior cerebral artery distribution. The origin of the right vertebral artery is normal. The vessel is seen to opacify normally to the cranial skull base. Wide patency is seen of the right vertebrobasilar junction and the right posterior-inferior  cerebellar artery. The opacified portions of the basilar artery, the posterior cerebral artery, the superior cerebellar arteries and the anterior-inferior cerebellar arteries is normal into the capillary and venous phases. Non-opacified blood is seen in the basilar artery from the contralateral vertebral artery. The left common carotid arteriogram demonstrates approximately 50% narrowing of the proximal left external carotid artery. Its branches, however, are normally opacified. The left internal carotid artery at the bulb to the cranial skull base is widely patent. The petrous, the cavernous and the supraclinoid segments are widely patent. The left middle cerebral artery demonstrates normal opacification into the capillary and venous phases. The left anterior cerebral artery at its origin demonstrates approximately 50-75% stenoses. Distal to this the left anterior cerebral artery appears normally opacified into the capillary and venous phases. The right  external carotid artery also demonstrates mild narrowing at its origin. IMPRESSION: Approximately 40 to 45% stenosis of the right internal carotid artery at the bulb. Approximately 50% stenosis of the left external carotid artery at its origin. Approximately 50-75% narrowing of the left anterior cerebral artery proximal A1 segment. Mild stenosis of the right external carotid artery at its origin. The angiographic findings were reviewed with the patient. The patient has been asked to continue with aggressive medical management. A follow-up MRI of the brain and MRA of the brain will be undertaken in 6 months time. An ultrasound of the carotids will be also done in about 6 months time. Electronically Signed   By: Luanne Bras M.D.   On: 08/14/2015 12:26   Ir Angio Vertebral Sel Subclavian Innominate Bilat Mod Sed  Result Date: 08/14/2015 CLINICAL DATA:  TIA.  Abnormal CT angiogram of the head and neck. EXAM: BILATERAL COMMON CAROTID AND INNOMINATE ANGIOGRAPHY AND BILATERAL VERTEBRAL ARTERY ANGIOGRAMS PROCEDURE: Contrast: Isovue  300 approximately 60 mL. Anesthesia/Sedation:  Conscious sedation. Medications: Versed 1 mg IV.  Fentanyl 25 mcg IV. Following a full explanation of the procedure along with the potential associated complications, an informed witnessed consent was obtained. The right groin was prepped and draped in the usual sterile fashion. Thereafter using modified Seldinger technique, transfemoral access into the right common femoral artery was obtained without difficulty. Over a 0.035 inch guidewire, a 5 French Pinnacle sheath was inserted. Through this, and also over 0.035 inch guidewire, a 5 [place] JB 1 catheter was advanced to the aortic arch region and selectively positioned in the right common carotid artery, the right subclavian artery, the left common carotid artery and the left vertebral artery. There were no acute complications. The patient tolerated the procedure well. FINDINGS: The  origin of the left vertebral artery is normal. The vessel is seen to opacify normally to the cranial skull base. Normal opacification is seen of the left posterior inferior cerebellar artery and the left vertebrobasilar junction. The basilar artery, the posterior cerebral artery, the superior cerebellar arteries and the anterior-inferior cerebellar arteries opacify normally into the capillary and venous phases. Non-opacified blood is seen in the proximal basilar artery from the contralateral vertebral artery. The right common carotid arteriogram demonstrates about 40-45% narrowing of the right internal carotid artery at the bulb associated with minimally irregular plaque. No acute ulcerations are seen. The vessel is otherwise seen to opacify normally to the cranial skull base. The petrous, the cavernous and the supraclinoid segments are widely patent. The right middle cerebral artery and the right anterior cerebral artery opacify normally into the capillary and venous phases. Transient opacification of the right posterior communicating artery is seen with flash  filling of the right posterior cerebral artery distribution. The origin of the right vertebral artery is normal. The vessel is seen to opacify normally to the cranial skull base. Wide patency is seen of the right vertebrobasilar junction and the right posterior-inferior cerebellar artery. The opacified portions of the basilar artery, the posterior cerebral artery, the superior cerebellar arteries and the anterior-inferior cerebellar arteries is normal into the capillary and venous phases. Non-opacified blood is seen in the basilar artery from the contralateral vertebral artery. The left common carotid arteriogram demonstrates approximately 50% narrowing of the proximal left external carotid artery. Its branches, however, are normally opacified. The left internal carotid artery at the bulb to the cranial skull base is widely patent. The petrous, the cavernous  and the supraclinoid segments are widely patent. The left middle cerebral artery demonstrates normal opacification into the capillary and venous phases. The left anterior cerebral artery at its origin demonstrates approximately 50-75% stenoses. Distal to this the left anterior cerebral artery appears normally opacified into the capillary and venous phases. The right external carotid artery also demonstrates mild narrowing at its origin. IMPRESSION: Approximately 40 to 45% stenosis of the right internal carotid artery at the bulb. Approximately 50% stenosis of the left external carotid artery at its origin. Approximately 50-75% narrowing of the left anterior cerebral artery proximal A1 segment. Mild stenosis of the right external carotid artery at its origin. The angiographic findings were reviewed with the patient. The patient has been asked to continue with aggressive medical management. A follow-up MRI of the brain and MRA of the brain will be undertaken in 6 months time. An ultrasound of the carotids will be also done in about 6 months time. Electronically Signed   By: Luanne Bras M.D.   On: 08/14/2015 12:26       Assessment & Plan:   Problem List Items Addressed This Visit    Anxiety    Doing better since increasing paxil.  Feels better.  Follow.       Carotid stenosis    Angiography as outlined.  Recommended f/u MRI/MRA in 6 months.  Continue aggressive risk factor modification.        Ectatic thoracic aorta (HCC)    Found on imaging w/up for TIA.  Recommended f/u imaging in one year.        Essential hypertension    Blood pressure doing much better on current regimen.  Follow pressures.  Follow metabolic panel.        Relevant Orders   TSH   Basic metabolic panel   Hypercholesterolemia    Low cholesterol diet and exercise.  On crestor and tolerating.  Follow lipid panel and liver function tests.   Lab Results  Component Value Date   CHOL 227 (H) 08/28/2015   HDL 39.50  08/28/2015   LDLCALC 154 (H) 08/28/2015   LDLDIRECT 137.0 10/17/2014   TRIG 168.0 (H) 08/28/2015   CHOLHDL 6 08/28/2015        Relevant Orders   Lipid panel   PMR (polymyalgia rheumatica) (HCC)    On MTX.  Followed by Dr Jefm Bryant.         Other Visit Diagnoses   None.      [name], MD

## 2015-12-30 ENCOUNTER — Encounter: Payer: Self-pay | Admitting: Internal Medicine

## 2015-12-30 NOTE — Assessment & Plan Note (Signed)
Found on imaging w/up for TIA.  Recommended f/u imaging in one year.

## 2015-12-30 NOTE — Assessment & Plan Note (Signed)
Angiography as outlined.  Recommended f/u MRI/MRA in 6 months.  Continue aggressive risk factor modification.

## 2015-12-30 NOTE — Assessment & Plan Note (Signed)
Doing better since increasing paxil.  Feels better.  Follow.

## 2015-12-30 NOTE — Assessment & Plan Note (Signed)
Blood pressure doing much better on current regimen.  Follow pressures.  Follow metabolic panel.

## 2015-12-30 NOTE — Assessment & Plan Note (Signed)
Low cholesterol diet and exercise.  On crestor and tolerating.  Follow lipid panel and liver function tests.   Lab Results  Component Value Date   CHOL 227 (H) 08/28/2015   HDL 39.50 08/28/2015   LDLCALC 154 (H) 08/28/2015   LDLDIRECT 137.0 10/17/2014   TRIG 168.0 (H) 08/28/2015   CHOLHDL 6 08/28/2015

## 2015-12-30 NOTE — Assessment & Plan Note (Signed)
On MTX.  Followed by Dr Kernodle.  

## 2016-01-04 DIAGNOSIS — Z23 Encounter for immunization: Secondary | ICD-10-CM | POA: Diagnosis not present

## 2016-01-07 ENCOUNTER — Other Ambulatory Visit: Payer: Self-pay | Admitting: Internal Medicine

## 2016-01-14 ENCOUNTER — Other Ambulatory Visit: Payer: Self-pay | Admitting: Internal Medicine

## 2016-01-14 NOTE — Telephone Encounter (Signed)
Last filled 12/17/15. Last OV 12/24/15.

## 2016-01-15 ENCOUNTER — Other Ambulatory Visit (INDEPENDENT_AMBULATORY_CARE_PROVIDER_SITE_OTHER): Payer: Medicare Other

## 2016-01-15 DIAGNOSIS — E7800 Pure hypercholesterolemia, unspecified: Secondary | ICD-10-CM

## 2016-01-15 DIAGNOSIS — I10 Essential (primary) hypertension: Secondary | ICD-10-CM | POA: Diagnosis not present

## 2016-01-15 LAB — LIPID PANEL
CHOL/HDL RATIO: 4
Cholesterol: 191 mg/dL (ref 0–200)
HDL: 48.2 mg/dL (ref >39.00)
LDL Cholesterol: 118 mg/dL — ABNORMAL HIGH (ref 0–99)
NONHDL: 142.97
Triglycerides: 124 mg/dL (ref 0.0–149.0)
VLDL: 24.8 mg/dL (ref 0.0–40.0)

## 2016-01-15 LAB — BASIC METABOLIC PANEL
BUN: 21 mg/dL (ref 6–23)
CO2: 29 mEq/L (ref 19–32)
Calcium: 9.5 mg/dL (ref 8.4–10.5)
Chloride: 105 mEq/L (ref 96–112)
Creatinine, Ser: 0.89 mg/dL (ref 0.40–1.20)
GFR: 65.13 mL/min (ref >60.00)
Glucose, Bld: 92 mg/dL (ref 70–99)
POTASSIUM: 4.3 meq/L (ref 3.5–5.1)
SODIUM: 140 meq/L (ref 135–145)

## 2016-01-15 LAB — TSH: TSH: 2.74 u[IU]/mL (ref 0.35–4.50)

## 2016-01-16 ENCOUNTER — Other Ambulatory Visit (INDEPENDENT_AMBULATORY_CARE_PROVIDER_SITE_OTHER): Payer: Medicare Other

## 2016-01-16 ENCOUNTER — Other Ambulatory Visit: Payer: Self-pay | Admitting: Internal Medicine

## 2016-01-16 DIAGNOSIS — E7800 Pure hypercholesterolemia, unspecified: Secondary | ICD-10-CM

## 2016-01-16 LAB — HEPATIC FUNCTION PANEL
ALBUMIN: 4.3 g/dL (ref 3.5–5.2)
ALK PHOS: 66 U/L (ref 39–117)
ALT: 15 U/L (ref 0–35)
AST: 20 U/L (ref 0–37)
Bilirubin, Direct: 0.1 mg/dL (ref 0.0–0.3)
Total Bilirubin: 0.5 mg/dL (ref 0.2–1.2)
Total Protein: 7.1 g/dL (ref 6.0–8.3)

## 2016-01-16 NOTE — Progress Notes (Signed)
Order placed for add on liver panel. 

## 2016-02-02 DIAGNOSIS — L255 Unspecified contact dermatitis due to plants, except food: Secondary | ICD-10-CM | POA: Diagnosis not present

## 2016-02-04 ENCOUNTER — Other Ambulatory Visit: Payer: Self-pay | Admitting: Internal Medicine

## 2016-02-06 ENCOUNTER — Other Ambulatory Visit: Payer: Self-pay | Admitting: Internal Medicine

## 2016-02-07 ENCOUNTER — Telehealth (HOSPITAL_COMMUNITY): Payer: Self-pay

## 2016-02-07 NOTE — Telephone Encounter (Signed)
Called to schedule f/u mri, no answer, no vm. AW 

## 2016-02-11 ENCOUNTER — Other Ambulatory Visit: Payer: Self-pay | Admitting: Internal Medicine

## 2016-02-12 NOTE — Telephone Encounter (Signed)
Okay to refill? Last filled on: 01/14/16 #30 and no refills.  Last OV: 12/24/15 Next OV: 04/28/16

## 2016-02-12 NOTE — Telephone Encounter (Signed)
Rx faxed by [facility]

## 2016-02-14 ENCOUNTER — Telehealth (HOSPITAL_COMMUNITY): Payer: Self-pay

## 2016-02-14 NOTE — Telephone Encounter (Signed)
Called to schedule f/u, no answer, no vm. AW 

## 2016-03-04 ENCOUNTER — Other Ambulatory Visit: Payer: Self-pay | Admitting: Internal Medicine

## 2016-03-21 ENCOUNTER — Other Ambulatory Visit: Payer: Self-pay | Admitting: Internal Medicine

## 2016-03-21 NOTE — Telephone Encounter (Signed)
Last office visit 12/24/15 Next office visit 05/02/16

## 2016-03-25 ENCOUNTER — Other Ambulatory Visit: Payer: Self-pay | Admitting: Internal Medicine

## 2016-03-26 ENCOUNTER — Other Ambulatory Visit: Payer: Self-pay | Admitting: Internal Medicine

## 2016-04-04 ENCOUNTER — Telehealth (HOSPITAL_COMMUNITY): Payer: Self-pay

## 2016-04-04 NOTE — Telephone Encounter (Signed)
Called to schedule f/u mri, left vm. AW 

## 2016-04-08 ENCOUNTER — Other Ambulatory Visit (HOSPITAL_COMMUNITY): Payer: Self-pay | Admitting: Interventional Radiology

## 2016-04-08 DIAGNOSIS — I771 Stricture of artery: Secondary | ICD-10-CM

## 2016-04-17 ENCOUNTER — Telehealth: Payer: Self-pay | Admitting: Internal Medicine

## 2016-04-17 NOTE — Telephone Encounter (Signed)
Last filled 03/23/16 30 0rf last OV 12/24/2015

## 2016-04-18 NOTE — Telephone Encounter (Signed)
Faxed to pharmacy called pt and let her know l/m on voice

## 2016-04-18 NOTE — Telephone Encounter (Signed)
Pt called very upset and stated that pharmacy needs a hard copy of the rx for this medication faxed to them. Pt is going out of town this weekend and has no more left.Please advise, thank you!  [place], [place]

## 2016-04-18 NOTE — Telephone Encounter (Signed)
fyi

## 2016-04-21 ENCOUNTER — Other Ambulatory Visit: Payer: Self-pay | Admitting: Internal Medicine

## 2016-04-21 ENCOUNTER — Ambulatory Visit (HOSPITAL_COMMUNITY)
Admission: RE | Admit: 2016-04-21 | Discharge: 2016-04-21 | Disposition: A | Discharge status: Home / Self Care | Payer: Medicare Other | Source: Ambulatory Visit | Attending: Interventional Radiology | Admitting: Interventional Radiology

## 2016-04-21 DIAGNOSIS — I669 Occlusion and stenosis of unspecified cerebral artery: Secondary | ICD-10-CM | POA: Diagnosis not present

## 2016-04-21 DIAGNOSIS — G319 Degenerative disease of nervous system, unspecified: Secondary | ICD-10-CM | POA: Diagnosis not present

## 2016-04-21 DIAGNOSIS — I6521 Occlusion and stenosis of right carotid artery: Secondary | ICD-10-CM | POA: Insufficient documentation

## 2016-04-21 DIAGNOSIS — I771 Stricture of artery: Secondary | ICD-10-CM

## 2016-04-21 DIAGNOSIS — I6622 Occlusion and stenosis of left posterior cerebral artery: Secondary | ICD-10-CM | POA: Diagnosis not present

## 2016-04-21 LAB — CREATININE, SERUM
CREATININE: 0.84 mg/dL (ref 0.44–1.00)
GFR calc non Af Amer: >60 mL/min (ref >60)

## 2016-04-21 MED ORDER — GADOBENATE DIMEGLUMINE 529 MG/ML IV SOLN
15.0000 mL | Freq: Once | INTRAVENOUS | Status: AC | PRN
  Administered 2016-04-21: 15 mL via INTRAVENOUS

## 2016-04-28 ENCOUNTER — Ambulatory Visit: Payer: Medicare Other | Admitting: Internal Medicine

## 2016-05-02 ENCOUNTER — Ambulatory Visit (INDEPENDENT_AMBULATORY_CARE_PROVIDER_SITE_OTHER): Payer: Medicare Other | Admitting: Internal Medicine

## 2016-05-02 ENCOUNTER — Encounter: Payer: Self-pay | Admitting: Internal Medicine

## 2016-05-02 DIAGNOSIS — E7800 Pure hypercholesterolemia, unspecified: Secondary | ICD-10-CM | POA: Diagnosis not present

## 2016-05-02 DIAGNOSIS — I10 Essential (primary) hypertension: Secondary | ICD-10-CM | POA: Diagnosis not present

## 2016-05-02 DIAGNOSIS — F419 Anxiety disorder, unspecified: Secondary | ICD-10-CM | POA: Diagnosis not present

## 2016-05-02 DIAGNOSIS — I6522 Occlusion and stenosis of left carotid artery: Secondary | ICD-10-CM | POA: Diagnosis not present

## 2016-05-02 DIAGNOSIS — M353 Polymyalgia rheumatica: Secondary | ICD-10-CM

## 2016-05-02 NOTE — Progress Notes (Signed)
Pre-visit discussion using our clinic review tool. No additional management support is needed unless otherwise documented below in the visit note.  

## 2016-05-02 NOTE — Progress Notes (Signed)
Patient ID: Kenzli Cornella, female   DOB: [birth year], 81 y.o.   MRN: [id number]   Subjective:    Patient ID: [name], female    DOB: Sep 08, 1935, 81 y.o.   MRN: [id number]  HPI  Patient here for a scheduled follow up.  She feels she is doing better.  Feels better.  Handling stress better.  No chest pain.  Stays active.  Breathing stable.  No acid reflux.  No abdominal pain or cramping.  Bowels stable.  States blood pressure doing well.     Past Medical History:  Diagnosis Date  . Arthritis   . History of chicken pox   . History of kidney stones October 2013  . Hx: UTI (urinary tract infection)   . Hyperlipidemia    Past Surgical History:  Procedure Laterality Date  . BREAST BIOPSY Right 1995  . PARTIAL HYSTERECTOMY  1972  . TONSILLECTOMY AND ADENOIDECTOMY  1945   Family History  Problem Relation Age of Onset  . Heart disease Father    Social History   Social History  . Marital status: Married    Spouse name: N/A  . Number of children: N/A  . Years of education: N/A   Social History Main Topics  . Smoking status: Never Smoker  . Smokeless tobacco: Never Used  . Alcohol use No  . Drug use: No  . Sexual activity: Not Asked   Other Topics Concern  . None   Social History Narrative  . None    Outpatient Encounter Prescriptions as of 05/02/2016  Medication Sig  . ALPRAZolam (XANAX) 0.5 MG tablet TAKE ONE TABLET AT BED- TIME AS NEEDED FOR ANXIETY.  Marland Kitchen amLODipine (NORVASC) 5 MG tablet Take 1 tablet (5 mg total) by mouth daily.  Marland Kitchen atenolol (TENORMIN) 25 MG tablet Take 1 tablet (25 mg total) by mouth daily.  . clopidogrel (PLAVIX) 75 MG tablet Take 1 tablet (75 mg total) by mouth daily.  . folic acid (FOLVITE) [id number] MCG tablet Take 800 mcg by mouth daily.  . methotrexate (RHEUMATREX) 2.5 MG tablet Take 6 tablets per week. (Patient taking differently: Take 8 tablets per week.)  . PARoxetine (PAXIL) 20 MG tablet Take 1 tablet (20 mg total) by mouth daily.  . rosuvastatin (CRESTOR) 5  MG tablet Take 1 tablet (5 mg total) by mouth daily.  . vitamin C (ASCORBIC ACID) 500 MG tablet Take 500 mg by mouth daily.    No facility-administered encounter medications on file as of 05/02/2016.     Review of Systems  Constitutional: Negative for appetite change and unexpected weight change.  HENT: Negative for congestion and sinus pressure.   Respiratory: Negative for cough, chest tightness and shortness of breath.   Cardiovascular: Negative for chest pain, palpitations and leg swelling.  Gastrointestinal: Negative for abdominal pain, diarrhea, nausea and vomiting.  Genitourinary: Negative for difficulty urinating and dysuria.  Musculoskeletal: Negative for back pain and joint swelling.  Skin: Negative for color change and rash.  Neurological: Negative for dizziness, light-headedness and headaches.  Psychiatric/Behavioral: Negative for agitation and dysphoric mood.       Objective:    Physical Exam  Constitutional: She appears well-developed and well-nourished. No distress.  HENT:  Nose: Nose normal.  Mouth/Throat: Oropharynx is clear and moist.  Neck: Neck supple. No thyromegaly present.  Cardiovascular: Normal rate and regular rhythm.   Pulmonary/Chest: Breath sounds normal. No respiratory distress. She has no wheezes.  Abdominal: Soft. Bowel sounds are normal. There is no tenderness.  Musculoskeletal:  She exhibits no edema or tenderness.  Lymphadenopathy:    She has no cervical adenopathy.  Skin: No rash noted. No erythema.  Psychiatric: She has a normal mood and affect. Her behavior is normal.    BP 124/68 (BP Location: Left Arm, Patient Position: Sitting, Cuff Size: Large)   Pulse 64   Temp 98.6 F (37 C) (Oral)   Resp 16   Ht 5\' 5"  (1.651 m)   Wt 140 lb (63.5 kg)   SpO2 96%   BMI 23.30 kg/m  Wt Readings from Last 3 Encounters:  05/02/16 140 lb (63.5 kg)  12/24/15 137 lb 6.4 oz (62.3 kg)  09/07/15 137 lb 6 oz (62.3 kg)     Lab Results  Component Value  Date   WBC 7.0 08/14/2015   HGB 13.9 08/14/2015   HCT 42.1 08/14/2015   PLT 323 08/14/2015   GLUCOSE 92 01/15/2016   CHOL 191 01/15/2016   TRIG 124.0 01/15/2016   HDL 48.20 01/15/2016   LDLDIRECT 137.0 10/17/2014   LDLCALC 118 (H) 01/15/2016   ALT 15 01/16/2016   AST 20 01/16/2016   NA 140 01/15/2016   K 4.3 01/15/2016   CL 105 01/15/2016   CREATININE 0.84 04/21/2016   BUN 21 01/15/2016   CO2 29 01/15/2016   TSH 2.74 01/15/2016   INR 1.10 08/14/2015    Mr Jodene Nam Head Wo Contrast  Result Date: 04/23/2016 CLINICAL DATA:  81 year old hypertensive female with intracranial stenosis. Subsequent encounter. EXAM: MRI HEAD WITHOUT AND WITH CONTRAST MRA HEAD WITHOUT CONTRAST TECHNIQUE: Multiplanar, multiecho pulse sequences of the brain and surrounding structures were obtained without and with intravenous contrast. Angiographic images of the head were obtained using MRA technique without contrast. CONTRAST:  68mL MULTIHANCE GADOBENATE DIMEGLUMINE 529 MG/ML IV SOLN COMPARISON:  Flattening 18 2017 MR and MR angiogram. 07/20/2015 CT angiogram. 08/14/2015 cerebral arteriogram. FINDINGS: MRI HEAD FINDINGS Brain: No acute infarct or intracranial hemorrhage. 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 No intracranial enhancing lesion. Global atrophy without hydrocephalus. Vascular: Major intracranial vascular structures are patent. Skull and upper cervical spine: No worrisome abnormality. Sinuses/Orbits: No acute orbital abnormality. Minimal to mild mucosal thickening ethmoid sinus air cells and maxillary sinuses. Other: Negative MRA HEAD FINDINGS Moderate narrowing A1 segment left anterior cerebral artery without change. Mild narrowing right internal carotid artery cavernous segment. Decrease number of visualized right middle cerebral artery branches. Mild irregularity and narrowing of portions of the middle cerebral artery branches bilaterally. No significant stenosis of the distal vertebral  arteries. Nonvisualized left posterior inferior cerebellar artery. Mild narrowing mid aspect basilar artery. Nonvisualized anterior inferior cerebellar arteries. Marked stenosis P1/P2 junction posterior cerebral artery bilaterally. Posterior cerebral artery distal branch vessel mild narrowing and irregularity greater on the right. No aneurysm noted. IMPRESSION: MRI HEAD No acute infarct or intracranial hemorrhage. 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 Global atrophy. Minimal to mild mucosal thickening ethmoid sinus air cells and maxillary sinuses. MRA HEAD Moderate narrowing A1 segment left anterior cerebral artery without change. Mild narrowing right internal carotid artery cavernous segment. Decrease number of visualized right middle cerebral artery branches. Mild irregularity and narrowing of portions of the middle cerebral artery branches bilaterally. Nonvisualized left posterior inferior cerebellar artery. Mild narrowing mid aspect basilar artery. Nonvisualized anterior inferior cerebellar arteries. Marked stenosis P1/P2 junction posterior cerebral artery bilaterally. Electronically Signed   By: Genia Del M.D.   On: 04/23/2016 06:15   Mr Jeri Cos [id number] Contrast  Result Date: 04/23/2016 CLINICAL DATA:  81 year old hypertensive female with intracranial stenosis. Subsequent encounter. EXAM: MRI HEAD WITHOUT AND WITH CONTRAST MRA HEAD WITHOUT CONTRAST TECHNIQUE: Multiplanar, multiecho pulse sequences of the brain and surrounding structures were obtained without and with intravenous contrast. Angiographic images of the head were obtained using MRA technique without contrast. CONTRAST:  70mL MULTIHANCE GADOBENATE DIMEGLUMINE 529 MG/ML IV SOLN COMPARISON:  Flattening 18 2017 MR and MR angiogram. 07/20/2015 CT angiogram. 08/14/2015 cerebral arteriogram. FINDINGS: MRI HEAD FINDINGS Brain: No acute infarct or intracranial hemorrhage. 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 No  intracranial enhancing lesion. Global atrophy without hydrocephalus. Vascular: Major intracranial vascular structures are patent. Skull and upper cervical spine: No worrisome abnormality. Sinuses/Orbits: No acute orbital abnormality. Minimal to mild mucosal thickening ethmoid sinus air cells and maxillary sinuses. Other: Negative MRA HEAD FINDINGS Moderate narrowing A1 segment left anterior cerebral artery without change. Mild narrowing right internal carotid artery cavernous segment. Decrease number of visualized right middle cerebral artery branches. Mild irregularity and narrowing of portions of the middle cerebral artery branches bilaterally. No significant stenosis of the distal vertebral arteries. Nonvisualized left posterior inferior cerebellar artery. Mild narrowing mid aspect basilar artery. Nonvisualized anterior inferior cerebellar arteries. Marked stenosis P1/P2 junction posterior cerebral artery bilaterally. Posterior cerebral artery distal branch vessel mild narrowing and irregularity greater on the right. No aneurysm noted. IMPRESSION: MRI HEAD No acute infarct or intracranial hemorrhage. 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 Global atrophy. Minimal to mild mucosal thickening ethmoid sinus air cells and maxillary sinuses. MRA HEAD Moderate narrowing A1 segment left anterior cerebral artery without change. Mild narrowing right internal carotid artery cavernous segment. Decrease number of visualized right middle cerebral artery branches. Mild irregularity and narrowing of portions of the middle cerebral artery branches bilaterally. Nonvisualized left posterior inferior cerebellar artery. Mild narrowing mid aspect basilar artery. Nonvisualized anterior inferior cerebellar arteries. Marked stenosis P1/P2 junction posterior cerebral artery bilaterally. Electronically Signed   By: Genia Del M.D.   On: 04/23/2016 06:15       Assessment & Plan:   Problem List Items Addressed This  Visit    Anxiety    Doing well on current regimen.  Follow.       Carotid stenosis    Angiography as outlined.  Just had f/u MRI/MRA.  Continue f/u with vascular surgery.  Continue aggressive risk factor modification.        Essential hypertension    Blood pressure under good control.  Continue same medication regimen.  Follow pressures.  Follow metabolic panel.        Relevant Orders   Basic metabolic panel   Hypercholesterolemia    On crestor.  Tolerating.  Some mild aching.  Hold on increasing the dose.  Follow lipid panel and liver function tests.        Relevant Orders   Hepatic function panel   Lipid panel   PMR (polymyalgia rheumatica) (HCC)    On MTX.  Followed by Dr Jefm Bryant.           [name], MD

## 2016-05-03 ENCOUNTER — Encounter: Payer: Self-pay | Admitting: Internal Medicine

## 2016-05-03 NOTE — Assessment & Plan Note (Signed)
On MTX.  Followed by Dr Jefm Bryant.

## 2016-05-03 NOTE — Assessment & Plan Note (Signed)
Angiography as outlined.  Just had f/u MRI/MRA.  Continue f/u with vascular surgery.  Continue aggressive risk factor modification.

## 2016-05-03 NOTE — Assessment & Plan Note (Signed)
Doing well on current regimen.  Follow.   

## 2016-05-03 NOTE — Assessment & Plan Note (Signed)
On crestor.  Tolerating.  Some mild aching.  Hold on increasing the dose.  Follow lipid panel and liver function tests.

## 2016-05-03 NOTE — Assessment & Plan Note (Signed)
Blood pressure under good control.  Continue same medication regimen.  Follow pressures.  Follow metabolic panel.   

## 2016-05-06 ENCOUNTER — Other Ambulatory Visit: Payer: Self-pay | Admitting: Internal Medicine

## 2016-05-15 ENCOUNTER — Other Ambulatory Visit: Payer: Self-pay | Admitting: Internal Medicine

## 2016-05-15 NOTE — Telephone Encounter (Signed)
Medication: Xanax 0.5mg  Directions: 1 po qhs   Last given: 02/15/17 #30 Number refills: 0 Last o/v: 02-16-16 Follow up: 09/22/16 Labs: 01/15/16

## 2016-05-16 NOTE — Telephone Encounter (Signed)
Script faxed to pharmacy

## 2016-05-19 ENCOUNTER — Other Ambulatory Visit: Payer: Self-pay | Admitting: Internal Medicine

## 2016-05-23 ENCOUNTER — Other Ambulatory Visit: Payer: Self-pay | Admitting: Internal Medicine

## 2016-06-02 ENCOUNTER — Other Ambulatory Visit: Payer: Self-pay | Admitting: Internal Medicine

## 2016-06-12 ENCOUNTER — Telehealth (HOSPITAL_COMMUNITY): Payer: Self-pay

## 2016-06-12 ENCOUNTER — Other Ambulatory Visit: Payer: Self-pay | Admitting: Internal Medicine

## 2016-06-12 NOTE — Telephone Encounter (Signed)
Called to schedule angio, left message for pt to return call. AW 

## 2016-06-12 NOTE — Telephone Encounter (Signed)
Medication: Xanax 0.5mg  Directions: 1 po qhs #30 Last given: 05-16-16 Number refills: 0 Last o/v: 05-02-16 Follow up: 09-22-16

## 2016-06-13 ENCOUNTER — Other Ambulatory Visit: Payer: Self-pay | Admitting: Internal Medicine

## 2016-06-13 NOTE — Telephone Encounter (Signed)
ok'd xanax rx for alprazolam #30 with one refill.

## 2016-06-13 NOTE — Telephone Encounter (Signed)
Faxed

## 2016-06-16 ENCOUNTER — Other Ambulatory Visit: Payer: Self-pay | Admitting: Internal Medicine

## 2016-06-16 NOTE — Telephone Encounter (Signed)
Last refilled on 2/15, please advise, thanks

## 2016-06-27 ENCOUNTER — Other Ambulatory Visit (INDEPENDENT_AMBULATORY_CARE_PROVIDER_SITE_OTHER): Payer: Medicare Other

## 2016-06-27 DIAGNOSIS — I10 Essential (primary) hypertension: Secondary | ICD-10-CM

## 2016-06-27 DIAGNOSIS — E7800 Pure hypercholesterolemia, unspecified: Secondary | ICD-10-CM

## 2016-06-27 LAB — LIPID PANEL
Cholesterol: 174 mg/dL (ref 0–200)
HDL: 51.4 mg/dL (ref >39.00)
LDL CALC: 101 mg/dL — AB (ref 0–99)
NonHDL: 123
TRIGLYCERIDES: 112 mg/dL (ref 0.0–149.0)
Total CHOL/HDL Ratio: 3
VLDL: 22.4 mg/dL (ref 0.0–40.0)

## 2016-06-27 LAB — BASIC METABOLIC PANEL
BUN: 18 mg/dL (ref 6–23)
CHLORIDE: 105 meq/L (ref 96–112)
CO2: 30 mEq/L (ref 19–32)
CREATININE: 0.86 mg/dL (ref 0.40–1.20)
Calcium: 9.9 mg/dL (ref 8.4–10.5)
GFR: 67.68 mL/min (ref >60.00)
GLUCOSE: 103 mg/dL — AB (ref 70–99)
POTASSIUM: 4.6 meq/L (ref 3.5–5.1)
Sodium: 141 mEq/L (ref 135–145)

## 2016-06-27 LAB — HEPATIC FUNCTION PANEL
ALT: 24 U/L (ref 0–35)
AST: 29 U/L (ref 0–37)
Albumin: 4.4 g/dL (ref 3.5–5.2)
Alkaline Phosphatase: 66 U/L (ref 39–117)
Bilirubin, Direct: 0.1 mg/dL (ref 0.0–0.3)
Total Bilirubin: 0.5 mg/dL (ref 0.2–1.2)
Total Protein: 7.2 g/dL (ref 6.0–8.3)

## 2016-06-30 ENCOUNTER — Other Ambulatory Visit: Payer: Self-pay | Admitting: Family Medicine

## 2016-06-30 ENCOUNTER — Telehealth: Payer: Self-pay

## 2016-06-30 NOTE — Telephone Encounter (Signed)
Dr.Scott patient 

## 2016-06-30 NOTE — Telephone Encounter (Signed)
Left message to return call to our office.  

## 2016-06-30 NOTE — Telephone Encounter (Signed)
-----   Message from [name], MD sent at 06/29/2016 10:25 PM EDT ----- Notify pt that her cholesterol levels have improved.  Kidney function tests and liver function tests are wnl.

## 2016-07-01 NOTE — Telephone Encounter (Signed)
Pt called back returning your call. Please advise, thank you! °

## 2016-07-01 NOTE — Telephone Encounter (Signed)
Patient advised of below and verbalized understanding.  

## 2016-07-09 ENCOUNTER — Telehealth (HOSPITAL_COMMUNITY): Payer: Self-pay | Admitting: Radiology

## 2016-07-09 NOTE — Telephone Encounter (Signed)
Called pt's daughter, left VM for her to call us back to schedule cerebral angiogram for her mother. Told her on the message that Deveshwar recommends doing an angiogram first and then treating at a later date and not doing angiogram with intent to treat at the same visit. JM

## 2016-07-10 ENCOUNTER — Other Ambulatory Visit (HOSPITAL_COMMUNITY): Payer: Self-pay | Admitting: Interventional Radiology

## 2016-07-10 DIAGNOSIS — I771 Stricture of artery: Secondary | ICD-10-CM

## 2016-07-16 ENCOUNTER — Other Ambulatory Visit: Payer: Self-pay | Admitting: Internal Medicine

## 2016-07-17 ENCOUNTER — Other Ambulatory Visit: Payer: Self-pay | Admitting: Radiology

## 2016-07-17 ENCOUNTER — Other Ambulatory Visit: Payer: Self-pay | Admitting: Physician Assistant

## 2016-07-18 ENCOUNTER — Encounter (HOSPITAL_COMMUNITY): Payer: Self-pay

## 2016-07-18 ENCOUNTER — Ambulatory Visit (HOSPITAL_COMMUNITY)
Admission: RE | Admit: 2016-07-18 | Discharge: 2016-07-18 | Disposition: A | Discharge status: Home / Self Care | Payer: Medicare Other | Source: Ambulatory Visit | Attending: Interventional Radiology | Admitting: Interventional Radiology

## 2016-07-18 ENCOUNTER — Other Ambulatory Visit (HOSPITAL_COMMUNITY): Payer: Self-pay | Admitting: Interventional Radiology

## 2016-07-18 DIAGNOSIS — I6622 Occlusion and stenosis of left posterior cerebral artery: Secondary | ICD-10-CM | POA: Diagnosis not present

## 2016-07-18 DIAGNOSIS — I708 Atherosclerosis of other arteries: Secondary | ICD-10-CM | POA: Diagnosis not present

## 2016-07-18 DIAGNOSIS — Z7982 Long term (current) use of aspirin: Secondary | ICD-10-CM | POA: Diagnosis not present

## 2016-07-18 DIAGNOSIS — Z7902 Long term (current) use of antithrombotics/antiplatelets: Secondary | ICD-10-CM | POA: Diagnosis not present

## 2016-07-18 DIAGNOSIS — I771 Stricture of artery: Secondary | ICD-10-CM

## 2016-07-18 DIAGNOSIS — I6612 Occlusion and stenosis of left anterior cerebral artery: Secondary | ICD-10-CM | POA: Diagnosis not present

## 2016-07-18 DIAGNOSIS — M1990 Unspecified osteoarthritis, unspecified site: Secondary | ICD-10-CM | POA: Insufficient documentation

## 2016-07-18 DIAGNOSIS — Z8673 Personal history of transient ischemic attack (TIA), and cerebral infarction without residual deficits: Secondary | ICD-10-CM | POA: Insufficient documentation

## 2016-07-18 DIAGNOSIS — I6521 Occlusion and stenosis of right carotid artery: Secondary | ICD-10-CM | POA: Insufficient documentation

## 2016-07-18 DIAGNOSIS — H93A9 Pulsatile tinnitus, unspecified ear: Secondary | ICD-10-CM | POA: Diagnosis not present

## 2016-07-18 DIAGNOSIS — I6602 Occlusion and stenosis of left middle cerebral artery: Secondary | ICD-10-CM | POA: Insufficient documentation

## 2016-07-18 DIAGNOSIS — E785 Hyperlipidemia, unspecified: Secondary | ICD-10-CM | POA: Diagnosis not present

## 2016-07-18 HISTORY — PX: IR ANGIO INTRA EXTRACRAN SEL COM CAROTID INNOMINATE BILAT MOD SED: IMG5360

## 2016-07-18 HISTORY — PX: IR ANGIO VERTEBRAL SEL VERTEBRAL BILAT MOD SED: IMG5369

## 2016-07-18 LAB — PROTIME-INR
INR: 1.07
PROTHROMBIN TIME: 13.9 s (ref 11.4–15.2)

## 2016-07-18 LAB — BASIC METABOLIC PANEL
Anion gap: 8 (ref 5–15)
BUN: 15 mg/dL (ref 6–20)
CHLORIDE: 107 mmol/L (ref 101–111)
CO2: 26 mmol/L (ref 22–32)
Calcium: 9.3 mg/dL (ref 8.9–10.3)
Creatinine, Ser: 0.8 mg/dL (ref 0.44–1.00)
Glucose, Bld: 93 mg/dL (ref 65–99)
POTASSIUM: 3.8 mmol/L (ref 3.5–5.1)
SODIUM: 141 mmol/L (ref 135–145)

## 2016-07-18 LAB — CBC
HCT: 42.1 % (ref 36.0–46.0)
HEMOGLOBIN: 13.4 g/dL (ref 12.0–15.0)
MCH: 29 pg (ref 26.0–34.0)
MCHC: 31.8 g/dL (ref 30.0–36.0)
MCV: 91.1 fL (ref 78.0–100.0)
PLATELETS: 320 10*3/uL (ref 150–400)
RBC: 4.62 MIL/uL (ref 3.87–5.11)
RDW: 15.2 % (ref 11.5–15.5)
WBC: 7.8 10*3/uL (ref 4.0–10.5)

## 2016-07-18 MED ORDER — MIDAZOLAM HCL 2 MG/2ML IJ SOLN
INTRAMUSCULAR | Status: AC | PRN
  Administered 2016-07-18: 1 mg via INTRAVENOUS

## 2016-07-18 MED ORDER — FENTANYL CITRATE (PF) 100 MCG/2ML IJ SOLN
INTRAMUSCULAR | Status: AC | PRN
  Administered 2016-07-18: 25 ug via INTRAVENOUS

## 2016-07-18 MED ORDER — HEPARIN SODIUM (PORCINE) 1000 UNIT/ML IJ SOLN
INTRAMUSCULAR | Status: AC
  Filled 2016-07-18: qty 1

## 2016-07-18 MED ORDER — HEPARIN SODIUM (PORCINE) 1000 UNIT/ML IJ SOLN
INTRAMUSCULAR | Status: AC | PRN
  Administered 2016-07-18: 500 [IU] via INTRAVENOUS
  Administered 2016-07-18: 1000 [IU] via INTRAVENOUS

## 2016-07-18 MED ORDER — SODIUM CHLORIDE 0.9 % IV SOLN
INTRAVENOUS | Status: DC
  Administered 2016-07-18: 09:00:00 via INTRAVENOUS

## 2016-07-18 MED ORDER — IOPAMIDOL (ISOVUE-300) INJECTION 61%
INTRAVENOUS | Status: AC
  Administered 2016-07-18: 80 mL
  Filled 2016-07-18: qty 150

## 2016-07-18 MED ORDER — IOPAMIDOL (ISOVUE-300) INJECTION 61%
INTRAVENOUS | Status: AC
  Filled 2016-07-18: qty 50

## 2016-07-18 MED ORDER — LIDOCAINE HCL 1 % IJ SOLN
INTRAMUSCULAR | Status: AC
  Filled 2016-07-18: qty 20

## 2016-07-18 MED ORDER — SODIUM CHLORIDE 0.9 % IV SOLN: INTRAVENOUS | Status: AC

## 2016-07-18 MED ORDER — MIDAZOLAM HCL 2 MG/2ML IJ SOLN
INTRAMUSCULAR | Status: AC
  Filled 2016-07-18: qty 2

## 2016-07-18 MED ORDER — LIDOCAINE HCL (PF) 1 % IJ SOLN
INTRAMUSCULAR | Status: AC | PRN
  Administered 2016-07-18: 20 mL

## 2016-07-18 MED ORDER — FENTANYL CITRATE (PF) 100 MCG/2ML IJ SOLN
INTRAMUSCULAR | Status: AC
  Filled 2016-07-18: qty 2

## 2016-07-18 NOTE — Procedures (Signed)
S/P 4 vessel cerebral arteriogram RT CFA approach. Findings. 1.Approx 75 % stenosis of LT ACA prox. 2.approx 75 % stenosis Lt PCA P1seg,and 50 % RT PCA P1 seg. 3.Mild to mod stenosis of Lt MCA ant temp branch.

## 2016-07-18 NOTE — Sedation Documentation (Signed)
Patient is resting comfortably. 

## 2016-07-18 NOTE — H&P (Signed)
Chief Complaint: Patient was seen in consultation today for cerebral arteriogram at the request of Dr Ronda Fairly  Referring Physician(s): Dr Ronda Fairly  Supervising Physician: Luanne Bras  Patient Status: [facility] - Out-pt  History of Present Illness: Beth Espinoza is a 81 y.o. female   Known to NIR Evaluated 08/2015 for R ICA stenosis Had symptoms of left sided numbness/tingling---all resolved Denies any neurologic symptoms now Was placed on Plavix/ASA 2017 Routine follow up MRI/MRA 04/2016: MRA HEAD Moderate narrowing A1 segment left anterior cerebral artery without change. Mild narrowing right internal carotid artery cavernous segment. Decrease number of visualized right middle cerebral artery branches. Mild irregularity and narrowing of portions of the middle cerebral artery branches bilaterally. Nonvisualized left posterior inferior cerebellar artery. Mild narrowing mid aspect basilar artery. Nonvisualized anterior inferior cerebellar arteries. Marked stenosis P1/P2 junction posterior cerebral artery bilaterally.  Now scheduled for further evaluation with cerebral arteriogram  Past Medical History:  Diagnosis Date  . Arthritis   . History of chicken pox   . History of kidney stones October 2013  . Hx: UTI (urinary tract infection)   . Hyperlipidemia     Past Surgical History:  Procedure Laterality Date  . BREAST BIOPSY Right 1995  . PARTIAL HYSTERECTOMY  1972  . TONSILLECTOMY AND ADENOIDECTOMY  1945    Allergies: Pneumovax [pneumococcal polysaccharide vaccine]  Medications: Prior to Admission medications   Medication Sig Start Date End Date Taking? Authorizing Provider  ALPRAZolam (XANAX) 0.5 MG tablet TAKE 1 TABLET AT BEDTIME AS NEEDED FOR ANXIETY. Patient taking differently: TAKE 1 TABLET AT BEDTIME FOR ANXIETY. 06/17/16  Yes [name], MD  amLODipine (NORVASC) 5 MG tablet Take 1 tablet (5 mg total) by mouth daily. 07/02/16  Yes [name], MD    Ascorbic Acid (VITAMIN C) 1000 MG tablet Take 1,000 mg by mouth daily.    Yes [provider]  atenolol (TENORMIN) 25 MG tablet Take 1 tablet (25 mg total) by mouth daily. 03/04/16  Yes [name], MD  clopidogrel (PLAVIX) 75 MG tablet Take 1 tablet (75 mg total) by mouth daily. 06/17/16  Yes [name], MD  folic acid (FOLVITE) 161 MCG tablet Take 800 mcg by mouth daily.   Yes [provider]  methotrexate (RHEUMATREX) 2.5 MG tablet Take 6 tablets per week. Patient taking differently: Take 8 tablets per week. 04/09/14  Yes [name], MD  PARoxetine (PAXIL) 20 MG tablet Take 1 tablet (20 mg total) by mouth daily. 07/16/16  Yes [name], MD  rosuvastatin (CRESTOR) 5 MG tablet Take 1 tablet (5 mg total) by mouth daily. 03/04/16  Yes [name], MD     Family History  Problem Relation Age of Onset  . Heart disease Father     Social History   Social History  . Marital status: Married    Spouse name: N/A  . Number of children: N/A  . Years of education: N/A   Social History Main Topics  . Smoking status: Never Smoker  . Smokeless tobacco: Never Used  . Alcohol use No  . Drug use: No  . Sexual activity: Not Asked   Other Topics Concern  . None   Social History Narrative  . None    Review of Systems: A 12 point ROS discussed and pertinent positives are indicated in the HPI above.  All other systems are negative.  Review of Systems  Constitutional: Negative for activity change, appetite change, fatigue and fever.  HENT: Negative for tinnitus and  trouble swallowing.   Eyes: Negative for photophobia and visual disturbance.  Respiratory: Negative for cough and shortness of breath.   Cardiovascular: Negative for chest pain.  Gastrointestinal: Negative for abdominal pain.  Musculoskeletal: Negative for back pain and gait problem.  Neurological: Negative for dizziness, tremors, seizures, syncope, facial asymmetry, speech difficulty, weakness,  light-headedness, numbness and headaches.  Psychiatric/Behavioral: Negative for behavioral problems and confusion.    Vital Signs: BP (!) 146/77   Pulse (!) 56   Temp 97.6 F (36.4 C)   Resp 18   Ht 5\' 5"  (1.651 m)   Wt 138 lb (62.6 kg)   SpO2 97%   BMI 22.96 kg/m   Physical Exam  Constitutional: She is oriented to person, place, and time. She appears well-nourished.  HENT:  Head: Atraumatic.  Eyes: EOM are normal.  Neck: Neck supple.  Cardiovascular: Normal rate, regular rhythm and normal heart sounds.   Pulmonary/Chest: Effort normal and breath sounds normal.  Abdominal: Soft. Bowel sounds are normal. There is no tenderness.  Musculoskeletal: Normal range of motion.  Neurological: She is alert and oriented to person, place, and time.  Skin: Skin is warm and dry.  Psychiatric: She has a normal mood and affect. Her behavior is normal. Judgment and thought content normal.  Nursing note and vitals reviewed.   Mallampati Score:  MD Evaluation Airway: WNL Heart: WNL Abdomen: WNL Chest/ Lungs: WNL ASA  Classification: 3 Mallampati/Airway Score: One  Imaging: No results found.  Labs:  CBC:  Recent Labs  07/19/15 2225 07/20/15 1021 08/14/15 0845 07/18/16 0826  WBC 8.3 8.1 7.0 7.8  HGB 13.9 13.5 13.9 13.4  HCT 41.9 42.1 42.1 42.1  PLT 272 277 323 320    COAGS:  Recent Labs  08/14/15 0845  INR 1.10  APTT 30    BMP:  Recent Labs  07/19/15 2225 07/20/15 1021  08/14/15 0845 08/28/15 0917 01/15/16 0850 04/21/16 1312 06/27/16 1108  NA 141  --   < > 139 141 140  --  141  K 3.7  --   < > 3.8 4.3 4.3  --  4.6  CL 105  --   < > 108 105 105  --  105  CO2 28  --   < > 25 30 29   --  30  GLUCOSE 116*  --   < > 101* 94 92  --  103*  BUN 22*  --   < > 19 17 21   --  18  CALCIUM 9.6  --   < > 9.8 9.7 9.5  --  9.9  CREATININE 0.84 0.85  < > 0.83 0.86 0.89 0.84 0.86  GFRNONAA >60 >60  --  >60  --   --  >60  --   GFRAA >60 >60  --  >60  --   --  >60  --     < > = values in this interval not displayed.  LIVER FUNCTION TESTS:  Recent Labs  08/28/15 0917 10/08/15 1338 01/16/16 1128 06/27/16 1108  BILITOT 0.5 0.3 0.5 0.5  AST 19 19 20 29   ALT 14 14 15 24   ALKPHOS 65 67 66 66  PROT 7.2 7.1 7.1 7.2  ALBUMIN 4.1 4.2 4.3 4.4    TUMOR MARKERS: No results for input(s): AFPTM, CEA, CA199, CHROMGRNA in the last 8760 hours.  Assessment and Plan:  Hx TIA 07/2015 MRA findings of cerebral artery stenosis Scheduled for cerebral arteriogram Risks and Benefits discussed with the patient including,  but not limited to bleeding, infection, vascular injury, contrast induced renal failure, stroke or even death. All of the patient's questions were answered, patient is agreeable to proceed. Consent signed and in chart.  Thank you for this interesting consult.  I greatly enjoyed meeting Romina Divirgilio and look forward to participating in their care.  A copy of this report was sent to the requesting provider on this date.  Electronically Signed: Lavonia Drafts, PA-C 07/18/2016, 9:41 AM   I spent a total of  30 Minutes   in face to face in clinical consultation, greater than 50% of which was counseling/coordinating care for cerebral arteriogram

## 2016-07-18 NOTE — Sedation Documentation (Signed)
IR tech holding pressure to R groin, exoseal placed

## 2016-07-18 NOTE — Discharge Instructions (Signed)
Cerebral Angiogram, Care After °Refer to this sheet in the next few weeks. These instructions provide you with information on caring for yourself after your procedure. Your health care provider may also give you more specific instructions. Your treatment has been planned according to current medical practices, but problems sometimes occur. Call your health care provider if you have any problems or questions after your procedure. °What can I expect after the procedure? °After your procedure, it is typical to have the following: °· Bruising at the catheter insertion site that usually fades within 1-2 weeks. °· Blood collecting in the tissue (hematoma) that may be painful to the touch. It should usually decrease in size and tenderness within 1-2 weeks. °· A mild headache. ° °Follow these instructions at home: °· Take medicines only as directed by your health care provider. °· You may shower 24-48 hours after the procedure or as directed by your health care provider. Remove the bandage (dressing) and gently wash the site with plain soap and water. Pat the area dry with a clean towel. Do not rub the site, because this may cause bleeding. °· Do not take baths, swim, or use a hot tub until your health care provider approves. °· Check your insertion site every day for redness, swelling, or drainage. °· Do not apply powder or lotion to the site. °· Do not lift over 10 lb (4.5 kg) for 5 days after your procedure or as directed by your health care provider. °· Ask your health care provider when it is okay to: °? Return to work or school. °? Resume usual physical activities or sports. °? Resume sexual activity. °· Do not drive home if you are discharged the same day as the procedure. Have someone else drive you. °· You may drive 24 hours after the procedure unless otherwise instructed by your health care provider. °· Do not operate machinery or power tools for 24 hours after the procedure or as directed by your health care  provider. °· If your procedure was done as an outpatient procedure, which means that you went home the same day as your procedure, a responsible adult should be with you for the first 24 hours after you arrive home. °· Keep all follow-up visits as directed by your health care provider. This is important. °Contact a health care provider if: °· You have a fever. °· You have chills. °· You have increased bleeding from the catheter insertion site. Hold pressure on the site. °Get help right away if: °· You have vision changes or loss of vision. °· You have numbness or weakness on one side of your body. °· You have difficulty talking, or you have slurred speech or cannot speak (aphasia). °· You feel confused or have difficulty remembering. °· You have unusual pain at the catheter insertion site. °· You have redness, warmth, or swelling at the catheter insertion site. °· You have drainage (other than a small amount of blood on the dressing) from the catheter insertion site. °· The catheter insertion site is bleeding, and the bleeding does not stop after 30 minutes of holding steady pressure on the site. °These symptoms may represent a serious problem that is an emergency. Do not wait to see if the symptoms will go away. Get medical help right away. Call your local emergency services (911 in [place] Do not drive yourself to the hospital. °This information is not intended to replace advice given to you by your health care provider. Make sure you discuss any questions   you have with your health care provider. °Document Released: 07/04/2013 Document Revised: 07/26/2015 Document Reviewed: 03/02/2013 °Elsevier Interactive Patient Education © 2017 [employer] ° °

## 2016-07-18 NOTE — Sedation Documentation (Addendum)
Provider at bedside. Giving family results

## 2016-07-22 ENCOUNTER — Encounter (HOSPITAL_COMMUNITY): Payer: Self-pay | Admitting: Interventional Radiology

## 2016-08-21 ENCOUNTER — Other Ambulatory Visit: Payer: Self-pay | Admitting: Internal Medicine

## 2016-08-21 DIAGNOSIS — H2513 Age-related nuclear cataract, bilateral: Secondary | ICD-10-CM | POA: Diagnosis not present

## 2016-08-21 DIAGNOSIS — H35371 Puckering of macula, right eye: Secondary | ICD-10-CM | POA: Diagnosis not present

## 2016-08-26 DIAGNOSIS — M1990 Unspecified osteoarthritis, unspecified site: Secondary | ICD-10-CM | POA: Diagnosis not present

## 2016-08-26 DIAGNOSIS — Z79899 Other long term (current) drug therapy: Secondary | ICD-10-CM | POA: Diagnosis not present

## 2016-08-27 DIAGNOSIS — M353 Polymyalgia rheumatica: Secondary | ICD-10-CM | POA: Diagnosis not present

## 2016-08-27 DIAGNOSIS — M1990 Unspecified osteoarthritis, unspecified site: Secondary | ICD-10-CM | POA: Diagnosis not present

## 2016-08-27 DIAGNOSIS — Z79899 Other long term (current) drug therapy: Secondary | ICD-10-CM | POA: Diagnosis not present

## 2016-09-04 DIAGNOSIS — H35371 Puckering of macula, right eye: Secondary | ICD-10-CM | POA: Diagnosis not present

## 2016-09-04 DIAGNOSIS — H35372 Puckering of macula, left eye: Secondary | ICD-10-CM | POA: Diagnosis not present

## 2016-09-04 DIAGNOSIS — H35373 Puckering of macula, bilateral: Secondary | ICD-10-CM | POA: Diagnosis not present

## 2016-09-04 DIAGNOSIS — Z9889 Other specified postprocedural states: Secondary | ICD-10-CM | POA: Diagnosis not present

## 2016-09-16 ENCOUNTER — Other Ambulatory Visit: Payer: Self-pay | Admitting: Internal Medicine

## 2016-09-18 DIAGNOSIS — H2513 Age-related nuclear cataract, bilateral: Secondary | ICD-10-CM | POA: Diagnosis not present

## 2016-09-18 DIAGNOSIS — H35371 Puckering of macula, right eye: Secondary | ICD-10-CM | POA: Diagnosis not present

## 2016-09-18 DIAGNOSIS — H35372 Puckering of macula, left eye: Secondary | ICD-10-CM | POA: Diagnosis not present

## 2016-09-22 ENCOUNTER — Encounter: Payer: Self-pay | Admitting: Internal Medicine

## 2016-09-22 ENCOUNTER — Ambulatory Visit (INDEPENDENT_AMBULATORY_CARE_PROVIDER_SITE_OTHER): Payer: Medicare Other | Admitting: Internal Medicine

## 2016-09-22 DIAGNOSIS — Z0000 Encounter for general adult medical examination without abnormal findings: Secondary | ICD-10-CM

## 2016-09-22 DIAGNOSIS — I77810 Thoracic aortic ectasia: Secondary | ICD-10-CM

## 2016-09-22 DIAGNOSIS — F419 Anxiety disorder, unspecified: Secondary | ICD-10-CM | POA: Diagnosis not present

## 2016-09-22 DIAGNOSIS — M353 Polymyalgia rheumatica: Secondary | ICD-10-CM | POA: Diagnosis not present

## 2016-09-22 DIAGNOSIS — I10 Essential (primary) hypertension: Secondary | ICD-10-CM

## 2016-09-22 DIAGNOSIS — E7800 Pure hypercholesterolemia, unspecified: Secondary | ICD-10-CM

## 2016-09-22 DIAGNOSIS — I6522 Occlusion and stenosis of left carotid artery: Secondary | ICD-10-CM | POA: Diagnosis not present

## 2016-09-22 NOTE — Progress Notes (Signed)
Pre-visit discussion using our clinic review tool. No additional management support is needed unless otherwise documented below in the visit note.  

## 2016-09-22 NOTE — Progress Notes (Signed)
[name], female   DOB: Jul 29, 1935, 81 y.o.   MRN: [id number]   Subjective:    [name], female    DOB: 11-24-1935, 81 y.o.   MRN: [id number]  HPI  Patient with past history of hypercholesterolemia, PMR anc cerebral artery stenosis.  She comes in today to f/u on these issues as well as for a complete physical exam.  She reports she is doing relatively well.  Seeing Dr Jefm Bryant for PMR.  He recently decreased MTX.  Stable.  Stays active.  No chest pain.  No sob.  No acid reflux.  No abdominal pain.  Bowels moving.  Some increased stress with some family issues.  Discussed with her today.  She feels she is handling things relatively well.  Does not feel she needs anything more at this time.  Recently had f/u angiogram.  Stable.  Recommended f/u in one year.    Past Medical History:  Diagnosis Date  . Arthritis   . History of chicken pox   . History of kidney stones October 2013  . Hx: UTI (urinary tract infection)   . Hyperlipidemia    Past Surgical History:  Procedure Laterality Date  . BREAST BIOPSY Right 1995  . IR ANGIO INTRA EXTRACRAN SEL COM CAROTID INNOMINATE BILAT MOD SED  07/18/2016  . IR ANGIO VERTEBRAL SEL VERTEBRAL BILAT MOD SED  07/18/2016  . PARTIAL HYSTERECTOMY  1972  . TONSILLECTOMY AND ADENOIDECTOMY  1945   Family History  Problem Relation Age of Onset  . Heart disease Father    Social History   Social History  . Marital status: Married    Spouse name: N/A  . Number of children: N/A  . Years of education: N/A   Social History Main Topics  . Smoking status: Never Smoker  . Smokeless tobacco: Never Used  . Alcohol use No  . Drug use: No  . Sexual activity: Not Asked   Other Topics Concern  . None   Social History Narrative  . None    Outpatient Encounter Prescriptions as of 09/22/2016  Medication Sig  . ALPRAZolam (XANAX) 0.5 MG tablet TAKE 1 TABLET AT BEDTIME AS NEEDED FOR ANXIETY.  Marland Kitchen amLODipine (NORVASC) 5 MG tablet Take 1 tablet  (5 mg total) by mouth daily.  . Ascorbic Acid (VITAMIN C) 1000 MG tablet Take 1,000 mg by mouth daily.   Marland Kitchen atenolol (TENORMIN) 25 MG tablet Take 1 tablet (25 mg total) by mouth daily.  . clopidogrel (PLAVIX) 75 MG tablet Take 1 tablet (75 mg total) by mouth daily.  . folic acid (FOLVITE) 765 MCG tablet Take 800 mcg by mouth daily.  . methotrexate (RHEUMATREX) 2.5 MG tablet Take 6 tablets per week. (Patient taking differently: Take 8 tablets per week.)  . rosuvastatin (CRESTOR) 5 MG tablet Take 1 tablet (5 mg total) by mouth daily.  . [DISCONTINUED] PARoxetine (PAXIL) 20 MG tablet Take 1 tablet (20 mg total) by mouth daily.  Marland Kitchen ketorolac (ACULAR) 0.4 % SOLN Apply to eye.  Marland Kitchen ofloxacin (OCUFLOX) 0.3 % ophthalmic solution Apply to eye.  . prednisoLONE acetate (PRED FORTE) 1 % ophthalmic suspension Apply to eye.   No facility-administered encounter medications on file as of 09/22/2016.     Review of Systems  Constitutional: Negative for appetite change and unexpected weight change.  HENT: Negative for congestion and sinus pressure.   Eyes: Negative for pain and visual disturbance.  Respiratory: Negative for cough, chest tightness and shortness of breath.  Cardiovascular: Negative for chest pain, palpitations and leg swelling.  Gastrointestinal: Negative for abdominal pain, diarrhea, nausea and vomiting.  Genitourinary: Negative for difficulty urinating and dysuria.  Musculoskeletal: Negative for back pain and joint swelling.  Skin: Negative for color change and rash.  Neurological: Negative for dizziness, light-headedness and headaches.  Hematological: Negative for adenopathy. Does not bruise/bleed easily.  Psychiatric/Behavioral: Negative for agitation and dysphoric mood.       Objective:    Physical Exam  Constitutional: She is oriented to person, place, and time. She appears well-developed and well-nourished. No distress.  HENT:  Nose: Nose normal.  Mouth/Throat: Oropharynx is clear  and moist.  Eyes: Right eye exhibits no discharge. Left eye exhibits no discharge. No scleral icterus.  Neck: Neck supple. No thyromegaly present.  Cardiovascular: Normal rate and regular rhythm.   Pulmonary/Chest: Breath sounds normal. No accessory muscle usage. No tachypnea. No respiratory distress. She has no decreased breath sounds. She has no wheezes. She has no rhonchi. Right breast exhibits no inverted nipple, no mass, no nipple discharge and no tenderness (no axillary adenopathy). Left breast exhibits no inverted nipple, no mass, no nipple discharge and no tenderness (no axilarry adenopathy).  Abdominal: Soft. Bowel sounds are normal. There is no tenderness.  Musculoskeletal: She exhibits no edema or tenderness.  Lymphadenopathy:    She has no cervical adenopathy.  Neurological: She is alert and oriented to person, place, and time.  Skin: Skin is warm. No rash noted. No erythema.  Psychiatric: She has a normal mood and affect. Her behavior is normal.    BP 108/60 (BP Location: Left Arm, Patient Position: Sitting, Cuff Size: Normal)   Pulse 64   Temp 98.6 F (37 C) (Oral)   Resp 12   Ht 5\' 5"  (1.651 m)   Wt 143 lb (64.9 kg)   SpO2 96%   BMI 23.80 kg/m  Wt Readings from Last 3 Encounters:  09/22/16 143 lb (64.9 kg)  07/18/16 138 lb (62.6 kg)  05/02/16 140 lb (63.5 kg)     Lab Results  Component Value Date   WBC 7.8 07/18/2016   HGB 13.4 07/18/2016   HCT 42.1 07/18/2016   PLT 320 07/18/2016   GLUCOSE 93 07/18/2016   CHOL 174 06/27/2016   TRIG 112.0 06/27/2016   HDL 51.40 06/27/2016   LDLDIRECT 137.0 10/17/2014   LDLCALC 101 (H) 06/27/2016   ALT 24 06/27/2016   AST 29 06/27/2016   NA 141 07/18/2016   K 3.8 07/18/2016   CL 107 07/18/2016   CREATININE 0.80 07/18/2016   BUN 15 07/18/2016   CO2 26 07/18/2016   TSH 2.74 01/15/2016   INR 1.07 07/18/2016    Ir Angio Intra Extracran Sel Com Carotid Innominate Bilat Mod Sed  Result Date: 07/22/2016 CLINICAL DATA:   Episodes of word-finding difficulty. Also history of pulsatile tinnitus not localized. EXAM: BILATERAL COMMON CAROTID AND INNOMINATE ANGIOGRAPHY; IR ANGIO VERTEBRAL SEL VERTEBRAL BILAT MOD SED COMPARISON:  Catheter arteriogram of 08/14/2015. MEDICATIONS: Heparin 1500 units IV; no antibiotic was administered within 1 hour of the procedure ANESTHESIA/SEDATION: Versed 1 mg IV; Fentanyl 25 mcg IV Moderate Sedation Time:  40 minutes. The patient was continuously monitored during the procedure by the interventional radiology nurse under my direct supervision. CONTRAST:  Isovue 300 approximately 65 mL FLUOROSCOPY TIME:  Fluoroscopy Time: 50 minutes 12 seconds ( mGy). COMPLICATIONS: None immediate. TECHNIQUE: Informed written consent was obtained from the patient after a thorough discussion of the procedural risks, benefits and alternatives.  All questions were addressed. Maximal Sterile Barrier Technique was utilized including caps, mask, sterile gowns, sterile gloves, sterile drape, hand hygiene and skin antiseptic. A timeout was performed prior to the initiation of the procedure. The right groin was prepped and draped in the usual sterile fashion. Thereafter using modified Seldinger technique, transfemoral access into the right common femoral artery was obtained without difficulty. Over a 0.035 inch guidewire, a 5 French Pinnacle sheath was inserted. Through this, and also over 0.035 inch guidewire, a 5 French Simmons 2 catheter was advanced to the aortic arch region and selectively positioned in the right common carotid artery, the right vertebral artery, , the left common carotid artery and the left vertebral artery. FINDINGS: The left vertebral artery origin is normal. The vessel is seen to opacify normally to the cranial skull base. Normal opacification is seen of the left posterior-inferior cerebellar artery and the left vertebrobasilar junction. The basilar artery, the superior cerebellar arteries and the  anterior-inferior cerebellar arteries are seen to opacify into the capillary and venous phases. Both posterior cerebral arteries also opacify into the capillary and venous phases. There is a focal severe stenoses of the left posterior cerebral artery at the P1 P2 junction of approximately 75%, and of a lesser degree involving the right posterior cerebral artery P1 segment. Distal flow is noted unimpeded. The right vertebral artery origin is normal. The vessel is seen to opacify to the cranial skull base. Wide patency is seen of the right vertebrobasilar junction and the right posterior-inferior cerebellar artery. The opacified portion of the basilar artery, the posterior cerebral arteries, the superior cerebellar arteries and the anterior-inferior cerebellar arteries is normal into the capillary and venous phases. Non-opacified blood is seen in the basilar artery from the contralateral vertebral artery. The left common carotid arteriogram demonstrates mild stenosis at the origin of the left external carotid artery. Its branches are normally opacified. The left internal carotid artery at the bulb demonstrates partially calcified plaque, however, without intraluminal irregularities or stenosis. The vessel is seen to opacify to the cranial skull base. The petrous, cavernous and the supraclinoid segments are widely patent. Partial opacification via the left posterior communicating artery of the left posterior cerebral artery is noted. The left middle cerebral artery and the left anterior cerebral artery are seen to opacify into the capillary and venous phases. The proximal left anterior cerebral artery A1 segment demonstrates approximately 75% stenoses. Less prominent stenosis is seen involving the proximal anterior temporal branch of the left middle cerebral artery. The right common carotid arteriogram demonstrates approximately 40-50% stenosis at the origin of the right external carotid artery. Its branches are  normally opacified. The right internal carotid artery at the bulb has a smooth shallow plaque without significant stenosis or ulcerations. The vessel is, otherwise, seen to opacify to the cranial skull base. Wide patency is seen of the petrous, the cavernous and the supraclinoid segments. The right posterior communicating artery seen transiently opacifying the right posterior cerebral artery distribution. The right middle cerebral artery and the right anterior cerebral artery opacify into the capillary and venous phases. Mild caliber irregularity of the mid 1/3 of the right pericallosal artery is noted. IMPRESSION: Approximately 75% stenosis of the left anterior cerebral artery A1 segment proximally. Approximately 75% stenosis the left posterior cerebral artery at the P1 P2 junction, and of approximately 50% involving the right posterior cerebral artery P1 segment. Approximately 50% stenosis of the proximal anterior temporal branch of the left middle cerebral artery. Approximately 40-50% stenosis of  the right external carotid artery at its origin. Mild intracranial arterial sclerosis of the mid third of the right pericallosal artery. The angiographic findings were reviewed with the patient and the patient's daughter. The patient was asked to continue with the present regimen of treatment. A follow-up MRI of the brain and MRA of the brain will be scheduled in approximately a year's time. Should the patient's symptoms worsen, the patient has been asked to call or call 911 should she had symptoms of motor weakness, gait instability, visual blindness or double vision, or facial weakness. Both the patient and her daughter leave with good understanding and agreement with the above management plan. Electronically Signed   By: Luanne Bras M.D.   On: 07/18/2016 14:03   Ir Angio Vertebral Sel Vertebral Bilat Mod Sed  Result Date: 07/22/2016 CLINICAL DATA:  Episodes of word-finding difficulty. Also history of  pulsatile tinnitus not localized. EXAM: BILATERAL COMMON CAROTID AND INNOMINATE ANGIOGRAPHY; IR ANGIO VERTEBRAL SEL VERTEBRAL BILAT MOD SED COMPARISON:  Catheter arteriogram of 08/14/2015. MEDICATIONS: Heparin 1500 units IV; no antibiotic was administered within 1 hour of the procedure ANESTHESIA/SEDATION: Versed 1 mg IV; Fentanyl 25 mcg IV Moderate Sedation Time:  40 minutes. The patient was continuously monitored during the procedure by the interventional radiology nurse under my direct supervision. CONTRAST:  Isovue 300 approximately 65 mL FLUOROSCOPY TIME:  Fluoroscopy Time: 50 minutes 12 seconds ( mGy). COMPLICATIONS: None immediate. TECHNIQUE: Informed written consent was obtained from the patient after a thorough discussion of the procedural risks, benefits and alternatives. All questions were addressed. Maximal Sterile Barrier Technique was utilized including caps, mask, sterile gowns, sterile gloves, sterile drape, hand hygiene and skin antiseptic. A timeout was performed prior to the initiation of the procedure. The right groin was prepped and draped in the usual sterile fashion. Thereafter using modified Seldinger technique, transfemoral access into the right common femoral artery was obtained without difficulty. Over a 0.035 inch guidewire, a 5 French Pinnacle sheath was inserted. Through this, and also over 0.035 inch guidewire, a 5 French Simmons 2 catheter was advanced to the aortic arch region and selectively positioned in the right common carotid artery, the right vertebral artery, , the left common carotid artery and the left vertebral artery. FINDINGS: The left vertebral artery origin is normal. The vessel is seen to opacify normally to the cranial skull base. Normal opacification is seen of the left posterior-inferior cerebellar artery and the left vertebrobasilar junction. The basilar artery, the superior cerebellar arteries and the anterior-inferior cerebellar arteries are seen to opacify into  the capillary and venous phases. Both posterior cerebral arteries also opacify into the capillary and venous phases. There is a focal severe stenoses of the left posterior cerebral artery at the P1 P2 junction of approximately 75%, and of a lesser degree involving the right posterior cerebral artery P1 segment. Distal flow is noted unimpeded. The right vertebral artery origin is normal. The vessel is seen to opacify to the cranial skull base. Wide patency is seen of the right vertebrobasilar junction and the right posterior-inferior cerebellar artery. The opacified portion of the basilar artery, the posterior cerebral arteries, the superior cerebellar arteries and the anterior-inferior cerebellar arteries is normal into the capillary and venous phases. Non-opacified blood is seen in the basilar artery from the contralateral vertebral artery. The left common carotid arteriogram demonstrates mild stenosis at the origin of the left external carotid artery. Its branches are normally opacified. The left internal carotid artery at the bulb demonstrates partially  calcified plaque, however, without intraluminal irregularities or stenosis. The vessel is seen to opacify to the cranial skull base. The petrous, cavernous and the supraclinoid segments are widely patent. Partial opacification via the left posterior communicating artery of the left posterior cerebral artery is noted. The left middle cerebral artery and the left anterior cerebral artery are seen to opacify into the capillary and venous phases. The proximal left anterior cerebral artery A1 segment demonstrates approximately 75% stenoses. Less prominent stenosis is seen involving the proximal anterior temporal branch of the left middle cerebral artery. The right common carotid arteriogram demonstrates approximately 40-50% stenosis at the origin of the right external carotid artery. Its branches are normally opacified. The right internal carotid artery at the bulb has  a smooth shallow plaque without significant stenosis or ulcerations. The vessel is, otherwise, seen to opacify to the cranial skull base. Wide patency is seen of the petrous, the cavernous and the supraclinoid segments. The right posterior communicating artery seen transiently opacifying the right posterior cerebral artery distribution. The right middle cerebral artery and the right anterior cerebral artery opacify into the capillary and venous phases. Mild caliber irregularity of the mid 1/3 of the right pericallosal artery is noted. IMPRESSION: Approximately 75% stenosis of the left anterior cerebral artery A1 segment proximally. Approximately 75% stenosis the left posterior cerebral artery at the P1 P2 junction, and of approximately 50% involving the right posterior cerebral artery P1 segment. Approximately 50% stenosis of the proximal anterior temporal branch of the left middle cerebral artery. Approximately 40-50% stenosis of the right external carotid artery at its origin. Mild intracranial arterial sclerosis of the mid third of the right pericallosal artery. The angiographic findings were reviewed with the patient and the patient's daughter. The patient was asked to continue with the present regimen of treatment. A follow-up MRI of the brain and MRA of the brain will be scheduled in approximately a year's time. Should the patient's symptoms worsen, the patient has been asked to call or call 911 should she had symptoms of motor weakness, gait instability, visual blindness or double vision, or facial weakness. Both the patient and her daughter leave with good understanding and agreement with the above management plan. Electronically Signed   By: Luanne Bras M.D.   On: 07/18/2016 14:03       Assessment & Plan:   Problem List Items Addressed This Visit    Anxiety    Increased stress as outlined.  Discussed with her today.  Does not feel needs anything more at this time.  Follow.        Carotid  stenosis    Angiography as outlined.  Felt stable.  Recommended f/u in one year.  Continue plavix.        Ectatic thoracic aorta (HCC)    Found on imaging w/up for TIA.  Recommended f/u imaging in one year.  Just recently evaluated by Dr Estil Daft.        Essential hypertension    Blood pressure under good control.  Continue same medication regimen.  Follow pressures.  Follow metabolic panel.        Relevant Orders   Basic metabolic panel   TSH   Health care maintenance    Physical today 09/22/16.  Declines mammogram.  Had colonoscopy 2014.  Due 2019.        Hypercholesterolemia    On crestor.  Follow lipid panel and liver function tests.   Lab Results  Component Value Date   CHOL 174 06/27/2016  HDL 51.40 06/27/2016   LDLCALC 101 (H) 06/27/2016   LDLDIRECT 137.0 10/17/2014   TRIG 112.0 06/27/2016   CHOLHDL 3 06/27/2016        Relevant Orders   Hepatic function panel   Lipid panel   PMR (polymyalgia rheumatica) (HCC)    On MTX.  Dose decreased recently.  Stable.  Followed by Dr Jefm Bryant.           [name], MD

## 2016-09-22 NOTE — Assessment & Plan Note (Addendum)
Physical today 09/22/16.  Declines mammogram.  Had colonoscopy 2014.  Due 2019.

## 2016-09-23 ENCOUNTER — Other Ambulatory Visit: Payer: Self-pay

## 2016-09-23 MED ORDER — PAROXETINE HCL 20 MG PO TABS: 20.0000 mg | ORAL_TABLET | Freq: Every day | ORAL | 0 refills | Status: DC

## 2016-09-24 ENCOUNTER — Encounter: Payer: Self-pay | Admitting: Internal Medicine

## 2016-09-24 NOTE — Assessment & Plan Note (Signed)
Increased stress as outlined.  Discussed with her today.  Does not feel needs anything more at this time.  Follow.   

## 2016-09-24 NOTE — Assessment & Plan Note (Addendum)
Found on imaging w/up for TIA.  Recommended f/u imaging in one year.  Just recently evaluated by Dr Estil Daft.

## 2016-09-24 NOTE — Assessment & Plan Note (Signed)
Blood pressure under good control.  Continue same medication regimen.  Follow pressures.  Follow metabolic panel.   

## 2016-09-24 NOTE — Assessment & Plan Note (Signed)
On crestor.  Follow lipid panel and liver function tests.   Lab Results  Component Value Date   CHOL 174 06/27/2016   HDL 51.40 06/27/2016   LDLCALC 101 (H) 06/27/2016   LDLDIRECT 137.0 10/17/2014   TRIG 112.0 06/27/2016   CHOLHDL 3 06/27/2016

## 2016-09-24 NOTE — Assessment & Plan Note (Signed)
Angiography as outlined.  Felt stable.  Recommended f/u in one year.  Continue plavix.

## 2016-09-24 NOTE — Assessment & Plan Note (Signed)
On MTX.  Dose decreased recently.  Stable.  Followed by Dr Jefm Bryant.

## 2016-09-29 ENCOUNTER — Other Ambulatory Visit: Payer: Self-pay | Admitting: Internal Medicine

## 2016-10-07 DIAGNOSIS — Z961 Presence of intraocular lens: Secondary | ICD-10-CM | POA: Insufficient documentation

## 2016-10-07 DIAGNOSIS — H52221 Regular astigmatism, right eye: Secondary | ICD-10-CM | POA: Diagnosis not present

## 2016-10-07 DIAGNOSIS — H25811 Combined forms of age-related cataract, right eye: Secondary | ICD-10-CM | POA: Diagnosis not present

## 2016-10-08 ENCOUNTER — Telehealth: Payer: Self-pay | Admitting: Internal Medicine

## 2016-10-08 NOTE — Telephone Encounter (Signed)
Left pt message asking to call Allison back directly at [phone] to schedule AWV. Thanks! ° °*NOTE* Never had AWV before °

## 2016-10-14 NOTE — Telephone Encounter (Signed)
Scheduled 11/24/16

## 2016-10-15 ENCOUNTER — Telehealth: Payer: Self-pay

## 2016-10-15 MED ORDER — ALPRAZOLAM 0.5 MG PO TABS: ORAL_TABLET | ORAL | 0 refills | Status: DC

## 2016-10-15 NOTE — Telephone Encounter (Signed)
ok'd rx for alprazolam #30 with no refills.   

## 2016-10-15 NOTE — Telephone Encounter (Signed)
Alpraxolam 0.5 1 po qhs Prn  Last filled 09/16/16  Last o/v 09/22/16 F/u 01/26/17

## 2016-10-15 NOTE — Telephone Encounter (Signed)
Faxed

## 2016-11-11 ENCOUNTER — Other Ambulatory Visit: Payer: Self-pay | Admitting: Internal Medicine

## 2016-11-11 NOTE — Telephone Encounter (Signed)
Last OV 09/22/2016 Next OV 01/26/2017 Last refill 10/15/2016

## 2016-11-12 ENCOUNTER — Other Ambulatory Visit: Payer: Self-pay | Admitting: Internal Medicine

## 2016-11-12 NOTE — Telephone Encounter (Signed)
Faxed to pharmacy

## 2016-11-16 ENCOUNTER — Other Ambulatory Visit: Payer: Self-pay | Admitting: Internal Medicine

## 2016-11-16 DIAGNOSIS — I712 Thoracic aortic aneurysm, without rupture, unspecified: Secondary | ICD-10-CM

## 2016-11-16 DIAGNOSIS — R9389 Abnormal findings on diagnostic imaging of other specified body structures: Secondary | ICD-10-CM

## 2016-11-16 NOTE — Progress Notes (Signed)
Order placed for CTA f/u abnormal thoracic aortic findings.

## 2016-11-24 ENCOUNTER — Other Ambulatory Visit: Payer: Medicare Other

## 2016-11-24 ENCOUNTER — Ambulatory Visit: Payer: Medicare Other

## 2016-11-27 ENCOUNTER — Telehealth: Payer: Self-pay | Admitting: Internal Medicine

## 2016-11-27 NOTE — Telephone Encounter (Signed)
Left pt message asking to call Ebony Hail back directly at [phone] to reschedule AWV. Thanks!  *NOTE* no hx of AWV

## 2016-11-28 ENCOUNTER — Ambulatory Visit: Admission: RE | Admit: 2016-11-28 | Payer: Medicare Other | Source: Ambulatory Visit

## 2016-12-01 ENCOUNTER — Telehealth: Payer: Self-pay | Admitting: Internal Medicine

## 2016-12-01 NOTE — Telephone Encounter (Signed)
Noted.  Please f/u with pt to make sure she is evaluated.

## 2016-12-01 NOTE — Telephone Encounter (Signed)
Patient says she has been sick with PMR for about 3-4 days. She feels like there is a bicycle pump in her head pumping out her ears. Neck was stiff and hurting all day yesterday but is better now. Patient says she is having horrible night mares. She says her BP is fluctuating 145/85-195/85. Patient denies any other symptoms.

## 2016-12-01 NOTE — Telephone Encounter (Signed)
Please call pt and inform her that if she is having this sensation in her head, etc - she needs to be seen.  I recommend evaluation today and then we can f/u after.  I am not sure if is her PMR.  She sees rheumatology for her PMR.  With her head sensation, needs to go ahead and be evaluated today.  They can do testing to confirm nothing more acute going on.

## 2016-12-01 NOTE — Telephone Encounter (Signed)
Pt called about being so sick with PMR and not able to drive or talk on the phone. Please advise?  Call pt @ [phone]. Thank you!

## 2016-12-01 NOTE — Telephone Encounter (Signed)
Patient agreed with evaluation today and says she will go to urgent care when her husband gets back to drive her.

## 2016-12-02 NOTE — Telephone Encounter (Signed)
Patient was evaluated at [facility] on 12/01/2016.

## 2016-12-03 ENCOUNTER — Other Ambulatory Visit: Payer: Medicare Other

## 2016-12-03 ENCOUNTER — Emergency Department
Admission: EM | Admit: 2016-12-03 | Discharge: 2016-12-03 | Disposition: A | Discharge status: Home / Self Care | Payer: Medicare Other | Attending: Emergency Medicine | Admitting: Emergency Medicine

## 2016-12-03 ENCOUNTER — Encounter: Payer: Self-pay | Admitting: Internal Medicine

## 2016-12-03 DIAGNOSIS — Z5321 Procedure and treatment not carried out due to patient leaving prior to being seen by health care provider: Secondary | ICD-10-CM | POA: Insufficient documentation

## 2016-12-03 DIAGNOSIS — R030 Elevated blood-pressure reading, without diagnosis of hypertension: Secondary | ICD-10-CM | POA: Insufficient documentation

## 2016-12-03 DIAGNOSIS — R51 Headache: Secondary | ICD-10-CM | POA: Diagnosis not present

## 2016-12-03 DIAGNOSIS — R42 Dizziness and giddiness: Secondary | ICD-10-CM | POA: Insufficient documentation

## 2016-12-03 DIAGNOSIS — R1110 Vomiting, unspecified: Secondary | ICD-10-CM | POA: Diagnosis not present

## 2016-12-03 DIAGNOSIS — R531 Weakness: Secondary | ICD-10-CM | POA: Diagnosis not present

## 2016-12-03 LAB — COMPREHENSIVE METABOLIC PANEL
ALBUMIN: 4.6 g/dL (ref 3.5–5.0)
ALT: 35 U/L (ref 14–54)
AST: 43 U/L — AB (ref 15–41)
Alkaline Phosphatase: 66 U/L (ref 38–126)
Anion gap: 12 (ref 5–15)
BILIRUBIN TOTAL: 1.1 mg/dL (ref 0.3–1.2)
BUN: 20 mg/dL (ref 6–20)
CHLORIDE: 105 mmol/L (ref 101–111)
CO2: 22 mmol/L (ref 22–32)
CREATININE: 0.88 mg/dL (ref 0.44–1.00)
Calcium: 10.4 mg/dL — ABNORMAL HIGH (ref 8.9–10.3)
GFR calc Af Amer: >60 mL/min (ref >60)
GFR calc non Af Amer: >60 mL/min (ref >60)
GLUCOSE: 106 mg/dL — AB (ref 65–99)
POTASSIUM: 3.9 mmol/L (ref 3.5–5.1)
Sodium: 139 mmol/L (ref 135–145)
TOTAL PROTEIN: 8.3 g/dL — AB (ref 6.5–8.1)

## 2016-12-03 LAB — LIPASE, BLOOD: Lipase: 31 U/L (ref 11–51)

## 2016-12-03 LAB — CBC
HEMATOCRIT: 45.3 % (ref 35.0–47.0)
Hemoglobin: 15.3 g/dL (ref 12.0–16.0)
MCH: 30.3 pg (ref 26.0–34.0)
MCHC: 33.7 g/dL (ref 32.0–36.0)
MCV: 89.9 fL (ref 80.0–100.0)
Platelets: 381 10*3/uL (ref 150–440)
RBC: 5.04 MIL/uL (ref 3.80–5.20)
RDW: 15.7 % — ABNORMAL HIGH (ref 11.5–14.5)
WBC: 12.3 10*3/uL — ABNORMAL HIGH (ref 3.6–11.0)

## 2016-12-03 NOTE — Telephone Encounter (Signed)
Called and spoke with patient and advised patient she needs to go to the ER now, patient stated she feels a little better and her BP is not as elevated now stated she could  Not repeat to nurse exactly what BP reading was.  Spoke with patient daughter and advised her of PCP recommendations and she said she would try to get her mother to go to the ED.

## 2016-12-03 NOTE — ED Notes (Addendum)
Family to STAT desk to st pt feeling better and leaving now; instr that pt is next to go back to an exam room; family st will inform pt to wait; charge nurse notified

## 2016-12-03 NOTE — Telephone Encounter (Signed)
Thank you.  Agree with evaluation now with her history.  Need to confirm etiology of symptoms.

## 2016-12-03 NOTE — ED Notes (Signed)
Family back to STAT desk to st pt does not wish to wait and leaving now; will come back for new or worsening symptoms

## 2016-12-03 NOTE — ED Triage Notes (Signed)
Pt reports elevated blood pressure at home for several days. Pt reports headache that began this weekend. Pt reports hearing a wooshing sound in both ears bilaterally. Pt reports vomiting that began today. Pt reports feeling dizzy and weak.

## 2016-12-03 NOTE — Telephone Encounter (Signed)
Please call pt (or her daughter) and explain I am not in the office this pm.  If she is having increased headache, vomiting and elevated blood pressure, she needs to be seen at the ER.  With her vascular history, etc - she needs to be seen and monitored (may need scan).  Needs to go to ER now.

## 2016-12-04 ENCOUNTER — Telehealth: Payer: Self-pay | Admitting: Emergency Medicine

## 2016-12-04 ENCOUNTER — Encounter: Payer: Self-pay | Admitting: Internal Medicine

## 2016-12-04 NOTE — Telephone Encounter (Signed)
This is the patient you spoke to yesterday.  She does need to take her medications regularly and not miss.  Sounds like she is doing better.  Can spot check pressure.  Needs a f/u appt with me.  Any acute change or problems, needs to be evaluated.

## 2016-12-04 NOTE — Telephone Encounter (Signed)
Beth Espinoza.  See my note.  I had sent this to you.

## 2016-12-04 NOTE — Telephone Encounter (Signed)
Called patient due to lwot to inquire about condition and follow up plans. Patient did not answer home number.  Called mobile and her daughter answered.  Said she would make sure [facility] pcp knows about her symptoms and that she was not seen by ED provider.

## 2016-12-05 ENCOUNTER — Ambulatory Visit
Admission: RE | Admit: 2016-12-05 | Discharge: 2016-12-05 | Disposition: A | Discharge status: Home / Self Care | Payer: Medicare Other | Source: Ambulatory Visit | Attending: Internal Medicine | Admitting: Internal Medicine

## 2016-12-05 DIAGNOSIS — R9389 Abnormal findings on diagnostic imaging of other specified body structures: Secondary | ICD-10-CM

## 2016-12-05 DIAGNOSIS — R918 Other nonspecific abnormal finding of lung field: Secondary | ICD-10-CM | POA: Diagnosis not present

## 2016-12-05 DIAGNOSIS — K449 Diaphragmatic hernia without obstruction or gangrene: Secondary | ICD-10-CM | POA: Diagnosis not present

## 2016-12-05 DIAGNOSIS — I712 Thoracic aortic aneurysm, without rupture, unspecified: Secondary | ICD-10-CM

## 2016-12-05 HISTORY — DX: Essential (primary) hypertension: I10

## 2016-12-05 MED ORDER — IOPAMIDOL (ISOVUE-370) INJECTION 76%
75.0000 mL | Freq: Once | INTRAVENOUS | Status: AC | PRN
Start: 2016-12-05 — End: 2016-12-05
  Administered 2016-12-05: 75 mL via INTRAVENOUS

## 2016-12-05 NOTE — Telephone Encounter (Signed)
Pt daughter called back again.

## 2016-12-05 NOTE — Telephone Encounter (Signed)
Left patient daughter message to return call to office.

## 2016-12-05 NOTE — Telephone Encounter (Signed)
Pt daughter called back returning your call. Please advise, thank you!  Call @ [phone]

## 2016-12-08 NOTE — Telephone Encounter (Signed)
Patient scheduled follow up 01/26/17.

## 2016-12-09 ENCOUNTER — Other Ambulatory Visit: Payer: Self-pay | Admitting: Internal Medicine

## 2016-12-09 NOTE — Telephone Encounter (Signed)
Faxed to pharmacy

## 2016-12-09 NOTE — Telephone Encounter (Signed)
Ok to fill patient has an upcoming appointment 01/26/17.

## 2016-12-10 ENCOUNTER — Telehealth: Payer: Self-pay | Admitting: Internal Medicine

## 2016-12-10 DIAGNOSIS — R918 Other nonspecific abnormal finding of lung field: Secondary | ICD-10-CM

## 2016-12-10 DIAGNOSIS — E041 Nontoxic single thyroid nodule: Secondary | ICD-10-CM

## 2016-12-10 DIAGNOSIS — D72829 Elevated white blood cell count, unspecified: Secondary | ICD-10-CM

## 2016-12-10 DIAGNOSIS — I10 Essential (primary) hypertension: Secondary | ICD-10-CM

## 2016-12-10 DIAGNOSIS — I712 Thoracic aortic aneurysm, without rupture, unspecified: Secondary | ICD-10-CM

## 2016-12-10 DIAGNOSIS — M353 Polymyalgia rheumatica: Secondary | ICD-10-CM

## 2016-12-10 DIAGNOSIS — E7800 Pure hypercholesterolemia, unspecified: Secondary | ICD-10-CM

## 2016-12-10 NOTE — Telephone Encounter (Signed)
Pt daughter Letitia Libra called to follow up on pt CT scan results form 12/05/2016. Please advise?  Call [place] @ [phone]. Thank you!

## 2016-12-10 NOTE — Telephone Encounter (Signed)
Please advise 

## 2016-12-11 NOTE — Telephone Encounter (Signed)
Left message to return call to our office.  

## 2016-12-11 NOTE — Telephone Encounter (Signed)
Pt notified of CT results.  She was aware daughter called.  She will talk with daughter and notify of results.  I have place order for CT surgery referral and thyroid ultrasound.  She is aware and agrees.  She states she missed her lab appt and needs to reschedule fasting labs.  Please schedule and notify pt of appt date and time.  Thanks

## 2016-12-12 DIAGNOSIS — Z8669 Personal history of other diseases of the nervous system and sense organs: Secondary | ICD-10-CM | POA: Insufficient documentation

## 2016-12-15 ENCOUNTER — Ambulatory Visit (INDEPENDENT_AMBULATORY_CARE_PROVIDER_SITE_OTHER): Payer: Medicare Other | Admitting: Cardiothoracic Surgery

## 2016-12-15 ENCOUNTER — Encounter: Payer: Self-pay | Admitting: Cardiothoracic Surgery

## 2016-12-15 VITALS — BP 151/75 | HR 66 | Temp 97.5°F | Ht 65.0 in | Wt 138.0 lb

## 2016-12-15 DIAGNOSIS — I6522 Occlusion and stenosis of left carotid artery: Secondary | ICD-10-CM

## 2016-12-15 DIAGNOSIS — R911 Solitary pulmonary nodule: Secondary | ICD-10-CM

## 2016-12-15 NOTE — Telephone Encounter (Signed)
Left message to return call to our office.  

## 2016-12-15 NOTE — Progress Notes (Signed)
[name], female   DOB: 12/24/1935, 81 y.o.   MRN: [id number]  Chief Complaint  Patient presents with  . New Patient (Initial Visit)    Lung nodules, Thoracic aortic aneurysm without rupture-referred from Charlene Scott--per [facility] 12/11/16 @ 3:41pm    Referred By Dr. [name] Reason for Referral ascending aortic aneurysm  HPI Location, Quality, Duration, Severity, Timing, Context, Modifying Factors, Associated Signs and Symptoms.  Beth Espinoza is a 81 y.o. female.  She has a rec These are very vague symptoms. The first she described as a feeling of pain in her left teeth which lasted about 3 days. Second episode she describes as a severe headache. She does have a history of migraines but this one felt slightly different. In any event she's had multiple x-rays including MRIs and ultrasounds of her carotids.She's had a angiography of her carotids.  As part of her ongoing evaluation she was scheduled to have a chest CT scan. She had this made earlier this month and that demonstrated an ascending aortic aneurysm measuring 4.1 cm. In addition the CT showed several small pulmonary nodules all 2-3 mm in size. She comes in today for my evaluation and recommendations. She is a lifelong nonsmoker. She has no history of malignancy in her family. She states that her father died at the age of 40 from a "hole in his heart" she has a history of a murmurs a child but no further intervention or diagnostic maneuvers were made.   Past Medical History:  Diagnosis Date  . Arthritis   . Cataract   . History of chicken pox   . History of kidney stones October 2013  . Hx: UTI (urinary tract infection)   . Hyperlipidemia   . Hypertension   . Rheumatoid arthritis ([place])   . Stroke [facility])     Past Surgical History:  Procedure Laterality Date  . ABDOMINAL HYSTERECTOMY    . BREAST BIOPSY Right 1995  . IR ANGIO INTRA EXTRACRAN SEL COM CAROTID INNOMINATE BILAT MOD SED  07/18/2016  . IR ANGIO VERTEBRAL SEL  VERTEBRAL BILAT MOD SED  07/18/2016  . PARTIAL HYSTERECTOMY  1972  . TONSILLECTOMY AND ADENOIDECTOMY  1945    Family History  Problem Relation Age of Onset  . Asthma Mother   . Diabetes Mother   . Cataracts Mother   . Heart disease Father   . Diabetes Father   . Hypertension Father   . Cancer Paternal Aunt        Liver  . Diabetes Paternal Grandmother   . Diabetes Sister   . Retinal degeneration Sister   . Glaucoma Sister     Social History Social History  Substance Use Topics  . Smoking status: Never Smoker  . Smokeless tobacco: Never Used  . Alcohol use No    Allergies  Allergen Reactions  . Pneumovax [Pneumococcal Polysaccharide Vaccine] Swelling  . Pneumococcal Vaccine Swelling    Current Outpatient Prescriptions  Medication Sig Dispense Refill  . ALPRAZolam (XANAX) 0.5 MG tablet TAKE 1 TABLET AT BEDTIME AS NEEDED FOR ANXIETY. 30 tablet 0  . amLODipine (NORVASC) 5 MG tablet Take 1 tablet (5 mg total) by mouth daily. 90 tablet 1  . Ascorbic Acid (VITAMIN C) 1000 MG tablet Take 1,000 mg by mouth daily.     Marland Kitchen atenolol (TENORMIN) 25 MG tablet Take 1 tablet (25 mg total) by mouth daily. 30 tablet 0  . clopidogrel (PLAVIX) 75 MG tablet Take 1 tablet (75 mg total) by  mouth daily. 30 tablet 6  . Coenzyme Q10 100 MG capsule Take 100 mg by mouth daily.    . folic acid (FOLVITE) 546 MCG tablet Take 800 mcg by mouth daily.    . methotrexate (RHEUMATREX) 2.5 MG tablet Take 6 tablets per week. (Patient taking differently: Take 8 tablets per week.) 4 tablet 0  . PARoxetine (PAXIL) 20 MG tablet Take 1 tablet (20 mg total) by mouth daily. 30 tablet 0  . rosuvastatin (CRESTOR) 5 MG tablet Take 1 tablet (5 mg total) by mouth daily. 30 tablet 0   No current facility-administered medications for this visit.       Review of Systems A complete review of systems was asked and was negative except for the following positive findings reflux, migraines, easy bruising  Blood pressure  (!) 151/75, pulse 66, temperature (!) 97.5 F (36.4 C), temperature source Oral, height 5\' 5"  (1.651 m), weight 138 lb (62.6 kg).  Physical Exam CONSTITUTIONAL:  Pleasant, well-developed, well-nourished, and in no acute distress. EYES: Pupils equal and reactive to light, Sclera non-icteric EARS, NOSE, MOUTH AND THROAT:  The oropharynx was clear.  Dentition is good repair.  Oral mucosa pink and moist. LYMPH NODES:  Lymph nodes in the neck and axillae were normal RESPIRATORY:  Lungs were clear.  Normal respiratory effort without pathologic use of accessory muscles of respiration CARDIOVASCULAR: Heart was regular without murmurs.  There were no carotid bruits. GI: The abdomen was soft, nontender, and nondistended. There were no palpable masses. There was no hepatosplenomegaly. There were normal bowel sounds in all quadrants. GU:  Rectal deferred.   MUSCULOSKELETAL:  Normal muscle strength and tone.  No clubbing or cyanosis.   SKIN:  There were no pathologic skin lesions.  There were no nodules on palpation. NEUROLOGIC:  Sensation is normal.  Cranial nerves are grossly intact. PSYCH:  Oriented to person, place and time.  Mood and affect are normal.  Data Reviewed CT of the chest  I have personally reviewed the patient's imaging, laboratory findings and medical records.    Assessment    I have independently reviewed the patient's CT scan. This shows mild dilatation of the ascendi aorta. There are 2 pulmonary nodules each less than 3 mm.    Plan    I explained to the patient that at the present time we would not recommend any intervention for her as sending aorta or for the lung nodules. However we would recommend continued follow-up and I have requested that she come back in one year with a CTA of her chest. We can look at both the nodules and her aorta at the same time.      Nestor Lewandowsky, MD 12/15/2016, 2:51 PM

## 2016-12-15 NOTE — Patient Instructions (Signed)
We will contact you with an appointment to follow up in a year as well as referral.

## 2016-12-16 NOTE — Telephone Encounter (Signed)
Called patient she made lab app this am.

## 2016-12-17 ENCOUNTER — Other Ambulatory Visit (INDEPENDENT_AMBULATORY_CARE_PROVIDER_SITE_OTHER): Payer: Medicare Other

## 2016-12-17 DIAGNOSIS — E041 Nontoxic single thyroid nodule: Secondary | ICD-10-CM

## 2016-12-17 DIAGNOSIS — I10 Essential (primary) hypertension: Secondary | ICD-10-CM

## 2016-12-17 DIAGNOSIS — D72829 Elevated white blood cell count, unspecified: Secondary | ICD-10-CM

## 2016-12-17 DIAGNOSIS — E7800 Pure hypercholesterolemia, unspecified: Secondary | ICD-10-CM | POA: Diagnosis not present

## 2016-12-17 LAB — CBC WITH DIFFERENTIAL/PLATELET
BASOS PCT: 1.8 % (ref 0.0–3.0)
Basophils Absolute: 0.1 10*3/uL (ref 0.0–0.1)
Eosinophils Absolute: 0.3 10*3/uL (ref 0.0–0.7)
Eosinophils Relative: 3.8 % (ref 0.0–5.0)
HEMATOCRIT: 43.4 % (ref 36.0–46.0)
Hemoglobin: 14.1 g/dL (ref 12.0–15.0)
LYMPHS PCT: 25.8 % (ref 12.0–46.0)
Lymphs Abs: 2.1 10*3/uL (ref 0.7–4.0)
MCHC: 32.6 g/dL (ref 30.0–36.0)
MCV: 92.7 fl (ref 78.0–100.0)
Monocytes Absolute: 0.9 10*3/uL (ref 0.1–1.0)
Monocytes Relative: 11.3 % (ref 3.0–12.0)
NEUTROS ABS: 4.7 10*3/uL (ref 1.4–7.7)
Neutrophils Relative %: 57.3 % (ref 43.0–77.0)
PLATELETS: 346 10*3/uL (ref 150.0–400.0)
RBC: 4.68 Mil/uL (ref 3.87–5.11)
RDW: 16.5 % — ABNORMAL HIGH (ref 11.5–15.5)
WBC: 8.1 10*3/uL (ref 4.0–10.5)

## 2016-12-17 LAB — LIPID PANEL
CHOLESTEROL: 172 mg/dL (ref 0–200)
HDL: 44.7 mg/dL (ref >39.00)
LDL Cholesterol: 100 mg/dL — ABNORMAL HIGH (ref 0–99)
NonHDL: 126.99
TRIGLYCERIDES: 133 mg/dL (ref 0.0–149.0)
Total CHOL/HDL Ratio: 4
VLDL: 26.6 mg/dL (ref 0.0–40.0)

## 2016-12-17 LAB — BASIC METABOLIC PANEL
BUN: 16 mg/dL (ref 6–23)
CHLORIDE: 105 meq/L (ref 96–112)
CO2: 26 mEq/L (ref 19–32)
Calcium: 9.9 mg/dL (ref 8.4–10.5)
Creatinine, Ser: 0.84 mg/dL (ref 0.40–1.20)
GFR: 69.45 mL/min (ref >60.00)
GLUCOSE: 93 mg/dL (ref 70–99)
POTASSIUM: 4.3 meq/L (ref 3.5–5.1)
Sodium: 141 mEq/L (ref 135–145)

## 2016-12-17 LAB — HEPATIC FUNCTION PANEL
ALBUMIN: 4.2 g/dL (ref 3.5–5.2)
ALT: 23 U/L (ref 0–35)
AST: 24 U/L (ref 0–37)
Alkaline Phosphatase: 60 U/L (ref 39–117)
Bilirubin, Direct: 0.1 mg/dL (ref 0.0–0.3)
TOTAL PROTEIN: 6.9 g/dL (ref 6.0–8.3)
Total Bilirubin: 0.4 mg/dL (ref 0.2–1.2)

## 2016-12-17 LAB — TSH: TSH: 1.21 u[IU]/mL (ref 0.35–4.50)

## 2016-12-18 ENCOUNTER — Other Ambulatory Visit: Payer: Self-pay | Admitting: Internal Medicine

## 2016-12-19 ENCOUNTER — Encounter: Payer: Self-pay | Admitting: Internal Medicine

## 2016-12-20 ENCOUNTER — Other Ambulatory Visit: Payer: Self-pay | Admitting: Internal Medicine

## 2016-12-22 ENCOUNTER — Ambulatory Visit
Admission: RE | Admit: 2016-12-22 | Discharge: 2016-12-22 | Disposition: A | Discharge status: Home / Self Care | Payer: Medicare Other | Source: Ambulatory Visit | Attending: Internal Medicine | Admitting: Internal Medicine

## 2016-12-22 DIAGNOSIS — E042 Nontoxic multinodular goiter: Secondary | ICD-10-CM | POA: Diagnosis not present

## 2016-12-22 DIAGNOSIS — E041 Nontoxic single thyroid nodule: Secondary | ICD-10-CM

## 2016-12-26 DIAGNOSIS — Z23 Encounter for immunization: Secondary | ICD-10-CM | POA: Diagnosis not present

## 2016-12-29 NOTE — Telephone Encounter (Signed)
Left pt message asking to call Ebony Hail back directly at [phone] to reschedule AWV. Thanks!  *NOTE* no hx of AWV

## 2016-12-30 ENCOUNTER — Ambulatory Visit (INDEPENDENT_AMBULATORY_CARE_PROVIDER_SITE_OTHER): Payer: Medicare Other

## 2016-12-30 VITALS — BP 128/72 | HR 60 | Temp 98.0°F | Resp 14 | Ht 65.0 in | Wt 141.0 lb

## 2016-12-30 DIAGNOSIS — Z0000 Encounter for general adult medical examination without abnormal findings: Secondary | ICD-10-CM | POA: Diagnosis not present

## 2016-12-30 DIAGNOSIS — Z1331 Encounter for screening for depression: Secondary | ICD-10-CM

## 2016-12-30 NOTE — Patient Instructions (Addendum)
  Beth Espinoza , Thank you for taking time to come for your Medicare Wellness Visit. I appreciate your ongoing commitment to your health goals. Please review the following plan we discussed and let me know if I can assist you in the future.   Follow up with Dr. Nicki Reaper as needed.    Bring a copy of your [place] and/or Living Will to be scanned into chart.  Have a great day!  These are the goals we discussed: Goals    . Increase lean proteins          Low carb foods    . Increase physical activity          Walk for exercise        This is a list of the screening recommended for you and due dates:  Health Maintenance  Topic Date Due  . Tetanus Vaccine  08/15/1956  . DEXA scan (bone density measurement)  08/16/2002  . Mammogram  02/05/2025*  . Flu Shot  Completed  . Pneumonia vaccines  Excluded  *Topic was postponed. The date shown is not the original due date.

## 2016-12-30 NOTE — Progress Notes (Signed)
Subjective:   Beth Espinoza is a 82 y.o. female who presents for an Initial Medicare Annual Wellness Visit.  Review of Systems    No ROS.  Medicare Wellness Visit. Additional risk factors are reflected in the social history.  Cardiac Risk Factors include: advanced age (>34men, >65 women);hypertension     Objective:    Today's Vitals   12/30/16 1311  BP: 128/72  Pulse: 60  Resp: 14  Temp: 98 F (36.7 C)  TempSrc: Oral  SpO2: 97%  Weight: 141 lb (64 kg)  Height: 5\' 5"  (1.651 m)   Body mass index is 23.46 kg/m.   Current Medications (verified) Outpatient Encounter Prescriptions as of 12/30/2016  Medication Sig  . ALPRAZolam (XANAX) 0.5 MG tablet TAKE 1 TABLET AT BEDTIME AS NEEDED FOR ANXIETY.  Marland Kitchen amLODipine (NORVASC) 5 MG tablet Take 1 tablet (5 mg total) by mouth daily.  . Ascorbic Acid (VITAMIN C) 1000 MG tablet Take 1,000 mg by mouth daily.   Marland Kitchen atenolol (TENORMIN) 25 MG tablet Take 1 tablet (25 mg total) by mouth daily.  Marland Kitchen atenolol (TENORMIN) 25 MG tablet Take 1 tablet (25 mg total) by mouth daily.  . clopidogrel (PLAVIX) 75 MG tablet Take 1 tablet (75 mg total) by mouth daily.  . Coenzyme Q10 100 MG capsule Take 100 mg by mouth daily.  . folic acid (FOLVITE) 782 MCG tablet Take 800 mcg by mouth daily.  . methotrexate (RHEUMATREX) 2.5 MG tablet Take 6 tablets per week. (Patient taking differently: Take 8 tablets per week.)  . PARoxetine (PAXIL) 20 MG tablet Take 1 tablet (20 mg total) by mouth daily.  . rosuvastatin (CRESTOR) 5 MG tablet Take 1 tablet (5 mg total) by mouth daily.   No facility-administered encounter medications on file as of 12/30/2016.     Allergies (verified) Pneumovax [pneumococcal polysaccharide vaccine] and Pneumococcal vaccine   History: Past Medical History:  Diagnosis Date  . Arthritis   . Cataract   . History of chicken pox   . History of kidney stones October 2013  . Hx: UTI (urinary tract infection)   . Hyperlipidemia   . Hypertension    . Rheumatoid arthritis ([place])   . Stroke [facility])    Past Surgical History:  Procedure Laterality Date  . ABDOMINAL HYSTERECTOMY    . BREAST BIOPSY Right 1995  . IR ANGIO INTRA EXTRACRAN SEL COM CAROTID INNOMINATE BILAT MOD SED  07/18/2016  . IR ANGIO VERTEBRAL SEL VERTEBRAL BILAT MOD SED  07/18/2016  . PARTIAL HYSTERECTOMY  1972  . TONSILLECTOMY AND ADENOIDECTOMY  1945   Family History  Problem Relation Age of Onset  . Asthma Mother   . Diabetes Mother   . Cataracts Mother   . Heart disease Father   . Diabetes Father   . Hypertension Father   . Cancer Paternal Aunt        Liver  . Diabetes Paternal Grandmother   . Diabetes Sister   . Retinal degeneration Sister   . Glaucoma Sister    Social History   Occupational History  . Not on file.   Social History Main Topics  . Smoking status: Never Smoker  . Smokeless tobacco: Never Used  . Alcohol use No  . Drug use: No  . Sexual activity: Not on file    Tobacco Counseling Counseling given: Not Answered   Activities of Daily Living In your present state of health, do you have any difficulty performing the following activities: 12/30/2016 07/18/2016  Hearing? N  N  Vision? N N  Difficulty concentrating or making decisions? Y N  Walking or climbing stairs? N N  Dressing or bathing? N Y  Doing errands, shopping? N -  Preparing Food and eating ? N -  Using the Toilet? N -  In the past six months, have you accidently leaked urine? Y -  Do you have problems with loss of bowel control? N -  Managing your Medications? Y -  Comment husband assists -  Managing your Finances? Y -  Comment shared responsibility with husband -  Housekeeping or managing your Housekeeping? N -  Some recent data might be hidden    Immunizations and Health Maintenance Immunization History  Administered Date(s) Administered  . Influenza Split 12/20/2013  . Influenza,inj,Quad PF,6+ Mos 01/31/2013  . Influenza-Unspecified 12/01/2013, 12/13/2014,  12/23/2016   Health Maintenance Due  Topic Date Due  . TETANUS/TDAP  08/15/1956  . DEXA SCAN  08/16/2002    Patient Care Team: [name], MD as PCP - General (Internal Medicine)  Indicate any recent Medical Services you may have received from other than [facility] providers in the past year (date may be approximate).     Assessment:   This is a routine wellness examination for Beth Espinoza. The goal of the wellness visit is to assist the patient how to close the gaps in care and create a preventative care plan for the patient.   The roster of all physicians providing medical care to patient is listed in the Snapshot section of the chart.  Osteoporosis risk reviewed.  Dexa Scan deferred per patient preference.  Safety issues reviewed; Smoke and carbon monoxide detectors in the home. No firearms in the home.  Wears seatbelts when driving or riding with others. Patient does wear sunscreen or protective clothing when in direct sunlight. No violence in the home.  Depression- PHQ 2 &9 complete.  No signs/symptoms or verbal communication regarding little pleasure in doing things, feeling down, depressed or hopeless. No changes in sleeping, energy, eating, concentrating.  No thoughts of self harm or harm towards others.  Time spent on this topic is 10 minutes.   Patient is alert, normal appearance, oriented to person/place/and time.  Correctly identified the president of the [place], recall of 3/3 words, and performing simple calculations. Displays appropriate judgement and can read correct time from watch face.   No new identified risk were noted.  No failures at ADL's or IADL's.    BMI- discussed the importance of a healthy diet, water intake and the benefits of aerobic exercise. Educational material provided.   24 hour diet recall: Breakfast: oatmeal, bannana Lunch: grilled cheese sandwich Dinner: popcorn  Daily fluid intake: 1 cups of caffeine, 2-4 cups of water, 2 cups of milk  Dental-  every 12 months.  Eye- Visual acuity not assessed per patient preference since they have regular follow up with the ophthalmologist.  Wears corrective lenses.  Sleep patterns- Sleeps 6-8 hours at night.  Wakes feeling rested.   TDAP deferred per patient preference.  Patient Concerns: None at this time. Follow up with PCP as needed.  Hearing/Vision screen Hearing Screening Comments: Patient is able to hear conversational tones without difficulty.  No issues reported.   Vision Screening Comments: Followed by [facility] Wears corrective lenses Last OV 2018 Cataract extraction, R eye only Visual acuity not assessed per patient preference since they have regular follow up with the ophthalmologist  Dietary issues and exercise activities discussed: Current Exercise Habits: Home exercise routine, Type  of exercise: walking, Time (Minutes): 10, Frequency (Times/Week): 1, Weekly Exercise (Minutes/Week): 10, Intensity: Mild  Goals    . Increase lean proteins          Low carb foods    . Increase physical activity          Walk for exercise       Depression Screen PHQ 2/9 Scores 12/30/2016 12/24/2015 09/07/2015 10/04/2014 10/07/2013 06/25/2012  PHQ - 2 Score 0 0 0 1 0 0  PHQ- 9 Score 0 - - - - -    Fall Risk Fall Risk  12/30/2016 12/24/2015 09/07/2015 10/04/2014 10/07/2013  Falls in the past year? No No No No No    Cognitive Function: MMSE - Mini Mental State Exam 12/30/2016  Orientation to time 5  Orientation to Place 5  Registration 3  Attention/ Calculation 5  Recall 3  Language- name 2 objects 2  Language- repeat 1  Language- follow 3 step command 3  Language- read & follow direction 1  Write a sentence 1  Copy design 1  Total score 30        Screening Tests Health Maintenance  Topic Date Due  . TETANUS/TDAP  08/15/1956  . DEXA SCAN  08/16/2002  . MAMMOGRAM  02/05/2025 (Originally 03/09/2014)  . INFLUENZA VACCINE  Completed  . PNA vac Low Risk Adult  Excluded       Plan:    End of life planning; Advance aging; Advanced directives discussed. Copy of current HCPOA/Living Will requested.    I have personally reviewed and noted the following in the patient's chart:   . Medical and social history . Use of alcohol, tobacco or illicit drugs  . Current medications and supplements . Functional ability and status . Nutritional status . Physical activity . Advanced directives . List of other physicians . Hospitalizations, surgeries, and ER visits in previous 12 months . Vitals . Screenings to include cognitive, depression, and falls . Referrals and appointments  In addition, I have reviewed and discussed with patient certain preventive protocols, quality metrics, and best practice recommendations. A written personalized care plan for preventive services as well as general preventive health recommendations were provided to patient.     OBrien-Blaney, Mellody Masri L, LPN   97/98/9211    Reviewed above information.  Agree with assessment and plan.   Dr Nicki Reaper

## 2017-01-05 ENCOUNTER — Other Ambulatory Visit: Payer: Self-pay | Admitting: Internal Medicine

## 2017-01-06 ENCOUNTER — Encounter (INDEPENDENT_AMBULATORY_CARE_PROVIDER_SITE_OTHER): Payer: Self-pay | Admitting: Vascular Surgery

## 2017-01-06 ENCOUNTER — Ambulatory Visit (INDEPENDENT_AMBULATORY_CARE_PROVIDER_SITE_OTHER): Payer: Medicare Other | Admitting: Vascular Surgery

## 2017-01-06 VITALS — BP 127/67 | HR 53 | Resp 16 | Ht 65.0 in | Wt 143.0 lb

## 2017-01-06 DIAGNOSIS — G459 Transient cerebral ischemic attack, unspecified: Secondary | ICD-10-CM

## 2017-01-06 DIAGNOSIS — I6523 Occlusion and stenosis of bilateral carotid arteries: Secondary | ICD-10-CM | POA: Diagnosis not present

## 2017-01-06 DIAGNOSIS — I7121 Aneurysm of the ascending aorta, without rupture: Secondary | ICD-10-CM

## 2017-01-06 DIAGNOSIS — I712 Thoracic aortic aneurysm, without rupture: Secondary | ICD-10-CM | POA: Diagnosis not present

## 2017-01-06 NOTE — Progress Notes (Signed)
Subjective:    [name], female    DOB: [birth year], 81 y.o.   MRN: [id number] Chief Complaint  Patient presents with  . New Patient (Initial Visit)    LMVM to advise pt.   Presents as a new patient referred by Dr. Genevive Bi for "carotid stenosis". The patient ws seen by Dr. Genevive Bi for a thoracic aneurysm. CTA on 12/05/2016 measuring a 4.1cm ascending aneurysm. Negative for aortic dissection. The patient is asymptomatic and denies any shortness of breath or chest pain. The patient underwent vertebral angiogram in May 2018 at [facility] which showed some cerebral stenosis with what seems like mild carotid stenosis.The patient does not want to return to the doctor who perform the angiogram as he was "scared of her emotional support dog". The patient states she has experienced 2 "TIAs". Describes the first TIA as a "zap of lightning from the middle of her head to the top left teeth". Her second TIA - the patient states she felt what she describes as a "bike pump" pumping in her head for over a week. The patient states when she raised her hands the popping sound increased. Patient states this lasted for approximately 1 week and stopped. The patient denies any amaurosis fugax, or neurological deficits. Patient denies any fever, nausea or vomiting.   Review of Systems  Constitutional: Negative.   HENT: Negative.   Eyes: Negative.   Respiratory: Negative.   Cardiovascular: Negative.   Gastrointestinal: Negative.   Endocrine: Negative.   Genitourinary: Negative.   Musculoskeletal: Negative.   Skin: Negative.   Neurological:       TIA  Hematological: Negative.   Psychiatric/Behavioral: Negative.       Objective:   Physical Exam  Constitutional: She is oriented to person, place, and time. She appears well-developed and well-nourished. No distress.  HENT:  Head: Normocephalic and atraumatic.  Eyes: Conjunctivae are normal. Pupils are equal, round, and reactive to light.  Neck: Normal  range of motion.  No carotid bruits noted on exam  Cardiovascular: Normal rate, regular rhythm, normal heart sounds and intact distal pulses.  Pulses:      Radial pulses are 2+ on the right side, and 2+ on the left side.       Dorsalis pedis pulses are 2+ on the right side, and 2+ on the left side.       Posterior tibial pulses are 2+ on the right side, and 2+ on the left side.  Pulmonary/Chest: Effort normal and breath sounds normal.  Musculoskeletal: Normal range of motion. She exhibits no edema.  Neurological: She is alert and oriented to person, place, and time.  Skin: Skin is warm and dry. She is not diaphoretic.  Psychiatric: She has a normal mood and affect. Her behavior is normal. Judgment and thought content normal.  Vitals reviewed.  BP 127/67 (BP Location: Left Arm)   Pulse (!) 53   Resp 16   Ht 5\' 5"  (1.651 m)   Wt 143 lb (64.9 kg)   BMI 23.80 kg/m   Past Medical History:  Diagnosis Date  . Arthritis   . Cataract   . History of chicken pox   . History of kidney stones October 2013  . Hx: UTI (urinary tract infection)   . Hyperlipidemia   . Hypertension   . Rheumatoid arthritis ([place])   . Stroke [facility])    Social History   Socioeconomic History  . Marital status: Married    Spouse name: Not on file  .  Number of children: Not on file  . Years of education: Not on file  . Highest education level: Not on file  Social Needs  . Financial resource strain: Not on file  . Food insecurity - worry: Not on file  . Food insecurity - inability: Not on file  . Transportation needs - medical: Not on file  . Transportation needs - non-medical: Not on file  Occupational History  . Not on file  Tobacco Use  . Smoking status: Never Smoker  . Smokeless tobacco: Never Used  Substance and Sexual Activity  . Alcohol use: No    Alcohol/week: 0.0 oz  . Drug use: No  . Sexual activity: Not on file  Other Topics Concern  . Not on file  Social History Narrative  . Not on file    Past Surgical History:  Procedure Laterality Date  . ABDOMINAL HYSTERECTOMY    . BREAST BIOPSY Right 1995  . IR ANGIO INTRA EXTRACRAN SEL COM CAROTID INNOMINATE BILAT MOD SED  07/18/2016  . IR ANGIO VERTEBRAL SEL VERTEBRAL BILAT MOD SED  07/18/2016  . PARTIAL HYSTERECTOMY  1972  . TONSILLECTOMY AND ADENOIDECTOMY  1945   Family History  Problem Relation Age of Onset  . Asthma Mother   . Diabetes Mother   . Cataracts Mother   . Heart disease Father   . Diabetes Father   . Hypertension Father   . Cancer Paternal Aunt        Liver  . Diabetes Paternal Grandmother   . Diabetes Sister   . Retinal degeneration Sister   . Glaucoma Sister    Allergies  Allergen Reactions  . Pneumovax [Pneumococcal Polysaccharide Vaccine] Swelling  . Pneumococcal Vaccine Swelling      Assessment & Plan:  Presents as a new patient referred by Dr. Genevive Bi for "carotid stenosis". The patient ws seen by Dr. Genevive Bi for a thoracic aneurysm. CTA on 12/05/2016 measuring a 4.1cm ascending aneurysm. Negative for aortic dissection. The patient is asymptomatic and denies any shortness of breath or chest pain. The patient underwent vertebral angiogram in May 2018 at [facility] which showed some cerebral stenosis with what seems like mild carotid stenosis.The patient does not want to return to the doctor who perform the angiogram as he was "scared of her emotional support dog". The patient states she has experienced 2 "TIAs". Describes the first TIA as a "zap of lightning from the middle of her head to the top left teeth". Her second TIA - the patient states she felt what she describes as a "bike pump" pumping in her head for over a week. The patient states when she raised her hands the popping sound increased. Patient states this lasted for approximately 1 week and stopped. The patient denies any amaurosis fugax, or neurological deficits. Patient denies any fever, nausea or vomiting.  1. Bilateral carotid artery stenosis -  New Patient presents asymptomatic today -  She denies any amaurosis fugax or neurological symptoms. I will bring the patient back to undergo a bilateral carotid ultrasound to assess anatomy and degree of carotid stenosis. I have discussed with the patient at length the risk factors for and pathogenesis of atherosclerotic disease and encouraged a healthy diet, regular exercise regimen and blood pressure / glucose control.  Patient was instructed to contact our office in the interim with problems such as arm / leg weakness or numbness, speech / swallowing difficulty or temporary monocular blindness. The patient expresses their understanding.   - VAS US CAROTID;  Future  2. TIA (transient ischemic attack) - New  unsure if the patient has actually experienced a TIA based on her symptoms Will be happy to brine patient back to undergo a carotid ultrasound to assess for any carotid disease  - VAS US CAROTID; Future  3. Ascending aortic aneurysm [facility]) - New Dr. Genevive Bi seems to be following this as per his last note he would like to see the patient back in 1 year to undergo another CTA of the chest. Today she is asymptomatic  Current Outpatient Medications on File Prior to Visit  Medication Sig Dispense Refill  . ALPRAZolam (XANAX) 0.5 MG tablet TAKE 1 TABLET AT BEDTIME AS NEEDED FOR ANXIETY. 30 tablet 0  . amLODipine (NORVASC) 5 MG tablet Take 1 tablet (5 mg total) by mouth daily. 90 tablet 1  . Ascorbic Acid (VITAMIN C) 1000 MG tablet Take 1,000 mg by mouth daily.     Marland Kitchen atenolol (TENORMIN) 25 MG tablet Take 1 tablet (25 mg total) by mouth daily. 30 tablet 0  . atenolol (TENORMIN) 25 MG tablet Take 1 tablet (25 mg total) by mouth daily. 30 tablet 0  . clopidogrel (PLAVIX) 75 MG tablet Take 1 tablet (75 mg total) by mouth daily. 30 tablet 6  . Coenzyme Q10 100 MG capsule Take 100 mg by mouth daily.    . folic acid (FOLVITE) 465 MCG tablet Take 800 mcg by mouth daily.    . methotrexate (RHEUMATREX)  2.5 MG tablet Take 6 tablets per week. (Patient taking differently: Take 8 tablets per week.) 4 tablet 0  . PARoxetine (PAXIL) 20 MG tablet Take 1 tablet (20 mg total) by mouth daily. 30 tablet 0  . rosuvastatin (CRESTOR) 5 MG tablet Take 1 tablet (5 mg total) by mouth daily. 30 tablet 0   No current facility-administered medications on file prior to visit.    There are no Patient Instructions on file for this visit. No Follow-up on file.  Kessler Solly A Avarey Yaeger, PA-C

## 2017-01-14 ENCOUNTER — Other Ambulatory Visit: Payer: Self-pay | Admitting: Internal Medicine

## 2017-01-15 ENCOUNTER — Other Ambulatory Visit: Payer: Self-pay | Admitting: Internal Medicine

## 2017-01-15 ENCOUNTER — Other Ambulatory Visit: Payer: Self-pay

## 2017-01-15 ENCOUNTER — Telehealth: Payer: Self-pay

## 2017-01-15 MED ORDER — ALPRAZOLAM 0.5 MG PO TABS: ORAL_TABLET | ORAL | 1 refills | Status: DC

## 2017-01-15 NOTE — Telephone Encounter (Signed)
Ok to refill x 1  

## 2017-01-15 NOTE — Telephone Encounter (Signed)
Refill request for Alprazolam 0.5mg   Last O/V 09/22/16 Next O/V 01/26/17 Last refill 12/09/16

## 2017-01-26 ENCOUNTER — Encounter: Payer: Self-pay | Admitting: Internal Medicine

## 2017-01-26 ENCOUNTER — Ambulatory Visit (INDEPENDENT_AMBULATORY_CARE_PROVIDER_SITE_OTHER): Payer: Medicare Other | Admitting: Internal Medicine

## 2017-01-26 ENCOUNTER — Other Ambulatory Visit: Payer: Self-pay | Admitting: Internal Medicine

## 2017-01-26 VITALS — BP 132/78 | HR 68 | Temp 98.4°F | Ht 65.0 in | Wt 151.0 lb

## 2017-01-26 DIAGNOSIS — I10 Essential (primary) hypertension: Secondary | ICD-10-CM

## 2017-01-26 DIAGNOSIS — I7121 Aneurysm of the ascending aorta, without rupture: Secondary | ICD-10-CM

## 2017-01-26 DIAGNOSIS — E7800 Pure hypercholesterolemia, unspecified: Secondary | ICD-10-CM

## 2017-01-26 DIAGNOSIS — M353 Polymyalgia rheumatica: Secondary | ICD-10-CM

## 2017-01-26 DIAGNOSIS — I77810 Thoracic aortic ectasia: Secondary | ICD-10-CM | POA: Diagnosis not present

## 2017-01-26 DIAGNOSIS — E2839 Other primary ovarian failure: Secondary | ICD-10-CM

## 2017-01-26 DIAGNOSIS — I6523 Occlusion and stenosis of bilateral carotid arteries: Secondary | ICD-10-CM

## 2017-01-26 DIAGNOSIS — I712 Thoracic aortic aneurysm, without rupture: Secondary | ICD-10-CM | POA: Diagnosis not present

## 2017-01-26 DIAGNOSIS — F419 Anxiety disorder, unspecified: Secondary | ICD-10-CM | POA: Diagnosis not present

## 2017-01-26 NOTE — Progress Notes (Signed)
[name], female   DOB: 03-Aug-1935, 81 y.o.   MRN: [id number]   Subjective:    [name], female    DOB: [birth year], 81 y.o.   MRN: [id number]  HPI  Patient here for a scheduled follow up.  She reports she is doing better.  Feels good.  Staying active.  She was recently evaluated by Dr Genevive Bi.  Note reviewed.  CT chest with mild dilatation of the ascending aorta and two pulmonary nodules (< 32mm).  Felt no further intervention warranted at this time.  Recommended f/u CTA chest in one year.  She also saw [facility] 01/06/17.  Ordered carotid ultrasound.  They are following.  Overall she states she feels good.  No chest pain.  No sob.  No acid reflux reported.  No abdomina pain.  Bowels moving.  Discussed bone density.  She agrees.  Has previously refused.     Past Medical History:  Diagnosis Date  . Arthritis   . Cataract   . History of chicken pox   . History of kidney stones October 2013  . Hx: UTI (urinary tract infection)   . Hyperlipidemia   . Hypertension   . Rheumatoid arthritis ([place])   . Stroke [facility])    Past Surgical History:  Procedure Laterality Date  . ABDOMINAL HYSTERECTOMY    . BREAST BIOPSY Right 1995  . IR ANGIO INTRA EXTRACRAN SEL COM CAROTID INNOMINATE BILAT MOD SED  07/18/2016  . IR ANGIO VERTEBRAL SEL VERTEBRAL BILAT MOD SED  07/18/2016  . PARTIAL HYSTERECTOMY  1972  . TONSILLECTOMY AND ADENOIDECTOMY  1945   Family History  Problem Relation Age of Onset  . Asthma Mother   . Diabetes Mother   . Cataracts Mother   . Heart disease Father   . Diabetes Father   . Hypertension Father   . Cancer Paternal Aunt        Liver  . Diabetes Paternal Grandmother   . Diabetes Sister   . Retinal degeneration Sister   . Glaucoma Sister    Social History   Socioeconomic History  . Marital status: Married    Spouse name: None  . Number of children: None  . Years of education: None  . Highest education level: None  Social Needs  . Financial resource strain:  None  . Food insecurity - worry: None  . Food insecurity - inability: None  . Transportation needs - medical: None  . Transportation needs - non-medical: None  Occupational History  . None  Tobacco Use  . Smoking status: Never Smoker  . Smokeless tobacco: Never Used  Substance and Sexual Activity  . Alcohol use: No    Alcohol/week: 0.0 oz  . Drug use: No  . Sexual activity: None  Other Topics Concern  . None  Social History Narrative  . None    Outpatient Encounter Medications as of 01/26/2017  Medication Sig  . ALPRAZolam (XANAX) 0.5 MG tablet TAKE 1 TABLET AT BEDTIME AS NEEDED FOR ANXIETY.  Marland Kitchen amLODipine (NORVASC) 5 MG tablet Take 1 tablet (5 mg total) by mouth daily.  . Ascorbic Acid (VITAMIN C) 1000 MG tablet Take 1,000 mg by mouth daily.   Marland Kitchen atenolol (TENORMIN) 25 MG tablet Take 1 tablet (25 mg total) by mouth daily.  Marland Kitchen atenolol (TENORMIN) 25 MG tablet Take 1 tablet (25 mg total) by mouth daily.  . Coenzyme Q10 100 MG capsule Take 100 mg by mouth daily.  . folic acid (FOLVITE) 509 MCG  tablet Take 800 mcg by mouth daily.  . methotrexate (RHEUMATREX) 2.5 MG tablet Take 6 tablets per week. (Patient taking differently: Take 8 tablets per week.)  . PARoxetine (PAXIL) 20 MG tablet Take 1 tablet (20 mg total) by mouth daily.  . rosuvastatin (CRESTOR) 5 MG tablet Take 1 tablet (5 mg total) by mouth daily.  . [DISCONTINUED] clopidogrel (PLAVIX) 75 MG tablet Take 1 tablet (75 mg total) by mouth daily.   No facility-administered encounter medications on file as of 01/26/2017.     Review of Systems  Constitutional: Negative for appetite change and unexpected weight change.  HENT: Negative for congestion and sinus pressure.   Respiratory: Negative for cough, chest tightness and shortness of breath.   Cardiovascular: Negative for chest pain, palpitations and leg swelling.  Gastrointestinal: Negative for abdominal pain, diarrhea, nausea and vomiting.  Genitourinary: Negative for  difficulty urinating and dysuria.  Musculoskeletal: Negative for arthralgias and myalgias.  Skin: Negative for color change and rash.  Neurological: Negative for dizziness, light-headedness and headaches.  Psychiatric/Behavioral: Negative for agitation and dysphoric mood.       Objective:     Blood pressure rechecked by me:  138/72  Physical Exam  Constitutional: She appears well-developed and well-nourished. No distress.  HENT:  Nose: Nose normal.  Mouth/Throat: Oropharynx is clear and moist.  Neck: Neck supple. No thyromegaly present.  Cardiovascular: Normal rate and regular rhythm.  Pulmonary/Chest: Breath sounds normal. No respiratory distress. She has no wheezes.  Abdominal: Soft. Bowel sounds are normal. There is no tenderness.  Musculoskeletal: She exhibits no edema or tenderness.  Lymphadenopathy:    She has no cervical adenopathy.  Skin: No rash noted. No erythema.  Psychiatric: She has a normal mood and affect. Her behavior is normal.    BP 132/78 (BP Location: Left Arm, Patient Position: Sitting, Cuff Size: Normal)   Pulse 68   Temp 98.4 F (36.9 C) (Oral)   Ht 5\' 5"  (1.651 m)   Wt 151 lb (68.5 kg)   SpO2 100%   BMI 25.13 kg/m  Wt Readings from Last 3 Encounters:  01/26/17 151 lb (68.5 kg)  01/06/17 143 lb (64.9 kg)  12/30/16 141 lb (64 kg)     Lab Results  Component Value Date   WBC 8.1 12/17/2016   HGB 14.1 12/17/2016   HCT 43.4 12/17/2016   PLT 346.0 12/17/2016   GLUCOSE 93 12/17/2016   CHOL 172 12/17/2016   TRIG 133.0 12/17/2016   HDL 44.70 12/17/2016   LDLDIRECT 137.0 10/17/2014   LDLCALC 100 (H) 12/17/2016   ALT 23 12/17/2016   AST 24 12/17/2016   NA 141 12/17/2016   K 4.3 12/17/2016   CL 105 12/17/2016   CREATININE 0.84 12/17/2016   BUN 16 12/17/2016   CO2 26 12/17/2016   TSH 1.21 12/17/2016   INR 1.07 07/18/2016    US Thyroid  Result Date: 12/22/2016 CLINICAL DATA:  Incidental on CT. 81 year old female with thyroid nodule seen  on prior CT scan EXAM: THYROID ULTRASOUND TECHNIQUE: Ultrasound examination of the thyroid gland and adjacent soft tissues was performed. COMPARISON:  CT scan of the chest 12/05/2016 FINDINGS: Parenchymal Echotexture: Moderately heterogenous Isthmus: 0.4 cm Right lobe: 4.5 x 1.4 x 1.3 cm Left lobe: 4.2 x 1.4 x 1.0 cm _________________________________________________________ Estimated total number of nodules >/= 1 cm: 1 Number of spongiform nodules >/=  2 cm not described below (TR1): 0 Number of mixed cystic and solid nodules >/= 1.5 cm not described below ([place]):  0 _________________________________________________________ Nodule # 1: Location: Right; Superior Maximum size: 1.3 cm; Other 2 dimensions: 1.1 x 0.7 cm Composition: solid/almost completely solid (2) Echogenicity: isoechoic (1) Shape: not taller-than-wide (0) Margins: ill-defined (0) Echogenic foci: none (0) ACR TI-RADS total points: 3. ACR TI-RADS risk category: TR3 (3 points). ACR TI-RADS recommendations: Given size (<1.4 cm) and appearance, this nodule does NOT meet TI-RADS criteria for biopsy or dedicated follow-up. _________________________________________________________ Diffusely heterogeneous thyroid gland with innumerable small bilateral thyroid nodules. The nodules do not meet criteria for further evaluation. IMPRESSION: Heterogeneous and mildly enlarged multinodular thyroid gland. The nodules do not meet criteria for further evaluation. The above is in keeping with the ACR TI-RADS recommendations - J Am Coll Radiol 2017;14:587-595. Electronically Signed   By: Jacqulynn Cadet M.D.   On: 12/22/2016 14:53       Assessment & Plan:   Problem List Items Addressed This Visit    Anxiety    Stable on current regimen.  Does not feel needs any further intervention.  Follow.       Ascending aortic aneurysm ([place])    Saw Dr Genevive Bi 12/2016.  Recommended f/u CTA chest in one year.  He is following.        Carotid stenosis    Continue plavix.   Seeing [facility].        Ectatic thoracic aorta ([facility])    Followed by Dr Genevive Bi now.  Just evaluated.  Recommended f/u CTA chest in one year.        Essential hypertension    Blood pressure under good control.  Continue same medication regimen.  Follow pressures.  Follow metabolic panel.        Relevant Orders   Basic metabolic panel   Hypercholesterolemia    On crestor.  Low cholesterol diet and exercise.  Follow lipid panel and liver function tests.        Relevant Orders   Hepatic function panel   Lipid panel   PMR (polymyalgia rheumatica) (HCC)    On MTX.  Followed by rheumatology.  Some chest/shoulder pain reported.  Discussed with her today.  States feels like her PMR pain.  She got off taking her MTX.  Discussed further w/up (including EKG) to evaluate the pain.  She declines.  Follow.  Take medication regularly.         Other Visit Diagnoses    Estrogen deficiency    -  Primary   Relevant Orders   DG Bone Density       [name], MD

## 2017-01-28 DIAGNOSIS — Z79899 Other long term (current) drug therapy: Secondary | ICD-10-CM | POA: Diagnosis not present

## 2017-01-29 ENCOUNTER — Encounter: Payer: Self-pay | Admitting: Internal Medicine

## 2017-01-29 NOTE — Assessment & Plan Note (Signed)
On crestor.  Low cholesterol diet and exercise.  Follow lipid panel and liver function tests.   

## 2017-01-29 NOTE — Assessment & Plan Note (Signed)
Stable on current regimen.  Does not feel needs any further intervention.  Follow.

## 2017-01-29 NOTE — Assessment & Plan Note (Signed)
Followed by Dr Genevive Bi now.  Just evaluated.  Recommended f/u CTA chest in one year.

## 2017-01-29 NOTE — Assessment & Plan Note (Signed)
Blood pressure under good control.  Continue same medication regimen.  Follow pressures.  Follow metabolic panel.   

## 2017-01-29 NOTE — Assessment & Plan Note (Signed)
Saw Dr Genevive Bi 12/2016.  Recommended f/u CTA chest in one year.  He is following.

## 2017-01-29 NOTE — Assessment & Plan Note (Signed)
Continue plavix.  Seeing [facility].

## 2017-01-29 NOTE — Assessment & Plan Note (Signed)
On MTX.  Followed by rheumatology.  Some chest/shoulder pain reported.  Discussed with her today.  States feels like her PMR pain.  She got off taking her MTX.  Discussed further w/up (including EKG) to evaluate the pain.  She declines.  Follow.  Take medication regularly.

## 2017-02-02 ENCOUNTER — Other Ambulatory Visit: Payer: Self-pay | Admitting: Internal Medicine

## 2017-02-04 ENCOUNTER — Ambulatory Visit (INDEPENDENT_AMBULATORY_CARE_PROVIDER_SITE_OTHER): Payer: Medicare Other

## 2017-02-04 ENCOUNTER — Encounter (INDEPENDENT_AMBULATORY_CARE_PROVIDER_SITE_OTHER): Payer: Self-pay | Admitting: Vascular Surgery

## 2017-02-04 ENCOUNTER — Ambulatory Visit (INDEPENDENT_AMBULATORY_CARE_PROVIDER_SITE_OTHER): Payer: Medicare Other | Admitting: Vascular Surgery

## 2017-02-04 VITALS — BP 130/67 | HR 15 | Resp 15 | Ht 65.0 in | Wt 145.0 lb

## 2017-02-04 DIAGNOSIS — I6523 Occlusion and stenosis of bilateral carotid arteries: Secondary | ICD-10-CM | POA: Diagnosis not present

## 2017-02-04 DIAGNOSIS — E7800 Pure hypercholesterolemia, unspecified: Secondary | ICD-10-CM | POA: Diagnosis not present

## 2017-02-04 DIAGNOSIS — G459 Transient cerebral ischemic attack, unspecified: Secondary | ICD-10-CM

## 2017-02-04 DIAGNOSIS — I712 Thoracic aortic aneurysm, without rupture: Secondary | ICD-10-CM | POA: Diagnosis not present

## 2017-02-04 DIAGNOSIS — I7121 Aneurysm of the ascending aorta, without rupture: Secondary | ICD-10-CM

## 2017-02-04 NOTE — Progress Notes (Signed)
Subjective:    [name], female    DOB: [birth year], 81 y.o.   MRN: [id number] Chief Complaint  Patient presents with  . Follow-up    Carotid f/u   Patient presents today to review vascular studies.  The patient was last seen on January 06, 2017 to establish care with our practice.  The patient was previously seen in [place] and diagnosed with carotid stenosis however is choosing to transfer her care to our practice.  The patient presents today without complaint. The patient denies experiencing Amaurosis Fugax, TIA like symptoms or focal motor deficits.  The patient underwent a bilateral carotid artery duplex exam which was notable for 1-39% stenosis of the bilateral internal carotid artery.  Antegrade vertebral artery flow bilaterally.  Normal bilateral subclavian artery waveforms.  Patient denies any fever, nausea or vomiting.   Review of Systems  Constitutional: Negative.   HENT: Negative.   Eyes: Negative.   Respiratory: Negative.   Cardiovascular: Negative.   Gastrointestinal: Negative.   Endocrine: Negative.   Genitourinary: Negative.   Musculoskeletal: Negative.   Skin: Negative.   Allergic/Immunologic: Negative.   Neurological: Negative.   Hematological: Negative.   Psychiatric/Behavioral: Negative.       Objective:   Physical Exam  Constitutional: She is oriented to person, place, and time. She appears well-developed and well-nourished. No distress.  HENT:  Head: Normocephalic and atraumatic.  Eyes: Conjunctivae are normal. Pupils are equal, round, and reactive to light.  Neck: Normal range of motion.  No carotid bruits noted on exam.  Cardiovascular: Normal rate, regular rhythm, normal heart sounds and intact distal pulses.  Pulses:      Radial pulses are 2+ on the right side, and 2+ on the left side.  Pulmonary/Chest: Effort normal and breath sounds normal.  Musculoskeletal: Normal range of motion. She exhibits no edema.  Neurological: She is alert  and oriented to person, place, and time.  Skin: Skin is warm and dry. She is not diaphoretic.  Psychiatric: She has a normal mood and affect. Her behavior is normal. Judgment and thought content normal.  Vitals reviewed.  BP 130/67 (BP Location: Left Arm, Patient Position: Sitting)   Pulse (!) 15   Resp 15   Ht 5\' 5"  (1.651 m)   Wt 145 lb (65.8 kg)   BMI 24.13 kg/m   Past Medical History:  Diagnosis Date  . Arthritis   . Cataract   . History of chicken pox   . History of kidney stones October 2013  . Hx: UTI (urinary tract infection)   . Hyperlipidemia   . Hypertension   . Rheumatoid arthritis ([place])   . Stroke [facility])    Social History   Socioeconomic History  . Marital status: Married    Spouse name: Not on file  . Number of children: Not on file  . Years of education: Not on file  . Highest education level: Not on file  Social Needs  . Financial resource strain: Not on file  . Food insecurity - worry: Not on file  . Food insecurity - inability: Not on file  . Transportation needs - medical: Not on file  . Transportation needs - non-medical: Not on file  Occupational History  . Not on file  Tobacco Use  . Smoking status: Never Smoker  . Smokeless tobacco: Never Used  Substance and Sexual Activity  . Alcohol use: No    Alcohol/week: 0.0 oz  . Drug use: No  . Sexual activity: Not  on file  Other Topics Concern  . Not on file  Social History Narrative  . Not on file   Past Surgical History:  Procedure Laterality Date  . ABDOMINAL HYSTERECTOMY    . BREAST BIOPSY Right 1995  . IR ANGIO INTRA EXTRACRAN SEL COM CAROTID INNOMINATE BILAT MOD SED  07/18/2016  . IR ANGIO VERTEBRAL SEL VERTEBRAL BILAT MOD SED  07/18/2016  . PARTIAL HYSTERECTOMY  1972  . TONSILLECTOMY AND ADENOIDECTOMY  1945   Family History  Problem Relation Age of Onset  . Asthma Mother   . Diabetes Mother   . Cataracts Mother   . Heart disease Father   . Diabetes Father   . Hypertension Father    . Cancer Paternal Aunt        Liver  . Diabetes Paternal Grandmother   . Diabetes Sister   . Retinal degeneration Sister   . Glaucoma Sister    Allergies  Allergen Reactions  . Pneumovax [Pneumococcal Polysaccharide Vaccine] Swelling  . Pneumococcal Vaccine Swelling      Assessment & Plan:  Patient presents today to review vascular studies.  The patient was last seen on January 06, 2017 to establish care with our practice.  The patient was previously seen in [place] and diagnosed with carotid stenosis however is choosing to transfer her care to our practice.  The patient presents today without complaint. The patient denies experiencing Amaurosis Fugax, TIA like symptoms or focal motor deficits.  The patient underwent a bilateral carotid artery duplex exam which was notable for 1-39% stenosis of the bilateral internal carotid artery.  Antegrade vertebral artery flow bilaterally.  Normal bilateral subclavian artery waveforms.  Patient denies any fever, nausea or vomiting.  1. Bilateral carotid artery stenosis - Stable Patient with minimal carotid artery stenosis.  1-39% bilateral ICA stenosis.  When compared to the previous angiogram there has been no change. The patient presents today asymptomatically Physical exam is unremarkable With the patient's degree of stenosis a follow-up carotid artery duplex exam in 2 years is appropriate. I have discussed with the patient at length the risk factors for and pathogenesis of atherosclerotic disease and encouraged a healthy diet, regular exercise regimen and blood pressure / glucose control.  Patient was instructed to contact our office in the interim with problems such as arm / leg weakness or numbness, speech / swallowing difficulty or temporary monocular blindness. The patient expresses their understanding.   - VAS US CAROTID; Future - VAS US CAROTID; Future  2. Ascending aortic aneurysm ([place]) - Stable Most recent CTA on December 05, 2016  notable for "the mid ascending thoracic aorta is 4.1cm. Stable dilatation of the posterior aortic arch/proximal descending thoracic aorta measuring up to 3.8 cm. Negative for an aortic dissection. Recommend yearly CTA follow-up  3. Hypercholesterolemia - Stable Encouraged good control as its slows the progression of atherosclerotic disease  Current Outpatient Medications on File Prior to Visit  Medication Sig Dispense Refill  . ALPRAZolam (XANAX) 0.5 MG tablet TAKE 1 TABLET AT BEDTIME AS NEEDED FOR ANXIETY. 30 tablet 1  . amLODipine (NORVASC) 5 MG tablet Take 1 tablet (5 mg total) by mouth daily. 90 tablet 1  . Ascorbic Acid (VITAMIN C) 1000 MG tablet Take 1,000 mg by mouth daily.     Marland Kitchen atenolol (TENORMIN) 25 MG tablet Take 1 tablet (25 mg total) by mouth daily. 30 tablet 0  . clopidogrel (PLAVIX) 75 MG tablet Take 1 tablet (75 mg total) by mouth daily. 30 tablet 0  .  Coenzyme Q10 100 MG capsule Take 100 mg by mouth daily.    . folic acid (FOLVITE) 091 MCG tablet Take 800 mcg by mouth daily.    . methotrexate (RHEUMATREX) 2.5 MG tablet Take 6 tablets per week. (Patient taking differently: Take 8 tablets per week.) 4 tablet 0  . PARoxetine (PAXIL) 20 MG tablet Take 1 tablet (20 mg total) by mouth daily. 30 tablet 0  . rosuvastatin (CRESTOR) 5 MG tablet Take 1 tablet (5 mg total) by mouth daily. 30 tablet 0   No current facility-administered medications on file prior to visit.    There are no Patient Instructions on file for this visit. No Follow-up on file.  Dajae Kizer A Krystyn Picking, PA-C

## 2017-02-10 ENCOUNTER — Other Ambulatory Visit: Payer: Self-pay | Admitting: Internal Medicine

## 2017-02-17 ENCOUNTER — Other Ambulatory Visit: Payer: Self-pay | Admitting: Internal Medicine

## 2017-02-19 DIAGNOSIS — M1990 Unspecified osteoarthritis, unspecified site: Secondary | ICD-10-CM | POA: Diagnosis not present

## 2017-02-19 DIAGNOSIS — M353 Polymyalgia rheumatica: Secondary | ICD-10-CM | POA: Diagnosis not present

## 2017-02-20 ENCOUNTER — Other Ambulatory Visit: Payer: Self-pay | Admitting: Internal Medicine

## 2017-02-20 NOTE — Telephone Encounter (Signed)
Last filled 01/26/17, #30 Okay to refill? Thanks.

## 2017-02-22 NOTE — Telephone Encounter (Signed)
rx ok'd for plavix #30 with 4 refills.

## 2017-03-04 ENCOUNTER — Other Ambulatory Visit: Payer: Self-pay | Admitting: Internal Medicine

## 2017-03-05 MED ORDER — ROSUVASTATIN CALCIUM 5 MG PO TABS: 5.0000 mg | ORAL_TABLET | Freq: Every day | ORAL | 5 refills | Status: DC

## 2017-03-05 NOTE — Telephone Encounter (Signed)
rx ok'd for xanax #30 with 1 refill.   

## 2017-03-05 NOTE — Telephone Encounter (Signed)
Okay to refill Xanax? Last refilled on 01/15/17 for 3 30 wih one refill.  LOV: 01/26/17 NOV: 05/08/17

## 2017-03-06 NOTE — Telephone Encounter (Signed)
Rx faxed to [facility]

## 2017-03-09 ENCOUNTER — Other Ambulatory Visit: Payer: Medicare Other

## 2017-03-12 ENCOUNTER — Other Ambulatory Visit: Payer: Self-pay | Admitting: Internal Medicine

## 2017-03-16 ENCOUNTER — Other Ambulatory Visit: Payer: Self-pay | Admitting: Internal Medicine

## 2017-03-23 ENCOUNTER — Other Ambulatory Visit: Payer: Self-pay | Admitting: Internal Medicine

## 2017-03-30 ENCOUNTER — Telehealth: Payer: Self-pay

## 2017-03-30 NOTE — Telephone Encounter (Signed)
Copied from [place] [phone] [phone]. Topic: General - Other >> Mar 30, 2017  1:51 PM Yvette Rack wrote: Reason for CRM: patient calling needing two more pills of the rosuvastatin (CRESTOR) 5 MG tablet   and 2 more pills on the clopidogrel (PLAVIX) 75 MG tablet patient is going out of town and is scared she will run out of medicine she just two more pills of each until she come back home

## 2017-03-31 NOTE — Telephone Encounter (Signed)
[facility] verbal to fill early and left message for patient to return call

## 2017-03-31 NOTE — Telephone Encounter (Signed)
Spoke with patient. New Beth Espinoza filled her plavix and crestor early due to her going out of town for two weeks. They would not fill her xanax without our approval. Patient has 5 refills at [facility]. Last refill was 03/05/2017. Patient is leaving this morning. OK to give verbal approval to fill early?

## 2017-03-31 NOTE — Telephone Encounter (Signed)
Ok to refill early with same directions since going out of town.

## 2017-04-08 ENCOUNTER — Other Ambulatory Visit: Payer: Self-pay | Admitting: Internal Medicine

## 2017-04-13 ENCOUNTER — Other Ambulatory Visit: Payer: Self-pay | Admitting: Internal Medicine

## 2017-04-27 ENCOUNTER — Other Ambulatory Visit: Payer: Self-pay | Admitting: Internal Medicine

## 2017-04-28 ENCOUNTER — Other Ambulatory Visit: Payer: Self-pay | Admitting: Internal Medicine

## 2017-04-28 NOTE — Telephone Encounter (Signed)
Last OV 01/26/2017 Next OV 05/08/2017 Last refill 03/05/17 with 1 refill.

## 2017-04-29 NOTE — Telephone Encounter (Signed)
faxed

## 2017-05-06 ENCOUNTER — Other Ambulatory Visit (INDEPENDENT_AMBULATORY_CARE_PROVIDER_SITE_OTHER): Payer: PPO

## 2017-05-06 DIAGNOSIS — I10 Essential (primary) hypertension: Secondary | ICD-10-CM

## 2017-05-06 DIAGNOSIS — E7800 Pure hypercholesterolemia, unspecified: Secondary | ICD-10-CM

## 2017-05-06 LAB — LIPID PANEL
CHOL/HDL RATIO: 4
Cholesterol: 191 mg/dL (ref 0–200)
HDL: 48.2 mg/dL (ref >39.00)
LDL CALC: 109 mg/dL — AB (ref 0–99)
NONHDL: 142.49
TRIGLYCERIDES: 169 mg/dL — AB (ref 0.0–149.0)
VLDL: 33.8 mg/dL (ref 0.0–40.0)

## 2017-05-06 LAB — HEPATIC FUNCTION PANEL
ALBUMIN: 4 g/dL (ref 3.5–5.2)
ALK PHOS: 66 U/L (ref 39–117)
ALT: 34 U/L (ref 0–35)
AST: 38 U/L — ABNORMAL HIGH (ref 0–37)
Bilirubin, Direct: 0.1 mg/dL (ref 0.0–0.3)
TOTAL PROTEIN: 7.2 g/dL (ref 6.0–8.3)
Total Bilirubin: 0.4 mg/dL (ref 0.2–1.2)

## 2017-05-06 LAB — BASIC METABOLIC PANEL
BUN: 13 mg/dL (ref 6–23)
CALCIUM: 10 mg/dL (ref 8.4–10.5)
CHLORIDE: 105 meq/L (ref 96–112)
CO2: 30 meq/L (ref 19–32)
Creatinine, Ser: 0.8 mg/dL (ref 0.40–1.20)
GFR: 73.41 mL/min (ref >60.00)
Glucose, Bld: 100 mg/dL — ABNORMAL HIGH (ref 70–99)
Potassium: 4.7 mEq/L (ref 3.5–5.1)
SODIUM: 141 meq/L (ref 135–145)

## 2017-05-06 LAB — TSH: TSH: 2.87 u[IU]/mL (ref 0.35–4.50)

## 2017-05-06 NOTE — Addendum Note (Signed)
Addended by: Arby Barrette on: 05/06/2017 10:01 AM   Modules accepted: Orders

## 2017-05-07 ENCOUNTER — Encounter: Payer: Self-pay | Admitting: Internal Medicine

## 2017-05-08 ENCOUNTER — Ambulatory Visit: Payer: PPO | Admitting: Internal Medicine

## 2017-05-08 ENCOUNTER — Ambulatory Visit: Payer: Medicare Other | Admitting: Internal Medicine

## 2017-05-08 DIAGNOSIS — Z0289 Encounter for other administrative examinations: Secondary | ICD-10-CM

## 2017-05-11 ENCOUNTER — Other Ambulatory Visit: Payer: Self-pay | Admitting: Internal Medicine

## 2017-05-20 NOTE — Telephone Encounter (Signed)
Unread mychart message mailed to patient 

## 2017-05-26 ENCOUNTER — Other Ambulatory Visit: Payer: Self-pay | Admitting: Internal Medicine

## 2017-05-28 NOTE — Telephone Encounter (Signed)
Last OV 01/26/2017 Next OV 08/10/2017 Last refill 04/28/2017

## 2017-05-29 ENCOUNTER — Other Ambulatory Visit: Payer: Self-pay | Admitting: Internal Medicine

## 2017-06-02 NOTE — Telephone Encounter (Signed)
faxed

## 2017-06-08 ENCOUNTER — Other Ambulatory Visit: Payer: Self-pay | Admitting: Internal Medicine

## 2017-06-18 ENCOUNTER — Other Ambulatory Visit: Payer: Self-pay | Admitting: Internal Medicine

## 2017-06-26 ENCOUNTER — Other Ambulatory Visit: Payer: Self-pay | Admitting: Internal Medicine

## 2017-06-29 ENCOUNTER — Other Ambulatory Visit: Payer: Self-pay | Admitting: Internal Medicine

## 2017-07-01 ENCOUNTER — Other Ambulatory Visit: Payer: Self-pay | Admitting: Internal Medicine

## 2017-07-02 ENCOUNTER — Other Ambulatory Visit: Payer: Self-pay | Admitting: Internal Medicine

## 2017-07-02 ENCOUNTER — Telehealth (HOSPITAL_COMMUNITY): Payer: Self-pay

## 2017-07-02 NOTE — Telephone Encounter (Signed)
Called to schedule 1 yr f/u mri, no answer, left vm. AW

## 2017-07-05 ENCOUNTER — Other Ambulatory Visit: Payer: Self-pay | Admitting: Internal Medicine

## 2017-07-28 ENCOUNTER — Other Ambulatory Visit: Payer: Self-pay | Admitting: Internal Medicine

## 2017-07-29 ENCOUNTER — Encounter: Payer: Self-pay | Admitting: Internal Medicine

## 2017-07-29 ENCOUNTER — Other Ambulatory Visit: Payer: Self-pay | Admitting: Internal Medicine

## 2017-07-29 NOTE — Telephone Encounter (Signed)
Last OV 01/26/2017 Next OV 08/10/17 Last refill 06/26/17

## 2017-08-03 ENCOUNTER — Other Ambulatory Visit: Payer: Self-pay | Admitting: Internal Medicine

## 2017-08-10 ENCOUNTER — Ambulatory Visit: Payer: PPO | Admitting: Internal Medicine

## 2017-08-10 ENCOUNTER — Encounter

## 2017-08-11 ENCOUNTER — Encounter: Payer: Self-pay | Admitting: Internal Medicine

## 2017-08-11 ENCOUNTER — Ambulatory Visit (INDEPENDENT_AMBULATORY_CARE_PROVIDER_SITE_OTHER): Payer: PPO | Admitting: Internal Medicine

## 2017-08-11 DIAGNOSIS — W19XXXA Unspecified fall, initial encounter: Secondary | ICD-10-CM

## 2017-08-11 DIAGNOSIS — F419 Anxiety disorder, unspecified: Secondary | ICD-10-CM | POA: Diagnosis not present

## 2017-08-11 DIAGNOSIS — I6523 Occlusion and stenosis of bilateral carotid arteries: Secondary | ICD-10-CM | POA: Diagnosis not present

## 2017-08-11 DIAGNOSIS — I712 Thoracic aortic aneurysm, without rupture: Secondary | ICD-10-CM

## 2017-08-11 DIAGNOSIS — E7800 Pure hypercholesterolemia, unspecified: Secondary | ICD-10-CM | POA: Diagnosis not present

## 2017-08-11 DIAGNOSIS — I10 Essential (primary) hypertension: Secondary | ICD-10-CM | POA: Diagnosis not present

## 2017-08-11 DIAGNOSIS — M353 Polymyalgia rheumatica: Secondary | ICD-10-CM

## 2017-08-11 DIAGNOSIS — I7121 Aneurysm of the ascending aorta, without rupture: Secondary | ICD-10-CM

## 2017-08-11 MED ORDER — PAROXETINE HCL 20 MG PO TABS: 20.0000 mg | ORAL_TABLET | Freq: Every day | ORAL | 5 refills | Status: DC

## 2017-08-11 MED ORDER — ROSUVASTATIN CALCIUM 5 MG PO TABS: 5.0000 mg | ORAL_TABLET | Freq: Every day | ORAL | 5 refills | Status: DC

## 2017-08-11 MED ORDER — MUPIROCIN 2 % EX OINT: TOPICAL_OINTMENT | CUTANEOUS | 0 refills | Status: DC

## 2017-08-11 NOTE — Progress Notes (Signed)
[name], female   DOB: 07/20/1935, 82 y.o.   MRN: [id number]   Subjective:    [name], female    DOB: 1935-10-12, 82 y.o.   MRN: [id number]  HPI  Patient here for a scheduled follow up.  [place] recently.  Left arm laceration.  Healing.  States she was pulling up weeds.  Had on flip flops.  Fell.  Left hand bruised and fell on left knee.  No significant pain.  No pain with walking.  No head injury.  Stays active.  No chest pain.  No sob.  No acid reflux.  No abdominal pain.  Bowels moving.  Overall she feels she is doing well.  Saw vascular surgery for f/u carotids 12.2018.  Stable.  Recommended f/u in 2 years.  Also saw Dr Jefm Bryant 01/2017.  Stable on MTX.     Past Medical History:  Diagnosis Date  . Arthritis   . Cataract   . History of chicken pox   . History of kidney stones October 2013  . Hx: UTI (urinary tract infection)   . Hyperlipidemia   . Hypertension   . Rheumatoid arthritis ([place])   . Stroke [facility])    Past Surgical History:  Procedure Laterality Date  . ABDOMINAL HYSTERECTOMY    . BREAST BIOPSY Right 1995  . IR ANGIO INTRA EXTRACRAN SEL COM CAROTID INNOMINATE BILAT MOD SED  07/18/2016  . IR ANGIO VERTEBRAL SEL VERTEBRAL BILAT MOD SED  07/18/2016  . PARTIAL HYSTERECTOMY  1972  . TONSILLECTOMY AND ADENOIDECTOMY  1945   Family History  Problem Relation Age of Onset  . Asthma Mother   . Diabetes Mother   . Cataracts Mother   . Heart disease Father   . Diabetes Father   . Hypertension Father   . Cancer Paternal Aunt        Liver  . Diabetes Paternal Grandmother   . Diabetes Sister   . Retinal degeneration Sister   . Glaucoma Sister    Social History   Socioeconomic History  . Marital status: Married    Spouse name: Not on file  . Number of children: Not on file  . Years of education: Not on file  . Highest education level: Not on file  Occupational History  . Not on file  Social Needs  . Financial resource strain: Not on file  . Food  insecurity:    Worry: Not on file    Inability: Not on file  . Transportation needs:    Medical: Not on file    Non-medical: Not on file  Tobacco Use  . Smoking status: Never Smoker  . Smokeless tobacco: Never Used  Substance and Sexual Activity  . Alcohol use: No    Alcohol/week: 0.0 oz  . Drug use: No  . Sexual activity: Not on file  Lifestyle  . Physical activity:    Days per week: Not on file    Minutes per session: Not on file  . Stress: Not on file  Relationships  . Social connections:    Talks on phone: Not on file    Gets together: Not on file    Attends religious service: Not on file    Active member of club or organization: Not on file    Attends meetings of clubs or organizations: Not on file    Relationship status: Not on file  Other Topics Concern  . Not on file  Social History Narrative  . Not on file  Outpatient Encounter Medications as of 08/11/2017  Medication Sig  . ALPRAZolam (XANAX) 0.5 MG tablet TAKE 1 TABLET AT BEDTIME AS NEEDED FOR ANXIETY.  Marland Kitchen amLODipine (NORVASC) 5 MG tablet Take 1 tablet (5 mg total) by mouth daily.  . Ascorbic Acid (VITAMIN C) 1000 MG tablet Take 1,000 mg by mouth daily.   Marland Kitchen atenolol (TENORMIN) 25 MG tablet Take 1 tablet (25 mg total) by mouth daily. Must keep appt in June for additional refills  . clopidogrel (PLAVIX) 75 MG tablet Take 1 tablet (75 mg total) by mouth daily.  . Coenzyme Q10 100 MG capsule Take 100 mg by mouth daily.  . folic acid (FOLVITE) 062 MCG tablet Take 800 mcg by mouth daily.  . methotrexate (RHEUMATREX) 2.5 MG tablet Take 6 tablets per week. (Patient taking differently: Take 8 tablets per week.)  . PARoxetine (PAXIL) 20 MG tablet Take 1 tablet (20 mg total) by mouth daily.  . rosuvastatin (CRESTOR) 5 MG tablet Take 1 tablet (5 mg total) by mouth daily.  . [DISCONTINUED] PARoxetine (PAXIL) 20 MG tablet Take 1 tablet (20 mg total) by mouth daily.  . [DISCONTINUED] rosuvastatin (CRESTOR) 5 MG tablet Take 1  tablet (5 mg total) by mouth daily.  . mupirocin ointment (BACTROBAN) 2 % Apply to affected area bid   No facility-administered encounter medications on file as of 08/11/2017.     Review of Systems  Constitutional: Negative for appetite change and unexpected weight change.  HENT: Negative for congestion and sinus pressure.   Respiratory: Negative for cough, chest tightness and shortness of breath.   Cardiovascular: Negative for chest pain, palpitations and leg swelling.  Gastrointestinal: Negative for abdominal pain, diarrhea, nausea and vomiting.  Genitourinary: Negative for difficulty urinating and dysuria.  Musculoskeletal: Negative for joint swelling and myalgias.  Skin: Negative for color change and rash.       Arm laceration as outlined.    Neurological: Negative for dizziness, light-headedness and headaches.  Psychiatric/Behavioral: Negative for agitation and dysphoric mood.       Objective:     Blood pressure rechecked by me:  134/74  Physical Exam  Constitutional: She appears well-developed and well-nourished. No distress.  HENT:  Nose: Nose normal.  Mouth/Throat: Oropharynx is clear and moist.  Neck: Neck supple. No thyromegaly present.  Cardiovascular: Normal rate and regular rhythm.  Pulmonary/Chest: Breath sounds normal. No respiratory distress. She has no wheezes.  Abdominal: Soft. Bowel sounds are normal. There is no tenderness.  Musculoskeletal: She exhibits no edema or tenderness.  Lymphadenopathy:    She has no cervical adenopathy.  Skin: No rash noted. No erythema.  Psychiatric: She has a normal mood and affect. Her behavior is normal.    BP 124/70 (BP Location: Left Arm, Patient Position: Sitting, Cuff Size: Normal)   Pulse 81   Temp 98.2 F (36.8 C) (Oral)   Resp 18   Wt 146 lb (66.2 kg)   SpO2 98%   BMI 24.30 kg/m  Wt Readings from Last 3 Encounters:  08/11/17 146 lb (66.2 kg)  02/04/17 145 lb (65.8 kg)  01/26/17 151 lb (68.5 kg)     Lab  Results  Component Value Date   WBC 8.1 12/17/2016   HGB 14.1 12/17/2016   HCT 43.4 12/17/2016   PLT 346.0 12/17/2016   GLUCOSE 100 (H) 05/06/2017   CHOL 191 05/06/2017   TRIG 169.0 (H) 05/06/2017   HDL 48.20 05/06/2017   LDLDIRECT 137.0 10/17/2014   LDLCALC 109 (H) 05/06/2017  ALT 34 05/06/2017   AST 38 (H) 05/06/2017   NA 141 05/06/2017   K 4.7 05/06/2017   CL 105 05/06/2017   CREATININE 0.80 05/06/2017   BUN 13 05/06/2017   CO2 30 05/06/2017   TSH 2.87 05/06/2017   INR 1.07 07/18/2016    US Thyroid  Result Date: 12/22/2016 CLINICAL DATA:  Incidental on CT. 82 year old female with thyroid nodule seen on prior CT scan EXAM: THYROID ULTRASOUND TECHNIQUE: Ultrasound examination of the thyroid gland and adjacent soft tissues was performed. COMPARISON:  CT scan of the chest 12/05/2016 FINDINGS: Parenchymal Echotexture: Moderately heterogenous Isthmus: 0.4 cm Right lobe: 4.5 x 1.4 x 1.3 cm Left lobe: 4.2 x 1.4 x 1.0 cm _________________________________________________________ Estimated total number of nodules >/= 1 cm: 1 Number of spongiform nodules >/=  2 cm not described below (TR1): 0 Number of mixed cystic and solid nodules >/= 1.5 cm not described below (TR2): 0 _________________________________________________________ Nodule # 1: Location: Right; Superior Maximum size: 1.3 cm; Other 2 dimensions: 1.1 x 0.7 cm Composition: solid/almost completely solid (2) Echogenicity: isoechoic (1) Shape: not taller-than-wide (0) Margins: ill-defined (0) Echogenic foci: none (0) ACR TI-RADS total points: 3. ACR TI-RADS risk category: TR3 (3 points). ACR TI-RADS recommendations: Given size (<1.4 cm) and appearance, this nodule does NOT meet TI-RADS criteria for biopsy or dedicated follow-up. _________________________________________________________ Diffusely heterogeneous thyroid gland with innumerable small bilateral thyroid nodules. The nodules do not meet criteria for further evaluation. IMPRESSION:  Heterogeneous and mildly enlarged multinodular thyroid gland. The nodules do not meet criteria for further evaluation. The above is in keeping with the ACR TI-RADS recommendations - J Am Coll Radiol 2017;14:587-595. Electronically Signed   By: Jacqulynn Cadet M.D.   On: 12/22/2016 14:53       Assessment & Plan:   Problem List Items Addressed This Visit    Anxiety    Stable on current regimen.  Follow.  Does not feel needs any further intervention.        Relevant Medications   PARoxetine (PAXIL) 20 MG tablet   Ascending aortic aneurysm ([place])    Saw Dr Genevive Bi 12/2016.  Recommended f/u CTA chest in one year.  He is following.        Relevant Medications   rosuvastatin (CRESTOR) 5 MG tablet   Carotid stenosis    Continue plavix.  Evaluated by [facility].  Recommended f/u in two years.        Relevant Medications   rosuvastatin (CRESTOR) 5 MG tablet   Essential hypertension    Blood pressure under good control.  Continue same medication regimen.  Follow pressures.  Follow metabolic panel.        Relevant Medications   rosuvastatin (CRESTOR) 5 MG tablet   Fall    No significant pain.  Walking with no problems.  Arm laceration healing.  Bactroban.  Follow.        Hypercholesterolemia    On crestor.  Low cholesterol diet and exercise.  Follow lipid panel and liver function tests.        Relevant Medications   rosuvastatin (CRESTOR) 5 MG tablet   Other Relevant Orders   Hepatic function panel   Lipid panel   Basic metabolic panel   PMR (polymyalgia rheumatica) ([facility])    On MTX.  Followed by rheumatology.  Stable.  Last evaluated 01/2017.            [name], MD

## 2017-08-19 ENCOUNTER — Encounter: Payer: Self-pay | Admitting: Internal Medicine

## 2017-08-19 DIAGNOSIS — W19XXXA Unspecified fall, initial encounter: Secondary | ICD-10-CM | POA: Insufficient documentation

## 2017-08-19 NOTE — Assessment & Plan Note (Signed)
Saw Dr Genevive Bi 12/2016.  Recommended f/u CTA chest in one year.  He is following.

## 2017-08-19 NOTE — Assessment & Plan Note (Signed)
Blood pressure under good control.  Continue same medication regimen.  Follow pressures.  Follow metabolic panel.   

## 2017-08-19 NOTE — Assessment & Plan Note (Signed)
Continue plavix.  Evaluated by [facility].  Recommended f/u in two years.

## 2017-08-19 NOTE — Assessment & Plan Note (Signed)
No significant pain.  Walking with no problems.  Arm laceration healing.  Bactroban.  Follow.

## 2017-08-19 NOTE — Assessment & Plan Note (Signed)
Stable on current regimen.  Follow.  Does not feel needs any further intervention.

## 2017-08-19 NOTE — Assessment & Plan Note (Signed)
On MTX.  Followed by rheumatology.  Stable.  Last evaluated 01/2017.

## 2017-08-19 NOTE — Assessment & Plan Note (Signed)
On crestor.  Low cholesterol diet and exercise.  Follow lipid panel and liver function tests.   

## 2017-08-24 DIAGNOSIS — M353 Polymyalgia rheumatica: Secondary | ICD-10-CM | POA: Diagnosis not present

## 2017-08-24 DIAGNOSIS — M1990 Unspecified osteoarthritis, unspecified site: Secondary | ICD-10-CM | POA: Diagnosis not present

## 2017-08-26 ENCOUNTER — Other Ambulatory Visit: Payer: Self-pay | Admitting: Internal Medicine

## 2017-08-27 NOTE — Telephone Encounter (Signed)
Last OV 08/11/2017  Next OV 12/14/2017 Last refill 07/29/17

## 2017-08-28 NOTE — Telephone Encounter (Signed)
rx ok'd for xanax #30 with one refill  

## 2017-08-31 ENCOUNTER — Other Ambulatory Visit: Payer: Self-pay | Admitting: Internal Medicine

## 2017-09-08 ENCOUNTER — Other Ambulatory Visit: Payer: PPO

## 2017-09-14 ENCOUNTER — Other Ambulatory Visit: Payer: Self-pay | Admitting: Internal Medicine

## 2017-10-05 ENCOUNTER — Ambulatory Visit: Payer: Self-pay | Admitting: Internal Medicine

## 2017-10-05 ENCOUNTER — Encounter: Payer: Self-pay | Admitting: Internal Medicine

## 2017-10-05 NOTE — Telephone Encounter (Signed)
Increased memory loss- lost wallet, not locking doors, fecal accidents and dressing inappropriately.Patient has recently moved in with her daughter- [name]. Doni has noticed that her mother is much worse that expected- she has exhibited a lot of memory issues and she is concerned about her mental state. She thinks she may need testing and medication. She would like to bring her in to see Dr Nicki Reaper for evaluation. Best contact 3 [phone].  Reason for Disposition . New or worsening falling  Answer Assessment - Initial Assessment Questions 1. MAIN CONCERN OR SYMPTOM:  "What is your main concern right now?" "What questions do you have?" "What's the main symptom you're worried about?" (e.g., confusion, memory loss)     Ability remember things- short term memory- inappropriate actions  2. ONSET:  "When did the symptom start (or worsen)?" (minutes, hours, days, weeks)     Impairment for 1-2 years- debilitating - very forgetful 3. BETTER-SAME-WORSE: "Are you getting better, staying the same, or getting worse compared to the day you were discharged?" Daughter thinks her mother is much worse than expected- she did not have access to her like she does now and she is seeing a lot more concerning things 4.  DIAGNOSIS: "Was patient diagnosed with dementia by a doctor?" If yes, "When?" (e.g., days, months, years ago) patient has not been diagnosed- she needs to be tested 5.  MEDICATION: "Has there been any change in medications recently?" (e.g., narcotics, antihistamines, benzodiazepines, etc.) no changes- she may forget to take them 6.  OTHER SYMPTOMS: "Are there any other symptoms?" (e.g., fever, cough, pain, falling)     She has been injuring herself a lot- tripping, cutting herself- all accidents 7. SUPPORT: Document living circumstances and support (e.g., family, nursing home)     She lives with daughter- daughter seems somewhat frustrated with her mother at this time.  Protocols used: DEMENTIA SYMPTOMS AND  QUESTIONS-A-AH

## 2017-10-05 NOTE — Telephone Encounter (Signed)
Pt scheduled for thurs at 4:00. Patients daughter requested appt later in the day. This is just an FYI for you.

## 2017-10-05 NOTE — Telephone Encounter (Signed)
Ok

## 2017-10-05 NOTE — Telephone Encounter (Signed)
Sent a message earlier about getting patient scheduled daughter called back and spoke with nurse Triage. Daughter is very concerned and is wanting her mother to be seen soon , only comfortable with Dr. Nicki Reaper.

## 2017-10-05 NOTE — Telephone Encounter (Signed)
Would you like for me to schedule pt at 9:30 tomorrow?

## 2017-10-08 ENCOUNTER — Ambulatory Visit: Payer: PPO | Admitting: Internal Medicine

## 2017-10-21 ENCOUNTER — Encounter: Payer: Self-pay | Admitting: Internal Medicine

## 2017-10-22 ENCOUNTER — Other Ambulatory Visit: Payer: Self-pay | Admitting: Internal Medicine

## 2017-10-22 NOTE — Telephone Encounter (Signed)
Patients daughter stated that she would like to discuss this with you at her upcoming appt. Her appt on 10/14 is for her physical. She thinks that it is going to be hard for her to convince her mom to come in for 2 separate appts. She says that she doesn't think it is something urgent that needs to be addressed right now but would like to bring up at her appt. Also she does not think that seeing neurology prior to seeing you would not work so she would like to wait on that. She was also wondering if patients methotrexate could be causing some of her memory issues. I have advised that you are out of the office.

## 2017-10-22 NOTE — Telephone Encounter (Signed)
Please call Doni and let her know that we received/reviewed message.  If needs earlier appt (currently scheduled for 12/14/17) - can schedule.  I can also place order for referral to neurology as well.

## 2017-10-23 ENCOUNTER — Telehealth: Payer: Self-pay | Admitting: Internal Medicine

## 2017-10-23 NOTE — Telephone Encounter (Signed)
Last OV 08/11/17 Next OV 12/14/17 Last refill 08/28/17 with 1 refill

## 2017-10-23 NOTE — Telephone Encounter (Unsigned)
Copied from [place] [phone]. Topic: Quick Communication - Rx Refill/Question >> Oct 23, 2017 12:45 PM Judyann Munson wrote: Medication: amLODipine (NORVASC) 5 MG tablet, atenolol (TENORMIN) 25 MG tablet, clopidogrel (PLAVIX) 75 MG tablet, PARoxetine (PAXIL) 20 MG tablet, rosuvastatin (CRESTOR) 5 MG tablet, ALPRAZolam (XANAX) 0.5 MG tablet Has the patient contacted their pharmacy?yes   Preferred Pharmacy (with phone number or street name):[facility] [place], [place] [phone] (Phone) [phone] (Fax)

## 2017-10-23 NOTE — Telephone Encounter (Signed)
I do not think the methotrexate is contributing to her memory issues.

## 2017-11-05 ENCOUNTER — Ambulatory Visit: Payer: PPO | Admitting: Internal Medicine

## 2017-11-17 ENCOUNTER — Ambulatory Visit: Payer: PPO | Admitting: Internal Medicine

## 2017-11-17 ENCOUNTER — Other Ambulatory Visit: Payer: Self-pay | Admitting: Internal Medicine

## 2017-11-18 ENCOUNTER — Other Ambulatory Visit: Payer: Self-pay | Admitting: Internal Medicine

## 2017-11-19 ENCOUNTER — Other Ambulatory Visit: Payer: Self-pay | Admitting: Internal Medicine

## 2017-11-19 NOTE — Telephone Encounter (Signed)
Last OV 08/11/2017   Last refilled 10/23/2017 disp 30 with no refills   Sent to PCP to advise

## 2017-12-03 ENCOUNTER — Other Ambulatory Visit: Payer: Self-pay | Admitting: Internal Medicine

## 2017-12-14 ENCOUNTER — Ambulatory Visit (INDEPENDENT_AMBULATORY_CARE_PROVIDER_SITE_OTHER): Payer: PPO | Admitting: Internal Medicine

## 2017-12-14 ENCOUNTER — Encounter: Payer: Self-pay | Admitting: Internal Medicine

## 2017-12-14 DIAGNOSIS — I10 Essential (primary) hypertension: Secondary | ICD-10-CM

## 2017-12-14 DIAGNOSIS — F419 Anxiety disorder, unspecified: Secondary | ICD-10-CM | POA: Diagnosis not present

## 2017-12-14 DIAGNOSIS — I7121 Aneurysm of the ascending aorta, without rupture: Secondary | ICD-10-CM

## 2017-12-14 DIAGNOSIS — E7800 Pure hypercholesterolemia, unspecified: Secondary | ICD-10-CM | POA: Diagnosis not present

## 2017-12-14 DIAGNOSIS — I712 Thoracic aortic aneurysm, without rupture: Secondary | ICD-10-CM

## 2017-12-14 DIAGNOSIS — M353 Polymyalgia rheumatica: Secondary | ICD-10-CM

## 2017-12-14 DIAGNOSIS — G479 Sleep disorder, unspecified: Secondary | ICD-10-CM | POA: Diagnosis not present

## 2017-12-14 DIAGNOSIS — I6523 Occlusion and stenosis of bilateral carotid arteries: Secondary | ICD-10-CM | POA: Diagnosis not present

## 2017-12-14 NOTE — Progress Notes (Signed)
[name], female   DOB: 1935/10/10, 82 y.o.   MRN: [id number]   Subjective:    [name], female    DOB: 01-07-1936, 82 y.o.   MRN: [id number]  HPI  Patient here for a scheduled physical.  Given issues, changed to a follow up appt.  She is accompanied by her daughter.  History obtained from both of them.  She has moved in with her daughter.  Husband moved in as well.  Increased stress since he moved in.  Increased stress with their relationship.  No physical abuse.  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  On paxil.  Taking regularly.  Takes xanax at night to help her sleep.  No chest pain.  No sob.  No acid reflux.  No abdominal pain.  Bowels moving.  Has lost weight.  States she has adjusted her diet.  Watching carbs.  Eating better since moving in with her daughter.  Daughter had sent messages concerned about her mother's memory.  States she feels a lot of this was related to increased stress.     Past Medical History:  Diagnosis Date  . Arthritis   . Cataract   . History of chicken pox   . History of kidney stones October 2013  . Hx: UTI (urinary tract infection)   . Hyperlipidemia   . Hypertension   . Rheumatoid arthritis ([place])   . Stroke [facility])    Past Surgical History:  Procedure Laterality Date  . ABDOMINAL HYSTERECTOMY    . BREAST BIOPSY Right 1995  . IR ANGIO INTRA EXTRACRAN SEL COM CAROTID INNOMINATE BILAT MOD SED  07/18/2016  . IR ANGIO VERTEBRAL SEL VERTEBRAL BILAT MOD SED  07/18/2016  . PARTIAL HYSTERECTOMY  1972  . TONSILLECTOMY AND ADENOIDECTOMY  1945   Family History  Problem Relation Age of Onset  . Asthma Mother   . Diabetes Mother   . Cataracts Mother   . Heart disease Father   . Diabetes Father   . Hypertension Father   . Cancer Paternal Aunt        Liver  . Diabetes Paternal Grandmother   . Diabetes Sister   . Retinal degeneration Sister   . Glaucoma Sister    Social History   Socioeconomic  History  . Marital status: Married    Spouse name: Not on file  . Number of children: Not on file  . Years of education: Not on file  . Highest education level: Not on file  Occupational History  . Not on file  Social Needs  . Financial resource strain: Not on file  . Food insecurity:    Worry: Not on file    Inability: Not on file  . Transportation needs:    Medical: Not on file    Non-medical: Not on file  Tobacco Use  . Smoking status: Never Smoker  . Smokeless tobacco: Never Used  Substance and Sexual Activity  . Alcohol use: No    Alcohol/week: 0.0 standard drinks  . Drug use: No  . Sexual activity: Not on file  Lifestyle  . Physical activity:    Days per week: Not on file    Minutes per session: Not on file  . Stress: Not on file  Relationships  . Social connections:    Talks on phone: Not on file    Gets together: Not on file    Attends religious service: Not on file  Active member of club or organization: Not on file    Attends meetings of clubs or organizations: Not on file    Relationship status: Not on file  Other Topics Concern  . Not on file  Social History Narrative  . Not on file    Outpatient Encounter Medications as of 12/14/2017  Medication Sig  . Ascorbic Acid (VITAMIN C) 1000 MG tablet Take 1,000 mg by mouth daily.   Marland Kitchen atenolol (TENORMIN) 25 MG tablet Take 1 tablet (25 mg total) by mouth daily. Must keep appt in June for additional refills  . Coenzyme Q10 100 MG capsule Take 100 mg by mouth daily.  . folic acid (FOLVITE) 654 MCG tablet Take 800 mcg by mouth daily.  . methotrexate (RHEUMATREX) 2.5 MG tablet Take 6 tablets per week. (Patient taking differently: Take 8 tablets per week.)  . mupirocin ointment (BACTROBAN) 2 % Apply to affected area bid  . PARoxetine (PAXIL) 20 MG tablet Take 1 tablet (20 mg total) by mouth daily.  . rosuvastatin (CRESTOR) 5 MG tablet Take 1 tablet (5 mg total) by mouth daily.  . [DISCONTINUED] ALPRAZolam (XANAX)  0.5 MG tablet TAKE 1 TABLET AT BEDTIME IF NEEDED FOR ANXIETY  . [DISCONTINUED] amLODipine (NORVASC) 5 MG tablet Take 1 tablet (5 mg total) by mouth daily.  . [DISCONTINUED] clopidogrel (PLAVIX) 75 MG tablet Take 1 tablet (75 mg total) by mouth daily.   No facility-administered encounter medications on file as of 12/14/2017.     Review of Systems  Constitutional: Negative for appetite change.       Eating better.  Has lost weight.    HENT: Negative for congestion and sinus pressure.   Respiratory: Negative for cough, chest tightness and shortness of breath.   Cardiovascular: Negative for chest pain, palpitations and leg swelling.  Gastrointestinal: Negative for abdominal pain, diarrhea, nausea and vomiting.  Genitourinary: Negative for difficulty urinating and dysuria.  Musculoskeletal: Negative for joint swelling and myalgias.  Skin: Negative for color change and rash.  Neurological: Negative for dizziness, light-headedness and headaches.  Psychiatric/Behavioral: Negative for agitation and dysphoric mood.       Objective:    Physical Exam  Constitutional: She appears well-developed and well-nourished. No distress.  HENT:  Nose: Nose normal.  Mouth/Throat: Oropharynx is clear and moist.  Neck: Neck supple. No thyromegaly present.  Cardiovascular: Normal rate and regular rhythm.  Pulmonary/Chest: Breath sounds normal. No respiratory distress. She has no wheezes.  Abdominal: Soft. Bowel sounds are normal. There is no tenderness.  Musculoskeletal: She exhibits no edema or tenderness.  Lymphadenopathy:    She has no cervical adenopathy.  Skin: No rash noted. No erythema.  Psychiatric: She has a normal mood and affect. Her behavior is normal.    BP (!) 148/80   Pulse 72   Temp (!) 97.5 F (36.4 C) (Oral)   Ht 5\' 5"  (1.651 m)   Wt 133 lb 12.8 oz (60.7 kg)   SpO2 94%   BMI 22.27 kg/m  Wt Readings from Last 3 Encounters:  12/14/17 133 lb 12.8 oz (60.7 kg)  08/11/17 146 lb  (66.2 kg)  02/04/17 145 lb (65.8 kg)     Lab Results  Component Value Date   WBC 8.1 12/17/2016   HGB 14.1 12/17/2016   HCT 43.4 12/17/2016   PLT 346.0 12/17/2016   GLUCOSE 100 (H) 05/06/2017   CHOL 191 05/06/2017   TRIG 169.0 (H) 05/06/2017   HDL 48.20 05/06/2017   LDLDIRECT 137.0 10/17/2014  LDLCALC 109 (H) 05/06/2017   ALT 34 05/06/2017   AST 38 (H) 05/06/2017   NA 141 05/06/2017   K 4.7 05/06/2017   CL 105 05/06/2017   CREATININE 0.80 05/06/2017   BUN 13 05/06/2017   CO2 30 05/06/2017   TSH 2.87 05/06/2017   INR 1.07 07/18/2016    US Thyroid  Result Date: 12/22/2016 CLINICAL DATA:  Incidental on CT. 82 year old female with thyroid nodule seen on prior CT scan EXAM: THYROID ULTRASOUND TECHNIQUE: Ultrasound examination of the thyroid gland and adjacent soft tissues was performed. COMPARISON:  CT scan of the chest 12/05/2016 FINDINGS: Parenchymal Echotexture: Moderately heterogenous Isthmus: 0.4 cm Right lobe: 4.5 x 1.4 x 1.3 cm Left lobe: 4.2 x 1.4 x 1.0 cm _________________________________________________________ Estimated total number of nodules >/= 1 cm: 1 Number of spongiform nodules >/=  2 cm not described below (TR1): 0 Number of mixed cystic and solid nodules >/= 1.5 cm not described below (TR2): 0 _________________________________________________________ Nodule # 1: Location: Right; Superior Maximum size: 1.3 cm; Other 2 dimensions: 1.1 x 0.7 cm Composition: solid/almost completely solid (2) Echogenicity: isoechoic (1) Shape: not taller-than-wide (0) Margins: ill-defined (0) Echogenic foci: none (0) ACR TI-RADS total points: 3. ACR TI-RADS risk category: TR3 (3 points). ACR TI-RADS recommendations: Given size (<1.4 cm) and appearance, this nodule does NOT meet TI-RADS criteria for biopsy or dedicated follow-up. _________________________________________________________ Diffusely heterogeneous thyroid gland with innumerable small bilateral thyroid nodules. The nodules do not  meet criteria for further evaluation. IMPRESSION: Heterogeneous and mildly enlarged multinodular thyroid gland. The nodules do not meet criteria for further evaluation. The above is in keeping with the ACR TI-RADS recommendations - J Am Coll Radiol 2017;14:587-595. Electronically Signed   By: Jacqulynn Cadet M.D.   On: 12/22/2016 14:53       Assessment & Plan:   Problem List Items Addressed This Visit    Anxiety    On paxil.  Taking xanax to help her sleep.  Does not feel needs anything more at this time.  Follow.        Ascending aortic aneurysm (HCC)    Saw Dr Genevive Bi.  Recommended f/u CTA chest in one year.        Relevant Orders   Ambulatory referral to General Surgery   Carotid stenosis    Evaluated by [facility].  Continue plavix.  Recommended f/u in 2 years from last check.        Difficulty sleeping    Takes xanax to help sleep.  Works well for her.        Essential hypertension    Blood pressure has been under good control.  slightly elevated today.  Continue same medication regimen.  Follow pressures.  Follow metabolic panel.        Hypercholesterolemia    On crestor.  Low cholesterol diet and exercise.  Follow lipid panel and liver function tests.        PMR (polymyalgia rheumatica) (HCC)    Followed by rheumatology.  On MTX.  Stable.            [name], MD

## 2017-12-14 NOTE — Progress Notes (Signed)
Pre visit review using our clinic review tool, if applicable. No additional management support is needed unless otherwise documented below in the visit note. 

## 2017-12-15 ENCOUNTER — Other Ambulatory Visit: Payer: Self-pay | Admitting: Internal Medicine

## 2017-12-16 ENCOUNTER — Other Ambulatory Visit: Payer: Self-pay | Admitting: Internal Medicine

## 2017-12-17 ENCOUNTER — Other Ambulatory Visit: Payer: Self-pay | Admitting: Internal Medicine

## 2017-12-18 NOTE — Telephone Encounter (Signed)
rx ok'd for xanax #30 with no refills.   

## 2017-12-18 NOTE — Telephone Encounter (Signed)
Last OV 12/14/2017 Next OV 04/16/2018  Last refill 11/19/2017

## 2017-12-21 ENCOUNTER — Encounter: Payer: Self-pay | Admitting: Internal Medicine

## 2017-12-21 NOTE — Assessment & Plan Note (Signed)
Saw Dr Genevive Bi.  Recommended f/u CTA chest in one year.

## 2017-12-21 NOTE — Assessment & Plan Note (Signed)
On crestor.  Low cholesterol diet and exercise.  Follow lipid panel and liver function tests.   

## 2017-12-21 NOTE — Assessment & Plan Note (Signed)
Evaluated by [facility].  Continue plavix.  Recommended f/u in 2 years from last check.

## 2017-12-21 NOTE — Assessment & Plan Note (Signed)
Followed by rheumatology.  On MTX.  Stable.   

## 2017-12-21 NOTE — Assessment & Plan Note (Signed)
Takes xanax to help sleep.  Works well for her.

## 2017-12-21 NOTE — Assessment & Plan Note (Signed)
Blood pressure has been under good control.  slightly elevated today.  Continue same medication regimen.  Follow pressures.  Follow metabolic panel.

## 2017-12-21 NOTE — Assessment & Plan Note (Signed)
On paxil.  Taking xanax to help her sleep.  Does not feel needs anything more at this time.  Follow.

## 2017-12-23 ENCOUNTER — Other Ambulatory Visit: Payer: Self-pay

## 2017-12-23 DIAGNOSIS — I712 Thoracic aortic aneurysm, without rupture, unspecified: Secondary | ICD-10-CM

## 2017-12-23 DIAGNOSIS — R911 Solitary pulmonary nodule: Secondary | ICD-10-CM

## 2017-12-31 ENCOUNTER — Ambulatory Visit (INDEPENDENT_AMBULATORY_CARE_PROVIDER_SITE_OTHER): Payer: PPO

## 2017-12-31 VITALS — BP 132/72 | HR 64 | Temp 97.2°F | Resp 14 | Ht 65.0 in | Wt 133.8 lb

## 2017-12-31 DIAGNOSIS — Z0000 Encounter for general adult medical examination without abnormal findings: Secondary | ICD-10-CM | POA: Diagnosis not present

## 2017-12-31 DIAGNOSIS — Z23 Encounter for immunization: Secondary | ICD-10-CM

## 2017-12-31 NOTE — Progress Notes (Signed)
Subjective:   Beth Espinoza is a 82 y.o. female who presents for Medicare Annual (Subsequent) preventive examination.  Review of Systems:  No ROS.  Medicare Wellness Visit. Additional risk factors are reflected in the social history. Cardiac Risk Factors include: advanced age (>90men, >46 women);hypertension     Objective:     Vitals: BP 132/72 (BP Location: Left Arm, Patient Position: Sitting, Cuff Size: Normal)   Pulse 64   Temp (!) 97.2 F (36.2 C) (Oral)   Resp 14   Ht 5\' 5"  (1.651 m)   Wt 133 lb 12.8 oz (60.7 kg)   SpO2 97%   BMI 22.27 kg/m   Body mass index is 22.27 kg/m.  Advanced Directives 12/31/2017 12/30/2016 12/03/2016 08/14/2015 07/20/2015 07/19/2015  Does Patient Have a Medical Advance Directive? Yes Yes Yes No No No  Type of [place] Power of Attorney Living will - - -  Does patient want to make changes to medical advance directive? No - Patient declined No - Patient declined - - - -  Copy of [place] in Chart? No - copy requested No - copy requested - - - -  Would patient like information on creating a medical advance directive? - - - No - patient declined information No - patient declined information -    Tobacco Social History   Tobacco Use  Smoking Status Never Smoker  Smokeless Tobacco Never Used     Counseling given: Not Answered   Clinical Intake:  Pre-visit preparation completed: Yes  Pain : No/denies pain     Nutritional Status: BMI of 19-24  Normal Diabetes: No  How often do you need to have someone help you when you read instructions, pamphlets, or other written materials from your doctor or pharmacy?: 1 - Never  Interpreter Needed?: No     Past Medical History:  Diagnosis Date  . Arthritis   . Cataract   . History of chicken pox   . History of kidney stones October 2013  . Hx: UTI (urinary tract infection)   . Hyperlipidemia   . Hypertension   . Rheumatoid  arthritis ([place])   . Stroke [facility])    Past Surgical History:  Procedure Laterality Date  . ABDOMINAL HYSTERECTOMY    . BREAST BIOPSY Right 1995  . IR ANGIO INTRA EXTRACRAN SEL COM CAROTID INNOMINATE BILAT MOD SED  07/18/2016  . IR ANGIO VERTEBRAL SEL VERTEBRAL BILAT MOD SED  07/18/2016  . PARTIAL HYSTERECTOMY  1972  . TONSILLECTOMY AND ADENOIDECTOMY  1945   Family History  Problem Relation Age of Onset  . Asthma Mother   . Diabetes Mother   . Cataracts Mother   . Heart disease Father   . Diabetes Father   . Hypertension Father   . Cancer Paternal Aunt        Liver  . Diabetes Paternal Grandmother   . Diabetes Sister   . Retinal degeneration Sister   . Glaucoma Sister    Social History   Socioeconomic History  . Marital status: Married    Spouse name: Not on file  . Number of children: Not on file  . Years of education: Not on file  . Highest education level: Not on file  Occupational History  . Not on file  Social Needs  . Financial resource strain: Not hard at all  . Food insecurity:    Worry: Never true    Inability: Never true  . Transportation needs:  Medical: No    Non-medical: No  Tobacco Use  . Smoking status: Never Smoker  . Smokeless tobacco: Never Used  Substance and Sexual Activity  . Alcohol use: No    Alcohol/week: 0.0 standard drinks  . Drug use: No  . Sexual activity: Not on file  Lifestyle  . Physical activity:    Days per week: 7 days    Minutes per session: 30 min  . Stress: Not at all  Relationships  . Social connections:    Talks on phone: Not on file    Gets together: Not on file    Attends religious service: Not on file    Active member of club or organization: Not on file    Attends meetings of clubs or organizations: Not on file    Relationship status: Not on file  Other Topics Concern  . Not on file  Social History Narrative  . Not on file    Outpatient Encounter Medications as of 12/31/2017  Medication Sig  . ALPRAZolam  (XANAX) 0.5 MG tablet TAKE 1 TABLET AT BEDTIME AS NEEDED FOR ANXIETY.  Marland Kitchen amLODipine (NORVASC) 5 MG tablet Take 1 tablet (5 mg total) by mouth daily.  . Ascorbic Acid (VITAMIN C) 1000 MG tablet Take 1,000 mg by mouth daily.   Marland Kitchen atenolol (TENORMIN) 25 MG tablet Take 1 tablet (25 mg total) by mouth daily. Must keep appt in June for additional refills  . clopidogrel (PLAVIX) 75 MG tablet Take 1 tablet (75 mg total) by mouth daily.  . Coenzyme Q10 100 MG capsule Take 100 mg by mouth daily.  . folic acid (FOLVITE) 784 MCG tablet Take 800 mcg by mouth daily.  . methotrexate (RHEUMATREX) 2.5 MG tablet Take 6 tablets per week. (Patient taking differently: Take 8 tablets per week.)  . mupirocin ointment (BACTROBAN) 2 % Apply to affected area bid  . PARoxetine (PAXIL) 20 MG tablet Take 1 tablet (20 mg total) by mouth daily.  . rosuvastatin (CRESTOR) 5 MG tablet Take 1 tablet (5 mg total) by mouth daily. (Patient not taking: Reported on 12/31/2017)   No facility-administered encounter medications on file as of 12/31/2017.     Activities of Daily Living In your present state of health, do you have any difficulty performing the following activities: 12/31/2017  Hearing? N  Vision? N  Difficulty concentrating or making decisions? N  Walking or climbing stairs? N  Dressing or bathing? N  Doing errands, shopping? N  Preparing Food and eating ? N  Using the Toilet? N  In the past six months, have you accidently leaked urine? N  Do you have problems with loss of bowel control? N  Managing your Medications? N  Managing your Finances? N  Housekeeping or managing your Housekeeping? N  Some recent data might be hidden    Patient Care Team: [name], MD as PCP - General (Internal Medicine)    Assessment:   This is a routine wellness examination for Beth Espinoza. The goal of the wellness visit is to assist the patient how to close the gaps in care and create a preventative care plan for the patient.    The roster of all physicians providing medical care to patient is listed in the Snapshot section of the chart.  Osteoporosis risk reviewed.    Safety issues reviewed; Smoke and carbon monoxide detectors in the home. No firearms in the home. Wears seatbelts when driving or riding with others. No violence in the home.  They do not  have excessive sun exposure.  Discussed the need for sun protection: hats, long sleeves and the use of sunscreen if there is significant sun exposure.  Patient is alert, normal appearance, oriented to person/place/and time.  Correctly identified the president of the [place] and recalls of 3/3 words. Performs simple calculations and can read correct time from watch face.  Displays appropriate judgement.  No new identified risk were noted.  No failures at ADL's or IADL's.    BMI- discussed the importance of a healthy diet, water intake and the benefits of aerobic exercise. Educational material provided.   24 hour diet recall: Keto diet  Dental- UTD  Eye- Visual acuity not assessed per patient preference since they have regular follow up with the ophthalmologist.  Wears corrective lenses.  Sleep patterns- Sleeps without issues.   Influenza vaccine administered L deltoid, tolerated well.  No verbal complaint during or post administration.  Educational material provided.  Health maintenance gaps- closed.  Medications-Holding crestor until next lab draw in January. States her memory has sharpened over the last 2 weeks.  Reports daughter had a conversation with pcp regarding this topic. Pcp following.   Exercise Activities and Dietary recommendations Current Exercise Habits: Home exercise routine, Type of exercise: walking, Time (Minutes): 30, Frequency (Times/Week): 7, Weekly Exercise (Minutes/Week): 210, Intensity: Moderate  Goals    . Maintain healthy lifestyle     Stay active Stay hydrated Healthy diet       Fall Risk Fall Risk  12/31/2017  12/14/2017 12/30/2016 12/24/2015 09/07/2015  Falls in the past year? No No No No No   Depression Screen PHQ 2/9 Scores 12/31/2017 12/14/2017 12/30/2016 12/24/2015  PHQ - 2 Score 0 0 0 0  PHQ- 9 Score - - 0 -     Cognitive Function MMSE - Mini Mental State Exam 12/30/2016  Orientation to time 5  Orientation to Place 5  Registration 3  Attention/ Calculation 5  Recall 3  Language- name 2 objects 2  Language- repeat 1  Language- follow 3 step command 3  Language- read & follow direction 1  Write a sentence 1  Copy design 1  Total score 30     6CIT Screen 12/31/2017  What Year? 0 points  What month? 0 points  What time? 0 points  Count back from 20 0 points  Months in reverse 0 points  Repeat phrase 0 points  Total Score 0    Immunization History  Administered Date(s) Administered  . Influenza Split 12/20/2013  . Influenza,inj,Quad PF,6+ Mos 01/31/2013, 12/31/2017  . Influenza-Unspecified 12/01/2013, 12/13/2014, 12/23/2016  . Tdap 08/11/2017    Screening Tests Health Maintenance  Topic Date Due  . DEXA SCAN  08/16/2002  . MAMMOGRAM  02/05/2025 (Originally 03/09/2014)  . TETANUS/TDAP  08/12/2027  . INFLUENZA VACCINE  Completed  . PNA vac Low Risk Adult  Discontinued       Plan:    End of life planning; Advance aging; Advanced directives discussed. Copy of current HCPOA/Living Will requested.    I have personally reviewed and noted the following in the patient's chart:   . Medical and social history . Use of alcohol, tobacco or illicit drugs  . Current medications and supplements . Functional ability and status . Nutritional status . Physical activity . Advanced directives . List of other physicians . Hospitalizations, surgeries, and ER visits in previous 12 months . Vitals . Screenings to include cognitive, depression, and falls . Referrals and appointments  In addition, I have reviewed and  discussed with patient certain preventive protocols, quality  metrics, and best practice recommendations. A written personalized care plan for preventive services as well as general preventive health recommendations were provided to patient.     Varney Biles, LPN  [id number]

## 2017-12-31 NOTE — Progress Notes (Signed)
Reviewed. No acute concerns. Follow with pcp

## 2017-12-31 NOTE — Patient Instructions (Addendum)
  Beth Espinoza , Thank you for taking time to come for your Medicare Wellness Visit. I appreciate your ongoing commitment to your health goals. Please review the following plan we discussed and let me know if I can assist you in the future.   Follow up with as needed.    Bring a copy of your [place] and/or Living Will to be scanned into chart.  Have a great day!  These are the goals we discussed: Goals    . Maintain healthy lifestyle     Stay active Stay hydrated Healthy diet       This is a list of the screening recommended for you and due dates:  Health Maintenance  Topic Date Due  . DEXA scan (bone density measurement)  08/16/2002  . Mammogram  02/05/2025*  . Tetanus Vaccine  08/12/2027  . Flu Shot  Completed  . Pneumonia vaccines  Discontinued  *Topic was postponed. The date shown is not the original due date.

## 2018-01-11 ENCOUNTER — Ambulatory Visit
Admission: RE | Admit: 2018-01-11 | Discharge: 2018-01-11 | Disposition: A | Discharge status: Home / Self Care | Payer: PPO | Source: Ambulatory Visit | Attending: Cardiothoracic Surgery | Admitting: Cardiothoracic Surgery

## 2018-01-11 DIAGNOSIS — K449 Diaphragmatic hernia without obstruction or gangrene: Secondary | ICD-10-CM | POA: Insufficient documentation

## 2018-01-11 DIAGNOSIS — R911 Solitary pulmonary nodule: Secondary | ICD-10-CM

## 2018-01-11 DIAGNOSIS — I712 Thoracic aortic aneurysm, without rupture, unspecified: Secondary | ICD-10-CM

## 2018-01-11 DIAGNOSIS — I700 Atherosclerosis of aorta: Secondary | ICD-10-CM | POA: Insufficient documentation

## 2018-01-11 LAB — POCT I-STAT CREATININE: CREATININE: 0.8 mg/dL (ref 0.44–1.00)

## 2018-01-11 MED ORDER — IOPAMIDOL (ISOVUE-370) INJECTION 76%
75.0000 mL | Freq: Once | INTRAVENOUS | Status: AC | PRN
  Administered 2018-01-11: 75 mL via INTRAVENOUS

## 2018-01-13 ENCOUNTER — Other Ambulatory Visit: Payer: Self-pay | Admitting: Internal Medicine

## 2018-01-15 ENCOUNTER — Encounter: Payer: Self-pay | Admitting: Cardiothoracic Surgery

## 2018-01-15 ENCOUNTER — Other Ambulatory Visit: Payer: Self-pay

## 2018-01-15 ENCOUNTER — Ambulatory Visit (INDEPENDENT_AMBULATORY_CARE_PROVIDER_SITE_OTHER): Payer: PPO | Admitting: Cardiothoracic Surgery

## 2018-01-15 VITALS — BP 138/66 | HR 61 | Temp 94.6°F | Resp 16 | Ht 65.0 in | Wt 133.2 lb

## 2018-01-15 DIAGNOSIS — I712 Thoracic aortic aneurysm, without rupture, unspecified: Secondary | ICD-10-CM

## 2018-01-15 NOTE — Progress Notes (Signed)
  [name], female   DOB: 01-30-1936, 82 y.o.   MRN: [id number]  HISTORY: She returns today in follow-up.  She had a CT scan made last year which showed dilatation of the ascending aorta to approximately 40 mm in size.  She has no complaints today.  She has had no chest pain or shortness of breath.  Her blood pressures been under much better control.   Vitals:   01/15/18 1043  BP: 138/66  Pulse: 61  Resp: 16  Temp: (!) 94.6 F (34.8 C)  SpO2: 97%     EXAM:    Resp: Lungs are clear bilaterally.  No respiratory distress, normal effort. Heart:  Regular without murmurs Abd:  Abdomen is soft, non distended and non tender. No masses are palpable.  There is no rebound and no guarding.  Neurological: Alert and oriented to person, place, and time. Coordination normal.  Skin: Skin is warm and dry. No rash noted. No diaphoretic. No erythema. No pallor.  Psychiatric: Normal mood and affect. Normal behavior. Judgment and thought content normal.    ASSESSMENT: I have independently reviewed the patient's CT scan.  There is no change in the aortic pathology.   PLAN:   I would like to bring her back one more time with another CT scan of the chest.  She understands and is agreeable to doing so.    Nestor Lewandowsky, MD

## 2018-01-15 NOTE — Patient Instructions (Addendum)
Patient is to return to the office in 1 year with repeat Ct Scan.   Call the office with any questions or concerns.

## 2018-01-25 ENCOUNTER — Other Ambulatory Visit: Payer: Self-pay | Admitting: Internal Medicine

## 2018-01-25 NOTE — Telephone Encounter (Signed)
Refilled: 12/18/2017 Last OV: 12/14/2017 Next OV: 04/16/2018

## 2018-01-26 NOTE — Telephone Encounter (Signed)
rx ok'd for xanax #30 with one refill  

## 2018-01-30 ENCOUNTER — Other Ambulatory Visit: Payer: Self-pay | Admitting: Internal Medicine

## 2018-02-01 ENCOUNTER — Other Ambulatory Visit: Payer: Self-pay | Admitting: Internal Medicine

## 2018-02-18 DIAGNOSIS — M353 Polymyalgia rheumatica: Secondary | ICD-10-CM | POA: Diagnosis not present

## 2018-02-18 DIAGNOSIS — M1990 Unspecified osteoarthritis, unspecified site: Secondary | ICD-10-CM | POA: Diagnosis not present

## 2018-02-22 ENCOUNTER — Other Ambulatory Visit: Payer: Self-pay | Admitting: Internal Medicine

## 2018-02-25 ENCOUNTER — Encounter: Payer: Self-pay | Admitting: Internal Medicine

## 2018-02-26 DIAGNOSIS — H35373 Puckering of macula, bilateral: Secondary | ICD-10-CM | POA: Diagnosis not present

## 2018-02-26 NOTE — Telephone Encounter (Signed)
I have pended everything for 90 day supply except the alprazolam I didn't think you would approve 90 day supply on this medication. All other ok  To send?

## 2018-02-28 ENCOUNTER — Other Ambulatory Visit: Payer: Self-pay | Admitting: Internal Medicine

## 2018-02-28 MED ORDER — PAROXETINE HCL 20 MG PO TABS: 20.0000 mg | ORAL_TABLET | Freq: Every day | ORAL | 0 refills | Status: DC

## 2018-02-28 MED ORDER — ATENOLOL 25 MG PO TABS: 25.0000 mg | ORAL_TABLET | Freq: Every day | ORAL | 1 refills | Status: DC

## 2018-02-28 MED ORDER — ALPRAZOLAM 0.5 MG PO TABS: 0.5000 mg | ORAL_TABLET | Freq: Every day | ORAL | 1 refills | Status: DC | PRN

## 2018-02-28 MED ORDER — ROSUVASTATIN CALCIUM 5 MG PO TABS: 5.0000 mg | ORAL_TABLET | Freq: Every day | ORAL | 1 refills | Status: DC

## 2018-02-28 MED ORDER — CLOPIDOGREL BISULFATE 75 MG PO TABS: 75.0000 mg | ORAL_TABLET | Freq: Every day | ORAL | 1 refills | Status: DC

## 2018-02-28 NOTE — Telephone Encounter (Signed)
I have sent in rx for her prescriptions.  Please confirm pt aware.  Sent to [facility]. (I don't want her to be out of her medication).

## 2018-03-16 ENCOUNTER — Other Ambulatory Visit: Payer: Self-pay | Admitting: Internal Medicine

## 2018-04-02 DIAGNOSIS — H2512 Age-related nuclear cataract, left eye: Secondary | ICD-10-CM | POA: Diagnosis not present

## 2018-04-07 ENCOUNTER — Encounter: Payer: Self-pay | Admitting: *Deleted

## 2018-04-13 ENCOUNTER — Ambulatory Visit
Admission: RE | Admit: 2018-04-13 | Discharge: 2018-04-13 | Disposition: A | Discharge status: Home / Self Care | Payer: PPO | Attending: Ophthalmology | Admitting: Ophthalmology

## 2018-04-13 ENCOUNTER — Other Ambulatory Visit: Payer: Self-pay

## 2018-04-13 ENCOUNTER — Ambulatory Visit: Payer: PPO | Admitting: Anesthesiology

## 2018-04-13 ENCOUNTER — Encounter: Payer: Self-pay | Admitting: *Deleted

## 2018-04-13 ENCOUNTER — Encounter
Admission: RE | Disposition: A | Discharge status: Home / Self Care | Payer: Self-pay | Source: Home / Self Care | Attending: Ophthalmology

## 2018-04-13 DIAGNOSIS — Z79899 Other long term (current) drug therapy: Secondary | ICD-10-CM | POA: Diagnosis not present

## 2018-04-13 DIAGNOSIS — Z85828 Personal history of other malignant neoplasm of skin: Secondary | ICD-10-CM | POA: Insufficient documentation

## 2018-04-13 DIAGNOSIS — M069 Rheumatoid arthritis, unspecified: Secondary | ICD-10-CM | POA: Insufficient documentation

## 2018-04-13 DIAGNOSIS — Z8673 Personal history of transient ischemic attack (TIA), and cerebral infarction without residual deficits: Secondary | ICD-10-CM | POA: Insufficient documentation

## 2018-04-13 DIAGNOSIS — F419 Anxiety disorder, unspecified: Secondary | ICD-10-CM | POA: Insufficient documentation

## 2018-04-13 DIAGNOSIS — I10 Essential (primary) hypertension: Secondary | ICD-10-CM | POA: Diagnosis not present

## 2018-04-13 DIAGNOSIS — Z887 Allergy status to serum and vaccine status: Secondary | ICD-10-CM | POA: Insufficient documentation

## 2018-04-13 DIAGNOSIS — M353 Polymyalgia rheumatica: Secondary | ICD-10-CM | POA: Insufficient documentation

## 2018-04-13 DIAGNOSIS — Z7902 Long term (current) use of antithrombotics/antiplatelets: Secondary | ICD-10-CM | POA: Diagnosis not present

## 2018-04-13 DIAGNOSIS — H2512 Age-related nuclear cataract, left eye: Secondary | ICD-10-CM | POA: Diagnosis not present

## 2018-04-13 HISTORY — DX: Anxiety disorder, unspecified: F41.9

## 2018-04-13 HISTORY — PX: CATARACT EXTRACTION W/PHACO: SHX586

## 2018-04-13 SURGERY — PHACOEMULSIFICATION, CATARACT, WITH IOL INSERTION
Anesthesia: Monitor Anesthesia Care | Site: Eye | Laterality: Left

## 2018-04-13 MED ORDER — MIDAZOLAM HCL 2 MG/2ML IJ SOLN
INTRAMUSCULAR | Status: AC
  Filled 2018-04-13: qty 2

## 2018-04-13 MED ORDER — FENTANYL CITRATE (PF) 100 MCG/2ML IJ SOLN
INTRAMUSCULAR | Status: AC
  Filled 2018-04-13: qty 2

## 2018-04-13 MED ORDER — SODIUM CHLORIDE 0.9 % IV SOLN
INTRAVENOUS | Status: DC
  Administered 2018-04-13: 10:00:00 via INTRAVENOUS

## 2018-04-13 MED ORDER — NA CHONDROIT SULF-NA HYALURON 40-17 MG/ML IO SOLN
INTRAOCULAR | Status: AC
  Filled 2018-04-13: qty 1

## 2018-04-13 MED ORDER — MIDAZOLAM HCL 2 MG/2ML IJ SOLN
INTRAMUSCULAR | Status: DC | PRN
  Administered 2018-04-13 (×2): 1 mg via INTRAVENOUS

## 2018-04-13 MED ORDER — POVIDONE-IODINE 5 % OP SOLN
OPHTHALMIC | Status: AC
  Filled 2018-04-13: qty 30

## 2018-04-13 MED ORDER — EPINEPHRINE PF 1 MG/ML IJ SOLN
INTRAOCULAR | Status: DC | PRN
  Administered 2018-04-13: 1 mL via OPHTHALMIC

## 2018-04-13 MED ORDER — EPINEPHRINE PF 1 MG/ML IJ SOLN
INTRAMUSCULAR | Status: AC
  Filled 2018-04-13: qty 1

## 2018-04-13 MED ORDER — MOXIFLOXACIN HCL 0.5 % OP SOLN
OPHTHALMIC | Status: AC
  Filled 2018-04-13: qty 3

## 2018-04-13 MED ORDER — ARMC OPHTHALMIC DILATING DROPS
OPHTHALMIC | Status: AC
  Filled 2018-04-13: qty 0.5

## 2018-04-13 MED ORDER — MOXIFLOXACIN HCL 0.5 % OP SOLN: 1.0000 [drp] | OPHTHALMIC | Status: DC | PRN

## 2018-04-13 MED ORDER — MOXIFLOXACIN HCL 0.5 % OP SOLN
OPHTHALMIC | Status: DC | PRN
  Administered 2018-04-13: .2 mL via OPHTHALMIC

## 2018-04-13 MED ORDER — LIDOCAINE HCL (PF) 4 % IJ SOLN
INTRAMUSCULAR | Status: AC
  Filled 2018-04-13: qty 5

## 2018-04-13 MED ORDER — CARBACHOL 0.01 % IO SOLN
INTRAOCULAR | Status: DC | PRN
  Administered 2018-04-13: .5 mL via INTRAOCULAR

## 2018-04-13 MED ORDER — LIDOCAINE HCL (PF) 4 % IJ SOLN
INTRAOCULAR | Status: DC | PRN
  Administered 2018-04-13: 2 mL via OPHTHALMIC

## 2018-04-13 MED ORDER — TETRACAINE HCL 0.5 % OP SOLN
1.0000 [drp] | OPHTHALMIC | Status: AC | PRN
  Administered 2018-04-13 (×3): 1 [drp] via OPHTHALMIC

## 2018-04-13 MED ORDER — POVIDONE-IODINE 5 % OP SOLN
OPHTHALMIC | Status: DC | PRN
  Administered 2018-04-13: 1 via OPHTHALMIC

## 2018-04-13 MED ORDER — ARMC OPHTHALMIC DILATING DROPS
1.0000 "application " | OPHTHALMIC | Status: AC
  Administered 2018-04-13 (×3): 1 via OPHTHALMIC

## 2018-04-13 MED ORDER — FENTANYL CITRATE (PF) 100 MCG/2ML IJ SOLN
INTRAMUSCULAR | Status: DC | PRN
  Administered 2018-04-13 (×2): 50 ug via INTRAVENOUS

## 2018-04-13 MED ORDER — TETRACAINE HCL 0.5 % OP SOLN
OPHTHALMIC | Status: AC
  Filled 2018-04-13: qty 4

## 2018-04-13 MED ORDER — NA CHONDROIT SULF-NA HYALURON 40-17 MG/ML IO SOLN
INTRAOCULAR | Status: DC | PRN
  Administered 2018-04-13: 1 mL via INTRAOCULAR

## 2018-04-13 SURGICAL SUPPLY — 16 items

## 2018-04-13 NOTE — Anesthesia Post-op Follow-up Note (Signed)
Anesthesia QCDR form completed.        

## 2018-04-13 NOTE — Op Note (Signed)
PREOPERATIVE DIAGNOSIS:  Nuclear sclerotic cataract of the left eye.   POSTOPERATIVE DIAGNOSIS:  Nuclear sclerotic cataract of the left eye.   OPERATIVE PROCEDURE: Procedure(s): CATARACT EXTRACTION PHACO AND INTRAOCULAR LENS PLACEMENT (IOC) Left Eye   SURGEON:  Birder Robson, MD.   ANESTHESIA:  Anesthesiologist: Emmie Niemann, MD CRNA: Marsh Dolly, CRNA  1.      Managed anesthesia care. 2.     0.37ml of Shugarcaine was instilled following the paracentesis   COMPLICATIONS:  None.   TECHNIQUE:   Stop and chop   DESCRIPTION OF PROCEDURE:  The patient was examined and consented in the preoperative holding area where the aforementioned topical anesthesia was applied to the left eye and then brought back to the Operating Room where the left eye was prepped and draped in the usual sterile ophthalmic fashion and a lid speculum was placed. A paracentesis was created with the side port blade and the anterior chamber was filled with viscoelastic. A near clear corneal incision was performed with the steel keratome. A continuous curvilinear capsulorrhexis was performed with a cystotome followed by the capsulorrhexis forceps. Hydrodissection and hydrodelineation were carried out with BSS on a blunt cannula. The lens was removed in a stop and chop  technique and the remaining cortical material was removed with the irrigation-aspiration handpiece. The capsular bag was inflated with viscoelastic and the Technis [id number] lens was placed in the capsular bag without complication. The remaining viscoelastic was removed from the eye with the irrigation-aspiration handpiece. The wounds were hydrated. The anterior chamber was flushed with Miostat and the eye was inflated to physiologic pressure. 0.43ml Vigamox was placed in the anterior chamber. The wounds were found to be water tight. The eye was dressed with Vigamox. The patient was given protective glasses to wear throughout the day and a shield with which to sleep  tonight. The patient was also given drops with which to begin a drop regimen today and will follow-up with me in one day. Implant Name Type Inv. Item Serial No. Manufacturer Lot No. LRB No. Used  LENS IOL DIOP 21.5 - [id number] Intraocular Lens LENS IOL DIOP 21.5 [id number]  Left 1    Procedure(s) with comments: CATARACT EXTRACTION PHACO AND INTRAOCULAR LENS PLACEMENT ([facility]) Left Eye (Left) - [place]  00:47.0 CDE 5.52 Fluid pack lot # [id number]  Electronically signed: Birder Robson 04/13/2018 10:40 AM

## 2018-04-13 NOTE — H&P (Signed)
All labs reviewed. Abnormal studies sent to patients PCP when indicated.  Previous H&P reviewed, patient examined, there are NO CHANGES.  Beth Wiley Porfilio2/11/202010:13 AM

## 2018-04-13 NOTE — Anesthesia Postprocedure Evaluation (Signed)
Anesthesia Post Note  Patient: Nupur Hohman  Procedure(s) Performed: CATARACT EXTRACTION PHACO AND INTRAOCULAR LENS PLACEMENT (I[facility]) Left Eye (Left Eye)  Patient location during evaluation: PACU Anesthesia Type: MAC Level of consciousness: awake and alert and oriented Pain management: pain level controlled Vital Signs Assessment: post-procedure vital signs reviewed and stable Respiratory status: spontaneous breathing, nonlabored ventilation and respiratory function stable Cardiovascular status: blood pressure returned to baseline and stable Postop Assessment: no signs of nausea or vomiting Anesthetic complications: no     Last Vitals:  Vitals:   04/13/18 1041 04/13/18 1116  BP: (!) 130/44 (!) 134/50  Pulse: (!) 55 (!) 51  Resp: 18 18  Temp: (!) 36.3 C   SpO2: 96% 100%    Last Pain:  Vitals:   04/13/18 1116  TempSrc:   PainSc: 0-No pain                 Michelene Keniston

## 2018-04-13 NOTE — Transfer of Care (Signed)
Immediate Anesthesia Transfer of Care Note  Patient: Beth Espinoza  Procedure(s) Performed: CATARACT EXTRACTION PHACO AND INTRAOCULAR LENS PLACEMENT (IOC) Left Eye (Left Eye)  Patient Location: PACU and Short Stay  Anesthesia Type:MAC  Level of Consciousness: awake, alert  and oriented  Airway & Oxygen Therapy: Patient Spontanous Breathing  Post-op Assessment: Report given to RN and Post -op Vital signs reviewed and stable  Post vital signs: Reviewed and stable  Last Vitals:  Vitals Value Taken Time  BP    Temp    Pulse    Resp    SpO2      Last Pain:  Vitals:   04/07/18 1015  TempSrc: Temporal  PainSc: 0-No pain         Complications: No apparent anesthesia complications

## 2018-04-13 NOTE — Discharge Instructions (Addendum)
Eye Surgery Discharge Instructions  Expect mild scratchy sensation or mild soreness. DO NOT RUB YOUR EYE!  The day of surgery:  Minimal physical activity, but bed rest is not required  No reading, computer work, or close hand work  No bending, lifting, or straining.  May watch TV  For 24 hours:  No driving, legal decisions, or alcoholic beverages  Safety precautions  Eat anything you prefer: It is better to start with liquids, then soup then solid foods.  Solar shield eyeglasses should be worn for comfort in the sunlight/patch while sleeping  Resume all regular medications including aspirin or Coumadin if these were discontinued prior to surgery. You may shower, bathe, shave, or wash your hair. Tylenol may be taken for mild discomfort. Follow Dr. Inda Coke eye drop instruction sheet as reviewed.  Call your doctor if you experience significant pain, nausea, or vomiting, fever > 101 or other signs of infection. [phone] or [phone] Specific instructions:  Follow-up Information    Birder Robson, MD Follow up.   Specialty:  Ophthalmology Why:  04/14/18 @ 9:20 am Contact information: [place] [address] [phone]

## 2018-04-13 NOTE — Anesthesia Preprocedure Evaluation (Signed)
Anesthesia Evaluation  Patient identified by MRN, date of birth, ID band Patient awake    Reviewed: Allergy & Precautions, NPO status , Patient's Chart, lab work & pertinent test results  History of Anesthesia Complications Negative for: history of anesthetic complications  Airway Mallampati: II  TM Distance: >3 FB Neck ROM: Full    Dental no notable dental hx.    Pulmonary neg pulmonary ROS, neg sleep apnea, neg COPD,    breath sounds clear to auscultation- rhonchi (-) wheezing      Cardiovascular hypertension, Pt. on medications (-) CAD, (-) Past MI, (-) Cardiac Stents and (-) CABG  Rhythm:Regular Rate:Normal - Systolic murmurs and - Diastolic murmurs    Neuro/Psych neg Seizures Anxiety TIA   GI/Hepatic negative GI ROS, Neg liver ROS,   Endo/Other  negative endocrine ROSneg diabetes  Renal/GU Renal disease: hx of nephrolithiasis.     Musculoskeletal  (+) Arthritis ,   Abdominal (+) - obese,   Peds  Hematology negative hematology ROS (+)   Anesthesia Other Findings Past Medical History: No date: Anxiety No date: Arthritis No date: Cataract No date: History of chicken pox October 2013: History of kidney stones No date: Hx: UTI (urinary tract infection) No date: Hyperlipidemia No date: Hypertension No date: Rheumatoid arthritis (HCC) No date: Stroke [facility])     Comment:  tia   Reproductive/Obstetrics                             Anesthesia Physical Anesthesia Plan  ASA: II  Anesthesia Plan: MAC   Post-op Pain Management:    Induction: Intravenous  PONV Risk Score and Plan: 2 and Midazolam  Airway Management Planned: Natural Airway  Additional Equipment:   Intra-op Plan:   Post-operative Plan:   Informed Consent: I have reviewed the patients History and Physical, chart, labs and discussed the procedure including the risks, benefits and alternatives for the proposed  anesthesia with the patient or authorized representative who has indicated his/her understanding and acceptance.       Plan Discussed with: CRNA and Anesthesiologist  Anesthesia Plan Comments:         Anesthesia Quick Evaluation

## 2018-04-14 ENCOUNTER — Other Ambulatory Visit (INDEPENDENT_AMBULATORY_CARE_PROVIDER_SITE_OTHER): Payer: PPO

## 2018-04-14 DIAGNOSIS — E7800 Pure hypercholesterolemia, unspecified: Secondary | ICD-10-CM

## 2018-04-14 LAB — LIPID PANEL
Cholesterol: 305 mg/dL — ABNORMAL HIGH (ref 0–200)
HDL: 46.5 mg/dL (ref >39.00)
NonHDL: 258.41
Total CHOL/HDL Ratio: 7
Triglycerides: 222 mg/dL — ABNORMAL HIGH (ref 0.0–149.0)
VLDL: 44.4 mg/dL — ABNORMAL HIGH (ref 0.0–40.0)

## 2018-04-14 LAB — BASIC METABOLIC PANEL
BUN: 20 mg/dL (ref 6–23)
CALCIUM: 9.4 mg/dL (ref 8.4–10.5)
CO2: 30 meq/L (ref 19–32)
Chloride: 104 mEq/L (ref 96–112)
Creatinine, Ser: 0.9 mg/dL (ref 0.40–1.20)
GFR: 60.14 mL/min (ref >60.00)
Glucose, Bld: 94 mg/dL (ref 70–99)
Potassium: 3.9 mEq/L (ref 3.5–5.1)
Sodium: 140 mEq/L (ref 135–145)

## 2018-04-14 LAB — HEPATIC FUNCTION PANEL
ALT: 15 U/L (ref 0–35)
AST: 23 U/L (ref 0–37)
Albumin: 4 g/dL (ref 3.5–5.2)
Alkaline Phosphatase: 60 U/L (ref 39–117)
Bilirubin, Direct: 0 mg/dL (ref 0.0–0.3)
Total Bilirubin: 0.3 mg/dL (ref 0.2–1.2)
Total Protein: 6.6 g/dL (ref 6.0–8.3)

## 2018-04-14 LAB — LDL CHOLESTEROL, DIRECT: Direct LDL: 218 mg/dL

## 2018-04-16 ENCOUNTER — Encounter: Payer: Self-pay | Admitting: Internal Medicine

## 2018-04-16 ENCOUNTER — Ambulatory Visit (INDEPENDENT_AMBULATORY_CARE_PROVIDER_SITE_OTHER): Payer: PPO | Admitting: Internal Medicine

## 2018-04-16 DIAGNOSIS — F419 Anxiety disorder, unspecified: Secondary | ICD-10-CM | POA: Diagnosis not present

## 2018-04-16 DIAGNOSIS — E7800 Pure hypercholesterolemia, unspecified: Secondary | ICD-10-CM

## 2018-04-16 DIAGNOSIS — I77810 Thoracic aortic ectasia: Secondary | ICD-10-CM | POA: Diagnosis not present

## 2018-04-16 DIAGNOSIS — M353 Polymyalgia rheumatica: Secondary | ICD-10-CM

## 2018-04-16 DIAGNOSIS — I6523 Occlusion and stenosis of bilateral carotid arteries: Secondary | ICD-10-CM

## 2018-04-16 DIAGNOSIS — I10 Essential (primary) hypertension: Secondary | ICD-10-CM | POA: Diagnosis not present

## 2018-04-16 MED ORDER — CLOPIDOGREL BISULFATE 75 MG PO TABS: 75.0000 mg | ORAL_TABLET | Freq: Every day | ORAL | 1 refills | Status: DC

## 2018-04-16 MED ORDER — AMLODIPINE BESYLATE 5 MG PO TABS: 5.0000 mg | ORAL_TABLET | Freq: Every day | ORAL | 1 refills | Status: DC

## 2018-04-16 MED ORDER — PAROXETINE HCL 20 MG PO TABS: 20.0000 mg | ORAL_TABLET | Freq: Every day | ORAL | 1 refills | Status: DC

## 2018-04-16 MED ORDER — EZETIMIBE 10 MG PO TABS: 10.0000 mg | ORAL_TABLET | Freq: Every day | ORAL | 2 refills | Status: DC

## 2018-04-16 MED ORDER — ATENOLOL 25 MG PO TABS: 25.0000 mg | ORAL_TABLET | Freq: Every day | ORAL | 1 refills | Status: DC

## 2018-04-16 NOTE — Progress Notes (Signed)
[name], female   DOB: 08/29/1935, 83 y.o.   MRN: [id number]   Subjective:    [name], female    DOB: 07/04/1935, 83 y.o.   MRN: [id number]  HPI  Patient here for a scheduled follow up.  She and her husband are living with her daughter.  Still with increased stress related to her husband.  Discussed with her today.  Overall she feels she is handling things relatively well.  No chest pain. No sob.  No acid reflux.  No abdominal pain.  Bowels moving.  On MTX.  Seeing rheumatology.  Stable.  Discussed recent labs.  She is off statin medication.  Desires not to restart.  Discussed zetia.     Past Medical History:  Diagnosis Date  . Anxiety   . Arthritis   . Cataract   . History of chicken pox   . History of kidney stones October 2013  . Hx: UTI (urinary tract infection)   . Hyperlipidemia   . Hypertension   . Rheumatoid arthritis ([facility])   . Stroke [facility])    tia   Past Surgical History:  Procedure Laterality Date  . ABDOMINAL HYSTERECTOMY    . BREAST BIOPSY Right 1995  . CATARACT EXTRACTION W/PHACO Left 04/13/2018   Procedure: CATARACT EXTRACTION PHACO AND INTRAOCULAR LENS PLACEMENT ([place]) Left Eye;  Surgeon: Birder Robson, MD;  Location: [facility] ORS;  Service: Ophthalmology;  Laterality: Left;  [place]  00:47.0 CDE 5.52 Fluid pack lot # [id number]  . EYE SURGERY    . IR ANGIO INTRA EXTRACRAN SEL COM CAROTID INNOMINATE BILAT MOD SED  07/18/2016  . IR ANGIO VERTEBRAL SEL VERTEBRAL BILAT MOD SED  07/18/2016  . PARTIAL HYSTERECTOMY  1972  . TONSILLECTOMY AND ADENOIDECTOMY  1945   Family History  Problem Relation Age of Onset  . Asthma Mother   . Diabetes Mother   . Cataracts Mother   . Heart disease Father   . Diabetes Father   . Hypertension Father   . Cancer Paternal Aunt        Liver  . Diabetes Paternal Grandmother   . Diabetes Sister   . Retinal degeneration Sister   . Glaucoma Sister    Social History   Socioeconomic History  . Marital status: Married      Spouse name: Not on file  . Number of children: Not on file  . Years of education: Not on file  . Highest education level: Not on file  Occupational History  . Not on file  Social Needs  . Financial resource strain: Not hard at all  . Food insecurity:    Worry: Never true    Inability: Never true  . Transportation needs:    Medical: No    Non-medical: No  Tobacco Use  . Smoking status: Never Smoker  . Smokeless tobacco: Never Used  Substance and Sexual Activity  . Alcohol use: No    Alcohol/week: 0.0 standard drinks  . Drug use: No  . Sexual activity: Not on file  Lifestyle  . Physical activity:    Days per week: 7 days    Minutes per session: 30 min  . Stress: Not at all  Relationships  . Social connections:    Talks on phone: Not on file    Gets together: Not on file    Attends religious service: Not on file    Active member of club or organization: Not on file    Attends meetings of clubs  or organizations: Not on file    Relationship status: Not on file  Other Topics Concern  . Not on file  Social History Narrative  . Not on file    Outpatient Encounter Medications as of 04/16/2018  Medication Sig  . ALPRAZolam (XANAX) 0.5 MG tablet Take 1 tablet (0.5 mg total) by mouth daily as needed for anxiety. (Patient taking differently: Take 0.5 mg by mouth every evening. )  . amLODipine (NORVASC) 5 MG tablet Take 1 tablet (5 mg total) by mouth daily.  Marland Kitchen atenolol (TENORMIN) 25 MG tablet Take 1 tablet (25 mg total) by mouth daily. Must keep appt in June for additional refills  . Bioflavonoid Products (VITAMIN C) CHEW Chew 1 tablet by mouth daily.  . clopidogrel (PLAVIX) 75 MG tablet Take 1 tablet (75 mg total) by mouth daily.  . folic acid (FOLVITE) 322 MCG tablet Take 800 mcg by mouth daily.   . methotrexate (RHEUMATREX) 2.5 MG tablet Take 6 tablets per week. (Patient taking differently: Take 10 mg by mouth every Sunday. )  . PARoxetine (PAXIL) 20 MG tablet Take 1 tablet  (20 mg total) by mouth daily.  . [DISCONTINUED] amLODipine (NORVASC) 5 MG tablet Take 1 tablet (5 mg total) by mouth daily.  . [DISCONTINUED] atenolol (TENORMIN) 25 MG tablet Take 1 tablet (25 mg total) by mouth daily. Must keep appt in June for additional refills (Patient taking differently: Take 25 mg by mouth every evening. Must keep appt in June for additional refills)  . [DISCONTINUED] clopidogrel (PLAVIX) 75 MG tablet Take 1 tablet (75 mg total) by mouth daily.  . [DISCONTINUED] PARoxetine (PAXIL) 20 MG tablet Take 1 tablet (20 mg total) by mouth daily.  Marland Kitchen ezetimibe (ZETIA) 10 MG tablet Take 1 tablet (10 mg total) by mouth daily.  . [DISCONTINUED] rosuvastatin (CRESTOR) 5 MG tablet Take 1 tablet (5 mg total) by mouth daily. (Patient not taking: Reported on 04/16/2018)   No facility-administered encounter medications on file as of 04/16/2018.     Review of Systems  Constitutional: Negative for appetite change and unexpected weight change.  HENT: Negative for congestion and sinus pressure.   Respiratory: Negative for cough, chest tightness and shortness of breath.   Cardiovascular: Negative for chest pain, palpitations and leg swelling.  Gastrointestinal: Negative for abdominal pain, diarrhea, nausea and vomiting.  Genitourinary: Negative for difficulty urinating and dysuria.  Musculoskeletal: Negative for joint swelling and myalgias.  Skin: Negative for color change and rash.  Neurological: Negative for dizziness, light-headedness and headaches.  Psychiatric/Behavioral: Negative for agitation and dysphoric mood.       Objective:    Physical Exam Constitutional:      General: She is not in acute distress.    Appearance: Normal appearance.  HENT:     Nose: Nose normal. No congestion.     Mouth/Throat:     Pharynx: No oropharyngeal exudate or posterior oropharyngeal erythema.  Neck:     Musculoskeletal: Neck supple. No muscular tenderness.     Thyroid: No thyromegaly.    Cardiovascular:     Rate and Rhythm: Normal rate and regular rhythm.  Pulmonary:     Effort: No respiratory distress.     Breath sounds: Normal breath sounds. No wheezing.  Abdominal:     General: Bowel sounds are normal.     Palpations: Abdomen is soft.     Tenderness: There is no abdominal tenderness.  Musculoskeletal:        General: No swelling or tenderness.  Lymphadenopathy:  Cervical: No cervical adenopathy.  Skin:    Findings: No erythema or rash.  Neurological:     Mental Status: She is alert.  Psychiatric:        Mood and Affect: Mood normal.        Behavior: Behavior normal.     BP 120/70   Pulse (!) 58   Temp (!) 97.5 F (36.4 C) (Oral)   Wt 137 lb 6.4 oz (62.3 kg)   SpO2 96%   BMI 22.86 kg/m  Wt Readings from Last 3 Encounters:  04/16/18 137 lb 6.4 oz (62.3 kg)  04/07/18 133 lb 15.9 oz (60.8 kg)  01/15/18 133 lb 3.2 oz (60.4 kg)     Lab Results  Component Value Date   WBC 8.1 12/17/2016   HGB 14.1 12/17/2016   HCT 43.4 12/17/2016   PLT 346.0 12/17/2016   GLUCOSE 94 04/14/2018   CHOL 305 (H) 04/14/2018   TRIG 222.0 (H) 04/14/2018   HDL 46.50 04/14/2018   LDLDIRECT 218.0 04/14/2018   LDLCALC 109 (H) 05/06/2017   ALT 15 04/14/2018   AST 23 04/14/2018   NA 140 04/14/2018   K 3.9 04/14/2018   CL 104 04/14/2018   CREATININE 0.90 04/14/2018   BUN 20 04/14/2018   CO2 30 04/14/2018   TSH 2.87 05/06/2017   INR 1.07 07/18/2016       Assessment & Plan:   Problem List Items Addressed This Visit    Anxiety    On paxil.  Stable on current regimen.        Relevant Medications   PARoxetine (PAXIL) 20 MG tablet   Carotid stenosis    Evaluated by [facility].  Continue plavix.  Recommended f/u 2 years after last check.        Relevant Medications   ezetimibe (ZETIA) 10 MG tablet   atenolol (TENORMIN) 25 MG tablet   amLODipine (NORVASC) 5 MG tablet   Ectatic thoracic aorta (HCC)    Followed by Dr Genevive Bi.  Last evaluated 01/2018.  Felt stable.   Recommended f/u in one year.        Relevant Medications   ezetimibe (ZETIA) 10 MG tablet   atenolol (TENORMIN) 25 MG tablet   amLODipine (NORVASC) 5 MG tablet   Essential hypertension    Blood pressure under good control.  Continue same medication regimen.  Follow pressures.  Follow metabolic panel.        Relevant Medications   ezetimibe (ZETIA) 10 MG tablet   atenolol (TENORMIN) 25 MG tablet   amLODipine (NORVASC) 5 MG tablet   Other Relevant Orders   CBC with Differential/Platelet   TSH   Basic metabolic panel   Hypercholesterolemia    Elevated cholesterol.  Off crestor. Desires not to restart a statin medication.  Discussed treatment.  Start zetia 10mg  q day.  Follow lipid panel.       Relevant Medications   ezetimibe (ZETIA) 10 MG tablet   atenolol (TENORMIN) 25 MG tablet   amLODipine (NORVASC) 5 MG tablet   Other Relevant Orders   Hepatic function panel   Lipid panel   PMR (polymyalgia rheumatica) (HCC)    Followed by rheumatology.  On MTX.  Stable.            [name], MD

## 2018-04-19 ENCOUNTER — Encounter: Payer: Self-pay | Admitting: Internal Medicine

## 2018-04-19 NOTE — Assessment & Plan Note (Signed)
Followed by Dr Genevive Bi.  Last evaluated 01/2018.  Felt stable.  Recommended f/u in one year.

## 2018-04-19 NOTE — Assessment & Plan Note (Signed)
On paxil.  Stable on current regimen.

## 2018-04-19 NOTE — Assessment & Plan Note (Signed)
Elevated cholesterol.  Off crestor. Desires not to restart a statin medication.  Discussed treatment.  Start zetia 10mg  q day.  Follow lipid panel.

## 2018-04-19 NOTE — Assessment & Plan Note (Signed)
Evaluated by [facility].  Continue plavix.  Recommended f/u 2 years after last check.

## 2018-04-19 NOTE — Assessment & Plan Note (Signed)
Followed by rheumatology.  On MTX.  Stable.   

## 2018-04-19 NOTE — Assessment & Plan Note (Signed)
Blood pressure under good control.  Continue same medication regimen.  Follow pressures.  Follow metabolic panel.   

## 2018-05-10 DIAGNOSIS — Z961 Presence of intraocular lens: Secondary | ICD-10-CM | POA: Diagnosis not present

## 2018-05-20 ENCOUNTER — Other Ambulatory Visit: Payer: Self-pay | Admitting: Internal Medicine

## 2018-05-21 NOTE — Telephone Encounter (Signed)
Last OV 04/16/2018  Last refilled 02/28/2018 disp 30 with 1 refill  Next appt 08/13/2018  Sent to PCP for approval

## 2018-05-21 NOTE — Telephone Encounter (Signed)
rx ok'd for xanax #30 with one refill  

## 2018-07-09 ENCOUNTER — Other Ambulatory Visit: Payer: Self-pay

## 2018-07-09 ENCOUNTER — Ambulatory Visit (INDEPENDENT_AMBULATORY_CARE_PROVIDER_SITE_OTHER): Payer: PPO | Admitting: Family Medicine

## 2018-07-09 ENCOUNTER — Other Ambulatory Visit: Payer: Self-pay | Admitting: Internal Medicine

## 2018-07-09 ENCOUNTER — Ambulatory Visit: Payer: Self-pay

## 2018-07-09 ENCOUNTER — Encounter: Payer: Self-pay | Admitting: Family Medicine

## 2018-07-09 DIAGNOSIS — H9201 Otalgia, right ear: Secondary | ICD-10-CM

## 2018-07-09 DIAGNOSIS — Q383 Other congenital malformations of tongue: Secondary | ICD-10-CM | POA: Diagnosis not present

## 2018-07-09 DIAGNOSIS — J029 Acute pharyngitis, unspecified: Secondary | ICD-10-CM | POA: Diagnosis not present

## 2018-07-09 MED ORDER — AMOXICILLIN 500 MG PO CAPS: 500.0000 mg | ORAL_CAPSULE | Freq: Two times a day (BID) | ORAL | 0 refills | Status: DC

## 2018-07-09 NOTE — Telephone Encounter (Signed)
Pt who is on blood thinners, called to report the underneath the tongue on the right side is black. Pt stated that the right side of her throat hurts when she swallows. Pt stated that she doesn't remember biting it. Care advice given and pt verbalized understanding. Pt transferred to office for appt.  Reason for Disposition . All other mouth symptoms (Exceptions: dry mouth from not drinking enough liquids, chapped lips)  Answer Assessment - Initial Assessment Questions 1. SYMPTOM: "What's the main symptom you're concerned about?" (e.g., dry mouth. chapped lips, lump)     Under side of tongue is black right side 2. ONSET: "When did the  Black tongue  start?"    This morning 3. PAIN: "Is there any pain?" If so, ask: "How bad is it?" (Scale: 1-10; mild, moderate, severe)     no 4. CAUSE: "What do you think is causing the symptoms?"    Blood thinners 5. OTHER SYMPTOMS: "Do you have any other symptoms?" (e.g., fever, sore throat, toothache, swelling)     Right side of throat is sore when she swallows 6. PREGNANCY: "Is there any chance you are pregnant?" "When was your last menstrual period?"     n/a  Protocols used: MOUTH [facility]

## 2018-07-09 NOTE — Progress Notes (Signed)
[name], female   DOB: 05-Feb-1936, 83 y.o.   MRN: [id number]    Virtual Visit via video Note  This visit type was conducted due to national recommendations for restrictions regarding the COVID-19 pandemic (e.g. social distancing).  This format is felt to be most appropriate for this patient at this time.  All issues noted in this document were discussed and addressed.  No physical exam was performed (except for noted visual exam findings with Video Visits).   I connected with [name] and Daughter today at  3:00 PM EDT by a video enabled telemedicine application or telephone and verified that I am speaking with the correct person using two identifiers. Location patient: home Location provider: [facility] Persons participating in the virtual visit: patient, provider, patients daughter.  I discussed the limitations, risks, security and privacy concerns of performing an evaluation and management service by video and the availability of in person appointments. I also discussed with the patient that there may be a patient responsible charge related to this service. The patient expressed understanding and agreed to proceed.  HPI:  Patient had no significant due to complaints of right earache, sore throat on right side and also blackened-looking area underneath right side of tongue.  Patient states sore throat and right earache began about a couple of days ago, she has been remaining home for the majority of time throughout the pandemic.  She has gone to the grocery store a couple of times throughout the past few weeks but otherwise stays home.  Denies fever or chills.  Denies cough, shortness of breath or wheezing.  Does have some nasal congestion, but not abnormal for this time of year with seasonal allergies.  Denies headache.  Denies nausea, vomiting or diarrhea.  Denies urinary issues.  Denies biting her tongue or inner injury to tongue.  States nothing like this is ever happened  to tongue before.  Denies any pain in her teeth.  Denies seeing any white film on her tongue or white spots in back of throat.   ROS: See pertinent positives and negatives per HPI.  Past Medical History:  Diagnosis Date  . Anxiety   . Arthritis   . Cataract   . History of chicken pox   . History of kidney stones October 2013  . Hx: UTI (urinary tract infection)   . Hyperlipidemia   . Hypertension   . Rheumatoid arthritis ([place])   . Stroke [facility])    tia    Past Surgical History:  Procedure Laterality Date  . ABDOMINAL HYSTERECTOMY    . BREAST BIOPSY Right 1995  . CATARACT EXTRACTION W/PHACO Left 04/13/2018   Procedure: CATARACT EXTRACTION PHACO AND INTRAOCULAR LENS PLACEMENT ([place]) Left Eye;  Surgeon: Birder Robson, MD;  Location: [facility] ORS;  Service: Ophthalmology;  Laterality: Left;  [place]  00:47.0 CDE 5.52 Fluid pack lot # [id number]  . EYE SURGERY    . IR ANGIO INTRA EXTRACRAN SEL COM CAROTID INNOMINATE BILAT MOD SED  07/18/2016  . IR ANGIO VERTEBRAL SEL VERTEBRAL BILAT MOD SED  07/18/2016  . PARTIAL HYSTERECTOMY  1972  . TONSILLECTOMY AND ADENOIDECTOMY  1945    Family History  Problem Relation Age of Onset  . Asthma Mother   . Diabetes Mother   . Cataracts Mother   . Heart disease Father   . Diabetes Father   . Hypertension Father   . Cancer Paternal Aunt        Liver  . Diabetes Paternal Grandmother   .  Diabetes Sister   . Retinal degeneration Sister   . Glaucoma Sister    Social History   Tobacco Use  . Smoking status: Never Smoker  . Smokeless tobacco: Never Used  Substance Use Topics  . Alcohol use: No    Alcohol/week: 0.0 standard drinks    Current Outpatient Medications:  .  ALPRAZolam (XANAX) 0.5 MG tablet, Take 1 tablet (0.5 mg total) by mouth daily as needed for anxiety., Disp: 30 tablet, Rfl: 1 .  amLODipine (NORVASC) 5 MG tablet, Take 1 tablet (5 mg total) by mouth daily., Disp: 90 tablet, Rfl: 1 .  atenolol (TENORMIN) 25 MG tablet, Take 1 tablet  (25 mg total) by mouth daily. Must keep appt in June for additional refills, Disp: 30 tablet, Rfl: 1 .  Bioflavonoid Products (VITAMIN C) CHEW, Chew 1 tablet by mouth daily., Disp: , Rfl:  .  clopidogrel (PLAVIX) 75 MG tablet, Take 1 tablet (75 mg total) by mouth daily., Disp: 90 tablet, Rfl: 1 .  ezetimibe (ZETIA) 10 MG tablet, Take 1 tablet (10 mg total) by mouth daily., Disp: 30 tablet, Rfl: 2 .  folic acid (FOLVITE) 893 MCG tablet, Take 800 mcg by mouth daily. , Disp: , Rfl:  .  methotrexate (RHEUMATREX) 2.5 MG tablet, Take 6 tablets per week. (Patient taking differently: Take 10 mg by mouth every Sunday. ), Disp: 4 tablet, Rfl: 0 .  PARoxetine (PAXIL) 20 MG tablet, Take 1 tablet (20 mg total) by mouth daily., Disp: 90 tablet, Rfl: 1  EXAM:  GENERAL: alert, oriented, appears well and in no acute distress  HEENT: atraumatic, conjunttiva clear, no obvious abnormalities on inspection of external nose and ears. Patient does have pattern on tip of the right underside of tongue that looks like a bruising pattern. ?bit tip of her tongue  NECK: normal movements of the head and neck  LUNGS: on inspection no signs of respiratory distress, breathing rate appears normal, no obvious gross SOB, gasping or wheezing  CV: no obvious cyanosis  MS: moves all visible extremities without noticeable abnormality  PSYCH/NEURO: pleasant and cooperative, no obvious depression or anxiety, speech and thought processing grossly intact  ASSESSMENT AND PLAN:  Discussed the following assessment and plan:  Sore throat - Plan: amoxicillin (AMOXIL) 500 MG capsule  Tongue abnormality  Otalgia of right ear  I am unsure of the reason for the bruising-like pattern on underside of the tongue, patient will monitor it over the weekend to see if it improves and if not then we will plan to have her come into clinic for a complete blood count to see if platelet levels are stable.  Also consider possibility of referring to  ENT for evaluation of tongue issue.  Due to sore throat and pain in right ear, patient could be having ear infection/sinus flexion/URI, we will cover her with amoxicillin twice daily for 10 days advised to increase fluid intake, do saline nasal rinses, gargle with salt water, take Tylenol as needed and monitor cell for any worsening of symptoms.   I discussed the assessment and treatment plan with the patient. The patient was provided an opportunity to ask questions and all were answered. The patient agreed with the plan and demonstrated an understanding of the instructions.   The patient was advised to call back or seek an in-person evaluation if the symptoms worsen or if the condition fails to improve as anticipated.  We will circle back with patient on Monday to follow-up on how she is doing  and if time it has changed or improved.  Patient aware that if things do change over the weekend she can call on-call right away for further instruction on next steps.  Jodelle Green, FNP

## 2018-07-19 ENCOUNTER — Other Ambulatory Visit: Payer: Self-pay | Admitting: Internal Medicine

## 2018-07-20 NOTE — Telephone Encounter (Signed)
Last OV 04/16/2018 Next OV 08/13/18 Last refill 05/21/18

## 2018-08-09 ENCOUNTER — Other Ambulatory Visit: Payer: Self-pay

## 2018-08-09 ENCOUNTER — Other Ambulatory Visit: Payer: PPO

## 2018-08-12 ENCOUNTER — Other Ambulatory Visit: Payer: PPO

## 2018-08-12 ENCOUNTER — Other Ambulatory Visit: Payer: Self-pay

## 2018-08-12 ENCOUNTER — Other Ambulatory Visit (INDEPENDENT_AMBULATORY_CARE_PROVIDER_SITE_OTHER): Payer: PPO

## 2018-08-12 DIAGNOSIS — E7800 Pure hypercholesterolemia, unspecified: Secondary | ICD-10-CM

## 2018-08-12 DIAGNOSIS — I10 Essential (primary) hypertension: Secondary | ICD-10-CM

## 2018-08-12 LAB — HEPATIC FUNCTION PANEL
ALT: 14 U/L (ref 0–35)
AST: 24 U/L (ref 0–37)
Albumin: 4.2 g/dL (ref 3.5–5.2)
Alkaline Phosphatase: 62 U/L (ref 39–117)
Bilirubin, Direct: 0.1 mg/dL (ref 0.0–0.3)
Total Bilirubin: 0.7 mg/dL (ref 0.2–1.2)
Total Protein: 6.9 g/dL (ref 6.0–8.3)

## 2018-08-12 LAB — CBC WITH DIFFERENTIAL/PLATELET
Basophils Absolute: 0.2 10*3/uL — ABNORMAL HIGH (ref 0.0–0.1)
Basophils Relative: 2.3 % (ref 0.0–3.0)
Eosinophils Absolute: 0.3 10*3/uL (ref 0.0–0.7)
Eosinophils Relative: 4 % (ref 0.0–5.0)
HCT: 41.5 % (ref 36.0–46.0)
Hemoglobin: 13.9 g/dL (ref 12.0–15.0)
Lymphocytes Relative: 21.3 % (ref 12.0–46.0)
Lymphs Abs: 1.6 10*3/uL (ref 0.7–4.0)
MCHC: 33.4 g/dL (ref 30.0–36.0)
MCV: 92.1 fl (ref 78.0–100.0)
Monocytes Absolute: 0.7 10*3/uL (ref 0.1–1.0)
Monocytes Relative: 8.8 % (ref 3.0–12.0)
Neutro Abs: 4.8 10*3/uL (ref 1.4–7.7)
Neutrophils Relative %: 63.6 % (ref 43.0–77.0)
Platelets: 300 10*3/uL (ref 150.0–400.0)
RBC: 4.51 Mil/uL (ref 3.87–5.11)
RDW: 15.3 % (ref 11.5–15.5)
WBC: 7.5 10*3/uL (ref 4.0–10.5)

## 2018-08-12 LAB — BASIC METABOLIC PANEL
BUN: 18 mg/dL (ref 6–23)
CO2: 25 mEq/L (ref 19–32)
Calcium: 9.5 mg/dL (ref 8.4–10.5)
Chloride: 106 mEq/L (ref 96–112)
Creatinine, Ser: 0.9 mg/dL (ref 0.40–1.20)
GFR: 60.09 mL/min (ref >60.00)
Glucose, Bld: 91 mg/dL (ref 70–99)
Potassium: 4.3 mEq/L (ref 3.5–5.1)
Sodium: 139 mEq/L (ref 135–145)

## 2018-08-12 LAB — LIPID PANEL
Cholesterol: 297 mg/dL — ABNORMAL HIGH (ref 0–200)
HDL: 55.2 mg/dL (ref >39.00)
LDL Cholesterol: 218 mg/dL — ABNORMAL HIGH (ref 0–99)
NonHDL: 242.18
Total CHOL/HDL Ratio: 5
Triglycerides: 123 mg/dL (ref 0.0–149.0)
VLDL: 24.6 mg/dL (ref 0.0–40.0)

## 2018-08-12 LAB — TSH: TSH: 1.17 u[IU]/mL (ref 0.35–4.50)

## 2018-08-13 ENCOUNTER — Encounter: Payer: PPO | Admitting: Internal Medicine

## 2018-08-17 ENCOUNTER — Other Ambulatory Visit: Payer: Self-pay

## 2018-08-17 ENCOUNTER — Encounter: Payer: Self-pay | Admitting: Internal Medicine

## 2018-08-17 ENCOUNTER — Ambulatory Visit (INDEPENDENT_AMBULATORY_CARE_PROVIDER_SITE_OTHER): Payer: PPO | Admitting: Internal Medicine

## 2018-08-17 ENCOUNTER — Other Ambulatory Visit: Payer: Self-pay | Admitting: Internal Medicine

## 2018-08-17 VITALS — BP 122/64 | HR 58 | Temp 97.6°F | Resp 16 | Wt 140.6 lb

## 2018-08-17 DIAGNOSIS — I712 Thoracic aortic aneurysm, without rupture: Secondary | ICD-10-CM

## 2018-08-17 DIAGNOSIS — Z0000 Encounter for general adult medical examination without abnormal findings: Secondary | ICD-10-CM

## 2018-08-17 DIAGNOSIS — E7800 Pure hypercholesterolemia, unspecified: Secondary | ICD-10-CM

## 2018-08-17 DIAGNOSIS — I10 Essential (primary) hypertension: Secondary | ICD-10-CM | POA: Diagnosis not present

## 2018-08-17 DIAGNOSIS — I7121 Aneurysm of the ascending aorta, without rupture: Secondary | ICD-10-CM

## 2018-08-17 DIAGNOSIS — E2839 Other primary ovarian failure: Secondary | ICD-10-CM

## 2018-08-17 DIAGNOSIS — M353 Polymyalgia rheumatica: Secondary | ICD-10-CM

## 2018-08-17 DIAGNOSIS — I6523 Occlusion and stenosis of bilateral carotid arteries: Secondary | ICD-10-CM | POA: Diagnosis not present

## 2018-08-17 DIAGNOSIS — F419 Anxiety disorder, unspecified: Secondary | ICD-10-CM | POA: Diagnosis not present

## 2018-08-17 DIAGNOSIS — Z860100 Personal history of colon polyps, unspecified: Secondary | ICD-10-CM

## 2018-08-17 NOTE — Progress Notes (Signed)
[name], female   DOB: May 08, 1935, 83 y.o.   MRN: [id number]   Subjective:    [name], female    DOB: September 04, 1935, 83 y.o.   MRN: [id number]  HPI  Patient here for her physical exam.  She reports she is doing well.  Feels good.  Staying active.  Just celebrated her 83st birthday.  Has been trying to stay in due to covid restrictions.  Did have some family members at her house for her birthday, but states they stay in.  No fever.  No cough, chest congestion or sob.  No chest pain.  No acid reflux.  No abdominal pain.  Bowels moving.  Intolerant to statin medication and zetia.  Cholesterol elevated.  Discussed repatha. Discussed mammogram.  She declines.  Discussed f/u colonoscopy.  She declines.  Handling stress.  On paxil. Uses xanax prn.      Past Medical History:  Diagnosis Date  . Anxiety   . Arthritis   . Cataract   . History of chicken pox   . History of kidney stones October 2013  . Hx: UTI (urinary tract infection)   . Hyperlipidemia   . Hypertension   . Rheumatoid arthritis ([place])   . Stroke [facility])    tia   Past Surgical History:  Procedure Laterality Date  . ABDOMINAL HYSTERECTOMY    . BREAST BIOPSY Right 1995  . CATARACT EXTRACTION W/PHACO Left 04/13/2018   Procedure: CATARACT EXTRACTION PHACO AND INTRAOCULAR LENS PLACEMENT ([place]) Left Eye;  Surgeon: Birder Robson, MD;  Location: [facility] ORS;  Service: Ophthalmology;  Laterality: Left;  [place]  00:47.0 CDE 5.52 Fluid pack lot # [id number]  . EYE SURGERY    . IR ANGIO INTRA EXTRACRAN SEL COM CAROTID INNOMINATE BILAT MOD SED  07/18/2016  . IR ANGIO VERTEBRAL SEL VERTEBRAL BILAT MOD SED  07/18/2016  . PARTIAL HYSTERECTOMY  1972  . TONSILLECTOMY AND ADENOIDECTOMY  1945   Family History  Problem Relation Age of Onset  . Asthma Mother   . Diabetes Mother   . Cataracts Mother   . Heart disease Father   . Diabetes Father   . Hypertension Father   . Cancer Paternal Aunt        Liver  . Diabetes Paternal  Grandmother   . Diabetes Sister   . Retinal degeneration Sister   . Glaucoma Sister    Social History   Socioeconomic History  . Marital status: Married    Spouse name: Not on file  . Number of children: Not on file  . Years of education: Not on file  . Highest education level: Not on file  Occupational History  . Not on file  Social Needs  . Financial resource strain: Not hard at all  . Food insecurity    Worry: Never true    Inability: Never true  . Transportation needs    Medical: No    Non-medical: No  Tobacco Use  . Smoking status: Never Smoker  . Smokeless tobacco: Never Used  Substance and Sexual Activity  . Alcohol use: No    Alcohol/week: 0.0 standard drinks  . Drug use: No  . Sexual activity: Not on file  Lifestyle  . Physical activity    Days per week: 7 days    Minutes per session: 30 min  . Stress: Not at all  Relationships  . Social Herbalist on phone: Not on file    Gets together: Not on file  Attends religious service: Not on file    Active member of club or organization: Not on file    Attends meetings of clubs or organizations: Not on file    Relationship status: Not on file  Other Topics Concern  . Not on file  Social History Narrative  . Not on file    Outpatient Encounter Medications as of 08/17/2018  Medication Sig  . amLODipine (NORVASC) 5 MG tablet Take 1 tablet (5 mg total) by mouth daily.  Marland Kitchen amoxicillin (AMOXIL) 500 MG capsule Take 1 capsule (500 mg total) by mouth 2 (two) times daily.  Marland Kitchen atenolol (TENORMIN) 25 MG tablet Take 1 tablet (25 mg total) by mouth daily. Must keep appt in June for additional refills  . Bioflavonoid Products (VITAMIN C) CHEW Chew 1 tablet by mouth daily.  . clopidogrel (PLAVIX) 75 MG tablet Take 1 tablet (75 mg total) by mouth daily.  Marland Kitchen ezetimibe (ZETIA) 10 MG tablet Take 1 tablet (10 mg total) by mouth daily.  . folic acid (FOLVITE) 710 MCG tablet Take 800 mcg by mouth daily.   . methotrexate  (RHEUMATREX) 2.5 MG tablet Take 6 tablets per week. (Patient taking differently: Take 10 mg by mouth every Sunday. )  . PARoxetine (PAXIL) 20 MG tablet Take 1 tablet (20 mg total) by mouth daily.  . [DISCONTINUED] ALPRAZolam (XANAX) 0.5 MG tablet Take 1 tablet (0.5 mg total) by mouth daily as needed for anxiety.   No facility-administered encounter medications on file as of 08/17/2018.     Review of Systems  Constitutional: Negative for appetite change and unexpected weight change.  HENT: Negative for congestion and sinus pressure.   Eyes: Negative for pain and visual disturbance.  Respiratory: Negative for cough, chest tightness and shortness of breath.   Cardiovascular: Negative for chest pain, palpitations and leg swelling.  Gastrointestinal: Negative for abdominal pain, diarrhea, nausea and vomiting.  Genitourinary: Negative for difficulty urinating and dysuria.  Musculoskeletal: Negative for joint swelling and myalgias.  Skin: Negative for color change and rash.  Neurological: Negative for dizziness, light-headedness and headaches.  Hematological: Negative for adenopathy. Does not bruise/bleed easily.  Psychiatric/Behavioral: Negative for agitation and dysphoric mood.       Objective:    Physical Exam Constitutional:      General: She is not in acute distress.    Appearance: Normal appearance. She is well-developed.  HENT:     Right Ear: External ear normal.     Left Ear: External ear normal.  Eyes:     General: No scleral icterus.       Right eye: No discharge.        Left eye: No discharge.     Conjunctiva/sclera: Conjunctivae normal.  Neck:     Musculoskeletal: Neck supple. No muscular tenderness.     Thyroid: No thyromegaly.  Cardiovascular:     Rate and Rhythm: Normal rate and regular rhythm.  Pulmonary:     Effort: No tachypnea, accessory muscle usage or respiratory distress.     Breath sounds: Normal breath sounds. No decreased breath sounds or wheezing.   Chest:     Breasts:        Right: No inverted nipple, mass, nipple discharge or tenderness (no axillary adenopathy).        Left: No inverted nipple, mass, nipple discharge or tenderness (no axilarry adenopathy).  Abdominal:     General: Bowel sounds are normal.     Palpations: Abdomen is soft.     Tenderness: There  is no abdominal tenderness.  Musculoskeletal:        General: No swelling or tenderness.  Lymphadenopathy:     Cervical: No cervical adenopathy.  Skin:    General: Skin is warm.     Findings: No erythema or rash.  Neurological:     Mental Status: She is alert and oriented to person, place, and time.  Psychiatric:        Mood and Affect: Mood normal.        Behavior: Behavior normal.     BP 122/64   Pulse (!) 58   Temp 97.6 F (36.4 C) (Oral)   Resp 16   Wt 140 lb 9.6 oz (63.8 kg)   SpO2 97%   BMI 23.40 kg/m  Wt Readings from Last 3 Encounters:  08/17/18 140 lb 9.6 oz (63.8 kg)  04/16/18 137 lb 6.4 oz (62.3 kg)  04/07/18 133 lb 15.9 oz (60.8 kg)     Lab Results  Component Value Date   WBC 7.5 08/12/2018   HGB 13.9 08/12/2018   HCT 41.5 08/12/2018   PLT 300.0 08/12/2018   GLUCOSE 91 08/12/2018   CHOL 297 (H) 08/12/2018   TRIG 123.0 08/12/2018   HDL 55.20 08/12/2018   LDLDIRECT 218.0 04/14/2018   LDLCALC 218 (H) 08/12/2018   ALT 14 08/12/2018   AST 24 08/12/2018   NA 139 08/12/2018   K 4.3 08/12/2018   CL 106 08/12/2018   CREATININE 0.90 08/12/2018   BUN 18 08/12/2018   CO2 25 08/12/2018   TSH 1.17 08/12/2018   INR 1.07 07/18/2016       Assessment & Plan:   Problem List Items Addressed This Visit    Anxiety    On paxil.  Doing well on current regimen.  Doing better.  Takes xanax prn.  Follow.        Ascending aortic aneurysm (HCC)    Saw Dr Genevive Bi. Recommended f/u CTA chest in one year - due f/u 02/2019.        Carotid stenosis    Evaluated by [facility].  Continue plavix.  Recommended f/u in 2 years.  Due 01/2019.        Essential  hypertension    Blood pressure doing well.  Continue current medication regimen.  Follow pressures.  Follow metabolic panel.       Relevant Orders   Basic metabolic panel   Health care maintenance    Physical today 08/17/18.  Declines mammogram. Declines colonoscopy.  Agreeable to bone density.        History of colonic polyps    Last colonoscopy 12/2012.  Recommended f/u in 5 years.  Discussed with her today.  She declines f/u colonoscopy.        Hypercholesterolemia    Had intolerance to crestor (statin) and zetia.  Low cholesterol diet and exercise.  Discussed other treatment options given level of cholesterol.  Discussed repatha.        Relevant Orders   Hepatic function panel   Lipid panel   PMR (polymyalgia rheumatica) (HCC)    On MTX.  Stable.  Followed by rheumatology.         Other Visit Diagnoses    Estrogen deficiency    -  Primary   Relevant Orders   DG Bone Density       [name], MD

## 2018-08-18 NOTE — Telephone Encounter (Signed)
Last filled 07/21/18 last OV 08/12/18

## 2018-08-18 NOTE — Telephone Encounter (Signed)
rx ok'd for xanax #30 with one refill  

## 2018-08-19 ENCOUNTER — Encounter: Payer: Self-pay | Admitting: Internal Medicine

## 2018-08-19 NOTE — Assessment & Plan Note (Signed)
Physical today 08/17/18.  Declines mammogram. Declines colonoscopy.  Agreeable to bone density.

## 2018-08-19 NOTE — Assessment & Plan Note (Signed)
Blood pressure doing well.  Continue current medication regimen.  Follow pressures.  Follow metabolic panel.  

## 2018-08-19 NOTE — Assessment & Plan Note (Signed)
Last colonoscopy 12/2012.  Recommended f/u in 5 years.  Discussed with her today.  She declines f/u colonoscopy.

## 2018-08-19 NOTE — Assessment & Plan Note (Signed)
On paxil.  Doing well on current regimen.  Doing better.  Takes xanax prn.  Follow.

## 2018-08-19 NOTE — Assessment & Plan Note (Signed)
Evaluated by [facility].  Continue plavix.  Recommended f/u in 2 years.  Due 01/2019.

## 2018-08-19 NOTE — Assessment & Plan Note (Signed)
Saw Dr Genevive Bi. Recommended f/u CTA chest in one year - due f/u 02/2019.

## 2018-08-19 NOTE — Assessment & Plan Note (Signed)
On MTX.  Stable.  Followed by rheumatology.  

## 2018-08-19 NOTE — Assessment & Plan Note (Signed)
Had intolerance to crestor (statin) and zetia.  Low cholesterol diet and exercise.  Discussed other treatment options given level of cholesterol.  Discussed repatha.

## 2018-08-24 ENCOUNTER — Encounter: Payer: Self-pay | Admitting: Internal Medicine

## 2018-08-26 DIAGNOSIS — M1990 Unspecified osteoarthritis, unspecified site: Secondary | ICD-10-CM | POA: Diagnosis not present

## 2018-08-26 DIAGNOSIS — M353 Polymyalgia rheumatica: Secondary | ICD-10-CM | POA: Diagnosis not present

## 2018-09-09 ENCOUNTER — Ambulatory Visit
Admission: RE | Admit: 2018-09-09 | Discharge: 2018-09-09 | Disposition: A | Discharge status: Home / Self Care | Payer: PPO | Source: Ambulatory Visit | Attending: Internal Medicine | Admitting: Internal Medicine

## 2018-09-09 DIAGNOSIS — E2839 Other primary ovarian failure: Secondary | ICD-10-CM | POA: Insufficient documentation

## 2018-09-09 DIAGNOSIS — M810 Age-related osteoporosis without current pathological fracture: Secondary | ICD-10-CM | POA: Diagnosis not present

## 2018-09-09 DIAGNOSIS — Z780 Asymptomatic menopausal state: Secondary | ICD-10-CM | POA: Diagnosis not present

## 2018-09-09 DIAGNOSIS — M85852 Other specified disorders of bone density and structure, left thigh: Secondary | ICD-10-CM | POA: Diagnosis not present

## 2018-09-24 ENCOUNTER — Telehealth: Payer: Self-pay

## 2018-09-24 NOTE — Telephone Encounter (Signed)
Copied from [place] [id number]. Topic: General - Inquiry >> Sep 24, 2018 10:36 AM Richardo Priest, NT wrote: Reason for CRM: Patient returning a call from [place]. Call back is [phone].

## 2018-09-27 NOTE — Telephone Encounter (Signed)
Pt notified of bone density results and scheduled for an appt

## 2018-10-05 ENCOUNTER — Encounter: Payer: Self-pay | Admitting: Internal Medicine

## 2018-10-11 ENCOUNTER — Other Ambulatory Visit: Payer: Self-pay

## 2018-10-11 ENCOUNTER — Ambulatory Visit (INDEPENDENT_AMBULATORY_CARE_PROVIDER_SITE_OTHER): Payer: PPO | Admitting: Internal Medicine

## 2018-10-11 DIAGNOSIS — I712 Thoracic aortic aneurysm, without rupture: Secondary | ICD-10-CM

## 2018-10-11 DIAGNOSIS — E7800 Pure hypercholesterolemia, unspecified: Secondary | ICD-10-CM | POA: Diagnosis not present

## 2018-10-11 DIAGNOSIS — M353 Polymyalgia rheumatica: Secondary | ICD-10-CM

## 2018-10-11 DIAGNOSIS — I7121 Aneurysm of the ascending aorta, without rupture: Secondary | ICD-10-CM

## 2018-10-11 DIAGNOSIS — I10 Essential (primary) hypertension: Secondary | ICD-10-CM | POA: Diagnosis not present

## 2018-10-11 DIAGNOSIS — F419 Anxiety disorder, unspecified: Secondary | ICD-10-CM | POA: Diagnosis not present

## 2018-10-11 DIAGNOSIS — M810 Age-related osteoporosis without current pathological fracture: Secondary | ICD-10-CM | POA: Diagnosis not present

## 2018-10-11 DIAGNOSIS — I6523 Occlusion and stenosis of bilateral carotid arteries: Secondary | ICD-10-CM | POA: Diagnosis not present

## 2018-10-11 DIAGNOSIS — R413 Other amnesia: Secondary | ICD-10-CM

## 2018-10-11 LAB — URINALYSIS, ROUTINE W REFLEX MICROSCOPIC
Bilirubin Urine: NEGATIVE
Hgb urine dipstick: NEGATIVE
Ketones, ur: NEGATIVE
Nitrite: NEGATIVE
RBC / HPF: NONE SEEN (ref 0–?)
Specific Gravity, Urine: 1.02 (ref 1.000–1.030)
Total Protein, Urine: NEGATIVE
Urine Glucose: NEGATIVE
Urobilinogen, UA: 0.2 (ref 0.0–1.0)
pH: 7 (ref 5.0–8.0)

## 2018-10-11 LAB — BASIC METABOLIC PANEL
BUN: 16 mg/dL (ref 6–23)
CO2: 26 mEq/L (ref 19–32)
Calcium: 9.9 mg/dL (ref 8.4–10.5)
Chloride: 104 mEq/L (ref 96–112)
Creatinine, Ser: 0.83 mg/dL (ref 0.40–1.20)
GFR: 65.95 mL/min (ref >60.00)
Glucose, Bld: 90 mg/dL (ref 70–99)
Potassium: 4.6 mEq/L (ref 3.5–5.1)
Sodium: 138 mEq/L (ref 135–145)

## 2018-10-11 LAB — VITAMIN D 25 HYDROXY (VIT D DEFICIENCY, FRACTURES): VITD: 37.6 ng/mL (ref 30.00–100.00)

## 2018-10-11 NOTE — Progress Notes (Signed)
[name], female   DOB: 1935/09/03, 83 y.o.   MRN: [id number]   Subjective:    [name], female    DOB: May 24, 1935, 83 y.o.   MRN: [id number]  HPI  Patient here as a work in appt to discuss her bone density results.  Her daughter also had concerns regarding her memory.  Discussed bone denstiy results - -4.6 - radius and -2.0 - femur neck.  Discussed treatment options.  She was agreeable to reclast.  Discussed possible side effects and risk of medication, including bone pain, osteonecrosis of jaw, etc.  Discussed the need to continue weight bearing exercise, calcium and vitamin D.  Also discussed concerns regarding memory issues.  She reports her daughter has had concerns regarding her memory.  She has noticed some change, but does not feel is a significant problem.  Discussed further w/up and evaluation.  Daughter would like formal neurological evaluation - referral to neurology.  Pt in agreement.  Also discussed the need for cholesterol treatment.  Had intolerance to crestor.  Not able to take statin medication.  Discussed repatha.  Will see if can get repatha covered.     Past Medical History:  Diagnosis Date   Anxiety    Arthritis    Cataract    History of chicken pox    History of kidney stones October 2013   Hx: UTI (urinary tract infection)    Hyperlipidemia    Hypertension    Rheumatoid arthritis ([place])    Stroke ([place])    tia   Past Surgical History:  Procedure Laterality Date   ABDOMINAL HYSTERECTOMY     BREAST BIOPSY Right 1995   CATARACT EXTRACTION W/PHACO Left 04/13/2018   Procedure: CATARACT EXTRACTION PHACO AND INTRAOCULAR LENS PLACEMENT ([place]) Left Eye;  Surgeon: Birder Robson, MD;  Location: [facility] ORS;  Service: Ophthalmology;  Laterality: Left;  [place]  00:47.0 CDE 5.52 Fluid pack lot # [id number]   EYE SURGERY     IR ANGIO INTRA EXTRACRAN SEL COM CAROTID INNOMINATE BILAT MOD SED  07/18/2016   IR ANGIO VERTEBRAL SEL VERTEBRAL BILAT MOD  SED  07/18/2016   PARTIAL HYSTERECTOMY  1972   TONSILLECTOMY AND ADENOIDECTOMY  1945   Family History  Problem Relation Age of Onset   Asthma Mother    Diabetes Mother    Cataracts Mother    Heart disease Father    Diabetes Father    Hypertension Father    Cancer Paternal Aunt        Liver   Diabetes Paternal Grandmother    Diabetes Sister    Retinal degeneration Sister    Glaucoma Sister    Social History   Socioeconomic History   Marital status: Married    Spouse name: Not on file   Number of children: Not on file   Years of education: Not on file   Highest education level: Not on file  Occupational History   Not on file  Social Needs   Financial resource strain: Not hard at all   Food insecurity    Worry: Never true    Inability: Never true   Transportation needs    Medical: No    Non-medical: No  Tobacco Use   Smoking status: Never Smoker   Smokeless tobacco: Never Used  Substance and Sexual Activity   Alcohol use: No    Alcohol/week: 0.0 standard drinks   Drug use: No   Sexual activity: Not on file  Lifestyle   Physical  activity    Days per week: 7 days    Minutes per session: 30 min   Stress: Not at all  Relationships   Social connections    Talks on phone: Not on file    Gets together: Not on file    Attends religious service: Not on file    Active member of club or organization: Not on file    Attends meetings of clubs or organizations: Not on file    Relationship status: Not on file  Other Topics Concern   Not on file  Social History Narrative   Not on file    Outpatient Encounter Medications as of 10/11/2018  Medication Sig   ALPRAZolam (XANAX) 0.5 MG tablet Take 1 tablet (0.5 mg total) by mouth daily as needed for anxiety.   amLODipine (NORVASC) 5 MG tablet Take 1 tablet (5 mg total) by mouth daily.   atenolol (TENORMIN) 25 MG tablet Take 1 tablet (25 mg total) by mouth daily. Must keep appt in June for  additional refills   Bioflavonoid Products (VITAMIN C) CHEW Chew 1 tablet by mouth daily.   clopidogrel (PLAVIX) 75 MG tablet Take 1 tablet (75 mg total) by mouth daily.   folic acid (FOLVITE) 532 MCG tablet Take 800 mcg by mouth daily.    methotrexate (RHEUMATREX) 2.5 MG tablet Take 6 tablets per week. (Patient taking differently: Take 10 mg by mouth every Sunday. )   PARoxetine (PAXIL) 20 MG tablet Take 1 tablet (20 mg total) by mouth daily.   [DISCONTINUED] amoxicillin (AMOXIL) 500 MG capsule Take 1 capsule (500 mg total) by mouth 2 (two) times daily.   [DISCONTINUED] ezetimibe (ZETIA) 10 MG tablet Take 1 tablet (10 mg total) by mouth daily.   No facility-administered encounter medications on file as of 10/11/2018.     Review of Systems  Constitutional: Negative for appetite change and unexpected weight change.  HENT: Negative for congestion and sinus pressure.   Respiratory: Negative for cough, chest tightness and shortness of breath.   Cardiovascular: Negative for chest pain, palpitations and leg swelling.  Gastrointestinal: Negative for abdominal pain, diarrhea, nausea and vomiting.  Genitourinary: Negative for difficulty urinating and dysuria.  Musculoskeletal: Negative for joint swelling and myalgias.  Skin: Negative for color change and rash.  Neurological: Negative for dizziness, light-headedness and headaches.  Psychiatric/Behavioral: Negative for agitation and dysphoric mood.       Objective:    Physical Exam Constitutional:      General: She is not in acute distress.    Appearance: Normal appearance.  HENT:     Right Ear: External ear normal.     Left Ear: External ear normal.  Eyes:     General: No scleral icterus.       Right eye: No discharge.        Left eye: No discharge.     Conjunctiva/sclera: Conjunctivae normal.  Neck:     Musculoskeletal: Neck supple. No muscular tenderness.     Thyroid: No thyromegaly.  Cardiovascular:     Rate and Rhythm:  Normal rate and regular rhythm.  Pulmonary:     Effort: No respiratory distress.     Breath sounds: Normal breath sounds. No wheezing.  Abdominal:     General: Bowel sounds are normal.     Palpations: Abdomen is soft.     Tenderness: There is no abdominal tenderness.  Musculoskeletal:        General: No swelling or tenderness.  Lymphadenopathy:  Cervical: No cervical adenopathy.  Skin:    Findings: No erythema or rash.  Neurological:     Mental Status: She is alert.  Psychiatric:        Mood and Affect: Mood normal.        Behavior: Behavior normal.     BP 124/66    Pulse 63    Temp 98.1 F (36.7 C) (Oral)    Resp 16    Wt 142 lb 1.6 oz (64.5 kg)    SpO2 98%    BMI 23.65 kg/m  Wt Readings from Last 3 Encounters:  10/11/18 142 lb 1.6 oz (64.5 kg)  08/17/18 140 lb 9.6 oz (63.8 kg)  04/16/18 137 lb 6.4 oz (62.3 kg)     Lab Results  Component Value Date   WBC 7.5 08/12/2018   HGB 13.9 08/12/2018   HCT 41.5 08/12/2018   PLT 300.0 08/12/2018   GLUCOSE 90 10/11/2018   CHOL 297 (H) 08/12/2018   TRIG 123.0 08/12/2018   HDL 55.20 08/12/2018   LDLDIRECT 218.0 04/14/2018   LDLCALC 218 (H) 08/12/2018   ALT 14 08/12/2018   AST 24 08/12/2018   NA 138 10/11/2018   K 4.6 10/11/2018   CL 104 10/11/2018   CREATININE 0.83 10/11/2018   BUN 16 10/11/2018   CO2 26 10/11/2018   TSH 1.17 08/12/2018   INR 1.07 07/18/2016    Dg Bone Density  Result Date: 09/09/2018 EXAM: DUAL X-RAY ABSORPTIOMETRY (DXA) FOR BONE MINERAL DENSITY IMPRESSION: Technologist: Marquette Old Your patient Clora Ohmer completed a BMD test on 09/09/2018 using the [place] (analysis version: 14.10) manufactured by [employer]. The following summarizes the results of our evaluation. PATIENT BIOGRAPHICAL: Name: Kaliegh, Willadsen Patient ID:  [id number] Birth Date: Feb 11, 1936 Height:     65.0 in. Gender:      Female Exam Date:  09/09/2018 Weight:     142.0 lbs. Indications: Caucasian, Hysterectomy, Postmenopausal, Rheumatoid  Arthritis Fractures:             Treatments: ASSESSMENT: The BMD measured at Forearm Radius 33% is 0.475 g/cm2 with a T-score of -4.6. This patient is considered osteoporotic according to [facility]) criteria. Lumbar spine was not utilized due to advanced degenerative changes. The quality of the scan is good. Site Region Date Measured Age WHO Classification T-score Young Adult BMD Dual Femur Neck Left 09/09/2018 81.0 Osteopenia -2.0 0.754 g/cm2 Left Forearm Radius 33% 09/09/2018 81.0 Osteoporosis -4.6 0.475 g/cm2 World Health Organization [facility]) criteria for post-menopausal, Caucasian Women: Normal:       T-score at or above -1 SD Osteopenia:   T-score between -1 and -2.5 SD Osteoporosis: T-score at or below -2.5 SD RECOMMENDATIONS: 1. All patients should optimize calcium and vitamin D intake. 2. Consider FDA-approved medical therapies in postmenopausal women and men aged 29 years and older, based on the following: a. A hip or vertebral(clinical or morphometric) fracture b. T-score < -2.5 at the femoral neck or spine after appropriate evaluation to exclude secondary causes c. Low bone mass (T-score between -1.0 and -2.5 at the femoral neck or spine) and a 10-year probability of a hip fracture > 3% or a 10-year probability of a major osteoporosis-related fracture > 20% based on the [place]-adapted WHO algorithm d. Clinician judgment and/or patient preferences may indicate treatment for people with 10-year fracture probabilities above or below these levels FOLLOW-UP: People with diagnosed cases of osteoporosis or at high risk for fracture should have regular bone mineral density tests.  For patients eligible for Medicare, routine testing is allowed once every 2 years. The testing frequency can be increased to one year for patients who have rapidly progressing disease, those who are receiving or discontinuing medical therapy to restore bone mass, or have additional risk factors. I have reviewed this  report, and agree with the above findings. Electronically Signed   By: Evangeline Dakin M.D.   On: 09/09/2018 14:24       Assessment & Plan:   Problem List Items Addressed This Visit    Anxiety    On paxil.  Doing well on current regimen.  Follow.        Ascending aortic aneurysm (HCC)    Saw Dr Genevive Bi.  Recommended f/u CTA chest in one year - due 02/2019.        Carotid stenosis    Evaluated by [facility].  Continue plavix.  Recommended f/u in 2 years.  Due 01/2019      Essential hypertension    Blood pressure dong well.  Follow.  Continue current medication regimen.        Hypercholesterolemia    Had intolerance to crestor and zetia.  Discussed low cholesterol diet and exercise.  Declines statin medication.  Discussed other treatment options.  Interested in repatha.  See if can get authorization.        Memory change    Discussed with her today.  Daughter request formal neurological evaluation - referral to neurology.  Check routine labs, including B12.  Recent tsh wnl.        Relevant Orders   Ambulatory referral to Neurology   Osteoporosis    With osteoporosis.  Discussed treatment options.  She is agreeable to reclast.  Obtain labs.  Form completion.        Relevant Orders   VITAMIN D 25 Hydroxy (Vit-D Deficiency, Fractures) (Completed)   Basic metabolic panel (Completed)   Urinalysis, Routine w reflex microscopic (Completed)   PMR (polymyalgia rheumatica) (HCC)    MTX.  Doing well.  Stable.  Follow.            [name], MD

## 2018-10-12 ENCOUNTER — Encounter: Payer: Self-pay | Admitting: Internal Medicine

## 2018-10-13 ENCOUNTER — Telehealth: Payer: Self-pay | Admitting: Internal Medicine

## 2018-10-13 NOTE — Telephone Encounter (Signed)
Pt dropped off papers for Dr Nicki Reaper. Its in color folder up front. Thank you!

## 2018-10-14 NOTE — Telephone Encounter (Signed)
Reviewed

## 2018-10-14 NOTE — Telephone Encounter (Signed)
Forms in your medical records folder for review

## 2018-10-16 ENCOUNTER — Encounter: Payer: Self-pay | Admitting: Internal Medicine

## 2018-10-16 DIAGNOSIS — R413 Other amnesia: Secondary | ICD-10-CM | POA: Insufficient documentation

## 2018-10-16 NOTE — Assessment & Plan Note (Signed)
Saw Dr Genevive Bi.  Recommended f/u CTA chest in one year - due 02/2019.

## 2018-10-16 NOTE — Assessment & Plan Note (Signed)
Discussed with her today.  Daughter request formal neurological evaluation - referral to neurology.  Check routine labs, including B12.  Recent tsh wnl.

## 2018-10-16 NOTE — Assessment & Plan Note (Signed)
On paxil.  Doing well on current regimen.  Follow.

## 2018-10-16 NOTE — Assessment & Plan Note (Signed)
Evaluated by [facility].  Continue plavix.  Recommended f/u in 2 years.  Due 01/2019

## 2018-10-16 NOTE — Assessment & Plan Note (Signed)
With osteoporosis.  Discussed treatment options.  She is agreeable to reclast.  Obtain labs.  Form completion.

## 2018-10-16 NOTE — Assessment & Plan Note (Signed)
MTX.  Doing well.  Stable.  Follow.

## 2018-10-16 NOTE — Assessment & Plan Note (Signed)
Had intolerance to crestor and zetia.  Discussed low cholesterol diet and exercise.  Declines statin medication.  Discussed other treatment options.  Interested in repatha.  See if can get authorization.

## 2018-10-16 NOTE — Assessment & Plan Note (Signed)
Blood pressure dong well.  Follow.  Continue current medication regimen.

## 2018-10-18 ENCOUNTER — Other Ambulatory Visit: Payer: Self-pay | Admitting: Internal Medicine

## 2018-10-19 ENCOUNTER — Telehealth: Payer: Self-pay | Admitting: Internal Medicine

## 2018-10-19 DIAGNOSIS — E7800 Pure hypercholesterolemia, unspecified: Secondary | ICD-10-CM

## 2018-10-19 NOTE — Telephone Encounter (Signed)
-----   Message from De Hollingshead, [facility] sent at 10/18/2018  3:04 PM EDT ----- Regarding: RE: repatha Absolutely! Working on one for Dr. Caryl Bis right now.   I'll collaborate with Juliann Pulse on this if a PA is required. Catie ----- Message ----- From: [name], MD Sent: 10/18/2018   1:17 PM EDT To: De Hollingshead, RPH Subject: FW: repatha                                    Can I put this through as a referral  - CCM?  Thanks    Dr Nicki Reaper ----- Message ----- From: Nanci Pina, RN Sent: 10/18/2018   9:11 AM EDT To: [name], MD Subject: FW: repatha                                    Catie ----- Message ----- From: [name], MD Sent: 10/16/2018   9:04 PM EDT To: Nanci Pina, RN Subject: repatha                                        This pt has tried crestor and zetia.  She is interested in repatha.  Does this go through you or Catie.  Thanks.    Dr Nicki Reaper

## 2018-10-19 NOTE — Telephone Encounter (Signed)
Order placed for CCM referral.

## 2018-10-21 ENCOUNTER — Ambulatory Visit: Payer: Self-pay | Admitting: Pharmacist

## 2018-10-21 DIAGNOSIS — G459 Transient cerebral ischemic attack, unspecified: Secondary | ICD-10-CM

## 2018-10-21 DIAGNOSIS — E7800 Pure hypercholesterolemia, unspecified: Secondary | ICD-10-CM

## 2018-10-21 MED ORDER — REPATHA SURECLICK 140 MG/ML ~~LOC~~ SOAJ: 140.0000 mg | SUBCUTANEOUS | 1 refills | Status: DC

## 2018-10-21 NOTE — Progress Notes (Signed)
[name], female   DOB: 1936-01-11, 83 y.o.   MRN: [id number] Reviewed.  Agree with plan.    Dr Nicki Reaper

## 2018-10-21 NOTE — Chronic Care Management (AMB) (Signed)
Chronic Care Management   Note  10/21/2018 Name: Jaylanni Eltringham MRN: [id number] DOB: Aug 28, 1935   Subjective:  Kelbi Renstrom is a 83 y.o. year old female who is a primary care patient of [name], MD. The CCM team was consulted for assistance with chronic disease management and care coordination needs.     Ms. Menard was given information about Chronic Care Management services today including:  1. CCM service includes personalized support from designated clinical staff supervised by her physician, including individualized plan of care and coordination with other care providers 2. 24/7 contact phone numbers for assistance for urgent and routine care needs. 3. Service will only be billed when office clinical staff spend 20 minutes or more in a month to coordinate care. 4. Only one practitioner may furnish and bill the service in a calendar month. 5. The patient may stop CCM services at any time (effective at the end of the month) by phone call to the office staff. 6. The patient will be responsible for cost sharing (co-pay) of up to 20% of the service fee (after annual deductible is met).  Patient agreed to services and verbal consent obtained.   Review of patient status, including review of consultants reports, laboratory and other test data, was performed as part of comprehensive evaluation and provision of chronic care management services.   Objective:  Lab Results  Component Value Date   CREATININE 0.83 10/11/2018   CREATININE 0.90 08/12/2018   CREATININE 0.90 04/14/2018       Component Value Date/Time   CHOL 297 (H) 08/12/2018 1125   TRIG 123.0 08/12/2018 1125   HDL 55.20 08/12/2018 1125   CHOLHDL 5 08/12/2018 1125   VLDL 24.6 08/12/2018 1125   LDLCALC 218 (H) 08/12/2018 1125   LDLDIRECT 218.0 04/14/2018 1401    Clinical ASCVD: Yes - hx ASCVD  BP Readings from Last 3 Encounters:  10/11/18 124/66  08/17/18 122/64  04/16/18 120/70    Allergies  Allergen Reactions  .  Pneumovax [Pneumococcal Polysaccharide Vaccine] Swelling  . Pneumococcal Vaccine Swelling    Medications Reviewed Today    Reviewed by De Hollingshead, [facility] (Pharmacist) on 10/21/18 at 1437  Med List Status: <None>  Medication Order Taking? Sig Documenting Provider Last Dose Status Informant  ALPRAZolam (XANAX) 0.5 MG tablet [id number] Yes Take 1 tablet (0.5 mg total) by mouth daily as needed for anxiety. [name], MD Taking Active   amLODipine (NORVASC) 5 MG tablet [id number] Yes Take 1 tablet (5 mg total) by mouth daily. [name], MD Taking Active   atenolol (TENORMIN) 25 MG tablet [id number] Yes Take 1 tablet (25 mg total) by mouth daily. Must keep appt in June for additional refills [name], MD Taking Active   Bioflavonoid Products (VITAMIN C) CHEW [id number] No Chew 1 tablet by mouth daily. [provider] Not Taking Active Self  clopidogrel (PLAVIX) 75 MG tablet [id number] Yes Take 1 tablet (75 mg total) by mouth daily. [name], MD Taking Active   folic acid (FOLVITE) 793 MCG tablet [id number] Yes Take 800 mcg by mouth daily.  [provider] Taking Active Self  methotrexate (RHEUMATREX) 2.5 MG tablet [id number] No Take 6 tablets per week.  Patient not taking: Reported on 10/21/2018   [name], MD Not Taking Active Self           Med Note Tami Lin Apr 05, 2018  1:40 PM)    PARoxetine (PAXIL) 20 MG tablet [id number] Yes Take 1  tablet (20 mg total) by mouth daily. [name], MD Taking Active            Assessment:   Goals Addressed            This Visit's Progress     Patient Stated   . "I don't like having to take alprazolam every night" (pt-stated)       Current Barriers:  . Polypharmacy; complex patient with multiple comorbidities including osteoporosis, hx TIA, anxiety . Upon medication review, notes that she takes paroxetine 10 mg and alprazolam 0.5 mg every night. She worries about being addicted to  alprazolam. She notes her mother passed away after being addicted to pain medications, so this worries her.  Pharmacist Clinical Goal(s):  Marland Kitchen Over the next 90 days, patient will work with PharmD and provider towards optimized medication management  Interventions: . Comprehensive medication review performed; medication list updated in electronic medical record . Discussed with patient about alternative options for sleep. She notes she has tried melatonin before, but it provided no benefit.  . Moving forward, will further discuss potential options, as well as good sleep hygiene principals  Patient Self Care Activities:  . Patient will take medications as prescribed . Patient will focus on improved adherence  Initial goal documentation     . "I don't want to have a stroke" (pt-stated)       Current Barriers:  . Uncontrolled hyperlipidemia, complicated by hx TIA . Current antihyperlipidemic regimen: none . Previous antihyperlipidemic medications tried: rosuvastatin, atorvastatin, ezetimibe, pravastatin, simvastatin, eztimibe; all caused muscle aches, all with sufficient trials of at least 2 months . Most recent lipid panel: LDL: 218, TG: 123, TC: 297, HDL: 55 . ASCVD risk enhancing conditions: age >76, HTN,  Pharmacist Clinical Goal(s):  Marland Kitchen Over the next 60 days, patient will work with PharmD and providers towards optimized antihyperlipidemic therapy  Interventions: . Comprehensive medication review performed; medication list updated in electronic medical record.  . Discussed PCSK9 and difference from statins. Patient is amenable to starting. Will collaborate with clinic for PA.   Patient Self Care Activities:  . Patient will focus on medication adherence  Initial goal documentation        Plan: - Will collaborate with clinic staff as needed moving forward. - Will outreach patient in ~4-5 weeks for continued medication management support  Catie Darnelle Maffucci, PharmD, [place]  Pharmacist [place] [phone]

## 2018-10-21 NOTE — Patient Instructions (Signed)
Visit Information  Goals Addressed            This Visit's Progress     Patient Stated   . "I don't like having to take alprazolam every night" (pt-stated)       Current Barriers:  . Polypharmacy; complex patient with multiple comorbidities including osteoporosis, hx TIA, anxiety . Upon medication review, notes that she takes paroxetine 10 mg and alprazolam 0.5 mg every night. She worries about being addicted to alprazolam. She notes her mother passed away after being addicted to pain medications, so this worries her.  Pharmacist Clinical Goal(s):  Marland Kitchen Over the next 90 days, patient will work with PharmD and provider towards optimized medication management  Interventions: . Comprehensive medication review performed; medication list updated in electronic medical record . Discussed with patient about alternative options for sleep. She notes she has tried melatonin before, but it provided no benefit.  . Moving forward, will further discuss potential options, as well as good sleep hygiene principals  Patient Self Care Activities:  . Patient will take medications as prescribed . Patient will focus on improved adherence  Initial goal documentation     . "I don't want to have a stroke" (pt-stated)       Current Barriers:  . Uncontrolled hyperlipidemia, complicated by hx TIA . Current antihyperlipidemic regimen: none . Previous antihyperlipidemic medications tried: rosuvastatin, atorvastatin, ezetimibe, pravastatin, simvastatin, eztimibe; all caused muscle aches, all with sufficient trials of at least 2 months . Most recent lipid panel: LDL: 218, TG: 123, TC: 297, HDL: 55 . ASCVD risk enhancing conditions: age >34, HTN,  Pharmacist Clinical Goal(s):  Marland Kitchen Over the next 60 days, patient will work with PharmD and providers towards optimized antihyperlipidemic therapy  Interventions: . Comprehensive medication review performed; medication list updated in electronic medical record.   . Discussed PCSK9 and difference from statins. Patient is amenable to starting. Will collaborate with clinic for PA.   Patient Self Care Activities:  . Patient will focus on medication adherence  Initial goal documentation        The patient verbalized understanding of instructions provided today and declined a print copy of patient instruction materials.   Plan: - Will collaborate with clinic staff as needed moving forward. - Will outreach patient in ~4-5 weeks for continued medication management support  Catie Darnelle Maffucci, PharmD, [place] Pharmacist [place] [phone]

## 2018-10-25 ENCOUNTER — Telehealth: Payer: Self-pay | Admitting: Pharmacist

## 2018-10-25 NOTE — Telephone Encounter (Signed)
Spoke with [employer]; PA needed for [place].   Routing to [employer] for assistance

## 2018-10-26 ENCOUNTER — Telehealth: Payer: Self-pay | Admitting: Internal Medicine

## 2018-10-26 DIAGNOSIS — M810 Age-related osteoporosis without current pathological fracture: Secondary | ICD-10-CM

## 2018-10-26 NOTE — Telephone Encounter (Signed)
Need referral to establish for reclasp infusion °

## 2018-10-26 NOTE — Telephone Encounter (Signed)
Need to clarify what is needed.  I have completed form.  I assume they received since they are calling.  I just need to know what else is needed.  Please call [facility] and clarify.  This is given through the rheumatology clinic at [facility].

## 2018-10-27 NOTE — Telephone Encounter (Signed)
PA submitted.

## 2018-10-27 NOTE — Telephone Encounter (Signed)
Endocrinology is now doing reclast infusions instead of rheumatology. Referral needed for appt.

## 2018-10-28 NOTE — Telephone Encounter (Signed)
rder placed for endocrinology referral.

## 2018-11-01 ENCOUNTER — Ambulatory Visit: Payer: Self-pay | Admitting: Pharmacist

## 2018-11-01 DIAGNOSIS — G459 Transient cerebral ischemic attack, unspecified: Secondary | ICD-10-CM

## 2018-11-01 DIAGNOSIS — E7800 Pure hypercholesterolemia, unspecified: Secondary | ICD-10-CM

## 2018-11-01 NOTE — Progress Notes (Signed)
Reviewed.  Agree with plan.  Will d/w pt regarding f/u with cardiology vis endocrinology prescribing.  Await endocrinology appt.    Dr Nicki Reaper

## 2018-11-01 NOTE — Patient Instructions (Signed)
Visit Information  Goals Addressed            This Visit's Progress     Patient Stated   . "I don't want to have a stroke" (pt-stated)       Current Barriers:  . Uncontrolled hyperlipidemia, complicated by hx TIA o Submitted PA for Repatha, was denied, reason not noted  . Current antihyperlipidemic regimen: none . Previous antihyperlipidemic medications tried: rosuvastatin, atorvastatin, ezetimibe, pravastatin, simvastatin, eztimibe; all caused muscle aches, all with sufficient trials of at least 2 months . Most recent lipid panel: LDL: 218, TG: 123, TC: 297, HDL: 55 . ASCVD risk enhancing conditions: age >5, HTN,  Pharmacist Clinical Goal(s):  Marland Kitchen Over the next 60 days, patient will work with PharmD and providers towards optimized antihyperlipidemic therapy  Interventions: . Contacted [employer] - denial reason for prior authorization was d/t the medication not being prescribed by a cardiologist, endocrinologist, or lipid specialist. The rep also read off that the patient doesn't have clinical ASCVD, however, she does have a hx TIA. Will alert Dr. Nicki Reaper to this. Patient is in the process of establishing with endocrinology for osteoporosis treatment; will collaborate with Dr. Nicki Reaper and see if endocrinology would be open to prescribing Repatha, or if patient needs to establish with cardiology.   Patient Self Care Activities:  . Patient will focus on medication adherence  Please see past updates related to this goal by clicking on the "Past Updates" button in the selected goal         The patient verbalized understanding of instructions provided today and declined a print copy of patient instruction materials.    Plan:  - Will collaborate with primary care provider as above  Catie Darnelle Maffucci, PharmD, [place] Pharmacist [facility] [place] [phone]

## 2018-11-01 NOTE — Chronic Care Management (AMB) (Signed)
Chronic Care Management   Follow Up Note   11/01/2018 Name: Beth Espinoza MRN: [id number] DOB: 07/25/1935  Referred by: [name], MD Reason for referral : Chronic Care Management (Medication Management)   Beth Espinoza is a 83 y.o. year old female who is a primary care patient of [name], MD. The CCM team was consulted for assistance with chronic disease management and care coordination needs.    Care coordination completed today.   Review of patient status, including review of consultants reports, relevant laboratory and other test results, and collaboration with appropriate care team members and the patient's provider was performed as part of comprehensive patient evaluation and provision of chronic care management services.    SDOH (Social Determinants of Health) screening performed today: Financial Strain . See Care Plan for related entries.   Outpatient Encounter Medications as of 11/01/2018  Medication Sig  . ALPRAZolam (XANAX) 0.5 MG tablet Take 1 tablet (0.5 mg total) by mouth daily as needed for anxiety.  Marland Kitchen amLODipine (NORVASC) 5 MG tablet Take 1 tablet (5 mg total) by mouth daily.  Marland Kitchen atenolol (TENORMIN) 25 MG tablet Take 1 tablet (25 mg total) by mouth daily. Must keep appt in June for additional refills  . Bioflavonoid Products (VITAMIN C) CHEW Chew 1 tablet by mouth daily.  . clopidogrel (PLAVIX) 75 MG tablet Take 1 tablet (75 mg total) by mouth daily.  . Evolocumab (REPATHA SURECLICK) [id number] MG/ML SOAJ Inject 140 mg into the skin every 14 (fourteen) days.  . folic acid (FOLVITE) Q000111Q MCG tablet Take 800 mcg by mouth daily.   . methotrexate (RHEUMATREX) 2.5 MG tablet Take 6 tablets per week. (Patient not taking: Reported on 10/21/2018)  . PARoxetine (PAXIL) 20 MG tablet Take 1 tablet (20 mg total) by mouth daily.   No facility-administered encounter medications on file as of 11/01/2018.      Goals Addressed            This Visit's Progress     Patient Stated   . "I  don't want to have a stroke" (pt-stated)       Current Barriers:  . Uncontrolled hyperlipidemia, complicated by hx TIA o Submitted PA for Repatha, was denied, reason not noted  . Current antihyperlipidemic regimen: none . Previous antihyperlipidemic medications tried: rosuvastatin, atorvastatin, ezetimibe, pravastatin, simvastatin, eztimibe; all caused muscle aches, all with sufficient trials of at least 2 months . Most recent lipid panel: LDL: 218, TG: 123, TC: 297, HDL: 55 . ASCVD risk enhancing conditions: age >69, HTN,  Pharmacist Clinical Goal(s):  Marland Kitchen Over the next 60 days, patient will work with PharmD and providers towards optimized antihyperlipidemic therapy  Interventions: . Contacted [employer] - denial reason for prior authorization was d/t the medication not being prescribed by a cardiologist, endocrinologist, or lipid specialist. The rep also read off that the patient doesn't have clinical ASCVD, however, she does have a hx TIA. Will alert Dr. Nicki Reaper to this. Patient is in the process of establishing with endocrinology for osteoporosis treatment; will collaborate with Dr. Nicki Reaper and see if endocrinology would be open to prescribing Repatha, or if patient needs to establish with cardiology.   Patient Self Care Activities:  . Patient will focus on medication adherence  Please see past updates related to this goal by clicking on the "Past Updates" button in the selected goal          Plan:  - Will collaborate with primary care provider as above  Catie Darnelle Maffucci, PharmD, CPP  [place] [phone]

## 2018-11-09 ENCOUNTER — Other Ambulatory Visit: Payer: Self-pay | Admitting: Internal Medicine

## 2018-11-10 NOTE — Telephone Encounter (Signed)
rx ok'd for xanax #30 with one refill  

## 2018-11-11 ENCOUNTER — Telehealth: Payer: Self-pay | Admitting: Internal Medicine

## 2018-11-11 NOTE — Telephone Encounter (Signed)
[employer] is calling in because they received the appeal for pt's methotrexate (RHEUMATREX) 2.5 MG tablet  And would like to know if there are any questions?   [id number]   Ref# [id number]

## 2018-11-12 NOTE — Telephone Encounter (Signed)
Left message for patient to call back  

## 2018-11-15 DIAGNOSIS — L570 Actinic keratosis: Secondary | ICD-10-CM | POA: Diagnosis not present

## 2018-11-15 DIAGNOSIS — L918 Other hypertrophic disorders of the skin: Secondary | ICD-10-CM | POA: Diagnosis not present

## 2018-11-15 DIAGNOSIS — L700 Acne vulgaris: Secondary | ICD-10-CM | POA: Diagnosis not present

## 2018-11-15 NOTE — Telephone Encounter (Signed)
Patient is returning a call to [place].  Patient would like Trish to call her back at [phone]

## 2018-11-16 NOTE — Telephone Encounter (Signed)
Pt is going to call insurance company and see if PA is needed because she has not had any trouble getting them

## 2018-11-18 ENCOUNTER — Other Ambulatory Visit: Payer: Self-pay

## 2018-11-18 DIAGNOSIS — I712 Thoracic aortic aneurysm, without rupture, unspecified: Secondary | ICD-10-CM

## 2018-11-22 ENCOUNTER — Ambulatory Visit: Payer: PPO | Admitting: Pharmacist

## 2018-11-22 DIAGNOSIS — Z789 Other specified health status: Secondary | ICD-10-CM

## 2018-11-22 DIAGNOSIS — E7800 Pure hypercholesterolemia, unspecified: Secondary | ICD-10-CM

## 2018-11-22 DIAGNOSIS — G459 Transient cerebral ischemic attack, unspecified: Secondary | ICD-10-CM

## 2018-11-22 NOTE — Chronic Care Management (AMB) (Signed)
Chronic Care Management   Follow Up Note   11/22/2018 Name: Bellatrix Bedel MRN: [id number] DOB: 1935/10/30  Referred by: [name], MD Reason for referral : Chronic Care Management (Medication Management)   Amoya Fazzone is a 83 y.o. year old female who is a primary care patient of [name], MD. The CCM team was consulted for assistance with chronic disease management and care coordination needs.    Contacted patient for medication management follow up today.   Review of patient status, including review of consultants reports, relevant laboratory and other test results, and collaboration with appropriate care team members and the patient's provider was performed as part of comprehensive patient evaluation and provision of chronic care management services.    SDOH (Social Determinants of Health) screening performed today: Financial Strain . See Care Plan for related entries.   Advanced Directives Status: N See Care Plan and Vynca application for related entries.  Outpatient Encounter Medications as of 11/22/2018  Medication Sig Note  . ALPRAZolam (XANAX) 0.5 MG tablet Take 1 tablet (0.5 mg total) by mouth daily as needed for anxiety. 11/22/2018: QPM  . amLODipine (NORVASC) 5 MG tablet Take 1 tablet (5 mg total) by mouth daily.   Marland Kitchen atenolol (TENORMIN) 25 MG tablet Take 1 tablet (25 mg total) by mouth daily. Must keep appt in June for additional refills   . Bioflavonoid Products (VITAMIN C) CHEW Chew 1 tablet by mouth daily.   . clopidogrel (PLAVIX) 75 MG tablet Take 1 tablet (75 mg total) by mouth daily.   . folic acid (FOLVITE) Q000111Q MCG tablet Take 800 mcg by mouth daily.    . methotrexate (RHEUMATREX) 2.5 MG tablet Take 6 tablets per week. (Patient taking differently: Take 10 mg by mouth once a week. Take 6 tablets per week.)   . PARoxetine (PAXIL) 20 MG tablet Take 1 tablet (20 mg total) by mouth daily.   . Evolocumab (REPATHA SURECLICK) [id number] MG/ML SOAJ Inject 140 mg into the skin every  14 (fourteen) days. (Patient not taking: Reported on 11/22/2018)    No facility-administered encounter medications on file as of 11/22/2018.      Goals Addressed            This Visit's Progress     Patient Stated   . "I don't like having to take alprazolam every night" (pt-stated)       Current Barriers:  . Polypharmacy; complex patient with multiple comorbidities including osteoporosis, hx TIA, anxiety . Notes that she has been on paroxetine since she was age 18, but on higher doses.  . Unable to verbalize if paroxetine really controls anxiety right now . Notes that she cannot sleep without this medication. Notes that she goes to bed at a similar time every night, and doesn't look at screens while trying to fall asleep - listens to preachers. Notes that stress with her husband causes anxiety/angst and inhibits sleep  Pharmacist Clinical Goal(s):  Marland Kitchen Over the next 90 days, patient will work with PharmD and provider towards optimized medication management  Interventions: . Comprehensive medication review performed; medication list updated in electronic medical record . Reinforced principals of good sleep hygiene . Could consider changing paroxetine to alternative antidepressant/antianxiety medication if patient decides anxiety is not well controlled, though paroxetine is the most likely SSRI to cause sedation; though continue to monitor for anticholinergic side effects o Could consider augmentation w/ buspirone or hydoxyzine to reduce need for BZD  Patient Self Care Activities:  . Patient will take  medications as prescribed . Patient will focus on improved adherence  Please see past updates related to this goal by clicking on the "Past Updates" button in the selected goal      . "I don't want to have a stroke" (pt-stated)       Current Barriers:  . Uncontrolled hyperlipidemia, complicated by hx TIA o Submitted PA for Repatha, was denied, insurance requires medication to have been  prescribed by endocrinology or cardiology . Current antihyperlipidemic regimen: none . Previous antihyperlipidemic medications tried: rosuvastatin, atorvastatin, ezetimibe, pravastatin, simvastatin, eztimibe; all caused muscle aches, all with sufficient trials of at least 2 months . Most recent lipid panel: LDL: 218, TG: 123, TC: 297, HDL: 55 . ASCVD risk enhancing conditions: age >76, HTN,  Pharmacist Clinical Goal(s):  Marland Kitchen Over the next 60 days, patient will work with PharmD and providers towards optimized antihyperlipidemic therapy  Interventions: . Patient has upcoming appointment with Warnell Forester at [facility] for osteoporosis and Reclast infusion.  [facility]- left message with [facility] nurse inquiring if [facility] would be amenable to evaluating hyperlipidemia, hx TIA, and statin intolerance and prescribing Repatha for patient's significantly elevated LDL.  . Educated patient that high LDL increases risk of ASCVD, including CVA/MI. Patient was very concerned, so interested in pursuing treatment. Noted that if endocrinology was unable to prescribe Repatha, would consider referral to cardiology.   Patient Self Care Activities:  . Patient will focus on medication adherence  Please see past updates related to this goal by clicking on the "Past Updates" button in the selected goal         Plan:  - Will collaborate with [facility] endocrinology as above - Will outreach patient in the next 4-5 weeks for continued medication management support  Catie Darnelle Maffucci, PharmD, [place] Pharmacist [place] [phone]

## 2018-11-22 NOTE — Patient Instructions (Signed)
Visit Information  Goals Addressed            This Visit's Progress     Patient Stated   . "I don't like having to take alprazolam every night" (pt-stated)       Current Barriers:  . Polypharmacy; complex patient with multiple comorbidities including osteoporosis, hx TIA, anxiety . Notes that she has been on paroxetine since she was age 83, but on higher doses.  . Unable to verbalize if paroxetine really controls anxiety right now . Notes that she cannot sleep without this medication. Notes that she goes to bed at a similar time every night, and doesn't look at screens while trying to fall asleep - listens to preachers. Notes that stress with her husband causes anxiety/angst and inhibits sleep  Pharmacist Clinical Goal(s):  Marland Kitchen Over the next 90 days, patient will work with PharmD and provider towards optimized medication management  Interventions: . Comprehensive medication review performed; medication list updated in electronic medical record . Reinforced principals of good sleep hygiene . Could consider changing paroxetine to alternative antidepressant/antianxiety medication if patient decides anxiety is not well controlled, though paroxetine is the most likely SSRI to cause sedation; though continue to monitor for anticholinergic side effects o Could consider augmentation w/ buspirone or hydoxyzine to reduce need for BZD  Patient Self Care Activities:  . Patient will take medications as prescribed . Patient will focus on improved adherence  Please see past updates related to this goal by clicking on the "Past Updates" button in the selected goal      . "I don't want to have a stroke" (pt-stated)       Current Barriers:  . Uncontrolled hyperlipidemia, complicated by hx TIA o Submitted PA for Repatha, was denied, insurance requires medication to have been prescribed by endocrinology or cardiology . Current antihyperlipidemic regimen: none . Previous antihyperlipidemic medications  tried: rosuvastatin, atorvastatin, ezetimibe, pravastatin, simvastatin, eztimibe; all caused muscle aches, all with sufficient trials of at least 2 months . Most recent lipid panel: LDL: 218, TG: 123, TC: 297, HDL: 55 . ASCVD risk enhancing conditions: age >22, HTN,  Pharmacist Clinical Goal(s):  Marland Kitchen Over the next 60 days, patient will work with PharmD and providers towards optimized antihyperlipidemic therapy  Interventions: . Patient has upcoming appointment with Warnell Forester at [facility] for osteoporosis and Reclast infusion.  [facility]- left message with [facility] nurse inquiring if [facility] would be amenable to evaluating hyperlipidemia, hx TIA, and statin intolerance and prescribing Repatha for patient's significantly elevated LDL.  . Educated patient that high LDL increases risk of ASCVD, including CVA/MI. Patient was very concerned, so interested in pursuing treatment. Noted that if endocrinology was unable to prescribe Repatha, would consider referral to cardiology.   Patient Self Care Activities:  . Patient will focus on medication adherence  Please see past updates related to this goal by clicking on the "Past Updates" button in the selected goal         The patient verbalized understanding of instructions provided today and declined a print copy of patient instruction materials.   Plan:  - Will collaborate with [facility] endocrinology as above - Will outreach patient in the next 4-5 weeks for continued medication management support  Catie Darnelle Maffucci, PharmD, [place] Pharmacist [place] [phone]

## 2018-11-23 NOTE — Progress Notes (Signed)
Reviewed.  Agree with plan   Dr Cordarryl Monrreal 

## 2018-12-06 ENCOUNTER — Other Ambulatory Visit: Payer: Self-pay | Admitting: Internal Medicine

## 2018-12-07 ENCOUNTER — Other Ambulatory Visit: Payer: Self-pay | Admitting: Internal Medicine

## 2018-12-07 DIAGNOSIS — M810 Age-related osteoporosis without current pathological fracture: Secondary | ICD-10-CM | POA: Diagnosis not present

## 2018-12-14 ENCOUNTER — Ambulatory Visit: Payer: Self-pay | Admitting: *Deleted

## 2018-12-14 DIAGNOSIS — S76111A Strain of right quadriceps muscle, fascia and tendon, initial encounter: Secondary | ICD-10-CM | POA: Diagnosis not present

## 2018-12-14 NOTE — Telephone Encounter (Signed)
Called and spoke to patient.  Patient fell on Friday.  Patient said that her leg is bruised inner thigh and on top of her knee and leg is swollen.  Patient is not able to walk on leg.  Advised patient to go to [facility] for in-person evaluation today since there are no available appts today in our office.  Patient said that she wasn't sure how she would get there since she doesn't drive and her daughter is at work.  Patient was agreeable to go to UC and ask her daughter to take her when her daughter gets off of work today.    Gave patient number to [facility].  Will forward notes to Dr. Nicki Reaper for review.

## 2018-12-14 NOTE — Telephone Encounter (Signed)
Pt states she tripped after chasing a cat in her home last Friday and fell. Pt states the leg is swollen. Pt is confined to her upstairs, and uncomfortable to walk. It is very bruised. Her daughter is a Marine scientist and instructed her to call the dr to let them know.   Reason for Disposition . Large swelling or bruise > 2 inches (5 cm)  Answer Assessment - Initial Assessment Questions 1. MECHANISM: "How did the injury happen?" (e.g., twisting injury, direct blow)      Fell and hit the floor- she was injured during process of fall 2. ONSET: "When did the injury happen?" (Minutes or hours ago)      Friday afternoon 3. LOCATION: "Where is the injury located?"      R leg- knee to groin area 4. APPEARANCE of INJURY: "What does the injury look like?"  (e.g., deformity of leg)     Bruising in inside of R leg and on top of knee 5. SEVERITY: "Can you put weight on that leg?" "Can you walk?"      Patient is putting weight on L and no weight on R leg 6. SIZE: For cuts, bruises, or swelling, ask: "How large is it?" (e.g., inches or centimeters)      Bruising present, R leg is swollen  7. PAIN: "Is there pain?" If so, ask: "How bad is the pain?"  (Scale 1-10; or mild, moderate, severe)     Pain- has improved- located on outer side of injured leg- 3-4 8. TETANUS: For any breaks in the skin, ask: "When was the last tetanus booster?"     n/a 9. OTHER SYMPTOMS: "Do you have any other symptoms?"      no 10. PREGNANCY: "Is there any chance you are pregnant?" "When was your last menstrual period?"       n/a  Protocols used: LEG INJURY-A-AH

## 2018-12-14 NOTE — Telephone Encounter (Signed)
[facility] to [facility]

## 2018-12-14 NOTE — Telephone Encounter (Signed)
Agree with need for evaluation today if leg is swollen, painful and is s/p injury.

## 2018-12-16 ENCOUNTER — Ambulatory Visit: Payer: PPO | Admitting: Pharmacist

## 2018-12-16 DIAGNOSIS — Z789 Other specified health status: Secondary | ICD-10-CM

## 2018-12-16 DIAGNOSIS — E7800 Pure hypercholesterolemia, unspecified: Secondary | ICD-10-CM

## 2018-12-16 DIAGNOSIS — G459 Transient cerebral ischemic attack, unspecified: Secondary | ICD-10-CM

## 2018-12-16 NOTE — Chronic Care Management (AMB) (Signed)
Chronic Care Management   Follow Up Note   12/16/2018 Name: Beth Espinoza MRN: [id number] DOB: 02/02/1936  Referred by: [name], MD Reason for referral : Chronic Care Management (Medication Management)   Beth Espinoza is a 83 y.o. year old female who is a primary care patient of [name], MD. The CCM team was consulted for assistance with chronic disease management and care coordination needs.    Contacted patient to f/u on medication management today. Spoke with patient's daughter, Beth Espinoza.   Review of patient status, including review of consultants reports, relevant laboratory and other test results, and collaboration with appropriate care team members and the patient's provider was performed as part of comprehensive patient evaluation and provision of chronic care management services.    SDOH (Social Determinants of Health) screening performed today: None. See Care Plan for related entries.   Advanced Directives Status: N See Care Plan and Vynca application for related entries.  Outpatient Encounter Medications as of 12/16/2018  Medication Sig Note  . ALPRAZolam (XANAX) 0.5 MG tablet Take 1 tablet (0.5 mg total) by mouth daily as needed for anxiety. 11/22/2018: QPM  . amLODipine (NORVASC) 5 MG tablet Take 1 tablet (5 mg total) by mouth daily.   Marland Kitchen atenolol (TENORMIN) 25 MG tablet Take 1 tablet (25 mg total) by mouth daily. Must keep appt in June for additional refills   . Bioflavonoid Products (VITAMIN C) CHEW Chew 1 tablet by mouth daily.   . clopidogrel (PLAVIX) 75 MG tablet Take 1 tablet (75 mg total) by mouth daily.   . Evolocumab (REPATHA SURECLICK) [id number] MG/ML SOAJ Inject 140 mg into the skin every 14 (fourteen) days. (Patient not taking: Reported on 11/22/2018)   . folic acid (FOLVITE) Q000111Q MCG tablet Take 800 mcg by mouth daily.    . methotrexate (RHEUMATREX) 2.5 MG tablet Take 6 tablets per week. (Patient taking differently: Take 10 mg by mouth once a week. Take 6 tablets per  week.)   . PARoxetine (PAXIL) 20 MG tablet Take 1 tablet (20 mg total) by mouth daily.    No facility-administered encounter medications on file as of 12/16/2018.      Goals Addressed            This Visit's Progress     Patient Stated   . "I don't want to have a stroke" (pt-stated)       Current Barriers:  . Uncontrolled hyperlipidemia, complicated by hx TIA o Submitted PA for Repatha, was denied, insurance requires medication to have been prescribed by endocrinology or cardiology. Patient was to have appointment w/ endocrinology this past week  . Current antihyperlipidemic regimen: none . Previous antihyperlipidemic medications tried: rosuvastatin, atorvastatin, ezetimibe, pravastatin, simvastatin, eztimibe; all caused muscle aches, all with sufficient trials of at least 2 months . Most recent lipid panel: LDL: 218, TG: 123, TC: 297, HDL: 55 . ASCVD risk enhancing conditions: age >42, HTN,  Pharmacist Clinical Goal(s):  Marland Kitchen Over the next 60 days, patient will work with PharmD and providers towards optimized antihyperlipidemic therapy  Interventions: . Per Care Everywhere, can see that my message inquiring about Repatha being managed by endocrinology was received, and patient was rescheduled from [facility] to [name] purposely so that [name] would address patient's lipids.  Marland Kitchen Spoke with patient's daughter, Beth Espinoza. She notes that Repatha was not discussed at appointment with endocrinology. [name] note corroborates this. Per her note, follow up with patient is yearly. I will contact [facility] to follow up  about this concern, including if patient needs to be seen again in office for lipid evaluation.   Patient Self Care Activities:  . Patient will focus on medication adherence  Please see past updates related to this goal by clicking on the "Past Updates" button in the selected goal         Plan:  - Will outreach [facility] endocrinology as above  Catie Darnelle Maffucci, PharmD, [place] Pharmacist [place] [phone]

## 2018-12-16 NOTE — Patient Instructions (Signed)
Visit Information  Goals Addressed            This Visit's Progress     Patient Stated   . "I don't want to have a stroke" (pt-stated)       Current Barriers:  . Uncontrolled hyperlipidemia, complicated by hx TIA o Submitted PA for Repatha, was denied, insurance requires medication to have been prescribed by endocrinology or cardiology. Patient was to have appointment w/ endocrinology this past week  . Current antihyperlipidemic regimen: none . Previous antihyperlipidemic medications tried: rosuvastatin, atorvastatin, ezetimibe, pravastatin, simvastatin, eztimibe; all caused muscle aches, all with sufficient trials of at least 2 months . Most recent lipid panel: LDL: 218, TG: 123, TC: 297, HDL: 55 . ASCVD risk enhancing conditions: age >17, HTN,  Pharmacist Clinical Goal(s):  Marland Kitchen Over the next 60 days, patient will work with PharmD and providers towards optimized antihyperlipidemic therapy  Interventions: . Per Care Everywhere, can see that my message inquiring about Repatha being managed by endocrinology was received, and patient was rescheduled from [facility] to [name] purposely so that [name] would address patient's lipids.  Marland Kitchen Spoke with patient's daughter, [name]. She notes that Repatha was not discussed at appointment with endocrinology. [name] note corroborates this. Per her note, follow up with patient is yearly. I will contact [place] endo to follow up about this concern, including if patient needs to be seen again in office for lipid evaluation.   Patient Self Care Activities:  . Patient will focus on medication adherence  Please see past updates related to this goal by clicking on the "Past Updates" button in the selected goal         The patient verbalized understanding of instructions provided today and declined a print copy of patient instruction materials.   Plan:  - Will outreach [facility] endocrinology as above  Catie Darnelle Maffucci, PharmD, [place]  Pharmacist [place] [phone]

## 2018-12-17 ENCOUNTER — Ambulatory Visit: Payer: Self-pay | Admitting: Pharmacist

## 2018-12-17 NOTE — Progress Notes (Signed)
Reviewed information.  Agree with plan.    Dr Kein Carlberg 

## 2018-12-17 NOTE — Chronic Care Management (AMB) (Signed)
  Chronic Care Management   Note  12/17/2018 Name: Beth Espinoza MRN: [id number] DOB: 1935/09/04  Beth Espinoza is a 83 y.o. year old female who is a primary care patient of [name], MD. The CCM team was consulted for assistance with chronic disease management and care coordination needs.    Contacted [facility] endocrinology. Left message requesting a return call from [name] CMA/RN to discuss lipid management.   Follow up plan: - Will await call back from [name] team.   Courtney Heys, PharmD, CPP Clinical Pharmacist [place] [phone]

## 2018-12-18 NOTE — Progress Notes (Signed)
Reviewed.  Agree with f/u with endocrinology for repatha prescription.    Dr Nicki Reaper

## 2018-12-20 DIAGNOSIS — E7849 Other hyperlipidemia: Secondary | ICD-10-CM | POA: Diagnosis not present

## 2018-12-31 ENCOUNTER — Other Ambulatory Visit: Payer: Self-pay

## 2019-01-04 ENCOUNTER — Ambulatory Visit (INDEPENDENT_AMBULATORY_CARE_PROVIDER_SITE_OTHER): Payer: PPO

## 2019-01-04 ENCOUNTER — Ambulatory Visit (INDEPENDENT_AMBULATORY_CARE_PROVIDER_SITE_OTHER): Payer: PPO | Admitting: Internal Medicine

## 2019-01-04 ENCOUNTER — Other Ambulatory Visit: Payer: Self-pay

## 2019-01-04 DIAGNOSIS — I712 Thoracic aortic aneurysm, without rupture: Secondary | ICD-10-CM | POA: Diagnosis not present

## 2019-01-04 DIAGNOSIS — F419 Anxiety disorder, unspecified: Secondary | ICD-10-CM

## 2019-01-04 DIAGNOSIS — Z23 Encounter for immunization: Secondary | ICD-10-CM

## 2019-01-04 DIAGNOSIS — E7800 Pure hypercholesterolemia, unspecified: Secondary | ICD-10-CM | POA: Diagnosis not present

## 2019-01-04 DIAGNOSIS — I6523 Occlusion and stenosis of bilateral carotid arteries: Secondary | ICD-10-CM | POA: Diagnosis not present

## 2019-01-04 DIAGNOSIS — Z0000 Encounter for general adult medical examination without abnormal findings: Secondary | ICD-10-CM | POA: Diagnosis not present

## 2019-01-04 DIAGNOSIS — I10 Essential (primary) hypertension: Secondary | ICD-10-CM | POA: Diagnosis not present

## 2019-01-04 DIAGNOSIS — R413 Other amnesia: Secondary | ICD-10-CM | POA: Diagnosis not present

## 2019-01-04 DIAGNOSIS — M810 Age-related osteoporosis without current pathological fracture: Secondary | ICD-10-CM

## 2019-01-04 DIAGNOSIS — M353 Polymyalgia rheumatica: Secondary | ICD-10-CM

## 2019-01-04 DIAGNOSIS — I7121 Aneurysm of the ascending aorta, without rupture: Secondary | ICD-10-CM

## 2019-01-04 NOTE — Progress Notes (Addendum)
Subjective:   Beth Espinoza is a 83 y.o. female who presents for Medicare Annual (Subsequent) preventive examination.  Review of Systems:  No ROS.  Medicare Wellness Virtual Visit.  Visual/audio telehealth visit, UTA vital signs.   See social history for additional risk factors.   Cardiac Risk Factors include: advanced age (>59men, >73 women);hypertension     Objective:     Vitals: There were no vitals taken for this visit.  There is no height or weight on file to calculate BMI.  Advanced Directives 01/04/2019 04/13/2018 12/31/2017 12/30/2016 12/03/2016 08/14/2015 07/20/2015  Does Patient Have a Medical Advance Directive? Yes No Yes Yes Yes No No  Type of Product manager Power of Attorney Living will - -  Does patient want to make changes to medical advance directive? No - Patient declined - No - Patient declined No - Patient declined - - -  Copy of [place] in Chart? No - copy requested - No - copy requested No - copy requested - - -  Would patient like information on creating a medical advance directive? - No - Patient declined - - - No - patient declined information No - patient declined information    Tobacco Social History   Tobacco Use  Smoking Status Never Smoker  Smokeless Tobacco Never Used     Counseling given: Not Answered   Clinical Intake:  Pre-visit preparation completed: Yes        Diabetes: No  How often do you need to have someone help you when you read instructions, pamphlets, or other written materials from your doctor or pharmacy?: 1 - Never  Interpreter Needed?: No     Past Medical History:  Diagnosis Date  . Anxiety   . Arthritis   . Cataract   . History of chicken pox   . History of kidney stones October 2013  . Hx: UTI (urinary tract infection)   . Hyperlipidemia   . Hypertension   . Rheumatoid arthritis ([place])   . Stroke [facility])    tia   Past  Surgical History:  Procedure Laterality Date  . ABDOMINAL HYSTERECTOMY    . BREAST BIOPSY Right 1995  . CATARACT EXTRACTION W/PHACO Left 04/13/2018   Procedure: CATARACT EXTRACTION PHACO AND INTRAOCULAR LENS PLACEMENT ([place]) Left Eye;  Surgeon: Birder Robson, MD;  Location: [facility] ORS;  Service: Ophthalmology;  Laterality: Left;  [place]  00:47.0 CDE 5.52 Fluid pack lot # [id number]  . EYE SURGERY    . IR ANGIO INTRA EXTRACRAN SEL COM CAROTID INNOMINATE BILAT MOD SED  07/18/2016  . IR ANGIO VERTEBRAL SEL VERTEBRAL BILAT MOD SED  07/18/2016  . PARTIAL HYSTERECTOMY  1972  . TONSILLECTOMY AND ADENOIDECTOMY  1945   Family History  Problem Relation Age of Onset  . Asthma Mother   . Diabetes Mother   . Cataracts Mother   . Heart disease Father   . Diabetes Father   . Hypertension Father   . Cancer Paternal Aunt        Liver  . Diabetes Paternal Grandmother   . Diabetes Sister   . Retinal degeneration Sister   . Glaucoma Sister    Social History   Socioeconomic History  . Marital status: Married    Spouse name: Not on file  . Number of children: Not on file  . Years of education: Not on file  . Highest education level: Not on file  Occupational History  .  Not on file  Social Needs  . Financial resource strain: Not hard at all  . Food insecurity    Worry: Never true    Inability: Never true  . Transportation needs    Medical: No    Non-medical: No  Tobacco Use  . Smoking status: Never Smoker  . Smokeless tobacco: Never Used  Substance and Sexual Activity  . Alcohol use: No    Alcohol/week: 0.0 standard drinks  . Drug use: No  . Sexual activity: Not on file  Lifestyle  . Physical activity    Days per week: 7 days    Minutes per session: 30 min  . Stress: Not at all  Relationships  . Social Herbalist on phone: Not on file    Gets together: Not on file    Attends religious service: Not on file    Active member of club or organization: Not on file    Attends  meetings of clubs or organizations: Not on file    Relationship status: Not on file  Other Topics Concern  . Not on file  Social History Narrative  . Not on file    Outpatient Encounter Medications as of 01/04/2019  Medication Sig  . ALPRAZolam (XANAX) 0.5 MG tablet Take 1 tablet (0.5 mg total) by mouth daily as needed for anxiety.  Marland Kitchen amLODipine (NORVASC) 5 MG tablet Take 1 tablet (5 mg total) by mouth daily.  Marland Kitchen atenolol (TENORMIN) 25 MG tablet Take 1 tablet (25 mg total) by mouth daily. Must keep appt in June for additional refills  . Bioflavonoid Products (VITAMIN C) CHEW Chew 1 tablet by mouth daily.  . clopidogrel (PLAVIX) 75 MG tablet Take 1 tablet (75 mg total) by mouth daily.  . Evolocumab (REPATHA SURECLICK) [id number] MG/ML SOAJ Inject 140 mg into the skin every 14 (fourteen) days. (Patient not taking: Reported on 11/22/2018)  . folic acid (FOLVITE) Q000111Q MCG tablet Take 800 mcg by mouth daily.   . methotrexate (RHEUMATREX) 2.5 MG tablet Take 6 tablets per week. (Patient taking differently: Take 10 mg by mouth once a week. Take 6 tablets per week.)  . PARoxetine (PAXIL) 20 MG tablet Take 1 tablet (20 mg total) by mouth daily.   No facility-administered encounter medications on file as of 01/04/2019.     Activities of Daily Living In your present state of health, do you have any difficulty performing the following activities: 01/04/2019  Hearing? N  Vision? N  Difficulty concentrating or making decisions? Y  Walking or climbing stairs? N  Dressing or bathing? N  Doing errands, shopping? N  Preparing Food and eating ? N  Using the Toilet? N  In the past six months, have you accidently leaked urine? N  Do you have problems with loss of bowel control? N  Managing your Medications? Y  Comment Managed by daughter  Managing your Finances? N  Housekeeping or managing your Housekeeping? N  Some recent data might be hidden    Patient Care Team: [name], MD as PCP - General  (Internal Medicine) De Hollingshead, [facility] as Pharmacist (Pharmacist)    Assessment:   This is a routine wellness examination for Beth Espinoza.  Nurse connected with patient 01/04/19 at 11:30 AM EST by a telephone enabled telemedicine application and verified that I am speaking with the correct person using two identifiers. Patient stated full name and DOB. Patient gave permission to continue with virtual visit. Patient's location was at home and Nurse's location  was at [facility] office.   Health Maintenance Due: -Influenza vaccine 2020- discussed; to be completed in season with doctor or local pharmacy.   See completed HM at the end of note.   Eye: Visual acuity not assessed. Virtual visit. Wears corrective lenses. Followed by their ophthalmologist every 12 months. Cataract extracted, bilateral.  Dental: UTD  Hearing: Demonstrates normal hearing during visit.  Safety:  Patient feels safe at home- yes Patient does have smoke detectors at home- yes Patient does wear sunscreen or protective clothing when in direct sunlight - yes Patient does wear seat belt when in a moving vehicle - yes Patient drives- yes Adequate lighting in walkways free from debris- yes Grab bars and handrails used as appropriate- yes Ambulates with cane as needed as an assistive device Cell phone on person when ambulating outside of the home- yes  Social: Alcohol intake - no    Smoking history- never   Smokers in home? none Illicit drug use? none  Depression: PHQ 2 &9 complete. See screening below. Denies irritability, anhedonia, sadness/tearfullness.  Stable.   Falls: See screening below.    Medication: Taking as directed and without issues.   Covid-19: Precautions and sickness symptoms discussed. Wears mask, social distancing, hand hygiene as appropriate.   Activities of Daily Living Patient denies needing assistance with: household chores, feeding themselves, getting from bed to chair, getting to the  toilet, bathing/showering, dressing, managing money, or preparing meals.   Memory: Patient is alert. Patient denies difficulty focusing or concentrating.  Correctly identified the president of the [place], season and recall.  Patient likes to play words with friends for brain stimulation.   BMI- discussed the importance of a healthy diet, water intake and the benefits of aerobic exercise.  Educational material provided.  Physical activity- walking  Diet:  Regular Water: 64 ounces daily  Other Providers Patient Care Team: [name], MD as PCP - General (Internal Medicine) De Hollingshead, [facility] as Pharmacist (Pharmacist)  Exercise Activities and Dietary recommendations    Goals      Patient Stated   . "I don't like having to take alprazolam every night" (pt-stated)     Current Barriers:  . Polypharmacy; complex patient with multiple comorbidities including osteoporosis, hx TIA, anxiety . Notes that she has been on paroxetine since she was age 53, but on higher doses.  . Unable to verbalize if paroxetine really controls anxiety right now . Notes that she cannot sleep without this medication. Notes that she goes to bed at a similar time every night, and doesn't look at screens while trying to fall asleep - listens to preachers. Notes that stress with her husband causes anxiety/angst and inhibits sleep  Pharmacist Clinical Goal(s):  Marland Kitchen Over the next 90 days, patient will work with PharmD and provider towards optimized medication management  Interventions: . Comprehensive medication review performed; medication list updated in electronic medical record . Reinforced principals of good sleep hygiene . Could consider changing paroxetine to alternative antidepressant/antianxiety medication if patient decides anxiety is not well controlled, though paroxetine is the most likely SSRI to cause sedation; though continue to monitor for anticholinergic side effects o Could consider  augmentation w/ buspirone or hydoxyzine to reduce need for BZD  Patient Self Care Activities:  . Patient will take medications as prescribed . Patient will focus on improved adherence  Please see past updates related to this goal by clicking on the "Past Updates" button in the selected goal      . "I  don't want to have a stroke" (pt-stated)     Current Barriers:  . Uncontrolled hyperlipidemia, complicated by hx TIA o Submitted PA for Repatha, was denied, insurance requires medication to have been prescribed by endocrinology or cardiology. Patient was to have appointment w/ endocrinology this past week  . Current antihyperlipidemic regimen: none . Previous antihyperlipidemic medications tried: rosuvastatin, atorvastatin, ezetimibe, pravastatin, simvastatin, eztimibe; all caused muscle aches, all with sufficient trials of at least 2 months . Most recent lipid panel: LDL: 218, TG: 123, TC: 297, HDL: 55 . ASCVD risk enhancing conditions: age >36, HTN,  Pharmacist Clinical Goal(s):  Marland Kitchen Over the next 60 days, patient will work with PharmD and providers towards optimized antihyperlipidemic therapy  Interventions: . Per Care Everywhere, can see that my message inquiring about Repatha being managed by endocrinology was received, and patient was rescheduled from [facility] to [name] purposely so that [name] would address patient's lipids.  Marland Kitchen Spoke with patient's daughter, [name]. She notes that Repatha was not discussed at appointment with endocrinology. [name] note corroborates this. Per her note, follow up with patient is yearly. I will contact [place] endo to follow up about this concern, including if patient needs to be seen again in office for lipid evaluation.   Patient Self Care Activities:  . Patient will focus on medication adherence  Please see past updates related to this goal by clicking on the "Past Updates" button in the selected goal         Fall Risk Fall Risk   01/04/2019 07/09/2018 01/15/2018 12/31/2017 12/14/2017  Falls in the past year? 1 0 1 No No  Number falls in past yr: 1 0 - - -  Injury with Fall? 1 - - - -  Comment Leg muscle strain. Followed by pcp. - - - -  Follow up - Falls evaluation completed - - -   Timed Get Up and Go performed: no, virtual visit  Depression Screen PHQ 2/9 Scores 01/04/2019 07/09/2018 12/31/2017 12/14/2017  PHQ - 2 Score 1 0 0 0  PHQ- 9 Score - 2 - -     Cognitive Function MMSE - Mini Mental State Exam 12/30/2016  Orientation to time 5  Orientation to Place 5  Registration 3  Attention/ Calculation 5  Recall 3  Language- name 2 objects 2  Language- repeat 1  Language- follow 3 step command 3  Language- read & follow direction 1  Write a sentence 1  Copy design 1  Total score 30     6CIT Screen 01/04/2019 12/31/2017  What Year? 0 points 0 points  What month? 0 points 0 points  What time? 0 points 0 points  Count back from 20 0 points 0 points  Months in reverse 0 points 0 points  Repeat phrase 0 points 0 points  Total Score 0 0    Immunization History  Administered Date(s) Administered  . Influenza Split 12/20/2013  . Influenza,inj,Quad PF,6+ Mos 01/31/2013, 12/31/2017  . Influenza-Unspecified 12/01/2013, 12/13/2014, 12/23/2016  . Tdap 08/11/2017   Screening Tests Health Maintenance  Topic Date Due  . INFLUENZA VACCINE  01/21/2019 (Originally 10/02/2018)  . MAMMOGRAM  02/05/2025 (Originally 03/09/2014)  . TETANUS/TDAP  08/12/2027  . DEXA SCAN  Completed  . PNA vac Low Risk Adult  Discontinued      Plan:   Keep all routine maintenance appointments.   Follow up  with your doctor today at 2:00  Medicare Attestation I have personally reviewed: The patient's medical and  social history Their use of alcohol, tobacco or illicit drugs Their current medications and supplements The patient's functional ability including ADLs,fall risks, home safety risks, cognitive, and hearing and visual  impairment Diet and physical activities Evidence for depression   In addition, I have reviewed and discussed with patient certain preventive protocols, quality metrics, and best practice recommendations. A written personalized care plan for preventive services as well as general preventive health recommendations were provided to patient via mail.     Varney Biles, LPN  [id number]   Reviewed above information.  Agree with assessment and plan.   Dr Nicki Reaper

## 2019-01-04 NOTE — Progress Notes (Signed)
[name], female   DOB: 10/27/1935, 83 y.o.   MRN: [id number]   Subjective:    [name], female    DOB: 05/18/1935, 83 y.o.   MRN: [id number]  HPI  Patient here for a scheduled follow up.  She reports she is doing relatively well.  Stays active.  Denies any chest pain.  No sob.  No acid reflux.  No abdominal pain.  Bowels moving.  On paxil for anxiety.  Overall she feels she is handling things relatively well.  Receiving reclast - through endocrinology.  Trying to get repatha authorized.  Has to go through endocrinology. Has seen [facility] to follow - carotid stenosis.  Is s/p bilateral carotid artery duplex - 1-39% - bilateral internal carotid artery.  No change.  Recommended f/u in 2 years.  Also had f/u regarding her ascending aortic aneurysm - stable.  Recommended f/u in one year.  Some increased stress.  Discussed with her today.  Does not feel needs anything more at this time.  She is also reports daughter is concerned about her memory change.  She does not feel this is a significant issue for her currently.  Reports daughter has noticed, she does not remember things as well.  Has previously been on MTX.  Off now.  Sees rheumatology.  Due f/u.  Wants scheduled.     Past Medical History:  Diagnosis Date  . Anxiety   . Arthritis   . Cataract   . History of chicken pox   . History of kidney stones October 2013  . Hx: UTI (urinary tract infection)   . Hyperlipidemia   . Hypertension   . Rheumatoid arthritis ([place])   . Stroke [facility])    tia   Past Surgical History:  Procedure Laterality Date  . ABDOMINAL HYSTERECTOMY    . BREAST BIOPSY Right 1995  . CATARACT EXTRACTION W/PHACO Left 04/13/2018   Procedure: CATARACT EXTRACTION PHACO AND INTRAOCULAR LENS PLACEMENT ([place]) Left Eye;  Surgeon: Birder Robson, MD;  Location: [facility] ORS;  Service: Ophthalmology;  Laterality: Left;  [place]  00:47.0 CDE 5.52 Fluid pack lot # [id number]  . EYE SURGERY    . IR ANGIO INTRA EXTRACRAN SEL COM  CAROTID INNOMINATE BILAT MOD SED  07/18/2016  . IR ANGIO VERTEBRAL SEL VERTEBRAL BILAT MOD SED  07/18/2016  . PARTIAL HYSTERECTOMY  1972  . TONSILLECTOMY AND ADENOIDECTOMY  1945   Family History  Problem Relation Age of Onset  . Asthma Mother   . Diabetes Mother   . Cataracts Mother   . Heart disease Father   . Diabetes Father   . Hypertension Father   . Cancer Paternal Aunt        Liver  . Diabetes Paternal Grandmother   . Diabetes Sister   . Retinal degeneration Sister   . Glaucoma Sister    Social History   Socioeconomic History  . Marital status: Married    Spouse name: Not on file  . Number of children: Not on file  . Years of education: Not on file  . Highest education level: Not on file  Occupational History  . Not on file  Social Needs  . Financial resource strain: Not hard at all  . Food insecurity    Worry: Never true    Inability: Never true  . Transportation needs    Medical: No    Non-medical: No  Tobacco Use  . Smoking status: Never Smoker  . Smokeless tobacco: Never Used  Substance and Sexual Activity  . Alcohol use: No    Alcohol/week: 0.0 standard drinks  . Drug use: No  . Sexual activity: Not on file  Lifestyle  . Physical activity    Days per week: 7 days    Minutes per session: 30 min  . Stress: Not at all  Relationships  . Social Herbalist on phone: Not on file    Gets together: Not on file    Attends religious service: Not on file    Active member of club or organization: Not on file    Attends meetings of clubs or organizations: Not on file    Relationship status: Not on file  Other Topics Concern  . Not on file  Social History Narrative  . Not on file    Outpatient Encounter Medications as of 01/04/2019  Medication Sig  . ALPRAZolam (XANAX) 0.5 MG tablet Take 1 tablet (0.5 mg total) by mouth daily as needed for anxiety.  Marland Kitchen amLODipine (NORVASC) 5 MG tablet Take 1 tablet (5 mg total) by mouth daily.  Marland Kitchen Bioflavonoid  Products (VITAMIN C) CHEW Chew 1 tablet by mouth daily.  . clopidogrel (PLAVIX) 75 MG tablet Take 1 tablet (75 mg total) by mouth daily.  . Evolocumab (REPATHA SURECLICK) [id number] MG/ML SOAJ Inject 140 mg into the skin every 14 (fourteen) days. (Patient not taking: Reported on 11/22/2018)  . folic acid (FOLVITE) Q000111Q MCG tablet Take 800 mcg by mouth daily.   . methotrexate (RHEUMATREX) 2.5 MG tablet Take 6 tablets per week. (Patient taking differently: Take 10 mg by mouth once a week. Take 6 tablets per week.)  . PARoxetine (PAXIL) 20 MG tablet Take 1 tablet (20 mg total) by mouth daily.  . [DISCONTINUED] atenolol (TENORMIN) 25 MG tablet Take 1 tablet (25 mg total) by mouth daily. Must keep appt in June for additional refills   No facility-administered encounter medications on file as of 01/04/2019.    Review of Systems  Constitutional: Negative for appetite change and unexpected weight change.  HENT: Negative for congestion and sinus pressure.   Respiratory: Negative for cough, chest tightness and shortness of breath.   Cardiovascular: Negative for chest pain, palpitations and leg swelling.  Gastrointestinal: Negative for abdominal pain, diarrhea and nausea.  Genitourinary: Negative for difficulty urinating and dysuria.  Musculoskeletal: Negative for joint swelling and myalgias.  Skin: Negative for color change and rash.  Neurological: Negative for dizziness, light-headedness and headaches.  Psychiatric/Behavioral: Negative for agitation and dysphoric mood.       Objective:    Physical Exam Constitutional:      General: She is not in acute distress.    Appearance: Normal appearance.  HENT:     Head: Normocephalic and atraumatic.     Right Ear: External ear normal.     Left Ear: External ear normal.  Eyes:     General: No scleral icterus.       Right eye: No discharge.        Left eye: No discharge.     Conjunctiva/sclera: Conjunctivae normal.  Neck:     Musculoskeletal: Neck supple.  No muscular tenderness.     Thyroid: No thyromegaly.  Cardiovascular:     Rate and Rhythm: Normal rate and regular rhythm.  Pulmonary:     Effort: No respiratory distress.     Breath sounds: Normal breath sounds. No wheezing.  Abdominal:     General: Bowel sounds are normal.     Palpations: Abdomen  is soft.     Tenderness: There is no abdominal tenderness.  Musculoskeletal:        General: No swelling or tenderness.  Lymphadenopathy:     Cervical: No cervical adenopathy.  Skin:    Findings: No erythema or rash.  Neurological:     Mental Status: She is alert.  Psychiatric:        Mood and Affect: Mood normal.        Behavior: Behavior normal.     BP (!) 144/76   Pulse 68   Temp (!) 97.2 F (36.2 C)   Resp 16   Wt 140 lb (63.5 kg)   SpO2 98%   BMI 23.30 kg/m  Wt Readings from Last 3 Encounters:  01/04/19 140 lb (63.5 kg)  10/11/18 142 lb 1.6 oz (64.5 kg)  08/17/18 140 lb 9.6 oz (63.8 kg)     Lab Results  Component Value Date   WBC 7.5 08/12/2018   HGB 13.9 08/12/2018   HCT 41.5 08/12/2018   PLT 300.0 08/12/2018   GLUCOSE 90 10/11/2018   CHOL 297 (H) 08/12/2018   TRIG 123.0 08/12/2018   HDL 55.20 08/12/2018   LDLDIRECT 218.0 04/14/2018   LDLCALC 218 (H) 08/12/2018   ALT 14 08/12/2018   AST 24 08/12/2018   NA 138 10/11/2018   K 4.6 10/11/2018   CL 104 10/11/2018   CREATININE 0.83 10/11/2018   BUN 16 10/11/2018   CO2 26 10/11/2018   TSH 1.17 08/12/2018   INR 1.07 07/18/2016    Dg Bone Density  Result Date: 09/09/2018 EXAM: DUAL X-RAY ABSORPTIOMETRY (DXA) FOR BONE MINERAL DENSITY IMPRESSION: Technologist: Marquette Old Your patient Sakara Loree completed a BMD test on 09/09/2018 using the [place] (analysis version: 14.10) manufactured by [employer]. The following summarizes the results of our evaluation. PATIENT BIOGRAPHICAL: Name: Sonita, Foos Patient ID:  [id number] Birth Date: [birth year] Height:     65.0 in. Gender:      Female Exam Date:  09/09/2018  Weight:     142.0 lbs. Indications: Caucasian, Hysterectomy, Postmenopausal, Rheumatoid Arthritis Fractures:             Treatments: ASSESSMENT: The BMD measured at Forearm Radius 33% is 0.475 g/cm2 with a T-score of -4.6. This patient is considered osteoporotic according to [place] [facility]) criteria. Lumbar spine was not utilized due to advanced degenerative changes. The quality of the scan is good. Site Region Date Measured Age WHO Classification T-score Young Adult BMD Dual Femur Neck Left 09/09/2018 83.0 Osteopenia -2.0 0.754 g/cm2 Left Forearm Radius 33% 09/09/2018 81.0 Osteoporosis -4.6 0.475 g/cm2 World Health Organization [facility]) criteria for post-menopausal, Caucasian Women: Normal:       T-score at or above -1 SD Osteopenia:   T-score between -1 and -2.5 SD Osteoporosis: T-score at or below -2.5 SD RECOMMENDATIONS: 1. All patients should optimize calcium and vitamin D intake. 2. Consider FDA-approved medical therapies in postmenopausal women and men aged 60 years and older, based on the following: a. A hip or vertebral(clinical or morphometric) fracture b. T-score < -2.5 at the femoral neck or spine after appropriate evaluation to exclude secondary causes c. Low bone mass (T-score between -1.0 and -2.5 at the femoral neck or spine) and a 10-year probability of a hip fracture > 3% or a 10-year probability of a major osteoporosis-related fracture > 20% based on the [place]-adapted WHO algorithm d. Clinician judgment and/or patient preferences may indicate treatment for people with 10-year fracture probabilities above or  below these levels FOLLOW-UP: People with diagnosed cases of osteoporosis or at high risk for fracture should have regular bone mineral density tests. For patients eligible for Medicare, routine testing is allowed once every 2 years. The testing frequency can be increased to one year for patients who have rapidly progressing disease, those who are receiving or discontinuing medical  therapy to restore bone mass, or have additional risk factors. I have reviewed this report, and agree with the above findings. Electronically Signed   By: Evangeline Dakin M.D.   On: 09/09/2018 14:24       Assessment & Plan:   Problem List Items Addressed This Visit    Anxiety    On paxil. She feels she is handling things relatively well.  Follow.       Ascending aortic aneurysm (HCC)    Saw Dr Genevive Bi.  Recommended f/u CTA chest in one year.  Scheduled for CT chest 01/13/19.        Carotid stenosis    Evaluated by [facility].  Continue plavix.  Recommended f/u in 2 years.  Due 01/2019.        Relevant Orders   Ambulatory referral to Vascular Surgery   Essential hypertension    Blood pressure has been under reasonable control. Continue current medication.  Have her spot check her pressure.  Follow pressures.  Follow metabolic panel.       Hypercholesterolemia    Had intolerance to crestor and zetia.  Trying to get repatha authorized.        Memory change    Discussed with her again today.  See last note.  Discussed neurological evaluation.        Osteoporosis    Receiving reclast.        PMR (polymyalgia rheumatica) (HCC)    Has seen Dr Jefm Bryant.  Overdue f/u.  Schedule.        Relevant Orders   Ambulatory referral to Rheumatology    Other Visit Diagnoses    Need for immunization against influenza       Relevant Orders   Flu Vaccine QUAD 36+ mos IM (Completed)       [name], MD

## 2019-01-04 NOTE — Patient Instructions (Addendum)
Beth Espinoza , Thank you for taking time to come for your Medicare Wellness Visit. I appreciate your ongoing commitment to your health goals. Please review the following plan we discussed and let me know if I can assist you in the future.   These are the goals we discussed: Goals      Patient Stated   . "I don't like having to take alprazolam every night" (pt-stated)     Current Barriers:  . Polypharmacy; complex patient with multiple comorbidities including osteoporosis, hx TIA, anxiety . Notes that she has been on paroxetine since she was age 21, but on higher doses.  . Unable to verbalize if paroxetine really controls anxiety right now . Notes that she cannot sleep without this medication. Notes that she goes to bed at a similar time every night, and doesn't look at screens while trying to fall asleep - listens to preachers. Notes that stress with her husband causes anxiety/angst and inhibits sleep  Pharmacist Clinical Goal(s):  Marland Kitchen Over the next 90 days, patient will work with PharmD and provider towards optimized medication management  Interventions: . Comprehensive medication review performed; medication list updated in electronic medical record . Reinforced principals of good sleep hygiene . Could consider changing paroxetine to alternative antidepressant/antianxiety medication if patient decides anxiety is not well controlled, though paroxetine is the most likely SSRI to cause sedation; though continue to monitor for anticholinergic side effects o Could consider augmentation w/ buspirone or hydoxyzine to reduce need for BZD  Patient Self Care Activities:  . Patient will take medications as prescribed . Patient will focus on improved adherence  Please see past updates related to this goal by clicking on the "Past Updates" button in the selected goal      . "I don't want to have a stroke" (pt-stated)     Current Barriers:  . Uncontrolled hyperlipidemia, complicated by hx  TIA o Submitted PA for Repatha, was denied, insurance requires medication to have been prescribed by endocrinology or cardiology. Patient was to have appointment w/ endocrinology this past week  . Current antihyperlipidemic regimen: none . Previous antihyperlipidemic medications tried: rosuvastatin, atorvastatin, ezetimibe, pravastatin, simvastatin, eztimibe; all caused muscle aches, all with sufficient trials of at least 2 months . Most recent lipid panel: LDL: 218, TG: 123, TC: 297, HDL: 55 . ASCVD risk enhancing conditions: age >89, HTN,  Pharmacist Clinical Goal(s):  Marland Kitchen Over the next 60 days, patient will work with PharmD and providers towards optimized antihyperlipidemic therapy  Interventions: . Per Care Everywhere, can see that my message inquiring about Repatha being managed by endocrinology was received, and patient was rescheduled from [facility] to [name] purposely so that [name] would address patient's lipids.  Marland Kitchen Spoke with patient's daughter, Beth Espinoza. She notes that Repatha was not discussed at appointment with endocrinology. [name] note corroborates this. Per her note, follow up with patient is yearly. I will contact [facility] to follow up about this concern, including if patient needs to be seen again in office for lipid evaluation.   Patient Self Care Activities:  . Patient will focus on medication adherence  Please see past updates related to this goal by clicking on the "Past Updates" button in the selected goal         This is a list of the screening recommended for you and due dates:  Health Maintenance  Topic Date Due  . Flu Shot  01/21/2019*  . Mammogram  02/05/2025*  . Tetanus Vaccine  08/12/2027  . DEXA scan (bone density measurement)  Completed  . Pneumonia vaccines  Discontinued  *Topic was postponed. The date shown is not the original due date.

## 2019-01-05 ENCOUNTER — Other Ambulatory Visit: Payer: Self-pay | Admitting: Internal Medicine

## 2019-01-05 ENCOUNTER — Encounter: Payer: Self-pay | Admitting: Internal Medicine

## 2019-01-06 ENCOUNTER — Encounter: Payer: Self-pay | Admitting: Internal Medicine

## 2019-01-06 NOTE — Assessment & Plan Note (Signed)
Discussed with her again today.  See last note.  Discussed neurological evaluation.

## 2019-01-06 NOTE — Assessment & Plan Note (Signed)
Has seen Dr Jefm Bryant.  Overdue f/u.  Schedule.

## 2019-01-06 NOTE — Assessment & Plan Note (Signed)
Receiving reclast.  °

## 2019-01-06 NOTE — Assessment & Plan Note (Signed)
Had intolerance to crestor and zetia.  Trying to get repatha authorized.

## 2019-01-06 NOTE — Assessment & Plan Note (Signed)
Evaluated by [facility].  Continue plavix.  Recommended f/u in 2 years.  Due 01/2019 

## 2019-01-06 NOTE — Assessment & Plan Note (Signed)
Saw Dr Genevive Bi.  Recommended f/u CTA chest in one year.  Scheduled for CT chest 01/13/19.

## 2019-01-06 NOTE — Assessment & Plan Note (Signed)
Blood pressure has been under reasonable control. Continue current medication.  Have her spot check her pressure.  Follow pressures.  Follow metabolic panel.

## 2019-01-06 NOTE — Assessment & Plan Note (Signed)
On paxil. She feels she is handling things relatively well.  Follow.

## 2019-01-11 ENCOUNTER — Ambulatory Visit: Payer: PPO | Admitting: Internal Medicine

## 2019-01-13 ENCOUNTER — Other Ambulatory Visit: Payer: Self-pay

## 2019-01-13 ENCOUNTER — Ambulatory Visit
Admission: RE | Admit: 2019-01-13 | Discharge: 2019-01-13 | Disposition: A | Discharge status: Home / Self Care | Payer: PPO | Source: Ambulatory Visit | Attending: Cardiothoracic Surgery | Admitting: Cardiothoracic Surgery

## 2019-01-13 DIAGNOSIS — Z887 Allergy status to serum and vaccine status: Secondary | ICD-10-CM | POA: Diagnosis not present

## 2019-01-13 DIAGNOSIS — R0902 Hypoxemia: Secondary | ICD-10-CM | POA: Diagnosis not present

## 2019-01-13 DIAGNOSIS — Z7902 Long term (current) use of antithrombotics/antiplatelets: Secondary | ICD-10-CM | POA: Diagnosis not present

## 2019-01-13 DIAGNOSIS — R4182 Altered mental status, unspecified: Secondary | ICD-10-CM | POA: Diagnosis not present

## 2019-01-13 DIAGNOSIS — M549 Dorsalgia, unspecified: Secondary | ICD-10-CM | POA: Diagnosis not present

## 2019-01-13 DIAGNOSIS — E559 Vitamin D deficiency, unspecified: Secondary | ICD-10-CM | POA: Diagnosis not present

## 2019-01-13 DIAGNOSIS — M810 Age-related osteoporosis without current pathological fracture: Secondary | ICD-10-CM | POA: Diagnosis not present

## 2019-01-13 DIAGNOSIS — M546 Pain in thoracic spine: Secondary | ICD-10-CM | POA: Diagnosis not present

## 2019-01-13 DIAGNOSIS — I712 Thoracic aortic aneurysm, without rupture, unspecified: Secondary | ICD-10-CM

## 2019-01-13 DIAGNOSIS — I6529 Occlusion and stenosis of unspecified carotid artery: Secondary | ICD-10-CM | POA: Diagnosis not present

## 2019-01-13 DIAGNOSIS — Z87442 Personal history of urinary calculi: Secondary | ICD-10-CM | POA: Diagnosis not present

## 2019-01-13 DIAGNOSIS — F05 Delirium due to known physiological condition: Secondary | ICD-10-CM | POA: Diagnosis not present

## 2019-01-13 DIAGNOSIS — Z90710 Acquired absence of both cervix and uterus: Secondary | ICD-10-CM | POA: Diagnosis not present

## 2019-01-13 DIAGNOSIS — I6523 Occlusion and stenosis of bilateral carotid arteries: Secondary | ICD-10-CM | POA: Diagnosis not present

## 2019-01-13 DIAGNOSIS — M353 Polymyalgia rheumatica: Secondary | ICD-10-CM | POA: Diagnosis not present

## 2019-01-13 DIAGNOSIS — R52 Pain, unspecified: Secondary | ICD-10-CM | POA: Diagnosis not present

## 2019-01-13 DIAGNOSIS — Z8619 Personal history of other infectious and parasitic diseases: Secondary | ICD-10-CM | POA: Diagnosis not present

## 2019-01-13 DIAGNOSIS — Z20828 Contact with and (suspected) exposure to other viral communicable diseases: Secondary | ICD-10-CM | POA: Diagnosis not present

## 2019-01-13 DIAGNOSIS — M898X9 Other specified disorders of bone, unspecified site: Secondary | ICD-10-CM | POA: Diagnosis not present

## 2019-01-13 DIAGNOSIS — I10 Essential (primary) hypertension: Secondary | ICD-10-CM | POA: Diagnosis not present

## 2019-01-13 DIAGNOSIS — M1990 Unspecified osteoarthritis, unspecified site: Secondary | ICD-10-CM | POA: Diagnosis not present

## 2019-01-13 DIAGNOSIS — F329 Major depressive disorder, single episode, unspecified: Secondary | ICD-10-CM | POA: Diagnosis not present

## 2019-01-13 DIAGNOSIS — R0789 Other chest pain: Secondary | ICD-10-CM | POA: Diagnosis not present

## 2019-01-13 DIAGNOSIS — Z8673 Personal history of transient ischemic attack (TIA), and cerebral infarction without residual deficits: Secondary | ICD-10-CM | POA: Diagnosis not present

## 2019-01-13 DIAGNOSIS — M064 Inflammatory polyarthropathy: Secondary | ICD-10-CM | POA: Diagnosis not present

## 2019-01-13 DIAGNOSIS — R079 Chest pain, unspecified: Secondary | ICD-10-CM | POA: Diagnosis not present

## 2019-01-13 DIAGNOSIS — Z8249 Family history of ischemic heart disease and other diseases of the circulatory system: Secondary | ICD-10-CM | POA: Diagnosis not present

## 2019-01-13 DIAGNOSIS — E7800 Pure hypercholesterolemia, unspecified: Secondary | ICD-10-CM | POA: Diagnosis not present

## 2019-01-13 DIAGNOSIS — Z79899 Other long term (current) drug therapy: Secondary | ICD-10-CM | POA: Diagnosis not present

## 2019-01-13 DIAGNOSIS — E785 Hyperlipidemia, unspecified: Secondary | ICD-10-CM | POA: Diagnosis not present

## 2019-01-13 DIAGNOSIS — T50995A Adverse effect of other drugs, medicaments and biological substances, initial encounter: Secondary | ICD-10-CM | POA: Diagnosis not present

## 2019-01-13 DIAGNOSIS — Z9842 Cataract extraction status, left eye: Secondary | ICD-10-CM | POA: Diagnosis not present

## 2019-01-13 DIAGNOSIS — R413 Other amnesia: Secondary | ICD-10-CM | POA: Diagnosis not present

## 2019-01-13 DIAGNOSIS — Z961 Presence of intraocular lens: Secondary | ICD-10-CM | POA: Diagnosis not present

## 2019-01-13 DIAGNOSIS — M898X0 Other specified disorders of bone, multiple sites: Secondary | ICD-10-CM | POA: Diagnosis not present

## 2019-01-13 DIAGNOSIS — F419 Anxiety disorder, unspecified: Secondary | ICD-10-CM | POA: Diagnosis not present

## 2019-01-13 LAB — POCT I-STAT CREATININE: Creatinine, Ser: 0.9 mg/dL (ref 0.44–1.00)

## 2019-01-13 MED ORDER — IOHEXOL 350 MG/ML SOLN
75.0000 mL | Freq: Once | INTRAVENOUS | Status: AC | PRN
  Administered 2019-01-13: 75 mL via INTRAVENOUS

## 2019-01-14 DIAGNOSIS — M810 Age-related osteoporosis without current pathological fracture: Secondary | ICD-10-CM | POA: Diagnosis not present

## 2019-01-15 ENCOUNTER — Encounter: Payer: Self-pay | Admitting: Emergency Medicine

## 2019-01-15 ENCOUNTER — Other Ambulatory Visit: Payer: Self-pay

## 2019-01-15 ENCOUNTER — Emergency Department: Payer: PPO

## 2019-01-15 ENCOUNTER — Emergency Department
Admission: EM | Admit: 2019-01-15 | Discharge: 2019-01-15 | Disposition: A | Discharge status: Home / Self Care | Payer: PPO | Source: Home / Self Care | Attending: Emergency Medicine | Admitting: Emergency Medicine

## 2019-01-15 ENCOUNTER — Inpatient Hospital Stay
Admission: EM | Admit: 2019-01-15 | Discharge: 2019-01-18 | DRG: 565 | Disposition: A | Discharge status: Home Health | Payer: PPO | Attending: Internal Medicine | Admitting: Internal Medicine

## 2019-01-15 DIAGNOSIS — Z20828 Contact with and (suspected) exposure to other viral communicable diseases: Secondary | ICD-10-CM | POA: Diagnosis present

## 2019-01-15 DIAGNOSIS — I6529 Occlusion and stenosis of unspecified carotid artery: Secondary | ICD-10-CM | POA: Diagnosis present

## 2019-01-15 DIAGNOSIS — F419 Anxiety disorder, unspecified: Secondary | ICD-10-CM | POA: Diagnosis present

## 2019-01-15 DIAGNOSIS — M549 Dorsalgia, unspecified: Secondary | ICD-10-CM

## 2019-01-15 DIAGNOSIS — Z7902 Long term (current) use of antithrombotics/antiplatelets: Secondary | ICD-10-CM

## 2019-01-15 DIAGNOSIS — Z8673 Personal history of transient ischemic attack (TIA), and cerebral infarction without residual deficits: Secondary | ICD-10-CM | POA: Insufficient documentation

## 2019-01-15 DIAGNOSIS — Z887 Allergy status to serum and vaccine status: Secondary | ICD-10-CM

## 2019-01-15 DIAGNOSIS — M810 Age-related osteoporosis without current pathological fracture: Secondary | ICD-10-CM | POA: Diagnosis present

## 2019-01-15 DIAGNOSIS — T50995A Adverse effect of other drugs, medicaments and biological substances, initial encounter: Secondary | ICD-10-CM | POA: Diagnosis present

## 2019-01-15 DIAGNOSIS — F05 Delirium due to known physiological condition: Secondary | ICD-10-CM | POA: Diagnosis not present

## 2019-01-15 DIAGNOSIS — M353 Polymyalgia rheumatica: Secondary | ICD-10-CM

## 2019-01-15 DIAGNOSIS — Z8249 Family history of ischemic heart disease and other diseases of the circulatory system: Secondary | ICD-10-CM

## 2019-01-15 DIAGNOSIS — F329 Major depressive disorder, single episode, unspecified: Secondary | ICD-10-CM | POA: Diagnosis present

## 2019-01-15 DIAGNOSIS — I10 Essential (primary) hypertension: Secondary | ICD-10-CM | POA: Insufficient documentation

## 2019-01-15 DIAGNOSIS — E559 Vitamin D deficiency, unspecified: Secondary | ICD-10-CM | POA: Diagnosis present

## 2019-01-15 DIAGNOSIS — I779 Disorder of arteries and arterioles, unspecified: Secondary | ICD-10-CM | POA: Diagnosis present

## 2019-01-15 DIAGNOSIS — E785 Hyperlipidemia, unspecified: Secondary | ICD-10-CM | POA: Diagnosis present

## 2019-01-15 DIAGNOSIS — M898X9 Other specified disorders of bone, unspecified site: Secondary | ICD-10-CM

## 2019-01-15 DIAGNOSIS — I712 Thoracic aortic aneurysm, without rupture: Secondary | ICD-10-CM | POA: Diagnosis present

## 2019-01-15 DIAGNOSIS — Z961 Presence of intraocular lens: Secondary | ICD-10-CM | POA: Diagnosis present

## 2019-01-15 DIAGNOSIS — Z79899 Other long term (current) drug therapy: Secondary | ICD-10-CM

## 2019-01-15 DIAGNOSIS — Z87442 Personal history of urinary calculi: Secondary | ICD-10-CM

## 2019-01-15 DIAGNOSIS — R52 Pain, unspecified: Secondary | ICD-10-CM | POA: Diagnosis present

## 2019-01-15 DIAGNOSIS — Z8619 Personal history of other infectious and parasitic diseases: Secondary | ICD-10-CM

## 2019-01-15 DIAGNOSIS — E7800 Pure hypercholesterolemia, unspecified: Secondary | ICD-10-CM | POA: Diagnosis present

## 2019-01-15 DIAGNOSIS — R413 Other amnesia: Secondary | ICD-10-CM | POA: Diagnosis present

## 2019-01-15 DIAGNOSIS — Z90710 Acquired absence of both cervix and uterus: Secondary | ICD-10-CM

## 2019-01-15 DIAGNOSIS — Z9842 Cataract extraction status, left eye: Secondary | ICD-10-CM

## 2019-01-15 DIAGNOSIS — M064 Inflammatory polyarthropathy: Secondary | ICD-10-CM | POA: Diagnosis present

## 2019-01-15 DIAGNOSIS — M898X0 Other specified disorders of bone, multiple sites: Principal | ICD-10-CM | POA: Diagnosis present

## 2019-01-15 LAB — COMPREHENSIVE METABOLIC PANEL
ALT: 17 U/L (ref 0–44)
AST: 26 U/L (ref 15–41)
Albumin: 3.9 g/dL (ref 3.5–5.0)
Alkaline Phosphatase: 55 U/L (ref 38–126)
Anion gap: 9 (ref 5–15)
BUN: 14 mg/dL (ref 8–23)
CO2: 21 mmol/L — ABNORMAL LOW (ref 22–32)
Calcium: 8.6 mg/dL — ABNORMAL LOW (ref 8.9–10.3)
Chloride: 107 mmol/L (ref 98–111)
Creatinine, Ser: 0.77 mg/dL (ref 0.44–1.00)
GFR calc Af Amer: >60 mL/min (ref >60)
GFR calc non Af Amer: >60 mL/min (ref >60)
Glucose, Bld: 114 mg/dL — ABNORMAL HIGH (ref 70–99)
Potassium: 4.1 mmol/L (ref 3.5–5.1)
Sodium: 137 mmol/L (ref 135–145)
Total Bilirubin: 0.9 mg/dL (ref 0.3–1.2)
Total Protein: 6.9 g/dL (ref 6.5–8.1)

## 2019-01-15 LAB — CBC
HCT: 39.2 % (ref 36.0–46.0)
HCT: 41.9 % (ref 36.0–46.0)
Hemoglobin: 13.7 g/dL (ref 12.0–15.0)
Hemoglobin: 13.6 g/dL (ref 12.0–15.0)
MCH: 30.2 pg (ref 26.0–34.0)
MCH: 29.6 pg (ref 26.0–34.0)
MCHC: 34.9 g/dL (ref 30.0–36.0)
MCHC: 32.5 g/dL (ref 30.0–36.0)
MCV: 86.3 fL (ref 80.0–100.0)
MCV: 91.1 fL (ref 80.0–100.0)
Platelets: 264 10*3/uL (ref 150–400)
Platelets: 259 10*3/uL (ref 150–400)
RBC: 4.54 MIL/uL (ref 3.87–5.11)
RBC: 4.6 MIL/uL (ref 3.87–5.11)
RDW: 15.7 % — ABNORMAL HIGH (ref 11.5–15.5)
RDW: 15.8 % — ABNORMAL HIGH (ref 11.5–15.5)
WBC: 11.7 10*3/uL — ABNORMAL HIGH (ref 4.0–10.5)
WBC: 8.8 10*3/uL (ref 4.0–10.5)
nRBC: 0 % (ref 0.0–0.2)
nRBC: 0 % (ref 0.0–0.2)

## 2019-01-15 LAB — BASIC METABOLIC PANEL
Anion gap: 10 (ref 5–15)
BUN: 21 mg/dL (ref 8–23)
CO2: 24 mmol/L (ref 22–32)
Calcium: 8.7 mg/dL — ABNORMAL LOW (ref 8.9–10.3)
Chloride: 102 mmol/L (ref 98–111)
Creatinine, Ser: 1.04 mg/dL — ABNORMAL HIGH (ref 0.44–1.00)
GFR calc Af Amer: 58 mL/min — ABNORMAL LOW (ref >60)
GFR calc non Af Amer: 50 mL/min — ABNORMAL LOW (ref >60)
Glucose, Bld: 121 mg/dL — ABNORMAL HIGH (ref 70–99)
Potassium: 3.9 mmol/L (ref 3.5–5.1)
Sodium: 136 mmol/L (ref 135–145)

## 2019-01-15 LAB — TROPONIN I (HIGH SENSITIVITY)
Troponin I (High Sensitivity): 5 ng/L (ref <18)
Troponin I (High Sensitivity): 5 ng/L (ref <18)

## 2019-01-15 MED ORDER — OXYCODONE-ACETAMINOPHEN 5-325 MG PO TABS
2.0000 | ORAL_TABLET | Freq: Once | ORAL | Status: AC
  Administered 2019-01-15: 2 via ORAL
  Filled 2019-01-15: qty 2

## 2019-01-15 MED ORDER — SODIUM CHLORIDE 0.9% FLUSH
3.0000 mL | Freq: Once | INTRAVENOUS | Status: AC
  Administered 2019-01-16: 3 mL via INTRAVENOUS

## 2019-01-15 MED ORDER — TRAMADOL HCL 50 MG PO TABS: 50.0000 mg | ORAL_TABLET | Freq: Four times a day (QID) | ORAL | 0 refills | Status: DC | PRN

## 2019-01-15 MED ORDER — ONDANSETRON 4 MG PO TBDP
4.0000 mg | ORAL_TABLET | Freq: Once | ORAL | Status: AC
  Administered 2019-01-15: 4 mg via ORAL
  Filled 2019-01-15: qty 1

## 2019-01-15 NOTE — ED Notes (Signed)
Dr. Sung at bedside.  

## 2019-01-15 NOTE — ED Notes (Signed)
Pt up to use bathroom 

## 2019-01-15 NOTE — ED Notes (Signed)
Patient transported to X-ray 

## 2019-01-15 NOTE — ED Notes (Signed)
EDP at bedside  

## 2019-01-15 NOTE — ED Provider Notes (Signed)
[facility] Emergency Department Provider Note  Time seen: 8:55 AM  I have reviewed the triage vital signs and the nursing notes.   HISTORY  Chief Complaint Back Pain and Chest Pain   HPI Beth Espinoza is a 83 y.o. female with a past medical history anxiety, arthritis, hypertension, hyperlipidemia, presents to the emergency department for diffuse pain especially in her chest and back.  According to the patient she had a Reclast infusion yesterday for the first time for her osteoporosis.  Patient states starting last night she developed pain in her back and her chest, states the pain was significant throughout the night and she was not able to sleep.  Pain is much worse with movement of the back especially bending or twisting.  Denies any nausea denies diaphoresis.  No shortness of breath fever or cough.   Past Medical History:  Diagnosis Date  . Anxiety   . Arthritis   . Cataract   . History of chicken pox   . History of kidney stones October 2013  . Hx: UTI (urinary tract infection)   . Hyperlipidemia   . Hypertension   . Rheumatoid arthritis ([place])   . Stroke [facility])    tia    Patient Active Problem List   Diagnosis Date Noted  . Memory change 10/16/2018  . Osteoporosis 10/11/2018  . Fall 08/19/2017  . Ascending aortic aneurysm ([place]) 01/06/2017  . Hx of migraines 12/12/2016  . Pseudophakia of right eye 10/07/2016  . Carotid stenosis 07/24/2015  . Ectatic thoracic aorta ([place]) 07/24/2015  . TIA (transient ischemic attack) 07/20/2015  . Dysphagia 06/10/2015  . Essential hypertension 02/11/2015  . Health care maintenance 04/09/2014  . Difficulty sleeping 01/25/2014  . Left carotid bruit 01/25/2014  . PMR (polymyalgia rheumatica) (HCC) 07/12/2013  . Epiretinal membrane (ERM) of left eye 11/29/2012  . Nuclear sclerotic cataract of both eyes 11/29/2012  . ERM OD (epiretinal membrane, right eye) 11/04/2012  . Hypercholesterolemia 06/25/2012  . History of  colonic polyps 06/25/2012  . Anxiety 06/25/2012  . Nephrolithiasis 06/25/2012    Past Surgical History:  Procedure Laterality Date  . ABDOMINAL HYSTERECTOMY    . BREAST BIOPSY Right 1995  . CATARACT EXTRACTION W/PHACO Left 04/13/2018   Procedure: CATARACT EXTRACTION PHACO AND INTRAOCULAR LENS PLACEMENT ([place]) Left Eye;  Surgeon: Birder Robson, MD;  Location: [facility] ORS;  Service: Ophthalmology;  Laterality: Left;  [place]  00:47.0 CDE 5.52 Fluid pack lot # [id number]  . EYE SURGERY    . IR ANGIO INTRA EXTRACRAN SEL COM CAROTID INNOMINATE BILAT MOD SED  07/18/2016  . IR ANGIO VERTEBRAL SEL VERTEBRAL BILAT MOD SED  07/18/2016  . PARTIAL HYSTERECTOMY  1972  . TONSILLECTOMY AND ADENOIDECTOMY  1945    Prior to Admission medications   Medication Sig Start Date End Date Taking? Authorizing Provider  ALPRAZolam Duanne Moron) 0.5 MG tablet Take 1 tablet (0.5 mg total) by mouth daily as needed for anxiety. 11/10/18   [name], MD  amLODipine (NORVASC) 5 MG tablet Take 1 tablet (5 mg total) by mouth daily. 12/08/18   [name], MD  atenolol (TENORMIN) 25 MG tablet Take 1 tablet (25 mg total) by mouth daily. Must keep appt in June for additional refills 01/05/19   [name], MD  Bioflavonoid Products (VITAMIN C) CHEW Chew 1 tablet by mouth daily.    [provider]  clopidogrel (PLAVIX) 75 MG tablet Take 1 tablet (75 mg total) by mouth daily. 10/19/18   [name], MD  Evolocumab (REPATHA SURECLICK) [id number] MG/ML SOAJ Inject 140 mg into the skin every 14 (fourteen) days. Patient not taking: Reported on 11/22/2018 10/21/18   [name], MD  folic acid (FOLVITE) Q000111Q MCG tablet Take 800 mcg by mouth daily.     [provider]  methotrexate (RHEUMATREX) 2.5 MG tablet Take 6 tablets per week. Patient taking differently: Take 10 mg by mouth once a week. Take 6 tablets per week. 04/09/14   [name], MD  PARoxetine (PAXIL) 20 MG tablet Take 1 tablet (20 mg total) by mouth daily.  10/19/18   [name], MD    Allergies  Allergen Reactions  . Pneumovax [Pneumococcal Polysaccharide Vaccine] Swelling  . Pneumococcal Vaccine Swelling    Family History  Problem Relation Age of Onset  . Asthma Mother   . Diabetes Mother   . Cataracts Mother   . Heart disease Father   . Diabetes Father   . Hypertension Father   . Cancer Paternal Aunt        Liver  . Diabetes Paternal Grandmother   . Diabetes Sister   . Retinal degeneration Sister   . Glaucoma Sister     Social History Social History   Tobacco Use  . Smoking status: Never Smoker  . Smokeless tobacco: Never Used  Substance Use Topics  . Alcohol use: No    Alcohol/week: 0.0 standard drinks  . Drug use: No    Review of Systems Constitutional: Negative for fever Cardiovascular: Chest pain Respiratory: Negative for shortness of breath. Gastrointestinal: Negative for abdominal pain Musculoskeletal: Back pain Skin: Negative for skin complaints  Neurological: Negative for headache All other ROS negative  ____________________________________________   PHYSICAL EXAM:  VITAL SIGNS: ED Triage Vitals [01/15/19 0850]  Enc Vitals Group     BP      Pulse      Resp      Temp      Temp src      SpO2      Weight 140 lb (63.5 kg)     Height 5\' 7"  (1.702 m)     Head Circumference      Peak Flow      Pain Score 10     Pain Loc      Pain Edu?      Excl. in [place]?     Constitutional: Alert and oriented. Well appearing and in no distress. Eyes: Normal exam ENT      Head: Normocephalic and atraumatic      Mouth/Throat: Mucous membranes are moist. Cardiovascular: Normal rate, regular rhythm.  Respiratory: Normal respiratory effort without tachypnea nor retractions. Breath sounds are clear.  Moderate chest wall tenderness to palpation. Gastrointestinal: Soft and nontender. No distention.  Musculoskeletal: Moderate T and L-spine tenderness palpation. Neurologic:  Normal speech and language. No gross  focal neurologic deficits  Skin:  Skin is warm, dry and intact.  Psychiatric: Mood and affect are normal.   ____________________________________________    EKG  EKG viewed and interpreted by myself shows a normal sinus rhythm at 71 bpm with a narrow QRS, normal axis, normal intervals, nonspecific ST changes  ____________________________________________    RADIOLOGY  Chest x-ray negative  ____________________________________________   INITIAL IMPRESSION / ASSESSMENT AND PLAN / ED COURSE  Pertinent labs & imaging results that were available during my care of the patient were reviewed by me and considered in my medical decision making (see chart for details).   Patient presents to the emergency department for chest and  back pain.  Pain is very positional/reproducible especially with movement or palpation of the chest or back.  I reviewed the adverse reactions for Reclast, per up-to-date 55% of patients experience ostealgia following infusion.  Given the reproducibility of the pain and pain worse with movement I highly suspect this to be the case.  However as a precaution we will check labs including cardiac enzymes obtain a chest x-ray and continue to closely monitor.  We will treat pain with Percocet in the emergency department.  Patient is feeling much better.  Able to move on her own after pain medication.  Patient does have a history of an ascending aorta had a CTA performed 48 hours ago that was essentially normal.  I discussed the possibility of repeating however the patient and daughter both feel that the reclass is to blame and she feels much better after pain medication.  I strongly suspect the request is to blame for her pain as well.  We will discharge with a short course of tramadol.  We will have the patient follow-up with her doctor.  I did discuss very strict return precautions.  Beth Espinoza was evaluated in Emergency Department on 01/15/2019 for the symptoms described in the  history of present illness. She was evaluated in the context of the global COVID-19 pandemic, which necessitated consideration that the patient might be at risk for infection with the SARS-CoV-2 virus that causes COVID-19. Institutional protocols and algorithms that pertain to the evaluation of patients at risk for COVID-19 are in a state of rapid change based on information released by regulatory bodies including the CDC and federal and state organizations. These policies and algorithms were followed during the patient's care in the ED.  ____________________________________________   FINAL CLINICAL IMPRESSION(S) / ED DIAGNOSES  Musculoskeletal pain Chest pain   Harvest Dark, MD 01/15/19 1142

## 2019-01-15 NOTE — Discharge Instructions (Signed)
As we discussed please return to the emergency department for any return of significant pain, any shortness of breath, or any symptom personally concerning to yourself.

## 2019-01-15 NOTE — ED Notes (Signed)
Son at bedside.

## 2019-01-15 NOTE — ED Provider Notes (Signed)
[facility] Emergency Department Provider Note   ____________________________________________   First MD Initiated Contact with Patient 01/15/19 2308     (approximate)  I have reviewed the triage vital signs and the nursing notes.   HISTORY  Chief Complaint Chest Pain    HPI Beth Espinoza is a 83 y.o. female who returns to the ED within 16 hours for persistent pain.  Patient took a Reclast infusion yesterday for the first time for her osteoporosis.  She was unable to sleep overnight secondary to pain in her back, chest and shoulders.  She was seen in the ED yesterday morning, felt better after a Percocet and discharged home on tramadol.  Patient took several tramadol without relief of symptoms and got to the point where she was unable to move secondary to the pain and required 2 family members to assist her to the restroom.  Pain is significantly worse with movement of her back and shoulders.  History of thoracic aneurysm and had a recent CT which demonstrates stable aneurysm.  Denies fever, cough, shortness of breath, abdominal pain, nausea, vomiting, diarrhea.       Past Medical History:  Diagnosis Date  . Anxiety   . Arthritis   . Cataract   . History of chicken pox   . History of kidney stones October 2013  . Hx: UTI (urinary tract infection)   . Hyperlipidemia   . Hypertension   . Rheumatoid arthritis ([place])   . Stroke [facility])    tia    Patient Active Problem List   Diagnosis Date Noted  . Memory change 10/16/2018  . Osteoporosis 10/11/2018  . Fall 08/19/2017  . Ascending aortic aneurysm ([place]) 01/06/2017  . Hx of migraines 12/12/2016  . Pseudophakia of right eye 10/07/2016  . Carotid stenosis 07/24/2015  . Ectatic thoracic aorta ([place]) 07/24/2015  . TIA (transient ischemic attack) 07/20/2015  . Dysphagia 06/10/2015  . Essential hypertension 02/11/2015  . Health care maintenance 04/09/2014  . Difficulty sleeping 01/25/2014  . Left carotid bruit  01/25/2014  . PMR (polymyalgia rheumatica) (HCC) 07/12/2013  . Epiretinal membrane (ERM) of left eye 11/29/2012  . Nuclear sclerotic cataract of both eyes 11/29/2012  . ERM OD (epiretinal membrane, right eye) 11/04/2012  . Hypercholesterolemia 06/25/2012  . History of colonic polyps 06/25/2012  . Anxiety 06/25/2012  . Nephrolithiasis 06/25/2012    Past Surgical History:  Procedure Laterality Date  . ABDOMINAL HYSTERECTOMY    . BREAST BIOPSY Right 1995  . CATARACT EXTRACTION W/PHACO Left 04/13/2018   Procedure: CATARACT EXTRACTION PHACO AND INTRAOCULAR LENS PLACEMENT ([place]) Left Eye;  Surgeon: Birder Robson, MD;  Location: [facility] ORS;  Service: Ophthalmology;  Laterality: Left;  [place]  00:47.0 CDE 5.52 Fluid pack lot # [id number]  . EYE SURGERY    . IR ANGIO INTRA EXTRACRAN SEL COM CAROTID INNOMINATE BILAT MOD SED  07/18/2016  . IR ANGIO VERTEBRAL SEL VERTEBRAL BILAT MOD SED  07/18/2016  . PARTIAL HYSTERECTOMY  1972  . TONSILLECTOMY AND ADENOIDECTOMY  1945    Prior to Admission medications   Medication Sig Start Date End Date Taking? Authorizing Provider  ALPRAZolam Duanne Moron) 0.5 MG tablet Take 1 tablet (0.5 mg total) by mouth daily as needed for anxiety. 11/10/18   [name], MD  amLODipine (NORVASC) 5 MG tablet Take 1 tablet (5 mg total) by mouth daily. 12/08/18   [name], MD  atenolol (TENORMIN) 25 MG tablet Take 1 tablet (25 mg total) by mouth daily. Must keep appt  in June for additional refills 01/05/19   [name], MD  Bioflavonoid Products (VITAMIN C) CHEW Chew 1 tablet by mouth daily.    [provider]  clopidogrel (PLAVIX) 75 MG tablet Take 1 tablet (75 mg total) by mouth daily. 10/19/18   [name], MD  Evolocumab (REPATHA SURECLICK) [id number] MG/ML SOAJ Inject 140 mg into the skin every 14 (fourteen) days. Patient not taking: Reported on 11/22/2018 10/21/18   [name], MD  folic acid (FOLVITE) Q000111Q MCG tablet Take 800 mcg by mouth daily.     [provider]  methotrexate (RHEUMATREX) 2.5 MG tablet Take 6 tablets per week. Patient taking differently: Take 10 mg by mouth once a week. Take 6 tablets per week. 04/09/14   [name], MD  PARoxetine (PAXIL) 20 MG tablet Take 1 tablet (20 mg total) by mouth daily. 10/19/18   [name], MD  traMADol (ULTRAM) 50 MG tablet Take 1 tablet (50 mg total) by mouth every 6 (six) hours as needed. 01/15/19   Harvest Dark, MD    Allergies Pneumovax [pneumococcal polysaccharide vaccine] and Pneumococcal vaccine  Family History  Problem Relation Age of Onset  . Asthma Mother   . Diabetes Mother   . Cataracts Mother   . Heart disease Father   . Diabetes Father   . Hypertension Father   . Cancer Paternal Aunt        Liver  . Diabetes Paternal Grandmother   . Diabetes Sister   . Retinal degeneration Sister   . Glaucoma Sister     Social History Social History   Tobacco Use  . Smoking status: Never Smoker  . Smokeless tobacco: Never Used  Substance Use Topics  . Alcohol use: No    Alcohol/week: 0.0 standard drinks  . Drug use: No    Review of Systems  Constitutional: No fever/chills Eyes: No visual changes. ENT: No sore throat. Cardiovascular: Positive for chest pain. Respiratory: Denies shortness of breath. Gastrointestinal: No abdominal pain.  No nausea, no vomiting.  No diarrhea.  No constipation. Genitourinary: Negative for dysuria. Musculoskeletal: Positive for back pain. Skin: Negative for rash. Neurological: Negative for headaches, focal weakness or numbness.   ____________________________________________   PHYSICAL EXAM:  VITAL SIGNS: ED Triage Vitals  Enc Vitals Group     BP 01/15/19 2212 (!) 145/110     Pulse Rate 01/15/19 2210 75     Resp 01/15/19 2210 (!) 22     Temp 01/15/19 2210 99.4 F (37.4 C)     Temp Source 01/15/19 2210 Oral     SpO2 01/15/19 2212 97 %     Weight 01/15/19 2210 140 lb (63.5 kg)     Height 01/15/19 2210 5\' 5"   (1.651 m)     Head Circumference --      Peak Flow --      Pain Score 01/15/19 2210 10     Pain Loc --      Pain Edu? --      Excl. in [place]? --     Constitutional: Alert and oriented.  Uncomfortable appearing and in mild acute distress. Eyes: Conjunctivae are normal. PERRL. EOMI. Head: Atraumatic. Nose: No congestion/rhinnorhea. Mouth/Throat: Mucous membranes are moist.  Oropharynx non-erythematous. Neck: No stridor.  No cervical spine tenderness to palpation. Cardiovascular: Normal rate, regular rhythm. Grossly normal heart sounds.  Good peripheral circulation. Respiratory: Normal respiratory effort.  No retractions. Lungs CTAB. Gastrointestinal: Soft and nontender. No distention. No abdominal bruits. No CVA tenderness. Musculoskeletal: No  spinal tenderness to palpation.  Thoracic back and shoulders tender to palpation and especially with any minimal movement.  No lower extremity tenderness nor edema.  No joint effusions. Neurologic:  Normal speech and language. No gross focal neurologic deficits are appreciated. No gait instability. Skin:  Skin is warm, dry and intact. No rash noted. Psychiatric: Mood and affect are normal. Speech and behavior are normal.  ____________________________________________   LABS (all labs ordered are listed, but only abnormal results are displayed)  Labs Reviewed  BASIC METABOLIC PANEL - Abnormal; Notable for the following components:      Result Value   Glucose, Bld 121 (*)    Creatinine, Ser 1.04 (*)    Calcium 8.7 (*)    GFR calc non Af Amer 50 (*)    GFR calc Af Amer 58 (*)    All other components within normal limits  CBC - Abnormal; Notable for the following components:   RDW 15.8 (*)    All other components within normal limits  SARS CORONAVIRUS 2 (TAT 6-24 HRS)  TROPONIN I (HIGH SENSITIVITY)  TROPONIN I (HIGH SENSITIVITY)   ____________________________________________  EKG  ED ECG REPORT I, SUNG,JADE J, the attending physician,  personally viewed and interpreted this ECG.   Date: 01/16/2019  EKG Time: 2213  Rate: 77  Rhythm: normal EKG, normal sinus rhythm  Axis: Normal  Intervals:none  ST&T Change: Nonspecific  ____________________________________________  RADIOLOGY  ED MD interpretation: No acute cardiopulmonary process  Official radiology report(s): Dg Chest 2 View  Result Date: 01/15/2019 CLINICAL DATA:  Chest pain EXAM: CHEST - 2 VIEW COMPARISON:  01/15/2019, CT 01/13/2019 FINDINGS: No focal opacity or pleural effusion. Stable cardiomediastinal silhouette with aortic atherosclerosis. No pneumothorax. IMPRESSION: No active cardiopulmonary disease. Electronically Signed   By: Donavan Foil M.D.   On: 01/15/2019 22:46   Dg Chest Portable 1 View  Result Date: 01/15/2019 CLINICAL DATA:  Chest pain. EXAM: PORTABLE CHEST 1 VIEW COMPARISON:  01/13/2019 FINDINGS: The heart size and mediastinal contours are within normal limits. The lungs are hyperinflated but clear. The visualized skeletal structures are unremarkable. IMPRESSION: No active cardiopulmonary abnormalities. Electronically Signed   By: Kerby Moors M.D.   On: 01/15/2019 09:31    ____________________________________________   PROCEDURES  Procedure(s) performed (including Critical Care):  Procedures   ____________________________________________   INITIAL IMPRESSION / ASSESSMENT AND PLAN / ED COURSE  As part of my medical decision making, I reviewed the following data within the [place] History obtained from family, Nursing notes reviewed and incorporated, Labs reviewed, EKG interpreted, Old chart reviewed, Radiograph reviewed, Discussed with admitting physician and Notes from prior ED visits     Jamariona Boris was evaluated in Emergency Department on 01/16/2019 for the symptoms described in the history of present illness. She was evaluated in the context of the global COVID-19 pandemic, which necessitated consideration that  the patient might be at risk for infection with the SARS-CoV-2 virus that causes COVID-19. Institutional protocols and algorithms that pertain to the evaluation of patients at risk for COVID-19 are in a state of rapid change based on information released by regulatory bodies including the CDC and federal and state organizations. These policies and algorithms were followed during the patient's care in the ED.    83 year old female who returns to the ED for persistent chest, upper back and shoulders pain since receiving IV Reclast infusion. Differential diagnosis includes, but is not limited to, ACS, aortic dissection, pulmonary embolism, cardiac tamponade, pneumothorax, pneumonia, pericarditis,  myocarditis, GI-related causes including esophagitis/gastritis, and musculoskeletal chest wall pain.    Repeat troponin remains unremarkable.  Patient had routine follow-up CT chest just 3 days ago for her thoracic aneurysm which remained stable.  Feel her symptoms most likely ostelgia secondary to Reclast infusion.  Will administer IV morphine and discuss with hospitalist to evaluate patient in the emergency department for admission for intractable pain.      ____________________________________________   FINAL CLINICAL IMPRESSION(S) / ED DIAGNOSES  Final diagnoses:  Ostealgia  Intractable pain     ED Discharge Orders    None       Note:  This document was prepared using Dragon voice recognition software and may include unintentional dictation errors.   Paulette Blanch, MD 01/16/19 [phone]

## 2019-01-15 NOTE — ED Triage Notes (Signed)
Patient brought in by ems from home. Patient with complaint of right sided chest pain that radiates to her back. Patient states that the pain started this am. Patient states that she was seen here this morning for the same and was discharged.

## 2019-01-15 NOTE — ED Triage Notes (Signed)
Pt arrives via EMS from home with back and chest pain- pt had an IV infusion of reclast yesterday for osteoporosis- around 10 PM pt had 10/10 chest pain that radiates to the back and down the spine- pt now c/o of back and leg pain

## 2019-01-16 ENCOUNTER — Other Ambulatory Visit: Payer: Self-pay

## 2019-01-16 DIAGNOSIS — Z8673 Personal history of transient ischemic attack (TIA), and cerebral infarction without residual deficits: Secondary | ICD-10-CM | POA: Diagnosis not present

## 2019-01-16 DIAGNOSIS — Z8249 Family history of ischemic heart disease and other diseases of the circulatory system: Secondary | ICD-10-CM | POA: Diagnosis not present

## 2019-01-16 DIAGNOSIS — R413 Other amnesia: Secondary | ICD-10-CM | POA: Diagnosis present

## 2019-01-16 DIAGNOSIS — Z9842 Cataract extraction status, left eye: Secondary | ICD-10-CM | POA: Diagnosis not present

## 2019-01-16 DIAGNOSIS — R52 Pain, unspecified: Secondary | ICD-10-CM | POA: Diagnosis not present

## 2019-01-16 DIAGNOSIS — E559 Vitamin D deficiency, unspecified: Secondary | ICD-10-CM | POA: Diagnosis present

## 2019-01-16 DIAGNOSIS — Z961 Presence of intraocular lens: Secondary | ICD-10-CM | POA: Diagnosis present

## 2019-01-16 DIAGNOSIS — M898X9 Other specified disorders of bone, unspecified site: Secondary | ICD-10-CM | POA: Diagnosis present

## 2019-01-16 DIAGNOSIS — F329 Major depressive disorder, single episode, unspecified: Secondary | ICD-10-CM | POA: Diagnosis present

## 2019-01-16 DIAGNOSIS — I712 Thoracic aortic aneurysm, without rupture: Secondary | ICD-10-CM | POA: Diagnosis present

## 2019-01-16 DIAGNOSIS — Z7902 Long term (current) use of antithrombotics/antiplatelets: Secondary | ICD-10-CM | POA: Diagnosis not present

## 2019-01-16 DIAGNOSIS — I6529 Occlusion and stenosis of unspecified carotid artery: Secondary | ICD-10-CM | POA: Diagnosis present

## 2019-01-16 DIAGNOSIS — M353 Polymyalgia rheumatica: Secondary | ICD-10-CM | POA: Diagnosis present

## 2019-01-16 DIAGNOSIS — I10 Essential (primary) hypertension: Secondary | ICD-10-CM | POA: Diagnosis present

## 2019-01-16 DIAGNOSIS — Z87442 Personal history of urinary calculi: Secondary | ICD-10-CM | POA: Diagnosis not present

## 2019-01-16 DIAGNOSIS — M810 Age-related osteoporosis without current pathological fracture: Secondary | ICD-10-CM | POA: Diagnosis present

## 2019-01-16 DIAGNOSIS — F05 Delirium due to known physiological condition: Secondary | ICD-10-CM | POA: Diagnosis not present

## 2019-01-16 DIAGNOSIS — Z90710 Acquired absence of both cervix and uterus: Secondary | ICD-10-CM | POA: Diagnosis not present

## 2019-01-16 DIAGNOSIS — M064 Inflammatory polyarthropathy: Secondary | ICD-10-CM | POA: Diagnosis present

## 2019-01-16 DIAGNOSIS — Z887 Allergy status to serum and vaccine status: Secondary | ICD-10-CM | POA: Diagnosis not present

## 2019-01-16 DIAGNOSIS — F419 Anxiety disorder, unspecified: Secondary | ICD-10-CM | POA: Diagnosis present

## 2019-01-16 DIAGNOSIS — M898X0 Other specified disorders of bone, multiple sites: Secondary | ICD-10-CM | POA: Diagnosis present

## 2019-01-16 DIAGNOSIS — Z8619 Personal history of other infectious and parasitic diseases: Secondary | ICD-10-CM | POA: Diagnosis not present

## 2019-01-16 DIAGNOSIS — I6523 Occlusion and stenosis of bilateral carotid arteries: Secondary | ICD-10-CM | POA: Diagnosis not present

## 2019-01-16 DIAGNOSIS — E785 Hyperlipidemia, unspecified: Secondary | ICD-10-CM | POA: Diagnosis present

## 2019-01-16 DIAGNOSIS — Z20828 Contact with and (suspected) exposure to other viral communicable diseases: Secondary | ICD-10-CM | POA: Diagnosis present

## 2019-01-16 DIAGNOSIS — T50995A Adverse effect of other drugs, medicaments and biological substances, initial encounter: Secondary | ICD-10-CM | POA: Diagnosis present

## 2019-01-16 LAB — BASIC METABOLIC PANEL
Anion gap: 6 (ref 5–15)
BUN: 17 mg/dL (ref 8–23)
CO2: 25 mmol/L (ref 22–32)
Calcium: 8 mg/dL — ABNORMAL LOW (ref 8.9–10.3)
Chloride: 106 mmol/L (ref 98–111)
Creatinine, Ser: 0.99 mg/dL (ref 0.44–1.00)
GFR calc Af Amer: >60 mL/min (ref >60)
GFR calc non Af Amer: 53 mL/min — ABNORMAL LOW (ref >60)
Glucose, Bld: 106 mg/dL — ABNORMAL HIGH (ref 70–99)
Potassium: 3.9 mmol/L (ref 3.5–5.1)
Sodium: 137 mmol/L (ref 135–145)

## 2019-01-16 LAB — CBC
HCT: 38.5 % (ref 36.0–46.0)
Hemoglobin: 12.6 g/dL (ref 12.0–15.0)
MCH: 29.9 pg (ref 26.0–34.0)
MCHC: 32.7 g/dL (ref 30.0–36.0)
MCV: 91.2 fL (ref 80.0–100.0)
Platelets: 230 10*3/uL (ref 150–400)
RBC: 4.22 MIL/uL (ref 3.87–5.11)
RDW: 15.8 % — ABNORMAL HIGH (ref 11.5–15.5)
WBC: 8.2 10*3/uL (ref 4.0–10.5)
nRBC: 0 % (ref 0.0–0.2)

## 2019-01-16 LAB — SEDIMENTATION RATE: Sed Rate: 35 mm/hr — ABNORMAL HIGH (ref 0–30)

## 2019-01-16 LAB — SARS CORONAVIRUS 2 (TAT 6-24 HRS): SARS Coronavirus 2: NEGATIVE

## 2019-01-16 LAB — C-REACTIVE PROTEIN: CRP: 4.4 mg/dL — ABNORMAL HIGH (ref <1.0)

## 2019-01-16 LAB — TSH: TSH: 3.653 u[IU]/mL (ref 0.350–4.500)

## 2019-01-16 LAB — VITAMIN D 25 HYDROXY (VIT D DEFICIENCY, FRACTURES): Vit D, 25-Hydroxy: 20.46 ng/mL — ABNORMAL LOW (ref 30–100)

## 2019-01-16 LAB — CK: Total CK: 41 U/L (ref 38–234)

## 2019-01-16 LAB — MAGNESIUM: Magnesium: 2.2 mg/dL (ref 1.7–2.4)

## 2019-01-16 LAB — PHOSPHORUS: Phosphorus: 3.3 mg/dL (ref 2.5–4.6)

## 2019-01-16 LAB — TROPONIN I (HIGH SENSITIVITY): Troponin I (High Sensitivity): 5 ng/L (ref <18)

## 2019-01-16 MED ORDER — CLOPIDOGREL BISULFATE 75 MG PO TABS
75.0000 mg | ORAL_TABLET | Freq: Every day | ORAL | Status: DC
  Administered 2019-01-16 – 2019-01-18 (×3): 75 mg via ORAL
  Filled 2019-01-16 (×3): qty 1

## 2019-01-16 MED ORDER — MORPHINE SULFATE (PF) 2 MG/ML IV SOLN
2.0000 mg | Freq: Once | INTRAVENOUS | Status: AC
  Administered 2019-01-16: 2 mg via INTRAVENOUS
  Filled 2019-01-16: qty 1

## 2019-01-16 MED ORDER — CYCLOBENZAPRINE HCL 10 MG PO TABS
5.0000 mg | ORAL_TABLET | Freq: Every day | ORAL | Status: DC
  Administered 2019-01-16: 5 mg via ORAL
  Filled 2019-01-16: qty 1

## 2019-01-16 MED ORDER — MORPHINE SULFATE (PF) 2 MG/ML IV SOLN: 1.0000 mg | INTRAVENOUS | Status: DC | PRN

## 2019-01-16 MED ORDER — ACETAMINOPHEN 650 MG RE SUPP: 650.0000 mg | Freq: Four times a day (QID) | RECTAL | Status: DC | PRN

## 2019-01-16 MED ORDER — ACETAMINOPHEN 325 MG PO TABS
650.0000 mg | ORAL_TABLET | Freq: Four times a day (QID) | ORAL | Status: DC | PRN
  Administered 2019-01-16 – 2019-01-18 (×2): 650 mg via ORAL
  Filled 2019-01-16 (×2): qty 2

## 2019-01-16 MED ORDER — ATENOLOL 25 MG PO TABS
25.0000 mg | ORAL_TABLET | Freq: Every day | ORAL | Status: DC
  Administered 2019-01-16 – 2019-01-18 (×3): 25 mg via ORAL
  Filled 2019-01-16 (×4): qty 1

## 2019-01-16 MED ORDER — AMLODIPINE BESYLATE 5 MG PO TABS
5.0000 mg | ORAL_TABLET | Freq: Every day | ORAL | Status: DC
  Administered 2019-01-16 – 2019-01-18 (×3): 5 mg via ORAL
  Filled 2019-01-16 (×3): qty 1

## 2019-01-16 MED ORDER — HEPARIN SODIUM (PORCINE) 5000 UNIT/ML IJ SOLN
5000.0000 [IU] | Freq: Three times a day (TID) | INTRAMUSCULAR | Status: DC
  Administered 2019-01-16 – 2019-01-18 (×7): 5000 [IU] via SUBCUTANEOUS
  Filled 2019-01-16 (×10): qty 1

## 2019-01-16 MED ORDER — FOLIC ACID 1 MG PO TABS
2.0000 mg | ORAL_TABLET | Freq: Every day | ORAL | Status: DC
  Administered 2019-01-16 – 2019-01-18 (×3): 2 mg via ORAL
  Filled 2019-01-16 (×3): qty 2

## 2019-01-16 MED ORDER — SENNOSIDES-DOCUSATE SODIUM 8.6-50 MG PO TABS
1.0000 | ORAL_TABLET | Freq: Every evening | ORAL | Status: DC | PRN
  Filled 2019-01-16: qty 1

## 2019-01-16 MED ORDER — OXYCODONE HCL 5 MG PO TABS
5.0000 mg | ORAL_TABLET | ORAL | Status: DC | PRN
  Administered 2019-01-16: 5 mg via ORAL
  Filled 2019-01-16: qty 1

## 2019-01-16 MED ORDER — PAROXETINE HCL 20 MG PO TABS
20.0000 mg | ORAL_TABLET | Freq: Every day | ORAL | Status: DC
  Administered 2019-01-16 – 2019-01-18 (×3): 20 mg via ORAL
  Filled 2019-01-16 (×3): qty 1

## 2019-01-16 MED ORDER — ALPRAZOLAM 0.5 MG PO TABS
0.5000 mg | ORAL_TABLET | Freq: Every day | ORAL | Status: DC | PRN
  Administered 2019-01-17: 0.5 mg via ORAL
  Filled 2019-01-16: qty 1

## 2019-01-16 NOTE — H&P (Addendum)
History and Physical    Beth Espinoza HYQ:[id number] DOB: Jun 08, 1935 DOA: 01/15/2019  PCP: [name], MD  Patient coming from: Home  I have personally briefly reviewed patient's old medical records in [place]  Chief Complaint: Chest, upper back, shoulders, and hip pain  HPI: Beth Espinoza is a 83 y.o. female with medical history significant for TIA, hypertension, hyperlipidemia, anxiety, inflammatory arthritis/polymyalgia rheumatica, osteoporosis, carotid artery stenosis, and thoracic aortic aneurysm who presents to the ED for evaluation of chest and upper back pain.  Patient has a history of osteoporosis and underwent infusion of first dose of zoledronic acid (Reclast) on 01/14/2019.  Patient states that since then she has been having generalized fatigue with pain across her upper chest, upper back, shoulders, and hips.  She has been having persistent pain which she says is significant and limiting her ability to sleep, walk, or complete any ADLs.  She has not had any jaw pain, headache, fevers, chills, diaphoresis, nausea, vomiting, shortness of breath, dysuria, abdominal pain, or diarrhea.   She initially came to the ED morning of 01/15/2019 and had improvement of pain with Percocet and was discharged home with prescription for tramadol.  EKG was without acute changes and troponin was 5.  Portable chest x-ray was without acute cardiopulmonary abnormalities.  Patient had continued pain and was doubling up on her tramadol and represented to the ED for further evaluation and management.  ED Course:  Initial vitals showed BP 145/110, pulse 73, RR 22, temp 99.4 Fahrenheit, SPO2 97% on room air.  Labs notable for sodium 136, potassium 3.9, bicarb 24, BUN 21, creatinine 1.04, WBC 8.8, hemoglobin 13.6, platelets 259,000, high-sensitivity troponin I 5x2.  SARS-CoV-2 test was obtained and pending.  2 view chest x-ray was negative for focal consolidation, pleural effusion, or edema.  Patient  was given IV morphine 2 mg x 2 with continued significant pain.  The hospitalist service was consulted admit for further evaluation and management.  Review of Systems: All systems reviewed and are negative except as documented in history of present illness above.   Past Medical History:  Diagnosis Date  . Anxiety   . Arthritis   . Cataract   . History of chicken pox   . History of kidney stones October 2013  . Hx: UTI (urinary tract infection)   . Hyperlipidemia   . Hypertension   . Rheumatoid arthritis ([place])   . Stroke [facility])    tia    Past Surgical History:  Procedure Laterality Date  . ABDOMINAL HYSTERECTOMY    . BREAST BIOPSY Right 1995  . CATARACT EXTRACTION W/PHACO Left 04/13/2018   Procedure: CATARACT EXTRACTION PHACO AND INTRAOCULAR LENS PLACEMENT ([place]) Left Eye;  Surgeon: Birder Robson, MD;  Location: [facility] ORS;  Service: Ophthalmology;  Laterality: Left;  [place]  00:47.0 CDE 5.52 Fluid pack lot # [id number]  . EYE SURGERY    . IR ANGIO INTRA EXTRACRAN SEL COM CAROTID INNOMINATE BILAT MOD SED  07/18/2016  . IR ANGIO VERTEBRAL SEL VERTEBRAL BILAT MOD SED  07/18/2016  . PARTIAL HYSTERECTOMY  1972  . TONSILLECTOMY AND ADENOIDECTOMY  1945    Social History:  reports that she has never smoked. She has never used smokeless tobacco. She reports that she does not drink alcohol or use drugs.  Allergies  Allergen Reactions  . Pneumovax [Pneumococcal Polysaccharide Vaccine] Swelling  . Pneumococcal Vaccine Swelling    Family History  Problem Relation Age of Onset  . Asthma Mother   . Diabetes Mother   .  Cataracts Mother   . Heart disease Father   . Diabetes Father   . Hypertension Father   . Cancer Paternal Aunt        Liver  . Diabetes Paternal Grandmother   . Diabetes Sister   . Retinal degeneration Sister   . Glaucoma Sister      Prior to Admission medications   Medication Sig Start Date End Date Taking? Authorizing Provider  ALPRAZolam Duanne Moron) 0.5 MG tablet  Take 1 tablet (0.5 mg total) by mouth daily as needed for anxiety. 11/10/18   [name], MD  amLODipine (NORVASC) 5 MG tablet Take 1 tablet (5 mg total) by mouth daily. 12/08/18   [name], MD  atenolol (TENORMIN) 25 MG tablet Take 1 tablet (25 mg total) by mouth daily. Must keep appt in June for additional refills 01/05/19   [name], MD  Bioflavonoid Products (VITAMIN C) CHEW Chew 1 tablet by mouth daily.    [provider]  clopidogrel (PLAVIX) 75 MG tablet Take 1 tablet (75 mg total) by mouth daily. 10/19/18   [name], MD  Evolocumab (REPATHA SURECLICK) 161 MG/ML SOAJ Inject 140 mg into the skin every 14 (fourteen) days. Patient not taking: Reported on 11/22/2018 10/21/18   [name], MD  folic acid (FOLVITE) 096 MCG tablet Take 800 mcg by mouth daily.     [provider]  methotrexate (RHEUMATREX) 2.5 MG tablet Take 6 tablets per week. Patient taking differently: Take 10 mg by mouth once a week. Take 6 tablets per week. 04/09/14   [name], MD  PARoxetine (PAXIL) 20 MG tablet Take 1 tablet (20 mg total) by mouth daily. 10/19/18   [name], MD  traMADol (ULTRAM) 50 MG tablet Take 1 tablet (50 mg total) by mouth every 6 (six) hours as needed. 01/15/19   Harvest Dark, MD    Physical Exam: Vitals:   01/15/19 2210 01/15/19 2212  BP:  (!) 145/110  Pulse: 75   Resp: (!) 22   Temp: 99.4 F (37.4 C)   TempSrc: Oral   SpO2:  97%  Weight: 63.5 kg   Height: _0  (1.651 m)     Constitutional: Elderly woman resting supine in bed, NAD, calm, comfortable Eyes: PERRL, lids and conjunctivae normal ENMT: Mucous membranes are moist. Posterior pharynx clear of any exudate or lesions.Normal dentition.  Neck: normal, supple, no masses. Respiratory: clear to auscultation bilaterally, no wheezing, no crackles. Normal respiratory effort. No accessory muscle use.  Cardiovascular: Regular rate and rhythm, no murmurs / rubs / gallops. No  extremity edema. 2+ pedal pulses. Abdomen: no tenderness, no masses palpated. No hepatosplenomegaly. Bowel sounds positive.  Musculoskeletal: no clubbing / cyanosis. No joint deformity upper and lower extremities.  Range of motion intact however pain elicited with movement against resistance at the shoulders and hips.  Has reproducible chest tenderness to palpation. Skin: no rashes, lesions, ulcers. No induration Neurologic: CN 2-12 grossly intact. Sensation intact, pain with movement against resistance at the shoulders and hips however strength seems intact. Psychiatric: Normal judgment and insight. Alert and oriented x 3. Normal mood.   Labs on Admission: I have personally reviewed following labs and imaging studies  CBC: Recent Labs  Lab 01/15/19 0856 01/15/19 2214  WBC 11.7* 8.8  HGB 13.7 13.6  HCT 39.2 41.9  MCV 86.3 91.1  PLT 264 045   Basic Metabolic Panel: Recent Labs  Lab 01/13/19 1609 01/15/19 0856 01/15/19 2214  NA  --  137 136  K  --  4.1 3.9  CL  --  107 102  CO2  --  21* 24  GLUCOSE  --  114* 121*  BUN  --  14 21  CREATININE 0.90 0.77 1.04*  CALCIUM  --  8.6* 8.7*   GFR: Estimated Creatinine Clearance: 38.2 mL/min (A) (by C-G formula based on SCr of 1.04 mg/dL (H)). Liver Function Tests: Recent Labs  Lab 01/15/19 0856  AST 26  ALT 17  ALKPHOS 55  BILITOT 0.9  PROT 6.9  ALBUMIN 3.9   No results for input(s): LIPASE, AMYLASE in the last 168 hours. No results for input(s): AMMONIA in the last 168 hours. Coagulation Profile: No results for input(s): INR, PROTIME in the last 168 hours. Cardiac Enzymes: No results for input(s): CKTOTAL, CKMB, CKMBINDEX, TROPONINI in the last 168 hours. BNP (last 3 results) No results for input(s): PROBNP in the last 8760 hours. HbA1C: No results for input(s): HGBA1C in the last 72 hours. CBG: No results for input(s): GLUCAP in the last 168 hours. Lipid Profile: No results for input(s): CHOL, HDL, LDLCALC, TRIG,  CHOLHDL, LDLDIRECT in the last 72 hours. Thyroid Function Tests: No results for input(s): TSH, T4TOTAL, FREET4, T3FREE, THYROIDAB in the last 72 hours. Anemia Panel: No results for input(s): VITAMINB12, FOLATE, FERRITIN, TIBC, IRON, RETICCTPCT in the last 72 hours. Urine analysis:    Component Value Date/Time   COLORURINE YELLOW 10/11/2018 1249   APPEARANCEUR CLEAR 10/11/2018 1249   APPEARANCEUR Clear 03/27/2011 1601   LABSPEC 1.020 10/11/2018 1249   LABSPEC 1.004 03/27/2011 1601   PHURINE 7.0 10/11/2018 1249   GLUCOSEU NEGATIVE 10/11/2018 1249   HGBUR NEGATIVE 10/11/2018 1249   BILIRUBINUR NEGATIVE 10/11/2018 1249   BILIRUBINUR Negative 03/27/2011 1601   KETONESUR NEGATIVE 10/11/2018 1249   PROTEINUR Negative 03/27/2011 1601   UROBILINOGEN 0.2 10/11/2018 1249   NITRITE NEGATIVE 10/11/2018 1249   LEUKOCYTESUR SMALL (A) 10/11/2018 1249   LEUKOCYTESUR Negative 03/27/2011 1601    Radiological Exams on Admission: Dg Chest 2 View  Result Date: 01/15/2019 CLINICAL DATA:  Chest pain EXAM: CHEST - 2 VIEW COMPARISON:  01/15/2019, CT 01/13/2019 FINDINGS: No focal opacity or pleural effusion. Stable cardiomediastinal silhouette with aortic atherosclerosis. No pneumothorax. IMPRESSION: No active cardiopulmonary disease. Electronically Signed   By: Donavan Foil M.D.   On: 01/15/2019 22:46   Dg Chest Portable 1 View  Result Date: 01/15/2019 CLINICAL DATA:  Chest pain. EXAM: PORTABLE CHEST 1 VIEW COMPARISON:  01/13/2019 FINDINGS: The heart size and mediastinal contours are within normal limits. The lungs are hyperinflated but clear. The visualized skeletal structures are unremarkable. IMPRESSION: No active cardiopulmonary abnormalities. Electronically Signed   By: Kerby Moors M.D.   On: 01/15/2019 09:31    EKG: Independently reviewed. Sinus rhythm without acute ischemic changes.  Assessment/Plan Principal Problem:   Ostealgia Active Problems:   Hypercholesterolemia   Anxiety    Polymyalgia rheumatica (HCC)   Essential hypertension   Carotid stenosis   Osteoporosis  Beth Espinoza is a 83 y.o. female with medical history significant for TIA, hypertension, hyperlipidemia, anxiety, inflammatory arthritis/polymyalgia rheumatica, osteoporosis, carotid artery stenosis, and thoracic aortic aneurysm who is admitted with uncontrolled pain of the upper chest, shoulders, upper back, and hips.   Chest/shoulders/upper back/hip pain Inflammatory arthritis/polymyalgia rheumatica: Exam consistent with musculoskeletal pain.  Reportedly occurring after first-time IV infusion of Reclast given for management of her osteoporosis.  Unclear whether side effect of ostealgia/myalgia from Reclast versus flareup of her polymyalgia rheumatica/inflammatory arthritis.  She follows with rheumatology and takes  methotrexate every Saturday for her PMR.  No headache or jaw pain to suggest GCA or jaw necrosis. -Continue pain control with as needed Tylenol, OxyIR, IV morphine for severe pain with hold parameters -Trial Flexeril tonight -Check ESR, CRP, phosphorus, TSH, CK -Consider low-dose prednisone 10-15 mg if inflammatory markers elevated with outpatient rheumatology follow-up -PT/OT eval  Hypertension: Currently stable, continue home amlodipine and atenolol.  Hyperlipidemia: Reported intolerance to statins, started on Repatha by her endocrinologist.  Thoracic aortic aneurysm: Stable dilatation of the ascending aorta to 3.9 cm on CTA chest 01/13/2019.  Carotid artery stenosis: Continue Plavix.  Anxiety and depression: Continue Paxil and as needed Xanax.  DVT prophylaxis: Subcutaneous heparin Code Status: Full code, confirmed with patient Family Communication: Discussed with son at bedside Disposition Plan: Pending clinical progress Consults called: None Admission status: Observation   Zada Finders MD [facility]  If 7PM-7AM, please contact night-coverage [email or web address]   01/16/2019, 2:01 AM

## 2019-01-16 NOTE — ED Notes (Signed)
Report from jessica, rn.  

## 2019-01-16 NOTE — Plan of Care (Signed)
Patient has worked with PT- ambulated in the hallway and back without pain or distress noted.   Daughter at bedside, Dr. Cruzita Lederer in to speak with her.  Patient reports she was anticipating pain, but everything was "much better".  Call bell in reach.  Chair alarm activated.  Will continue to assess.

## 2019-01-16 NOTE — Evaluation (Addendum)
Physical Therapy Evaluation Patient Details Name: Beth Alderfer MRN: [id number] DOB: 03-14-1935 Today's Date: 01/16/2019   History of Present Illness  Beth Espinoza is a 83 y.o. female with medical history significant for TIA, hypertension, hyperlipidemia, anxiety, inflammatory arthritis/polymyalgia rheumatica, osteoporosis, carotid artery stenosis, and thoracic aortic aneurysm who presents to the ED for evaluation of chest and upper back pain. Has had increased pain following zoledronic acid infusion for osteoporosis 01/14/19  Clinical Impression  Pt is a 83 year old female presenting to ED with R chest and back pain. Patient was able to complete STS modI with cuing for safety, and ambulate slowly with cuing needed for normalized gait 173ft with supervision needed for safety, quickly fatiguing following. Patient does report feeling less pain following ambulation, but is tired d/t first time OOB. Patient impairments in balance, strength, and activity tolerance, inhibiting her from safe ambulation, stair negotiation and transfers without supervision/assistance; inhibiting participation in ind ADLs and stair negotiation needed for her home environment. Would benefit from skilled PT to address above deficits and promote optimal return to PLOF.     Follow Up Recommendations  HHPT    Equipment Recommendations    RW   Recommendations for Other Services   None    Precautions / Restrictions   Fall     Mobility  Bed Mobility Overal bed mobility: Needs Assistance Bed Mobility: Supine to Sit     Supine to sit: Min assist     General bed mobility comments: able to negotiate LEs to EOB, minA for trunk control sitting up, good balance once on EOB  Transfers Overall transfer level: Needs assistance Equipment used: None Transfers: Sit to/from Stand Sit to Stand: Modified independent (Device/Increase time);Supervision         General transfer comment: Cuing for controlled sit, good carry over.  increased time needed  Ambulation/Gait Ambulation/Gait assistance: Supervision Gait Distance (Feet): 150 Feet Assistive device: None Gait Pattern/deviations: Decreased stride length Gait velocity: decreased   General Gait Details: decreased trunk rotation, very gaurded, wide BOS  Stairs            Wheelchair Mobility    Modified Rankin (Stroke Patients Only)       Balance Overall balance assessment: Needs assistance Sitting-balance support: No upper extremity supported Sitting balance-Leahy Scale: Good     Standing balance support: No upper extremity supported Standing balance-Leahy Scale: Good                               Pertinent Vitals/Pain Pain Assessment: 0-10 Pain Score: 8  Pain Location: R chest and back; verbalizes 8/10 but does not appear in distress, reports she feels better after walking Pain Descriptors / Indicators: Aching Pain Intervention(s): Limited activity within patient's tolerance;Monitored during session    Home Living Family/patient expects to be discharged to:: Private residence Living Arrangements: Spouse/significant other;Children Available Help at Discharge: Family Type of Home: House Home Access: Stairs to enter Entrance Stairs-Rails: None Technical brewer of Steps: 3 Home Layout: Two level Home Equipment: None      Prior Function Level of Independence: Independent         Comments: REports she drives, no AD needed for ADLs, lives with daughter on second story of home     Hand Dominance   Dominant Hand: Right    Extremity/Trunk Assessment   Upper Extremity Assessment Upper Extremity Assessment: Overall WFL for tasks assessed    Lower Extremity Assessment Lower Extremity  Assessment: Overall WFL for tasks assessed    Cervical / Trunk Assessment Cervical / Trunk Assessment: Normal  Communication   Communication: No difficulties  Cognition Arousal/Alertness: Awake/alert Behavior During  Therapy: WFL for tasks assessed/performed Overall Cognitive Status: Within Functional Limits for tasks assessed                                        General Comments      Exercises Other Exercises Other Exercises: Patient able to assist in donning shirt and pants, decent bridge to allow for pulling pants over bottom, good trunk forward flex to don shirt Other Exercises: STS modI with supervision for safety as patient stands very slowly. Cuing for controlled with good carry over Other Exercises: amb 144ft with PT cuing patient to increase foot clearance with decent carry over. Patient very gaurded, with wide BOS, and decreased trunk rotation, able to correct 50% with cuing.fatigue following   Assessment/Plan    PT Assessment Patient needs continued PT services  PT Problem List Decreased strength;Decreased mobility;Decreased coordination;Decreased activity tolerance;Decreased balance;Pain       PT Treatment Interventions      PT Goals (Current goals can be found in the Care Plan section)  Acute Rehab PT Goals Patient Stated Goal: Return to ind at home PT Goal Formulation: With patient/family Time For Goal Achievement: 01/30/19 Potential to Achieve Goals: Fair    Frequency     Barriers to discharge        Co-evaluation               AM-PAC PT "6 Clicks" Mobility  Outcome Measure                  End of Session Equipment Utilized During Treatment: Gait belt Activity Tolerance: Patient tolerated treatment well Patient left: in chair;with family/visitor present;with nursing/sitter in room;with chair alarm set Nurse Communication: Mobility status PT Visit Diagnosis: Unsteadiness on feet (R26.81);Other abnormalities of gait and mobility (R26.89);Difficulty in walking, not elsewhere classified (R26.2);Muscle weakness (generalized) (M62.81)    Time: [id number] PT Time Calculation (min) (ACUTE ONLY): 34 min   Charges:   PT Evaluation $PT Eval  Moderate Complexity: 1 Mod PT Treatments $Therapeutic Activity: 23-37 mins       Shelton Silvas PT, DPT  Shelton Silvas 01/16/2019, 4:03 PM

## 2019-01-16 NOTE — ED Notes (Signed)
Breakfast tray provided. 

## 2019-01-16 NOTE — Progress Notes (Addendum)
PROGRESS NOTE  Beth Espinoza [id number] DOB: 01-Jun-1935 DOA: 01/15/2019 PCP: [name], MD   LOS: 0 days   Brief Narrative / Interim history: 83 year old female with history of polymyalgia rheumatica on chronic methotrexate, hypertension, hyperlipidemia, anxiety, recent reported mild memory problems, who presents to the hospital and is admitted after generalized bone pain about 12 hours after receiving Reclast on Friday 11/13.  Subjective / 24h Interval events: Patient seen in the ER as well as after arrival to the floor.  He is quite uncomfortable, she is in excruciating pain and is not even able to sit up.  She complains of chest pain, bilateral shoulder pain, back pain from the bottom of her neck all the way to the lower back, bilateral hip pain.  She denies any abdominal pain, nausea or vomiting.  She denies any shortness of breath.  Assessment & Plan: Principal Problem Generalized bone pain -Likely a side effect from her Reclast infusion, will check vitamin D as low levels of vitamin D are associated with worse pain -Provide supportive treatment, patient currently in too much pain and does not want to move due to excruciating pain mainly in the sternal area making it difficult for her to sit up.   Active Problems Memory problems with component of acute in-hospital delirium -Her daughter (she is an Therapist, sports in the oncology center here) tells me that she has been having intermittent memory problems at home, she has noticed them for several months and is awaiting a neurology appointment, but patient and all other children are in denial when this is brought up. -She is slightly more confused related to mild delirium induced by hospital stay, she could not pinpoint when exactly she got the Reclast and thinks it was on Monday when in fact was on Friday.  Polymyalgia rheumatica -On methotrexate as an outpatient  Hypertension  -Currently stable, continue atenolol, amlodipine  Hyperlipidemia  -Intolerant to statins, she is on Repatha by her endocrinologist  Thoracic aortic aneurysm -Monitored as an outpatient  Carotid artery stenosis -Continue Plavix  Anxiety and depression -Continue Paxil and as needed Xanax   Scheduled Meds: . amLODipine  5 mg Oral Daily  . atenolol  25 mg Oral Daily  . clopidogrel  75 mg Oral Daily  . folic acid  2 mg Oral Daily  . heparin  5,000 Units Subcutaneous Q8H  . PARoxetine  20 mg Oral Daily   Continuous Infusions: PRN Meds:.acetaminophen **OR** acetaminophen, ALPRAZolam, morphine injection, oxyCODONE, senna-docusate  DVT prophylaxis: Heparin Code Status: Full code Family Communication: Discussed with daughter at bedside Disposition Plan: Home when ready  Consultants:  None  Procedures:  None   Microbiology  None   Antimicrobials: None     Objective: Vitals:   01/16/19 0746 01/16/19 0941 01/16/19 0943 01/16/19 1033  BP: (!) 126/57 (!) 110/47 (!) 110/47 (!) 138/55  Pulse: 77 89  72  Resp: 18   18  Temp: 98.6 F (37 C)   97.8 F (36.6 C)  TempSrc: Oral   Oral  SpO2: 93%   100%  Weight:      Height:        Intake/Output Summary (Last 24 hours) at 01/16/2019 1518 Last data filed at 01/16/2019 1011 Gross per 24 hour  Intake -  Output 600 ml  Net -600 ml   Filed Weights   01/15/19 2210  Weight: 63.5 kg    Examination:  Constitutional: NAD Eyes: lids and conjunctivae normal, no scleral icterus ENMT: Mucous membranes are moist.  Neck: normal, supple Respiratory: clear to auscultation bilaterally, no wheezing, no crackles. Normal respiratory effort.  Cardiovascular: Regular rate and rhythm, no murmurs / rubs / gallops. No LE edema.  Abdomen: no tenderness. Bowel sounds positive.  Musculoskeletal: no clubbing / cyanosis.  Skin: no rashes Neurologic: CN 2-12 grossly intact. Strength 5/5 in all 4.  Alert and oriented x3 however has some confusion as described above   Data Reviewed: I have independently  reviewed following labs and imaging studies   CBC: Recent Labs  Lab 01/15/19 0856 01/15/19 2214 01/16/19 0437  WBC 11.7* 8.8 8.2  HGB 13.7 13.6 12.6  HCT 39.2 41.9 38.5  MCV 86.3 91.1 91.2  PLT 264 259 [id number]   Basic Metabolic Panel: Recent Labs  Lab 01/13/19 1609 01/15/19 0856 01/15/19 2214 01/16/19 0437  NA  --  137 136 137  K  --  4.1 3.9 3.9  CL  --  107 102 106  CO2  --  21* 24 25  GLUCOSE  --  114* 121* 106*  BUN  --  14 21 17   CREATININE 0.90 0.77 1.04* 0.99  CALCIUM  --  8.6* 8.7* 8.0*  MG  --   --   --  2.2  PHOS  --   --   --  3.3   Liver Function Tests: Recent Labs  Lab 01/15/19 0856  AST 26  ALT 17  ALKPHOS 55  BILITOT 0.9  PROT 6.9  ALBUMIN 3.9   Coagulation Profile: No results for input(s): INR, PROTIME in the last 168 hours. HbA1C: No results for input(s): HGBA1C in the last 72 hours. CBG: No results for input(s): GLUCAP in the last 168 hours.  Recent Results (from the past 240 hour(s))  SARS CORONAVIRUS 2 (TAT 6-24 HRS) Nasopharyngeal Nasopharyngeal Swab     Status: None   Collection Time: 01/16/19 12:13 AM   Specimen: Nasopharyngeal Swab  Result Value Ref Range Status   SARS Coronavirus 2 NEGATIVE NEGATIVE Final    Comment: (NOTE) SARS-CoV-2 target nucleic acids are NOT DETECTED. The SARS-CoV-2 RNA is generally detectable in upper and lower respiratory specimens during the acute phase of infection. Negative results do not preclude SARS-CoV-2 infection, do not rule out co-infections with other pathogens, and should not be used as the sole basis for treatment or other patient management decisions. Negative results must be combined with clinical observations, patient history, and epidemiological information. The expected result is Negative. Fact Sheet for Patients: SugarRoll.be Fact Sheet for Healthcare Providers: https://www.woods-mathews.com/ This test is not yet approved or cleared by the  [place] FDA and  has been authorized for detection and/or diagnosis of SARS-CoV-2 by FDA under an Emergency Use Authorization (EUA). This EUA will remain  in effect (meaning this test can be used) for the duration of the COVID-19 declaration under Section 56 4(b)(1) of the Act, 21 U.S.C. section 360bbb-3(b)(1), unless the authorization is terminated or revoked sooner. Performed at [facility], [place] [address] [place], [place] [address]      Radiology Studies: Dg Chest 2 View  Result Date: 01/15/2019 CLINICAL DATA:  Chest pain EXAM: CHEST - 2 VIEW COMPARISON:  01/15/2019, CT 01/13/2019 FINDINGS: No focal opacity or pleural effusion. Stable cardiomediastinal silhouette with aortic atherosclerosis. No pneumothorax. IMPRESSION: No active cardiopulmonary disease. Electronically Signed   By: Donavan Foil M.D.   On: 01/15/2019 22:46    Marzetta Board, MD, PhD [facility]  Between 7 am - 7 pm I am available, please contact  me via Amion or Securechat  Between 7 pm - 7 am I am not available, please contact night coverage MD/APP via Amion

## 2019-01-16 NOTE — ED Notes (Signed)
ED TO INPATIENT HANDOFF REPORT  ED Nurse Name and Phone #:    S Name/Age/Gender Beth Espinoza 83 y.o. female Room/Bed: ED31A/ED31A  Code Status   Code Status: Full Code  Home/SNF/Other Home Patient oriented to: self, place, time and situation Is this baseline? Yes   Triage Complete: Triage complete  Chief Complaint pain after injection  Triage Note Patient brought in by ems from home. Patient with complaint of right sided chest pain that radiates to her back. Patient states that the pain started this am. Patient states that she was seen here this morning for the same and was discharged.    Allergies Allergies  Allergen Reactions  . Pneumovax [Pneumococcal Polysaccharide Vaccine] Swelling  . Pneumococcal Vaccine Swelling    Level of Care/Admitting Diagnosis ED Disposition    ED Disposition Condition [facility] Area: [place] [100120]  Level of Care: Med-Surg [16]  Covid Evaluation: Asymptomatic Screening Protocol (No Symptoms)  Diagnosis: Ostealgia [161096]  Admitting Physician: Lenore Cordia [[id number]]  Attending Physician: Lenore Cordia [[id number]]  PT Class (Do Not Modify): Observation [104]  PT Acc Code (Do Not Modify): Observation [10022]       B Medical/Surgery History Past Medical History:  Diagnosis Date  . Anxiety   . Arthritis   . Cataract   . History of chicken pox   . History of kidney stones October 2013  . Hx: UTI (urinary tract infection)   . Hyperlipidemia   . Hypertension   . Rheumatoid arthritis ([place])   . Stroke [facility])    tia   Past Surgical History:  Procedure Laterality Date  . ABDOMINAL HYSTERECTOMY    . BREAST BIOPSY Right 1995  . CATARACT EXTRACTION W/PHACO Left 04/13/2018   Procedure: CATARACT EXTRACTION PHACO AND INTRAOCULAR LENS PLACEMENT ([place]) Left Eye;  Surgeon: Birder Robson, MD;  Location: [facility] ORS;  Service: Ophthalmology;  Laterality: Left;  [place]  00:47.0 CDE 5.52 Fluid pack lot #  [id number]  . EYE SURGERY    . IR ANGIO INTRA EXTRACRAN SEL COM CAROTID INNOMINATE BILAT MOD SED  07/18/2016  . IR ANGIO VERTEBRAL SEL VERTEBRAL BILAT MOD SED  07/18/2016  . PARTIAL HYSTERECTOMY  1972  . TONSILLECTOMY AND ADENOIDECTOMY  1945     A IV Location/Drains/Wounds Patient Lines/Drains/Airways Status   Active Line/Drains/Airways    Name:   Placement date:   Placement time:   Site:   Days:   Peripheral IV 01/15/19 Left Antecubital   01/15/19    2213    Antecubital   1   Incision (Closed) 08/14/15 Groin Right   08/14/15    1106     1251   Incision (Closed) 04/13/18 Eye Left   04/13/18    1024     278          Intake/Output Last 24 hours  Intake/Output Summary (Last 24 hours) at 01/16/2019 0865 Last data filed at 01/16/2019 7846 Gross per 24 hour  Intake -  Output 300 ml  Net -300 ml    Labs/Imaging Results for orders placed or performed during the hospital encounter of 01/15/19 (from the past 48 hour(s))  Basic metabolic panel     Status: Abnormal   Collection Time: 01/15/19 10:14 PM  Result Value Ref Range   Sodium 136 135 - 145 mmol/L   Potassium 3.9 3.5 - 5.1 mmol/L   Chloride 102 98 - 111 mmol/L   CO2 24 22 - 32 mmol/L   Glucose,  Bld 121 (H) 70 - 99 mg/dL   BUN 21 8 - 23 mg/dL   Creatinine, Ser 1.04 (H) 0.44 - 1.00 mg/dL   Calcium 8.7 (L) 8.9 - 10.3 mg/dL   GFR calc non Af Amer 50 (L) >60 mL/min   GFR calc Af Amer 58 (L) >60 mL/min   Anion gap 10 5 - 15    Comment: Performed at [facility], [address] [place], [place] [address]  CBC     Status: Abnormal   Collection Time: 01/15/19 10:14 PM  Result Value Ref Range   WBC 8.8 4.0 - 10.5 K/uL   RBC 4.60 3.87 - 5.11 MIL/uL   Hemoglobin 13.6 12.0 - 15.0 g/dL   HCT 41.9 36.0 - 46.0 %   MCV 91.1 80.0 - 100.0 fL   MCH 29.6 26.0 - 34.0 pg   MCHC 32.5 30.0 - 36.0 g/dL   RDW 15.8 (H) 11.5 - 15.5 %   Platelets 259 150 - 400 K/uL   nRBC 0.0 0.0 - 0.2 %    Comment: Performed at [facility], [place], [place] [address]  Troponin I (High Sensitivity)     Status: None   Collection Time: 01/15/19 10:14 PM  Result Value Ref Range   Troponin I (High Sensitivity) 5 <18 ng/L    Comment: (NOTE) Elevated high sensitivity troponin I (hsTnI) values and significant  changes across serial measurements may suggest ACS but many other  chronic and acute conditions are known to elevate hsTnI results.  Refer to the "Links" section for chest pain algorithms and additional  guidance. Performed at [facility], [place], [place] [address]   Troponin I (High Sensitivity)     Status: None   Collection Time: 01/16/19 12:13 AM  Result Value Ref Range   Troponin I (High Sensitivity) 5 <18 ng/L    Comment: (NOTE) Elevated high sensitivity troponin I (hsTnI) values and significant  changes across serial measurements may suggest ACS but many other  chronic and acute conditions are known to elevate hsTnI results.  Refer to the "Links" section for chest pain algorithms and additional  guidance. Performed at [facility], [address] [place], [place] [address]   Phosphorus     Status: None   Collection Time: 01/16/19  4:37 AM  Result Value Ref Range   Phosphorus 3.3 2.5 - 4.6 mg/dL    Comment: Performed at [facility], [address] [place], [place] [address]  Magnesium     Status: None   Collection Time: 01/16/19  4:37 AM  Result Value Ref Range   Magnesium 2.2 1.7 - 2.4 mg/dL    Comment: Performed at [facility], [address] [place], [place] [address]  TSH     Status: None   Collection Time: 01/16/19  4:37 AM  Result Value Ref Range   TSH 3.653 0.350 - 4.500 uIU/mL    Comment: Performed by a 3rd Generation assay with a functional sensitivity of <=0.01 uIU/mL. Performed at [facility], [address] [place], [place] [address]   Basic metabolic panel     Status: Abnormal   Collection Time:  01/16/19  4:37 AM  Result Value Ref Range   Sodium 137 135 - 145 mmol/L   Potassium 3.9 3.5 - 5.1 mmol/L   Chloride 106 98 - 111 mmol/L   CO2 25 22 - 32 mmol/L   Glucose, Bld 106 (H) 70 -  99 mg/dL   BUN 17 8 - 23 mg/dL   Creatinine, Ser 0.99 0.44 - 1.00 mg/dL   Calcium 8.0 (L) 8.9 - 10.3 mg/dL   GFR calc non Af Amer 53 (L) >60 mL/min   GFR calc Af Amer >60 >60 mL/min   Anion gap 6 5 - 15    Comment: Performed at [facility], [address] [place], [place] [address]  CBC     Status: Abnormal   Collection Time: 01/16/19  4:37 AM  Result Value Ref Range   WBC 8.2 4.0 - 10.5 K/uL   RBC 4.22 3.87 - 5.11 MIL/uL   Hemoglobin 12.6 12.0 - 15.0 g/dL   HCT 38.5 36.0 - 46.0 %   MCV 91.2 80.0 - 100.0 fL   MCH 29.9 26.0 - 34.0 pg   MCHC 32.7 30.0 - 36.0 g/dL   RDW 15.8 (H) 11.5 - 15.5 %   Platelets 230 150 - 400 K/uL   nRBC 0.0 0.0 - 0.2 %    Comment: Performed at [facility], [address] [place], [place] [address]  ESR     Status: Abnormal   Collection Time: 01/16/19  4:37 AM  Result Value Ref Range   Sed Rate 35 (H) 0 - 30 mm/hr    Comment: Performed at [facility], [address] [place], [place] [address]  C-reactive protein     Status: Abnormal   Collection Time: 01/16/19  4:37 AM  Result Value Ref Range   CRP 4.4 (H) <1.0 mg/dL    Comment: Performed at [facility] [address], [place], [place] [address]  CK     Status: None   Collection Time: 01/16/19  4:37 AM  Result Value Ref Range   Total CK 41 38 - 234 U/L    Comment: Performed at [facility], [address] [place], [place] [address]   Dg Chest 2 View  Result Date: 01/15/2019 CLINICAL DATA:  Chest pain EXAM: CHEST - 2 VIEW COMPARISON:  01/15/2019, CT 01/13/2019 FINDINGS: No focal opacity or pleural effusion. Stable cardiomediastinal silhouette with aortic atherosclerosis. No pneumothorax. IMPRESSION: No active cardiopulmonary disease. Electronically Signed    By: Donavan Foil M.D.   On: 01/15/2019 22:46   Dg Chest Portable 1 View  Result Date: 01/15/2019 CLINICAL DATA:  Chest pain. EXAM: PORTABLE CHEST 1 VIEW COMPARISON:  01/13/2019 FINDINGS: The heart size and mediastinal contours are within normal limits. The lungs are hyperinflated but clear. The visualized skeletal structures are unremarkable. IMPRESSION: No active cardiopulmonary abnormalities. Electronically Signed   By: Kerby Moors M.D.   On: 01/15/2019 09:31    Pending Labs Unresulted Labs (From admission, onward)    Start     Ordered   01/16/19 0006  SARS CORONAVIRUS 2 (TAT 6-24 HRS) Nasopharyngeal Nasopharyngeal Swab  (Asymptomatic/Tier 2 Patients Labs)  Once,   STAT    Question Answer Comment  Is this test for diagnosis or screening Screening   Symptomatic for COVID-19 as defined by CDC No   Hospitalized for COVID-19 No   Admitted to ICU for COVID-19 No   Previously tested for COVID-19 No   Resident in a congregate (group) care setting No   Employed in healthcare setting No   Pregnant No      01/16/19 0005          Vitals/Pain Today's Vitals   01/16/19 0407 01/16/19 0642 01/16/19 0745 01/16/19 0746  BP: (!) 99/44   Marland Kitchen)  126/57  Pulse: 69   77  Resp: 18   18  Temp: 98.7 F (37.1 C)   98.6 F (37 C)  TempSrc: Oral   Oral  SpO2: 98%   93%  Weight:      Height:      PainSc:  Asleep 5  5     Isolation Precautions No active isolations  Medications Medications  heparin injection 5,000 Units (5,000 Units Subcutaneous Given 01/16/19 0658)  acetaminophen (TYLENOL) tablet 650 mg (has no administration in time range)    Or  acetaminophen (TYLENOL) suppository 650 mg (has no administration in time range)  oxyCODONE (Oxy IR/ROXICODONE) immediate release tablet 5 mg (has no administration in time range)  senna-docusate (Senokot-S) tablet 1 tablet (has no administration in time range)  morphine 2 MG/ML injection 1 mg (has no administration in time range)  ALPRAZolam  (XANAX) tablet 0.5 mg (has no administration in time range)  amLODipine (NORVASC) tablet 5 mg (has no administration in time range)  atenolol (TENORMIN) tablet 25 mg (has no administration in time range)  PARoxetine (PAXIL) tablet 20 mg (has no administration in time range)  folic acid (FOLVITE) tablet 2 mg (has no administration in time range)  clopidogrel (PLAVIX) tablet 75 mg (has no administration in time range)  cyclobenzaprine (FLEXERIL) tablet 5 mg (5 mg Oral Given 01/16/19 0237)  sodium chloride flush (NS) 0.9 % injection 3 mL (3 mLs Intravenous Given 01/16/19 0747)  morphine 2 MG/ML injection 2 mg (2 mg Intravenous Given 01/16/19 0014)  morphine 2 MG/ML injection 2 mg (2 mg Intravenous Given 01/16/19 0133)    Mobility walks Low fall risk   Focused Assessments medicine   R Recommendations: See Admitting Provider Note  Report given to:   Additional Notes:

## 2019-01-16 NOTE — ED Notes (Signed)
purewhick changed. Patient c/o of back pain 8/10 vss. Admitting Md at bedside to re-assess patient status. No new orders obtained. Awaiting PT this morning.

## 2019-01-16 NOTE — ED Notes (Signed)
Pt's son is reachable at [phone]

## 2019-01-16 NOTE — ED Notes (Signed)
Report given to receiving nurse patient off unit to room 133.

## 2019-01-16 NOTE — ED Notes (Addendum)
Pt reports 8/10 pain in right shoulder despite receiving morphine, Dr. Beather Arbour informed, see new orders.

## 2019-01-16 NOTE — ED Notes (Signed)
Report given to April, RN

## 2019-01-16 NOTE — ED Notes (Signed)
Assumed care of patient aox4, reports pain 5/10 to right ACW radiates into right shoulder. vss awaiting PT today. Will monitor.

## 2019-01-16 NOTE — ED Notes (Signed)
Pt screams everytime she is moved, pt requesting to use the bathroom, purewick placed

## 2019-01-17 ENCOUNTER — Ambulatory Visit (INDEPENDENT_AMBULATORY_CARE_PROVIDER_SITE_OTHER): Payer: PPO | Admitting: Nurse Practitioner

## 2019-01-17 ENCOUNTER — Encounter: Payer: Self-pay | Admitting: Internal Medicine

## 2019-01-17 DIAGNOSIS — M898X9 Other specified disorders of bone, unspecified site: Secondary | ICD-10-CM

## 2019-01-17 DIAGNOSIS — F419 Anxiety disorder, unspecified: Secondary | ICD-10-CM

## 2019-01-17 DIAGNOSIS — I10 Essential (primary) hypertension: Secondary | ICD-10-CM

## 2019-01-17 DIAGNOSIS — R52 Pain, unspecified: Secondary | ICD-10-CM

## 2019-01-17 DIAGNOSIS — I6523 Occlusion and stenosis of bilateral carotid arteries: Secondary | ICD-10-CM

## 2019-01-17 MED ORDER — VITAMIN D 25 MCG (1000 UNIT) PO TABS
5000.0000 [IU] | ORAL_TABLET | Freq: Every day | ORAL | Status: DC
  Administered 2019-01-17 – 2019-01-18 (×2): 5000 [IU] via ORAL
  Filled 2019-01-17 (×2): qty 5

## 2019-01-17 MED ORDER — OXYCODONE HCL 5 MG PO TABS
5.0000 mg | ORAL_TABLET | ORAL | Status: DC | PRN
  Administered 2019-01-17: 10 mg via ORAL
  Administered 2019-01-17 (×2): 5 mg via ORAL
  Filled 2019-01-17: qty 1
  Filled 2019-01-17: qty 2
  Filled 2019-01-17: qty 1

## 2019-01-17 NOTE — Plan of Care (Signed)

## 2019-01-17 NOTE — Evaluation (Signed)
Occupational Therapy Evaluation Patient Details Name: Beth Espinoza MRN: [id number] DOB: [birth year] Today's Date: 01/17/2019    History of Present Illness Beth Espinoza is a 83 y.o. female with medical history significant for TIA, hypertension, hyperlipidemia, anxiety, inflammatory arthritis/polymyalgia rheumatica, osteoporosis, carotid artery stenosis, and thoracic aortic aneurysm who presents to the ED for evaluation of chest and upper back pain. Has had increased pain following zoledronic acid infusion for osteoporosis 01/14/19   Clinical Impression   Pt is 83 year old female who presents with new onset of chest and upper back pain following zoledronic acid infusion for osteoporosis on 01/14/19.  She lives at home with her husband and daughter and she has large bedroom with a couch on 2nd story and her husband has his room down the hall and her daughter is downstairs.  She is mainly limited by pain currently which is located in center of chest and also has anxiety with any movement in bed.  She needed extra time to sit up in bed and declined getting out of bed or sitting at EOB due to high pain level but unable to rate it.  She had recently received pain medication increase and was feeling very anxious and needed reassurance and relaxation tech to be able to focus on session.  She currently requires set up only for grooming skills with mod cues to stay focused on task and max assist for LB dressing and bathing skills and might benefit from AD for LB dressing if pain persists.  Rec continued OT while in hospital to continue to work on increasing independence in ADLs, balance and functional mobility training, and family ed and training and OT [facility] after discharge.      Follow Up Recommendations  Home health OT    Equipment Recommendations  Other (comment)(might need reacher and sock aid for LB dressing if pain does not improve)    Recommendations for Other Services       Precautions / Restrictions  Precautions Precautions: Fall Restrictions Weight Bearing Restrictions: No      Mobility Bed Mobility                  Transfers                      Balance                                           ADL either performed or assessed with clinical judgement   ADL Overall ADL's : Needs assistance/impaired Eating/Feeding: Independent;Set up   Grooming: Wash/dry hands;Wash/dry face;Oral care;Applying deodorant;Set up;Brushing hair;Independent   Upper Body Bathing: Independent;Set up   Lower Body Bathing: Moderate assistance;Set up;Bed level Lower Body Bathing Details (indicate cue type and reason): pt not able to sit up at EOB due to pain and seen for bed level ADLs Upper Body Dressing : Independent;Set up   Lower Body Dressing: Moderate assistance;Set up;Bed level                 General ADL Comments: Pt has fluctuating pain levels and currrently in pain but unable to state number but states it is in center of chest and limits ability to move or sit at EOB this session.  She had only received pain meds a few minutes before session.  Needs a lot of encouragement to participate in activity. Reviewed relaxation tech and  deep breathing.     Vision Patient Visual Report: No change from baseline       Perception     Praxis      Pertinent Vitals/Pain Pain Assessment: Faces Faces Pain Scale: Hurts little more Pain Location: center of chest that radiates from front to back Pain Descriptors / Indicators: Aching Pain Intervention(s): Limited activity within patient's tolerance;Monitored during session;Premedicated before session;Repositioned;Utilized relaxation techniques     Hand Dominance Right   Extremity/Trunk Assessment Upper Extremity Assessment Upper Extremity Assessment: Overall WFL for tasks assessed   Lower Extremity Assessment Lower Extremity Assessment: Defer to PT evaluation   Cervical / Trunk Assessment Cervical /  Trunk Assessment: Normal   Communication Communication Communication: No difficulties   Cognition Arousal/Alertness: Awake/alert Behavior During Therapy: Anxious Overall Cognitive Status: Within Functional Limits for tasks assessed                                 General Comments: Pt has difficulty sustaining attention to task and limited due to pain. Needs relaxation tech and redirection with deep breathing.   General Comments       Exercises     Shoulder Instructions      Home Living Family/patient expects to be discharged to:: Private residence Living Arrangements: Spouse/significant other;Children Available Help at Discharge: Family Type of Home: House Home Access: Stairs to enter Technical brewer of Steps: 3 Entrance Stairs-Rails: None Home Layout: Two level Alternate Level Stairs-Number of Steps: flight, pt bedroom on 2nd story Alternate Level Stairs-Rails: Left Bathroom Shower/Tub: Occupational psychologist: Standard Bathroom Accessibility: No   Home Equipment: None          Prior Functioning/Environment Level of Independence: Independent        Comments: REports she drives, no AD needed for ADLs, lives with daughter on second story of home        OT Problem List: Decreased strength;Decreased activity tolerance;Impaired balance (sitting and/or standing);Pain;Decreased knowledge of use of DME or AE      OT Treatment/Interventions: Self-care/ADL training;Patient/family education;Balance training;DME and/or AE instruction;Other (comment)(relaxation tech to help with anxiety)    OT Goals(Current goals can be found in the care plan section) Acute Rehab OT Goals Patient Stated Goal: return to being independent at home again OT Goal Formulation: With patient Time For Goal Achievement: 01/31/19 Potential to Achieve Goals: Good ADL Goals Pt Will Perform Lower Body Dressing: with set-up;with supervision;sit to/from stand;with  adaptive equipment Pt Will Transfer to Toilet: with set-up;with supervision;regular height toilet;grab bars Pt Will Perform Toileting - Clothing Manipulation and hygiene: with set-up;Independently Pt/caregiver will Perform Home Exercise Program: With written HEP provided  OT Frequency: Min 1X/week   Barriers to D/C:            Co-evaluation              AM-PAC OT "6 Clicks" Daily Activity     Outcome Measure Help from another person eating meals?: None Help from another person taking care of personal grooming?: None Help from another person toileting, which includes using toliet, bedpan, or urinal?: A Lot Help from another person bathing (including washing, rinsing, drying)?: A Lot Help from another person to put on and taking off regular upper body clothing?: None Help from another person to put on and taking off regular lower body clothing?: A Lot 6 Click Score: 18   End of Session Nurse Communication: Other (comment)(400 mls emptied  from urine holder due to pt anxious about it over flowing and NSG Lauren updated)  Activity Tolerance: Patient limited by pain Patient left: in bed;with call bell/phone within reach;with bed alarm set  OT Visit Diagnosis: Pain;Muscle weakness (generalized) (M62.81) Pain - Right/Left: (center of chest)                Time: 0930-1000 OT Time Calculation (min): 30 min Charges:  OT General Charges $OT Visit: 1 Visit OT Evaluation $OT Eval Low Complexity: 1 Low OT Treatments $Self Care/Home Management : 8-22 mins  Chrys Racer, OTR/L, [place] ascom [phone] 01/17/19, 10:33 AM

## 2019-01-17 NOTE — Progress Notes (Addendum)
PROGRESS NOTE  Beth Espinoza [id number] DOB: 03-31-1935 DOA: 01/15/2019 PCP: [name], MD   LOS: 1 day   Brief Narrative / Interim history: 83 year old female with history of polymyalgia rheumatica on chronic methotrexate, hypertension, hyperlipidemia, anxiety, recent reported mild memory problems, who presents to the hospital and is admitted after generalized bone pain about 12 hours after receiving Reclast on Friday 11/13.  Subjective / 24h Interval events: She slept a bit better last night, however this morning again she has excruciating sternal pain with little movement and does want to get up.  Assessment & Plan: Principal Problem Generalized bone pain -Likely a side effect from her Reclast infusion -Provide supportive treatment, still in severe pain with movement, increased pain medications today -Await PT eval  Active Problems Memory problems with component of acute in-hospital delirium -Her daughter (she is an Therapist, sports in the oncology center here) tells me that she has been having intermittent memory problems at home, she has noticed them for several months and is awaiting a neurology appointment, but patient and all other children dispute that -She seems to be alert and oriented x4 this morning, however still cannot recall the day of the week in which she got Reclast -daughter very concerned today regarding persistent mental status changes, will order MRI brain  Vitamin D deficiency -Likely reason for her severe pain as she was deficient at the time of the infusion, start vitamin D 5000 units daily  Polymyalgia rheumatica -On methotrexate as an outpatient  Hypertension  -Currently stable, continue atenolol, amlodipine.  Blood pressure controlled today  Hyperlipidemia -Intolerant to statins, she is on Repatha by her endocrinologist  Thoracic aortic aneurysm -Monitored as an outpatient  Carotid artery stenosis -Continue Plavix  Anxiety and depression -Continue Paxil  and as needed Xanax   Scheduled Meds: . amLODipine  5 mg Oral Daily  . atenolol  25 mg Oral Daily  . cholecalciferol  5,000 Units Oral Daily  . clopidogrel  75 mg Oral Daily  . folic acid  2 mg Oral Daily  . heparin  5,000 Units Subcutaneous Q8H  . PARoxetine  20 mg Oral Daily   Continuous Infusions: PRN Meds:.acetaminophen **OR** acetaminophen, ALPRAZolam, morphine injection, oxyCODONE, senna-docusate  DVT prophylaxis: Heparin Code Status: Full code Family Communication: No family at bedside this morning, will discuss with daughter later Disposition Plan: Home when ready  Consultants:  None  Procedures:  None   Microbiology  None   Antimicrobials: None     Objective: Vitals:   01/16/19 1945 01/16/19 2041 01/17/19 0520 01/17/19 0700  BP:  (!) 123/50 (!) 118/41 (!) 132/51  Pulse:  65 65 67  Resp:    18  Temp:  98.1 F (36.7 C) 98.1 F (36.7 C) 97.7 F (36.5 C)  TempSrc:  Oral Oral Oral  SpO2: 96% 98% 99% 100%  Weight:      Height:        Intake/Output Summary (Last 24 hours) at 01/17/2019 0943 Last data filed at 01/16/2019 1011 Gross per 24 hour  Intake -  Output 300 ml  Net -300 ml   Filed Weights   01/15/19 2210  Weight: 63.5 kg    Examination:  Constitutional: No distress, laying in bed Eyes: No scleral icterus seen ENMT: Moist mucous membranes Neck: normal, supple Respiratory: Clear bilaterally on anterior auscultation, no wheezing, no crackles (patient resistant to sit up) Cardiovascular: Regular rate and rhythm, no murmurs.  No peripheral edema Abdomen: Soft, nontender, nondistended, positive bowel sounds Musculoskeletal: no clubbing /  cyanosis.  Skin: No rashes seen Neurologic: Nonfocal, equal strength.  Alert and oriented x4 but poor recollection of the infusion date as above   Data Reviewed: I have independently reviewed following labs and imaging studies   CBC: Recent Labs  Lab 01/15/19 0856 01/15/19 2214 01/16/19 0437  WBC  11.7* 8.8 8.2  HGB 13.7 13.6 12.6  HCT 39.2 41.9 38.5  MCV 86.3 91.1 91.2  PLT 264 259 [id number]   Basic Metabolic Panel: Recent Labs  Lab 01/13/19 1609 01/15/19 0856 01/15/19 2214 01/16/19 0437  NA  --  137 136 137  K  --  4.1 3.9 3.9  CL  --  107 102 106  CO2  --  21* 24 25  GLUCOSE  --  114* 121* 106*  BUN  --  14 21 17   CREATININE 0.90 0.77 1.04* 0.99  CALCIUM  --  8.6* 8.7* 8.0*  MG  --   --   --  2.2  PHOS  --   --   --  3.3   Liver Function Tests: Recent Labs  Lab 01/15/19 0856  AST 26  ALT 17  ALKPHOS 55  BILITOT 0.9  PROT 6.9  ALBUMIN 3.9   Coagulation Profile: No results for input(s): INR, PROTIME in the last 168 hours. HbA1C: No results for input(s): HGBA1C in the last 72 hours. CBG: No results for input(s): GLUCAP in the last 168 hours.  Recent Results (from the past 240 hour(s))  SARS CORONAVIRUS 2 (TAT 6-24 HRS) Nasopharyngeal Nasopharyngeal Swab     Status: None   Collection Time: 01/16/19 12:13 AM   Specimen: Nasopharyngeal Swab  Result Value Ref Range Status   SARS Coronavirus 2 NEGATIVE NEGATIVE Final    Comment: (NOTE) SARS-CoV-2 target nucleic acids are NOT DETECTED. The SARS-CoV-2 RNA is generally detectable in upper and lower respiratory specimens during the acute phase of infection. Negative results do not preclude SARS-CoV-2 infection, do not rule out co-infections with other pathogens, and should not be used as the sole basis for treatment or other patient management decisions. Negative results must be combined with clinical observations, patient history, and epidemiological information. The expected result is Negative. Fact Sheet for Patients: SugarRoll.be Fact Sheet for Healthcare Providers: https://www.woods-mathews.com/ This test is not yet approved or cleared by the [place] FDA and  has been authorized for detection and/or diagnosis of SARS-CoV-2 by FDA under an Emergency Use  Authorization (EUA). This EUA will remain  in effect (meaning this test can be used) for the duration of the COVID-19 declaration under Section 56 4(b)(1) of the Act, 21 U.S.C. section 360bbb-3(b)(1), unless the authorization is terminated or revoked sooner. Performed at [facility], [place] [address], [place], [place] [address]      Radiology Studies: No results found.  Marzetta Board, MD, PhD [facility]  Between 7 am - 7 pm I am available, please contact me via Amion or Securechat  Between 7 pm - 7 am I am not available, please contact night coverage MD/APP via Amion

## 2019-01-18 ENCOUNTER — Inpatient Hospital Stay: Payer: PPO

## 2019-01-18 DIAGNOSIS — M353 Polymyalgia rheumatica: Secondary | ICD-10-CM

## 2019-01-18 DIAGNOSIS — E7800 Pure hypercholesterolemia, unspecified: Secondary | ICD-10-CM

## 2019-01-18 DIAGNOSIS — M810 Age-related osteoporosis without current pathological fracture: Secondary | ICD-10-CM

## 2019-01-18 MED ORDER — TRAMADOL HCL 50 MG PO TABS: 50.0000 mg | ORAL_TABLET | Freq: Four times a day (QID) | ORAL | 0 refills | Status: AC | PRN

## 2019-01-18 MED ORDER — VITAMIN D3 25 MCG PO TABS: 5000.0000 [IU] | ORAL_TABLET | Freq: Every day | ORAL | 1 refills | Status: AC

## 2019-01-18 MED ORDER — KETOROLAC TROMETHAMINE 10 MG PO TABS: 10.0000 mg | ORAL_TABLET | Freq: Four times a day (QID) | ORAL | 0 refills | Status: DC | PRN

## 2019-01-18 NOTE — TOC Initial Note (Signed)
Transition of Care [facility]) - Initial/Assessment Note    Patient Details  Name: Beth Espinoza MRN: [id number] Date of Birth: [birth year]  Transition of [facility]) CM/SW Contact:    Shelbie Hutching, RN Phone Number: 01/18/2019, 9:27 AM  Clinical Narrative:                 Patient admitted after having a Reclast infusion on 01/14/19 and experiencing pain and weakness and fatigue afterwards.  Patient is doing much better and has been medically cleared for discharge.  Patient will discharge home with home health services.  Patient lives with her daughter and her husband.  Daughter [name] works in the [employer] here at [place] will provide discharge home.  Patient has a rollator at home.  Daughter chooses [facility] for home health and referral given to [place] with [facility] for PT.  [place] with [facility] agrees to accept home health referral.  Patient is current with PCP.  Pharmacy is [facility] in [place].    Expected Discharge Plan: [place] [facility] Barriers to Discharge: No Barriers Identified   Patient Goals and CMS Choice   CMS Medicare.gov Compare Post Acute Care list provided to:: Patient Represenative (must comment) Choice offered to / list presented to : Adult Children(daughter Doni)  Expected Discharge Plan and Services Expected Discharge Plan: [place]   Discharge Planning Services: CM Consult Post Acute Care Choice: [place] arrangements for the past 2 months: Single Family Home Expected Discharge Date: 01/18/19                         HH Arranged: PT HH Agency: [facility] Date [place]: 01/18/19 Time [place]: [phone] Representative spoke with at [place]: Jana Half  Prior Living Arrangements/Services Living arrangements for the past 2 months: [place] with:: Spouse, Adult Children Patient language and need for interpreter reviewed:: Yes Do you feel safe  going back to the place where you live?: Yes      Need for Family Participation in Patient Care: Yes (Comment) Care giver support system in place?: Yes (comment)(daughter and husband) Current home services: Other (comment)(rollator) Criminal Activity/Legal Involvement Pertinent to Current Situation/Hospitalization: No - Comment as needed  Activities of Daily Living Home Assistive Devices/Equipment: None ADL Screening (condition at time of admission) Patient's cognitive ability adequate to safely complete daily activities?: Yes Is the patient deaf or have difficulty hearing?: No Does the patient have difficulty seeing, even when wearing glasses/contacts?: No Does the patient have difficulty concentrating, remembering, or making decisions?: No Patient able to express need for assistance with ADLs?: Yes Does the patient have difficulty dressing or bathing?: No Independently performs ADLs?: Yes (appropriate for developmental age) Does the patient have difficulty walking or climbing stairs?: No Weakness of Legs: None Weakness of Arms/Hands: None  Permission Sought/Granted Permission sought to share information with : Case Manager, Family Supports, Other (comment) Permission granted to share information with : Yes, Verbal Permission Granted     Permission granted to share info w AGENCY: [facility]  Permission granted to share info w Relationship: Daughter Doni     Emotional Assessment Appearance:: Appears stated age Attitude/Demeanor/Rapport: Engaged Affect (typically observed): Accepting Orientation: : Oriented to Self, Oriented to Place, Oriented to  Time, Oriented to Situation Alcohol / Substance Use: Not Applicable Psych Involvement: No (comment)  Admission diagnosis:  Ostealgia [M89.8X9] Intractable pain [R52] Patient Active Problem  List   Diagnosis Date Noted  . Ostealgia 01/16/2019  . Generalized pain 01/16/2019  . Memory change 10/16/2018  . Osteoporosis 10/11/2018  . Fall  08/19/2017  . Ascending aortic aneurysm ([place]) 01/06/2017  . Hx of migraines 12/12/2016  . Pseudophakia of right eye 10/07/2016  . Carotid stenosis 07/24/2015  . Ectatic thoracic aorta ([place]) 07/24/2015  . TIA (transient ischemic attack) 07/20/2015  . Dysphagia 06/10/2015  . Essential hypertension 02/11/2015  . Health care maintenance 04/09/2014  . Difficulty sleeping 01/25/2014  . Left carotid bruit 01/25/2014  . Polymyalgia rheumatica ([place]) 07/12/2013  . Epiretinal membrane (ERM) of left eye 11/29/2012  . Nuclear sclerotic cataract of both eyes 11/29/2012  . ERM OD (epiretinal membrane, right eye) 11/04/2012  . Hypercholesterolemia 06/25/2012  . History of colonic polyps 06/25/2012  . Anxiety 06/25/2012  . Nephrolithiasis 06/25/2012   PCP:  [name], MD Pharmacy:   [place], [place] [address] Phone: [phone] Fax: [phone]  [place], [address] [address] [place] [address] Phone: [phone] Fax: [phone]     Social Determinants of Health (SDOH) Interventions    Readmission Risk Interventions No flowsheet data found.

## 2019-01-18 NOTE — Progress Notes (Signed)
Patient discharged with personal belongings, VSS, via w/c to personal vehicle with daughter " Beth Espinoza " without incidence. Removed IV from LFA , tolerated well.

## 2019-01-18 NOTE — Discharge Summary (Signed)
Physician Discharge Summary  Beth Espinoza [id number] DOB: 08/19/1935 DOA: 01/15/2019  PCP: [name], MD  Admit date: 01/15/2019 Discharge date: 01/18/2019  Admitted From: home Disposition:  home  Recommendations for Outpatient Follow-up:  1. Follow up with PCP in 1-2 weeks  Home Health: PT Equipment/Devices: none  Discharge Condition: stable CODE STATUS: Full code Diet recommendation: regular  HPI: Per admitting MD, Beth Espinoza is a 83 y.o. female with medical history significant for TIA, hypertension, hyperlipidemia, anxiety, inflammatory arthritis/polymyalgia rheumatica, osteoporosis, carotid artery stenosis, and thoracic aortic aneurysm who presents to the ED for evaluation of chest and upper back pain. Patient has a history of osteoporosis and underwent infusion of first dose of zoledronic acid (Reclast) on 01/14/2019.  Patient states that since then she has been having generalized fatigue with pain across her upper chest, upper back, shoulders, and hips.  She has been having persistent pain which she says is significant and limiting her ability to sleep, walk, or complete any ADLs.  She has not had any jaw pain, headache, fevers, chills, diaphoresis, nausea, vomiting, shortness of breath, dysuria, abdominal pain, or diarrhea. She initially came to the ED morning of 01/15/2019 and had improvement of pain with Percocet and was discharged home with prescription for tramadol.  EKG was without acute changes and troponin was 5.  Portable chest x-ray was without acute cardiopulmonary abnormalities. Patient had continued pain and was doubling up on her tramadol and represented to the ED for further evaluation and management. ED Course:  Initial vitals showed BP 145/110, pulse 73, RR 22, temp 99.4 Fahrenheit, SPO2 97% on room air. Labs notable for sodium 136, potassium 3.9, bicarb 24, BUN 21, creatinine 1.04, WBC 8.8, hemoglobin 13.6, platelets 259,000, high-sensitivity troponin I 5x2.   SARS-CoV-2 test was obtained and pending. 2 view chest x-ray was negative for focal consolidation, pleural effusion, or edema. Patient was given IV morphine 2 mg x 2 with continued significant pain.  The hospitalist service was consulted admit for further evaluation and management.  Hospital Course / Discharge diagnoses: Principal Problem Generalized bone pain -Likely a side effect from her Reclast infusion, patient received supportive treatment with analgesics, she was also found to have low vitamin D level and this was supplemented.  With conservative management she improved, her pain has improved and she is more mobile and will be discharged home in stable condition with home health physical therapy.  Active Problems Memory problems with component of acute in-hospital delirium -Her daughtertells me that she has been having very mild intermittent memory problems at home, she has noticed them for several months and is awaiting a neurology appointment.  Given worsening mental status in the hospital she underwent an MRI of the brain which was negative for acute findings but it did show some chronic old lacunar infarcts Vitamin D deficiency -Likely reason for her severe pain as she was deficient at the time of the infusion, start vitamin D 5000 units daily Polymyalgia rheumatica -On methotrexate as an outpatient Hypertension -continue home medications on discharge Hyperlipidemia -Intolerant to statins, she is on Repatha by her endocrinologist Thoracic aortic aneurysm -Monitored as an outpatient Carotid artery stenosis -Continue Plavix Anxiety and depression -Continue home medications on discharge  Discharge Instructions  Allergies as of 01/18/2019      Reactions   Pneumovax [pneumococcal Polysaccharide Vaccine] Swelling   Pneumococcal Vaccine Swelling   Reclast [zoledronic Acid]       Medication List    TAKE these medications   ALPRAZolam 0.5  MG tablet Commonly known as: XANAX Take 1  tablet (0.5 mg total) by mouth daily as needed for anxiety.   amLODipine 5 MG tablet Commonly known as: NORVASC Take 1 tablet (5 mg total) by mouth daily.   ascorbic acid 1000 MG tablet Commonly known as: VITAMIN C Take 1,000 mg by mouth daily.   atenolol 25 MG tablet Commonly known as: TENORMIN Take 1 tablet (25 mg total) by mouth daily. Must keep appt in June for additional refills   clopidogrel 75 MG tablet Commonly known as: PLAVIX Take 1 tablet (75 mg total) by mouth daily.   folic acid 1 MG tablet Commonly known as: FOLVITE Take 2 mg by mouth daily.   ketorolac 10 MG tablet Commonly known as: TORADOL Take 1 tablet (10 mg total) by mouth every 6 (six) hours as needed.   methotrexate 2.5 MG tablet Commonly known as: RHEUMATREX Take 6 tablets per week. What changed:   how much to take  how to take this  when to take this  additional instructions   PARoxetine 20 MG tablet Commonly known as: PAXIL Take 1 tablet (20 mg total) by mouth daily.   traMADol 50 MG tablet Commonly known as: Ultram Take 1 tablet (50 mg total) by mouth every 6 (six) hours as needed.   Vitamin D3 25 MCG tablet Commonly known as: Vitamin D Take 5 tablets (5,000 Units total) by mouth daily.      Consultations:  None   Procedures/Studies:  Dg Chest 2 View  Result Date: 01/15/2019 CLINICAL DATA:  Chest pain EXAM: CHEST - 2 VIEW COMPARISON:  01/15/2019, CT 01/13/2019 FINDINGS: No focal opacity or pleural effusion. Stable cardiomediastinal silhouette with aortic atherosclerosis. No pneumothorax. IMPRESSION: No active cardiopulmonary disease. Electronically Signed   By: Donavan Foil M.D.   On: 01/15/2019 22:46   Mr Brain Wo Contrast  Result Date: 01/18/2019 CLINICAL DATA:  83 year old female with unexplained altered mental status. Polymyalgia rheumatica on chronic methotrexate. EXAM: MRI HEAD WITHOUT CONTRAST TECHNIQUE: Multiplanar, multiecho pulse sequences of the brain and  surrounding structures were obtained without intravenous contrast. COMPARISON:  Brain MRI and intracranial MRA 04/21/2016 and earlier. FINDINGS: Brain: Small chronic infarcts in the bilateral basal ganglia and right thalamus are stable. No restricted diffusion to suggest acute infarction. No midline shift, mass effect, evidence of mass lesion, ventriculomegaly, extra-axial collection or acute intracranial hemorrhage. Cervicomedullary junction and pituitary are within normal limits. Additional scattered mild for age nonspecific cerebral white matter T2 and FLAIR hyperintensity. No cortical encephalomalacia or chronic cerebral blood products identified. Vascular: Major intracranial vascular flow voids appear stable since 2018. Skull and upper cervical spine: Negative visible cervical spine. Normal bone marrow signal. Sinuses/Orbits: Interval postoperative changes to both globes. Otherwise negative orbits. Paranasal Visualized paranasal sinuses and mastoids are stable and well pneumatized. Other: Grossly normal visible internal auditory structures. Trace retained secretions in the left nasopharynx. Negative scalp and face soft tissues. IMPRESSION: 1. No acute intracranial abnormality. 2. Stable non-contrast MRI appearance of the brain since 2018, small chronic lacunar infarcts in the bilateral basal ganglia and right thalamus. Electronically Signed   By: Genevie Ann M.D.   On: 01/18/2019 03:32   Dg Chest Portable 1 View  Result Date: 01/15/2019 CLINICAL DATA:  Chest pain. EXAM: PORTABLE CHEST 1 VIEW COMPARISON:  01/13/2019 FINDINGS: The heart size and mediastinal contours are within normal limits. The lungs are hyperinflated but clear. The visualized skeletal structures are unremarkable. IMPRESSION: No active cardiopulmonary abnormalities. Electronically Signed  By: Kerby Moors M.D.   On: 01/15/2019 09:31   Ct Angio Chest Aorta W/cm &/or Wo/cm  Result Date: 01/13/2019 CLINICAL DATA:  Follow-up thoracic aortic  aneurysm EXAM: CT ANGIOGRAPHY CHEST WITH CONTRAST TECHNIQUE: Multidetector CT imaging of the chest was performed using the standard protocol during bolus administration of intravenous contrast. Multiplanar CT image reconstructions and MIPs were obtained to evaluate the vascular anatomy. CONTRAST:  57mL OMNIPAQUE IOHEXOL 350 MG/ML SOLN COMPARISON:  01/11/2018 FINDINGS: Cardiovascular: Dilatation of the ascending aorta to 3.9 cm is noted. This is stable from the prior exam. Atherosclerotic calcifications are seen. Stable dilatation in the distal thoracic aortic arch is noted. No dissection in the descending thoracic aorta is seen. The overall appearance is stable from the prior exam. No cardiac enlargement is noted. The pulmonary artery shows no large central pulmonary embolus. No significant coronary calcifications are noted. No pericardial effusion is seen. Mediastinum/Nodes: Thoracic inlet demonstrates scattered hypodensities throughout the thyroid gland stable from prior exam. No hilar or mediastinal adenopathy is noted. The esophagus as visualized is within normal limits with the exception of a small sliding-type hiatal hernia. Lungs/Pleura: Lungs are well aerated bilaterally. Mild apical scarring is seen bilaterally. Stable subpleural nodule is noted in the right upper lobe laterally. No other sizable nodule is seen. No other focal abnormality is seen. Upper Abdomen: Hypodensity is again noted in the lateral aspect of the right lobe of the liver likely representing a cyst or hemangioma. This is stable from the prior exam. The remainder of the upper abdomen is unremarkable. Musculoskeletal: Degenerative changes of the thoracic spine are seen. No acute rib abnormality is noted. Review of the MIP images confirms the above findings. IMPRESSION: Stable dilatation of the ascending aorta to 3.9 cm. Recommend annual imaging followup by CTA or MRA. This recommendation follows 2010  ACCF/AHA/AATS/ACR/ASA/SCA/SCAI/SIR/STS/SVM Guidelines for the Diagnosis and Management of Patients with Thoracic Aortic Disease. Circulation. 2010; [id number]. Aortic aneurysm NOS (ICD10-I71.9) Stable upper lobe nodular density. Given its long-term stability it is felt to be benign in etiology. Small hiatal hernia stable from the prior exam. Aortic Atherosclerosis (ICD10-I70.0). Electronically Signed   By: Inez Catalina M.D.   On: 01/13/2019 19:46      Subjective: - no chest pain, shortness of breath, no abdominal pain, nausea or vomiting.   Discharge Exam: BP (!) 148/56 (BP Location: Left Arm)    Pulse 64    Temp 97.8 F (36.6 C) (Oral)    Resp 18    Ht 5\' 5"  (1.651 m)    Wt 63.5 kg    SpO2 98%    BMI 23.30 kg/m   General: Pt is alert, awake, not in acute distress Cardiovascular: RRR, S1/S2 +, no rubs, no gallops Respiratory: no wheezing Extremities: no edema, no cyanosis Neuro: non focal, equal stregth    The results of significant diagnostics from this hospitalization (including imaging, microbiology, ancillary and laboratory) are listed below for reference.     Microbiology: Recent Results (from the past 240 hour(s))  SARS CORONAVIRUS 2 (TAT 6-24 HRS) Nasopharyngeal Nasopharyngeal Swab     Status: None   Collection Time: 01/16/19 12:13 AM   Specimen: Nasopharyngeal Swab  Result Value Ref Range Status   SARS Coronavirus 2 NEGATIVE NEGATIVE Final    Comment: (NOTE) SARS-CoV-2 target nucleic acids are NOT DETECTED. The SARS-CoV-2 RNA is generally detectable in upper and lower respiratory specimens during the acute phase of infection. Negative results do not preclude SARS-CoV-2 infection, do not rule out  co-infections with other pathogens, and should not be used as the sole basis for treatment or other patient management decisions. Negative results must be combined with clinical observations, patient history, and epidemiological information. The expected result is  Negative. Fact Sheet for Patients: SugarRoll.be Fact Sheet for Healthcare Providers: https://www.woods-mathews.com/ This test is not yet approved or cleared by the [place] FDA and  has been authorized for detection and/or diagnosis of SARS-CoV-2 by FDA under an Emergency Use Authorization (EUA). This EUA will remain  in effect (meaning this test can be used) for the duration of the COVID-19 declaration under Section 56 4(b)(1) of the Act, 21 U.S.C. section 360bbb-3(b)(1), unless the authorization is terminated or revoked sooner. Performed at [facility], [place] [address] [place], [place] [address]      Labs: Basic Metabolic Panel: Recent Labs  Lab 01/13/19 1609 01/15/19 0856 01/15/19 2214 01/16/19 0437  NA  --  137 136 137  K  --  4.1 3.9 3.9  CL  --  107 102 106  CO2  --  21* 24 25  GLUCOSE  --  114* 121* 106*  BUN  --  14 21 17   CREATININE 0.90 0.77 1.04* 0.99  CALCIUM  --  8.6* 8.7* 8.0*  MG  --   --   --  2.2  PHOS  --   --   --  3.3   Liver Function Tests: Recent Labs  Lab 01/15/19 0856  AST 26  ALT 17  ALKPHOS 55  BILITOT 0.9  PROT 6.9  ALBUMIN 3.9   CBC: Recent Labs  Lab 01/15/19 0856 01/15/19 2214 01/16/19 0437  WBC 11.7* 8.8 8.2  HGB 13.7 13.6 12.6  HCT 39.2 41.9 38.5  MCV 86.3 91.1 91.2  PLT 264 259 230   CBG: No results for input(s): GLUCAP in the last 168 hours. Hgb A1c No results for input(s): HGBA1C in the last 72 hours. Lipid Profile No results for input(s): CHOL, HDL, LDLCALC, TRIG, CHOLHDL, LDLDIRECT in the last 72 hours. Thyroid function studies Recent Labs    01/16/19 0437  TSH 3.653   Urinalysis    Component Value Date/Time   COLORURINE YELLOW 10/11/2018 1249   APPEARANCEUR CLEAR 10/11/2018 1249   APPEARANCEUR Clear 03/27/2011 1601   LABSPEC 1.020 10/11/2018 1249   LABSPEC 1.004 03/27/2011 1601   PHURINE 7.0 10/11/2018 1249   GLUCOSEU NEGATIVE 10/11/2018 1249    HGBUR NEGATIVE 10/11/2018 1249   BILIRUBINUR NEGATIVE 10/11/2018 1249   BILIRUBINUR Negative 03/27/2011 1601   KETONESUR NEGATIVE 10/11/2018 1249   PROTEINUR Negative 03/27/2011 1601   UROBILINOGEN 0.2 10/11/2018 1249   NITRITE NEGATIVE 10/11/2018 1249   LEUKOCYTESUR SMALL (A) 10/11/2018 1249   LEUKOCYTESUR Negative 03/27/2011 1601    FURTHER DISCHARGE INSTRUCTIONS:   Get Medicines reviewed and adjusted: Please take all your medications with you for your next visit with your Primary MD   Laboratory/radiological data: Please request your Primary MD to go over all hospital tests and procedure/radiological results at the follow up, please ask your Primary MD to get all Hospital records sent to his/her office.   In some cases, they will be blood work, cultures and biopsy results pending at the time of your discharge. Please request that your primary care M.D. goes through all the records of your hospital data and follows up on these results.   Also Note the following: If you experience worsening of your admission symptoms, develop shortness of breath, life threatening emergency, suicidal or  homicidal thoughts you must seek medical attention immediately by calling 911 or calling your MD immediately  if symptoms less severe.   You must read complete instructions/literature along with all the possible adverse reactions/side effects for all the Medicines you take and that have been prescribed to you. Take any new Medicines after you have completely understood and accpet all the possible adverse reactions/side effects.    Do not drive when taking Pain medications or sleeping medications (Benzodaizepines)   Do not take more than prescribed Pain, Sleep and Anxiety Medications. It is not advisable to combine anxiety,sleep and pain medications without talking with your primary care practitioner   Special Instructions: If you have smoked or chewed Tobacco  in the last 2 yrs please stop smoking, stop  any regular Alcohol  and or any Recreational drug use.   Wear Seat belts while driving.   Please note: You were cared for by a hospitalist during your hospital stay. Once you are discharged, your primary care physician will handle any further medical issues. Please note that NO REFILLS for any discharge medications will be authorized once you are discharged, as it is imperative that you return to your primary care physician (or establish a relationship with a primary care physician if you do not have one) for your post hospital discharge needs so that they can reassess your need for medications and monitor your lab values.  Time coordinating discharge: 35 minutes  SIGNED:  Marzetta Board, MD, PhD 01/18/2019, 8:41 AM

## 2019-01-18 NOTE — Progress Notes (Signed)
PT Cancellation Note  Patient Details Name: Beth Espinoza MRN: [id number] DOB: 03/21/1935   Cancelled Treatment:    Reason Eval/Treat Not Completed: Other (comment).  Upon PT arrival to pt's room, case manager present with pt (and pt was eating).  Will re-attempt PT treatment session at a later date/time.  Leitha Bleak, PT 01/18/19, 8:50 AM [phone]

## 2019-01-19 ENCOUNTER — Telehealth: Payer: Self-pay

## 2019-01-19 NOTE — Telephone Encounter (Signed)
Unable to reach patient for transitional care management. No answer. Will follow as appropriate.  

## 2019-01-20 ENCOUNTER — Telehealth: Payer: Self-pay

## 2019-01-20 NOTE — Telephone Encounter (Signed)
Received letter from patients family regarding recent hospital admission and care after discharge. Called and left message for daughter to call me back.

## 2019-01-20 NOTE — Telephone Encounter (Signed)
Contacted patient for transitional care management. Second attempt. Patient stated,"I cannot complete this call right now, I am on the other line with someone. I will probably schedule a hospital follow up, but not today." Agrees that I can follow up tomorrow via phone call.

## 2019-01-21 ENCOUNTER — Telehealth: Payer: PPO | Admitting: Cardiothoracic Surgery

## 2019-01-21 NOTE — Telephone Encounter (Addendum)
Transition Care Management Follow-up Telephone Call   Date discharged? 01/18/19   How have you been since you were released from the hospital? Patient states, "I was discharged with chest pain and dizziness and I still have it. It feels like a pain going all the way through my back. I quit taking the pain medication because it doesn't work." Walking around just a little to be active. Resting well with sleep aide. Denies shortness of breath, nausea, vomiting, abdominal pain and all other symptoms. Appetite is good, drinking plenty of water. BM/voiding with no problem. I asked her to consider going back to ED or urgent care for continued symptoms. Patient declined. Declines going to ED, urgent care or any other hospital to follow up for continued symptoms. States, "I don't want to go anywhere else, I just want to be left alone and if Dr.Scott wants to see me in person, my husband can bring me, otherwise I will talk to her on the virtual on Monday."   Do you understand why you were in the hospital? Patient states, "Yes, I had pain and I think it was because of the drip I was given."   Do you understand the discharge instructions? Yes, increase activity as tolerated.   Where were you discharged to? Home.   Items Reviewed:  Medications reviewed: Yes, taking scheduled home medications. No longer taking tramadol.   Allergies reviewed: Yes, none new.  Dietary changes reviewed: Yes, regular.  Referrals reviewed: Yes, HHPT. States she could not do PT due to pain.    Functional Questionnaire:   Activities of Daily Living (ADLs):   She states they are independent in the following: Toileting, bathing, dressing, self feed, grooming, ambulating.  States they require assistance with the following: Meal prep. Husband assists.    Any transportation issues/concerns?: None at this time.    Any patient concerns? See above.    Confirmed importance and date/time of follow-up visits scheduled?   Yes, appointment scheduled via doxy 01/24/19 @ 4:00, [phone].   Provider Appointment booked with Dr. Nicki Reaper, pcp.  Confirmed with patient if condition begins to worsen call PCP or go to the ER.  Patient was given the office number and encouraged to call back with question or concerns.  : Yes.

## 2019-01-21 NOTE — Telephone Encounter (Signed)
Patient say she is not going back to ER says she feels better right ow she is in no pain fo rthe first time since ER visit and she is resting. Patient stated if pain returns she will go back to ER.

## 2019-01-21 NOTE — Telephone Encounter (Signed)
It pain through to back, etc - has history of aneurysm - agree with evaluation.

## 2019-01-24 ENCOUNTER — Ambulatory Visit (INDEPENDENT_AMBULATORY_CARE_PROVIDER_SITE_OTHER): Payer: PPO | Admitting: Internal Medicine

## 2019-01-24 ENCOUNTER — Other Ambulatory Visit: Payer: Self-pay

## 2019-01-24 DIAGNOSIS — I712 Thoracic aortic aneurysm, without rupture: Secondary | ICD-10-CM

## 2019-01-24 DIAGNOSIS — F419 Anxiety disorder, unspecified: Secondary | ICD-10-CM | POA: Diagnosis not present

## 2019-01-24 DIAGNOSIS — R413 Other amnesia: Secondary | ICD-10-CM

## 2019-01-24 DIAGNOSIS — R079 Chest pain, unspecified: Secondary | ICD-10-CM | POA: Diagnosis not present

## 2019-01-24 DIAGNOSIS — M353 Polymyalgia rheumatica: Secondary | ICD-10-CM | POA: Diagnosis not present

## 2019-01-24 DIAGNOSIS — I10 Essential (primary) hypertension: Secondary | ICD-10-CM | POA: Diagnosis not present

## 2019-01-24 DIAGNOSIS — I7121 Aneurysm of the ascending aorta, without rupture: Secondary | ICD-10-CM

## 2019-01-24 NOTE — Progress Notes (Signed)
[name], female   DOB: [birth year], 83 y.o.   MRN: [id number]   Virtual Visit via video Note  This visit type was conducted due to national recommendations for restrictions regarding the COVID-19 pandemic (e.g. social distancing).  This format is felt to be most appropriate for this patient at this time.  All issues noted in this document were discussed and addressed.  No physical exam was performed (except for noted visual exam findings with Video Visits).   I connected with Beth Espinoza by a video enabled telemedicine application and verified that I am speaking with the correct person using two identifiers. Location patient: home Location provider: work  Persons participating in the virtual visit: patient, provider  I discussed the limitations, risks, security and privacy concerns of performing an evaluation and management service by telephone and the availability of in person appointments.  The patient expressed understanding and agreed to proceed.   Reason for visit:  Hospital follow up.    HPI: Pt was admitted 01/15/19 - 01/18/19 with chest pain and upper back pain.  Had reclast infusion 01/14/19.  CXR negative. It was felt that her pain was a side effect of her reclast infusion.  Was provided supportive treatment with analgesics.  Pain improved and she was discharged.  Was noted to have worsening mental status in the hospital.  MRI revealed old lacunar infarcts.  Was continued on MTX.  Since her discharge home, she has continued to improved.  She now feels back to her baseline.  Getting around her house without difficulty.  No chest pain.  No sob.  No acid reflux.  No abdominal pain.  Bowels moving.  Eating well.  Has appt planned with neurology for f/u memory.    ROS: See pertinent positives and negatives per HPI.  Past Medical History:  Diagnosis Date  . Anxiety   . Arthritis   . Cataract   . History of chicken pox   . History of kidney stones October 2013  . Hx: UTI  (urinary tract infection)   . Hyperlipidemia   . Hypertension   . Rheumatoid arthritis ([place])   . Stroke [facility])    tia    Past Surgical History:  Procedure Laterality Date  . ABDOMINAL HYSTERECTOMY    . BREAST BIOPSY Right 1995  . CATARACT EXTRACTION W/PHACO Left 04/13/2018   Procedure: CATARACT EXTRACTION PHACO AND INTRAOCULAR LENS PLACEMENT ([place]) Left Eye;  Surgeon: Birder Robson, MD;  Location: [facility] ORS;  Service: Ophthalmology;  Laterality: Left;  [place]  00:47.0 CDE 5.52 Fluid pack lot # [id number]  . EYE SURGERY    . IR ANGIO INTRA EXTRACRAN SEL COM CAROTID INNOMINATE BILAT MOD SED  07/18/2016  . IR ANGIO VERTEBRAL SEL VERTEBRAL BILAT MOD SED  07/18/2016  . PARTIAL HYSTERECTOMY  1972  . TONSILLECTOMY AND ADENOIDECTOMY  1945    Family History  Problem Relation Age of Onset  . Asthma Mother   . Diabetes Mother   . Cataracts Mother   . Heart disease Father   . Diabetes Father   . Hypertension Father   . Cancer Paternal Aunt        Liver  . Diabetes Paternal Grandmother   . Diabetes Sister   . Retinal degeneration Sister   . Glaucoma Sister     SOCIAL HX: reviewed.    Current Outpatient Medications:  .  ALPRAZolam (XANAX) 0.5 MG tablet, Take 1 tablet (0.5 mg total) by mouth daily as needed for anxiety., Disp: 30 tablet,  Rfl: 1 .  amLODipine (NORVASC) 5 MG tablet, Take 1 tablet (5 mg total) by mouth daily., Disp: 90 tablet, Rfl: 2 .  ascorbic acid (VITAMIN C) 1000 MG tablet, Take 1,000 mg by mouth daily., Disp: , Rfl:  .  atenolol (TENORMIN) 25 MG tablet, Take 1 tablet (25 mg total) by mouth daily. Must keep appt in June for additional refills, Disp: 30 tablet, Rfl: 0 .  cholecalciferol (VITAMIN D) 25 MCG tablet, Take 5 tablets (5,000 Units total) by mouth daily., Disp: 30 tablet, Rfl: 1 .  clopidogrel (PLAVIX) 75 MG tablet, Take 1 tablet (75 mg total) by mouth daily., Disp: 90 tablet, Rfl: 1 .  folic acid (FOLVITE) 1 MG tablet, Take 2 mg by mouth daily. , Disp: , Rfl:  .   ketorolac (TORADOL) 10 MG tablet, Take 1 tablet (10 mg total) by mouth every 6 (six) hours as needed., Disp: 15 tablet, Rfl: 0 .  methotrexate (RHEUMATREX) 2.5 MG tablet, Take 6 tablets per week. (Patient taking differently: Take 12.5 mg by mouth every Saturday. ), Disp: 4 tablet, Rfl: 0 .  PARoxetine (PAXIL) 20 MG tablet, Take 1 tablet (20 mg total) by mouth daily., Disp: 90 tablet, Rfl: 1 .  traMADol (ULTRAM) 50 MG tablet, Take 1 tablet (50 mg total) by mouth every 6 (six) hours as needed., Disp: 15 tablet, Rfl: 0  EXAM:  GENERAL: alert, oriented, appears well and in no acute distress  HEENT: atraumatic, conjunttiva clear, no obvious abnormalities on inspection of external nose and ears  NECK: normal movements of the head and neck  LUNGS: on inspection no signs of respiratory distress, breathing rate appears normal, no obvious gross SOB, gasping or wheezing  CV: no obvious cyanosis  PSYCH/NEURO: pleasant and cooperative, no obvious depression or anxiety, speech and thought processing grossly intact  ASSESSMENT AND PLAN:  Discussed the following assessment and plan:  Anxiety On paxil.  Stable.    Ascending aortic aneurysm ([place]) Chest CT 01/13/19 - stable.  Radiology recommended f/u in one year.    Essential hypertension Blood pressure under good control.  Continue same medication regimen.  Follow pressures.  Follow metabolic panel.    Memory change Due to f/u with neurology for further evaluation.    Polymyalgia rheumatica ([facility]) Has seen Dr Jefm Bryant.  Stable.   Chest pain Admitted with chest pain as outlined.  Felt to be side effect of reclast.  Doing well now.  No pain.  Follow.      I discussed the assessment and treatment plan with the patient. The patient was provided an opportunity to ask questions and all were answered. The patient agreed with the plan and demonstrated an understanding of the instructions.   The patient was advised to call back or seek an in-person  evaluation if the symptoms worsen or if the condition fails to improve as anticipated.   [name], MD

## 2019-01-28 ENCOUNTER — Encounter: Payer: Self-pay | Admitting: Internal Medicine

## 2019-01-28 DIAGNOSIS — R079 Chest pain, unspecified: Secondary | ICD-10-CM | POA: Insufficient documentation

## 2019-01-28 NOTE — Assessment & Plan Note (Signed)
Chest CT 01/13/19 - stable.  Radiology recommended f/u in one year.

## 2019-01-28 NOTE — Assessment & Plan Note (Signed)
Has seen Dr Jefm Bryant.  Stable.

## 2019-01-28 NOTE — Assessment & Plan Note (Signed)
On paxil.  Stable.  

## 2019-01-28 NOTE — Assessment & Plan Note (Signed)
Due to f/u with neurology for further evaluation.

## 2019-01-28 NOTE — Assessment & Plan Note (Signed)
Blood pressure under good control.  Continue same medication regimen.  Follow pressures.  Follow metabolic panel.   

## 2019-01-28 NOTE — Assessment & Plan Note (Signed)
Admitted with chest pain as outlined.  Felt to be side effect of reclast.  Doing well now.  No pain.  Follow.

## 2019-01-31 ENCOUNTER — Telehealth (INDEPENDENT_AMBULATORY_CARE_PROVIDER_SITE_OTHER): Payer: PPO | Admitting: Cardiothoracic Surgery

## 2019-01-31 ENCOUNTER — Other Ambulatory Visit: Payer: Self-pay

## 2019-01-31 DIAGNOSIS — I712 Thoracic aortic aneurysm, without rupture: Secondary | ICD-10-CM | POA: Diagnosis not present

## 2019-01-31 NOTE — Progress Notes (Signed)
Virtual Visit via Telephone Note  I connected with@ on @TODAY @ at 11:00 AM EST by telephone and verified that I am speaking with the correct person using two identifiers.   I discussed the limitations, risks, security and privacy concerns of performing an evaluation and management service by telephone and the availability of in person appointments. I also discussed with the patient that there may be a patient responsible charge related to this service. The patient expressed understanding and agreed to proceed.  This service was provided via telemedicine.  The patient consented to the visit being carried via telemedicine.  Patient's location: Home  Provider's location: Office  Referring Provider: [name]  People participating in this telemedicine visit: Patient and physician   History of Present Illness: I have been following Beth Espinoza for a 3.9 cm ascending aortic aneurysm.  She had a CT scan done last year and a repeat scan 1 year later.  The CT scan showed no change in the diameter the ascending aorta.  In the interim she was admitted to the hospital for some type of allergic reaction to a medication she was taken for her rheumatoid arthritis.  She spent 4 days in the hospital and was discharged 2 weeks ago.  She states that she is continue to improve after her hospital discharge.  She has some occasional discomfort in her chest but this has been much improved.  While she was in the hospital she did have multiple imaging studies made all of which were normal.    Observations/Objective: I did review with her the results of the CT scan which showed the ascending aortic aneurysm to measure approximately 3.9 cm   Assessment and Plan: I reviewed with the patient the options for continued surveillance.  She is agreeable to coming back in 1 year with a CT scan of the chest.     I discussed the assessment and treatment plan with the patient. The patient was provided an opportunity to ask  questions and all were answered. The patient agreed with the plan and demonstrated an understanding of the instructions.   The patient was advised to call back or seek an in-person evaluation if the symptoms worsen or if the condition fails to improve as anticipated.  I provided 15 minutes of non-face-to-face time during this encounter.   Nestor Lewandowsky, MD

## 2019-02-01 ENCOUNTER — Other Ambulatory Visit: Payer: Self-pay | Admitting: Internal Medicine

## 2019-02-03 ENCOUNTER — Other Ambulatory Visit: Payer: Self-pay | Admitting: Internal Medicine

## 2019-02-03 DIAGNOSIS — Z8673 Personal history of transient ischemic attack (TIA), and cerebral infarction without residual deficits: Secondary | ICD-10-CM | POA: Diagnosis not present

## 2019-02-03 DIAGNOSIS — E519 Thiamine deficiency, unspecified: Secondary | ICD-10-CM | POA: Diagnosis not present

## 2019-02-03 DIAGNOSIS — G3184 Mild cognitive impairment, so stated: Secondary | ICD-10-CM | POA: Diagnosis not present

## 2019-02-03 DIAGNOSIS — R413 Other amnesia: Secondary | ICD-10-CM | POA: Diagnosis not present

## 2019-02-03 DIAGNOSIS — E538 Deficiency of other specified B group vitamins: Secondary | ICD-10-CM | POA: Diagnosis not present

## 2019-02-04 NOTE — Telephone Encounter (Signed)
rx ok'd for xanax #30 with no refills.   

## 2019-02-22 DIAGNOSIS — M353 Polymyalgia rheumatica: Secondary | ICD-10-CM | POA: Diagnosis not present

## 2019-02-22 DIAGNOSIS — M1990 Unspecified osteoarthritis, unspecified site: Secondary | ICD-10-CM | POA: Diagnosis not present

## 2019-02-22 DIAGNOSIS — M810 Age-related osteoporosis without current pathological fracture: Secondary | ICD-10-CM | POA: Diagnosis not present

## 2019-03-03 ENCOUNTER — Other Ambulatory Visit: Payer: Self-pay | Admitting: Internal Medicine

## 2019-03-07 ENCOUNTER — Other Ambulatory Visit: Payer: Self-pay | Admitting: Internal Medicine

## 2019-03-09 NOTE — Telephone Encounter (Signed)
rx sent in for alprazolam #30 with one refill.

## 2019-03-14 ENCOUNTER — Ambulatory Visit (INDEPENDENT_AMBULATORY_CARE_PROVIDER_SITE_OTHER): Payer: PPO | Admitting: Nurse Practitioner

## 2019-03-14 ENCOUNTER — Other Ambulatory Visit (INDEPENDENT_AMBULATORY_CARE_PROVIDER_SITE_OTHER): Payer: Self-pay | Admitting: Nurse Practitioner

## 2019-03-14 DIAGNOSIS — I6523 Occlusion and stenosis of bilateral carotid arteries: Secondary | ICD-10-CM

## 2019-03-15 ENCOUNTER — Encounter (INDEPENDENT_AMBULATORY_CARE_PROVIDER_SITE_OTHER): Payer: Self-pay | Admitting: Nurse Practitioner

## 2019-03-15 ENCOUNTER — Other Ambulatory Visit: Payer: Self-pay

## 2019-03-15 ENCOUNTER — Ambulatory Visit (INDEPENDENT_AMBULATORY_CARE_PROVIDER_SITE_OTHER): Payer: PPO

## 2019-03-15 ENCOUNTER — Ambulatory Visit (INDEPENDENT_AMBULATORY_CARE_PROVIDER_SITE_OTHER): Payer: PPO | Admitting: Nurse Practitioner

## 2019-03-15 VITALS — BP 132/76 | HR 61 | Resp 12 | Ht 65.0 in | Wt 143.0 lb

## 2019-03-15 DIAGNOSIS — I6523 Occlusion and stenosis of bilateral carotid arteries: Secondary | ICD-10-CM

## 2019-03-15 DIAGNOSIS — I10 Essential (primary) hypertension: Secondary | ICD-10-CM

## 2019-03-15 DIAGNOSIS — E7800 Pure hypercholesterolemia, unspecified: Secondary | ICD-10-CM

## 2019-03-15 NOTE — Progress Notes (Signed)
SUBJECTIVE:  [name], female    DOB: May 30, 1935, 84 y.o.   MRN: [id number] Chief Complaint  Patient presents with  . Follow-up    Bil Carotid Stenosis    HPI  Beth Espinoza is a 84 y.o. female The patient is seen for follow up evaluation of carotid stenosis. The carotid stenosis followed by ultrasound.   The patient denies amaurosis fugax. There is no recent history of TIA symptoms or focal motor deficits. There is no prior documented CVA.  The patient is taking enteric-coated aspirin 81 mg daily.  There is no history of migraine headaches. There is no history of seizures.  The patient has a history of coronary artery disease, no recent episodes of angina or shortness of breath. The patient denies PAD or claudication symptoms. There is a history of hyperlipidemia which is being treated with a statin.    Carotid Duplex done today shows 1-39% bilaterally.  No change compared to last study in 02/04/2017  Past Medical History:  Diagnosis Date  . Anxiety   . Arthritis   . Cataract   . History of chicken pox   . History of kidney stones October 2013  . Hx: UTI (urinary tract infection)   . Hyperlipidemia   . Hypertension   . Rheumatoid arthritis ([place])   . Stroke [facility])    tia    Past Surgical History:  Procedure Laterality Date  . ABDOMINAL HYSTERECTOMY    . BREAST BIOPSY Right 1995  . CATARACT EXTRACTION W/PHACO Left 04/13/2018   Procedure: CATARACT EXTRACTION PHACO AND INTRAOCULAR LENS PLACEMENT ([place]) Left Eye;  Surgeon: Birder Robson, MD;  Location: [facility] ORS;  Service: Ophthalmology;  Laterality: Left;  [place]  00:47.0 CDE 5.52 Fluid pack lot # [id number]  . EYE SURGERY    . IR ANGIO INTRA EXTRACRAN SEL COM CAROTID INNOMINATE BILAT MOD SED  07/18/2016  . IR ANGIO VERTEBRAL SEL VERTEBRAL BILAT MOD SED  07/18/2016  . PARTIAL HYSTERECTOMY  1972  . TONSILLECTOMY AND ADENOIDECTOMY  1945    Social History   Socioeconomic History  . Marital status: Married    Spouse  name: Not on file  . Number of children: Not on file  . Years of education: Not on file  . Highest education level: Not on file  Occupational History  . Not on file  Tobacco Use  . Smoking status: Never Smoker  . Smokeless tobacco: Never Used  Substance and Sexual Activity  . Alcohol use: No    Alcohol/week: 0.0 standard drinks  . Drug use: No  . Sexual activity: Not on file  Other Topics Concern  . Not on file  Social History Narrative  . Not on file   Social Determinants of Health   Financial Resource Strain:   . Difficulty of Paying Living Expenses: Not on file  Food Insecurity:   . Worried About Charity fundraiser in the Last Year: Not on file  . Ran Out of Food in the Last Year: Not on file  Transportation Needs:   . Lack of Transportation (Medical): Not on file  . Lack of Transportation (Non-Medical): Not on file  Physical Activity:   . Days of Exercise per Week: Not on file  . Minutes of Exercise per Session: Not on file  Stress:   . Feeling of Stress : Not on file  Social Connections:   . Frequency of Communication with Friends and Family: Not on file  . Frequency of Social Gatherings with  Friends and Family: Not on file  . Attends Religious Services: Not on file  . Active Member of Clubs or Organizations: Not on file  . Attends Archivist Meetings: Not on file  . Marital Status: Not on file  Intimate Partner Violence:   . Fear of Current or Ex-Partner: Not on file  . Emotionally Abused: Not on file  . Physically Abused: Not on file  . Sexually Abused: Not on file    Family History  Problem Relation Age of Onset  . Asthma Mother   . Diabetes Mother   . Cataracts Mother   . Heart disease Father   . Diabetes Father   . Hypertension Father   . Cancer Paternal Aunt        Liver  . Diabetes Paternal Grandmother   . Diabetes Sister   . Retinal degeneration Sister   . Glaucoma Sister     Allergies  Allergen Reactions  . Pneumovax  [Pneumococcal Polysaccharide Vaccine] Swelling  . Pneumococcal Vaccine Swelling  . Reclast [Zoledronic Acid]      Review of Systems   Review of Systems: Negative Unless Checked Constitutional: [] Weight loss  [] Fever  [] Chills Cardiac: [] Chest pain   []  Atrial Fibrillation  [] Palpitations   [] Shortness of breath when laying flat   [] Shortness of breath with exertion. [] Shortness of breath at rest Vascular:  [] Pain in legs with walking   [] Pain in legs with standing [] Pain in legs when laying flat   [] Claudication    [] Pain in feet when laying flat    [] History of DVT   [] Phlebitis   [] Swelling in legs   [] Varicose veins   [] Non-healing ulcers Pulmonary:   [] Uses home oxygen   [] Productive cough   [] Hemoptysis   [] Wheeze  [] COPD   [] Asthma Neurologic:  [] Dizziness   [] Seizures  [] Blackouts [] History of stroke   [x] History of TIA  [] Aphasia   [] Temporary Blindness   [] Weakness or numbness in arm   [] Weakness or numbness in leg Musculoskeletal:   [] Joint swelling   [] Joint pain   [] Low back pain  []  History of Knee Replacement [] Arthritis [] back Surgeries  []  Spinal Stenosis    Hematologic:  [] Easy bruising  [] Easy bleeding   [] Hypercoagulable state   [] Anemic Gastrointestinal:  [] Diarrhea   [] Vomiting  [] Gastroesophageal reflux/heartburn   [] Difficulty swallowing. [] Abdominal pain Genitourinary:  [] Chronic kidney disease   [] Difficult urination  [] Anuric   [] Blood in urine [] Frequent urination  [] Burning with urination   [] Hematuria Skin:  [] Rashes   [] Ulcers [] Wounds Psychological:  [x] History of anxiety   []  History of major depression  []  Memory Difficulties      OBJECTIVE:   Physical Exam  BP 132/76 (BP Location: Right Arm)   Pulse 61   Resp 12   Ht 5\' 5"  (1.651 m)   Wt 143 lb (64.9 kg)   BMI 23.80 kg/m   Gen: WD/WN, NAD Head: /AT, No temporalis wasting.  Ear/Nose/Throat: Hearing grossly intact, nares w/o erythema or drainage Eyes: PER, EOMI, sclera nonicteric.  Neck: Supple,  no masses.  No JVD.  Pulmonary:  Good air movement, no use of accessory muscles.  Cardiac: RRR Vascular:  Vessel Right Left  Radial Palpable Palpable  Gastrointestinal: soft, non-distended. No guarding/no peritoneal signs.  Musculoskeletal: M/S 5/5 throughout.  No deformity or atrophy.  Neurologic: Pain and light touch intact in extremities.  Symmetrical.  Speech is fluent. Motor exam as listed above. Psychiatric: Judgment intact, Mood & affect appropriate for pt's clinical  situation.        ASSESSMENT AND PLAN:  1. Bilateral carotid artery stenosis Recommend:  Given the patient's asymptomatic subcritical stenosis no further invasive testing or surgery at this time.  Duplex ultrasound shows 1-39% stenosis bilaterally.  Continue antiplatelet therapy as prescribed Continue management of CAD, HTN and Hyperlipidemia Healthy heart diet,  encouraged exercise at least 4 times per week Follow up in 24 months with duplex ultrasound and physical exam   2. Essential hypertension Good BP today, on appropriate medications, no changes made.   3. Hypercholesterolemia Good lipid control is needed for control atherosclerotic disease. Patient on appropriate medication.    Current Outpatient Medications on File Prior to Visit  Medication Sig Dispense Refill  . ALPRAZolam (XANAX) 0.5 MG tablet Take 1 tablet (0.5 mg total) by mouth daily as needed for anxiety. 30 tablet 1  . amLODipine (NORVASC) 5 MG tablet Take 1 tablet (5 mg total) by mouth daily. 90 tablet 2  . ascorbic acid (VITAMIN C) 1000 MG tablet Take 1,000 mg by mouth daily.    Marland Kitchen atenolol (TENORMIN) 25 MG tablet Take 1 tablet (25 mg total) by mouth daily. Must keep appt in June for additional refills 30 tablet 5  . cholecalciferol (VITAMIN D) 25 MCG tablet Take 5 tablets (5,000 Units total) by mouth daily. 30 tablet 1  . clopidogrel (PLAVIX) 75 MG tablet Take 1 tablet (75 mg total) by mouth daily. 90 tablet 1  . folic acid (FOLVITE) 1  MG tablet Take 2 mg by mouth daily.     . methotrexate (RHEUMATREX) 2.5 MG tablet Take 6 tablets per week. (Patient taking differently: Take 12.5 mg by mouth every Saturday. ) 4 tablet 0  . PARoxetine (PAXIL) 20 MG tablet Take 1 tablet (20 mg total) by mouth daily. 90 tablet 1  . traMADol (ULTRAM) 50 MG tablet Take 1 tablet (50 mg total) by mouth every 6 (six) hours as needed. 15 tablet 0  . ketorolac (TORADOL) 10 MG tablet Take 1 tablet (10 mg total) by mouth every 6 (six) hours as needed. (Patient not taking: Reported on 03/15/2019) 15 tablet 0   No current facility-administered medications on file prior to visit.    There are no Patient Instructions on file for this visit. No follow-ups on file.   Kris Hartmann, NP  This note was completed with Sales executive.  Any errors are purely unintentional.

## 2019-04-14 ENCOUNTER — Other Ambulatory Visit: Payer: Self-pay

## 2019-04-14 ENCOUNTER — Ambulatory Visit (INDEPENDENT_AMBULATORY_CARE_PROVIDER_SITE_OTHER): Payer: PPO | Admitting: Internal Medicine

## 2019-04-14 DIAGNOSIS — I6523 Occlusion and stenosis of bilateral carotid arteries: Secondary | ICD-10-CM

## 2019-04-14 DIAGNOSIS — I10 Essential (primary) hypertension: Secondary | ICD-10-CM

## 2019-04-14 DIAGNOSIS — M810 Age-related osteoporosis without current pathological fracture: Secondary | ICD-10-CM | POA: Diagnosis not present

## 2019-04-14 DIAGNOSIS — I712 Thoracic aortic aneurysm, without rupture: Secondary | ICD-10-CM

## 2019-04-14 DIAGNOSIS — M353 Polymyalgia rheumatica: Secondary | ICD-10-CM | POA: Diagnosis not present

## 2019-04-14 DIAGNOSIS — I7121 Aneurysm of the ascending aorta, without rupture: Secondary | ICD-10-CM

## 2019-04-14 DIAGNOSIS — F419 Anxiety disorder, unspecified: Secondary | ICD-10-CM

## 2019-04-14 DIAGNOSIS — R413 Other amnesia: Secondary | ICD-10-CM

## 2019-04-14 DIAGNOSIS — E7800 Pure hypercholesterolemia, unspecified: Secondary | ICD-10-CM | POA: Diagnosis not present

## 2019-04-14 NOTE — Progress Notes (Addendum)
[name], female   DOB: 1936/03/02, 84 y.o.   MRN: [id number]   Virtual Visit via video Note  This visit type was conducted due to national recommendations for restrictions regarding the COVID-19 pandemic (e.g. social distancing).  This format is felt to be most appropriate for this patient at this time.  All issues noted in this document were discussed and addressed.  No physical exam was performed (except for noted visual exam findings with Video Visits).   I connected with Alejandro Mccallum today by a video enabled telemedicine application and verified that I am speaking with the correct person using two identifiers. Location patient: home Location provider: work  Persons participating in the virtual visit: patient, provider  The limitations, risks, security and privacy concerns of performing an evaluation and management service by video and the availability of in person appointments have been discussed. The patient expressed understanding and agreed to proceed.   Reason for visit: scheduled follow up.   HPI: She reports she feels good.  Stays active.  No chest pain.  No sob.  No acid reflux.  No problems swallowing.  No abdominal pain.  Bowels moving.  No increased heart rate or palpitations.  Back on MTX.  Joints stable.  Saw Dr Manuella Ghazi recently for evaluation of memory.  Doing well.  Previous concern regarding reaction to reclast.  See last note.  Handling stress.       ROS: See pertinent positives and negatives per HPI.  Past Medical History:  Diagnosis Date  . Anxiety   . Arthritis   . Cataract   . History of chicken pox   . History of kidney stones October 2013  . Hx: UTI (urinary tract infection)   . Hyperlipidemia   . Hypertension   . Rheumatoid arthritis ([place])   . Stroke [facility])    tia    Past Surgical History:  Procedure Laterality Date  . ABDOMINAL HYSTERECTOMY    . BREAST BIOPSY Right 1995  . CATARACT EXTRACTION W/PHACO Left 04/13/2018   Procedure: CATARACT  EXTRACTION PHACO AND INTRAOCULAR LENS PLACEMENT ([place]) Left Eye;  Surgeon: Birder Robson, MD;  Location: [facility] ORS;  Service: Ophthalmology;  Laterality: Left;  [place]  00:47.0 CDE 5.52 Fluid pack lot # [id number]  . EYE SURGERY    . IR ANGIO INTRA EXTRACRAN SEL COM CAROTID INNOMINATE BILAT MOD SED  07/18/2016  . IR ANGIO VERTEBRAL SEL VERTEBRAL BILAT MOD SED  07/18/2016  . PARTIAL HYSTERECTOMY  1972  . TONSILLECTOMY AND ADENOIDECTOMY  1945    Family History  Problem Relation Age of Onset  . Asthma Mother   . Diabetes Mother   . Cataracts Mother   . Heart disease Father   . Diabetes Father   . Hypertension Father   . Cancer Paternal Aunt        Liver  . Diabetes Paternal Grandmother   . Diabetes Sister   . Retinal degeneration Sister   . Glaucoma Sister     SOCIAL HX: reviewed    Current Outpatient Medications:  .  ALPRAZolam (XANAX) 0.5 MG tablet, Take 1 tablet (0.5 mg total) by mouth daily as needed for anxiety., Disp: 30 tablet, Rfl: 1 .  amLODipine (NORVASC) 5 MG tablet, Take 1 tablet (5 mg total) by mouth daily., Disp: 90 tablet, Rfl: 2 .  ascorbic acid (VITAMIN C) 1000 MG tablet, Take 1,000 mg by mouth daily., Disp: , Rfl:  .  atenolol (TENORMIN) 25 MG tablet, Take 1 tablet (25 mg  total) by mouth daily. Must keep appt in June for additional refills, Disp: 30 tablet, Rfl: 5 .  cholecalciferol (VITAMIN D) 25 MCG tablet, Take 5 tablets (5,000 Units total) by mouth daily., Disp: 30 tablet, Rfl: 1 .  clopidogrel (PLAVIX) 75 MG tablet, Take 1 tablet (75 mg total) by mouth daily., Disp: 90 tablet, Rfl: 1 .  folic acid (FOLVITE) 1 MG tablet, Take 2 mg by mouth daily. , Disp: , Rfl:  .  ketorolac (TORADOL) 10 MG tablet, Take 1 tablet (10 mg total) by mouth every 6 (six) hours as needed. (Patient not taking: Reported on 03/15/2019), Disp: 15 tablet, Rfl: 0 .  methotrexate (RHEUMATREX) 2.5 MG tablet, Take 6 tablets per week. (Patient taking differently: Take 12.5 mg by mouth every Saturday.  ), Disp: 4 tablet, Rfl: 0 .  PARoxetine (PAXIL) 20 MG tablet, Take 1 tablet (20 mg total) by mouth daily., Disp: 90 tablet, Rfl: 1 .  traMADol (ULTRAM) 50 MG tablet, Take 1 tablet (50 mg total) by mouth every 6 (six) hours as needed., Disp: 15 tablet, Rfl: 0  EXAM:  GENERAL: alert, oriented, appears well and in no acute distress  HEENT: atraumatic, conjunttiva clear, no obvious abnormalities on inspection of external nose and ears  NECK: normal movements of the head and neck  LUNGS: on inspection no signs of respiratory distress, breathing rate appears normal, no obvious gross SOB, gasping or wheezing  CV: no obvious cyanosis  PSYCH/NEURO: pleasant and cooperative, no obvious depression or anxiety, speech and thought processing grossly intact  ASSESSMENT AND PLAN:  Discussed the following assessment and plan:  Anxiety On paxil.  Stable.    Ascending aortic aneurysm ([place]) Chest CT 01/13/19 - stable.  Radiology recommended f/u in one year.    Carotid stenosis Evaluated by [facility] 03/2019.  Recommended f/u in 24 months.  Continue plavix.    Essential hypertension Blood pressure under good control.  Continue same medication regimen.  Follow pressures.  Follow metabolic panel.    Hypercholesterolemia Had intolerance to crestor and zetia. Saw Dr Gabriel Carina - for repatha.     Memory change Saw neurology.  SLUMS - 26/30.  Follow.    Osteoporosis Previous concern regarding reaction to reclast.  Calcium, vitamin D and weight bearing exercise.  Follow.      Polymyalgia rheumatica ([place]) Followed by rheumatology.  On MTX.  Stable.     I discussed the assessment and treatment plan with the patient. The patient was provided an opportunity to ask questions and all were answered. The patient agreed with the plan and demonstrated an understanding of the instructions.   The patient was advised to call back or seek an in-person evaluation if the symptoms worsen or if the condition fails to  improve as anticipated.   [name], MD

## 2019-04-17 ENCOUNTER — Encounter: Payer: Self-pay | Admitting: Internal Medicine

## 2019-04-17 NOTE — Assessment & Plan Note (Signed)
Followed by rheumatology.  On MTX.  Stable.   

## 2019-04-17 NOTE — Assessment & Plan Note (Addendum)
Previous concern regarding reaction to reclast.  Calcium, vitamin D and weight bearing exercise.  Follow.

## 2019-04-17 NOTE — Assessment & Plan Note (Signed)
Saw neurology.  SLUMS - 26/30.  Follow.

## 2019-04-17 NOTE — Assessment & Plan Note (Addendum)
Had intolerance to crestor and zetia. Saw Dr Gabriel Carina - for repatha.

## 2019-04-17 NOTE — Assessment & Plan Note (Addendum)
Evaluated by [facility] 03/2019.  Recommended f/u in 24 months.  Continue plavix.

## 2019-04-17 NOTE — Assessment & Plan Note (Signed)
Blood pressure under good control.  Continue same medication regimen.  Follow pressures.  Follow metabolic panel.   

## 2019-04-17 NOTE — Assessment & Plan Note (Signed)
Chest CT 01/13/19 - stable.  Radiology recommended f/u in one year.

## 2019-04-17 NOTE — Assessment & Plan Note (Signed)
On paxil.  Stable.  

## 2019-04-29 ENCOUNTER — Other Ambulatory Visit: Payer: Self-pay | Admitting: Internal Medicine

## 2019-05-02 ENCOUNTER — Other Ambulatory Visit: Payer: Self-pay | Admitting: Internal Medicine

## 2019-05-02 NOTE — Telephone Encounter (Signed)
Refill request for xanax, last seen 04-17-19, last filled 03-09-2019.  Please advise.

## 2019-05-03 ENCOUNTER — Other Ambulatory Visit: Payer: Self-pay | Admitting: Internal Medicine

## 2019-05-03 NOTE — Telephone Encounter (Signed)
rx sent in for xanax #30 with one refill.

## 2019-05-03 NOTE — Telephone Encounter (Signed)
Refill request for xanax, last seen 04-17-19.  Please advise.

## 2019-05-04 ENCOUNTER — Other Ambulatory Visit: Payer: Self-pay | Admitting: Internal Medicine

## 2019-05-04 NOTE — Telephone Encounter (Signed)
Refill already sent into pharmacy

## 2019-05-09 ENCOUNTER — Other Ambulatory Visit: Payer: Self-pay | Admitting: Internal Medicine

## 2019-05-12 ENCOUNTER — Other Ambulatory Visit: Payer: Self-pay

## 2019-05-12 ENCOUNTER — Other Ambulatory Visit (INDEPENDENT_AMBULATORY_CARE_PROVIDER_SITE_OTHER): Payer: PPO

## 2019-05-12 DIAGNOSIS — E7800 Pure hypercholesterolemia, unspecified: Secondary | ICD-10-CM

## 2019-05-12 DIAGNOSIS — I10 Essential (primary) hypertension: Secondary | ICD-10-CM | POA: Diagnosis not present

## 2019-05-12 LAB — HEPATIC FUNCTION PANEL
ALT: 18 U/L (ref 0–35)
AST: 21 U/L (ref 0–37)
Albumin: 4 g/dL (ref 3.5–5.2)
Alkaline Phosphatase: 45 U/L (ref 39–117)
Bilirubin, Direct: 0.1 mg/dL (ref 0.0–0.3)
Total Bilirubin: 0.5 mg/dL (ref 0.2–1.2)
Total Protein: 6.9 g/dL (ref 6.0–8.3)

## 2019-05-12 LAB — LIPID PANEL
Cholesterol: 257 mg/dL — ABNORMAL HIGH (ref 0–200)
HDL: 42.1 mg/dL (ref >39.00)
NonHDL: 215.2
Total CHOL/HDL Ratio: 6
Triglycerides: 210 mg/dL — ABNORMAL HIGH (ref 0.0–149.0)
VLDL: 42 mg/dL — ABNORMAL HIGH (ref 0.0–40.0)

## 2019-05-12 LAB — BASIC METABOLIC PANEL
BUN: 13 mg/dL (ref 6–23)
CO2: 26 mEq/L (ref 19–32)
Calcium: 9 mg/dL (ref 8.4–10.5)
Chloride: 107 mEq/L (ref 96–112)
Creatinine, Ser: 0.87 mg/dL (ref 0.40–1.20)
GFR: 62.38 mL/min (ref >60.00)
Glucose, Bld: 101 mg/dL — ABNORMAL HIGH (ref 70–99)
Potassium: 4.3 mEq/L (ref 3.5–5.1)
Sodium: 140 mEq/L (ref 135–145)

## 2019-05-12 LAB — LDL CHOLESTEROL, DIRECT: Direct LDL: 168 mg/dL

## 2019-05-17 ENCOUNTER — Other Ambulatory Visit: Payer: Self-pay | Admitting: Internal Medicine

## 2019-05-20 NOTE — Telephone Encounter (Signed)
Patient does not need. Was filled 2 weeks ago. Sending to you to refuse medication.

## 2019-05-20 NOTE — Telephone Encounter (Signed)
See note

## 2019-05-24 ENCOUNTER — Other Ambulatory Visit: Payer: Self-pay | Admitting: Internal Medicine

## 2019-05-25 ENCOUNTER — Telehealth: Payer: Self-pay | Admitting: Internal Medicine

## 2019-05-25 ENCOUNTER — Encounter: Payer: Self-pay | Admitting: Internal Medicine

## 2019-05-25 NOTE — Telephone Encounter (Signed)
Patient is out of ALPRAZolam Duanne Moron) 0.5 MG tablet, prescription that was sent over on 05/03/2019 failed to transmit. Please sent over.

## 2019-05-25 NOTE — Telephone Encounter (Signed)
Last OV 04/14/19 Next OV 08/11/19 Last refill 03/09/19  Per chart and pharmacy, prescription that shows to be filled on 05/03/19 is reading- transmission to pharmacy failed. Did not come through for them to fill. Pt is out of medication.

## 2019-05-25 NOTE — Telephone Encounter (Signed)
See other phone note sent to Dr Nicki Reaper

## 2019-05-26 NOTE — Telephone Encounter (Signed)
Per note, pharmacy did not receive previous rx.  Last refill per PDMP review - 04/05/19.  rx sent in to pharmacy.

## 2019-06-22 ENCOUNTER — Other Ambulatory Visit: Payer: Self-pay | Admitting: Internal Medicine

## 2019-06-22 NOTE — Telephone Encounter (Signed)
rx sent in for xanax #30 with one refill.

## 2019-06-22 NOTE — Telephone Encounter (Signed)
Refill request for xanax, last seen 04-14-19, last filled 05-26-19.  Please advise.

## 2019-07-05 ENCOUNTER — Other Ambulatory Visit: Payer: Self-pay | Admitting: Internal Medicine

## 2019-08-11 ENCOUNTER — Encounter: Payer: Self-pay | Admitting: Internal Medicine

## 2019-08-11 ENCOUNTER — Ambulatory Visit (INDEPENDENT_AMBULATORY_CARE_PROVIDER_SITE_OTHER): Payer: PPO | Admitting: Internal Medicine

## 2019-08-11 ENCOUNTER — Other Ambulatory Visit: Payer: Self-pay

## 2019-08-11 VITALS — BP 122/78 | HR 65 | Temp 97.2°F | Ht 65.0 in | Wt 140.0 lb

## 2019-08-11 DIAGNOSIS — M353 Polymyalgia rheumatica: Secondary | ICD-10-CM

## 2019-08-11 DIAGNOSIS — M810 Age-related osteoporosis without current pathological fracture: Secondary | ICD-10-CM

## 2019-08-11 DIAGNOSIS — E7800 Pure hypercholesterolemia, unspecified: Secondary | ICD-10-CM | POA: Diagnosis not present

## 2019-08-11 DIAGNOSIS — Z0000 Encounter for general adult medical examination without abnormal findings: Secondary | ICD-10-CM | POA: Diagnosis not present

## 2019-08-11 DIAGNOSIS — F419 Anxiety disorder, unspecified: Secondary | ICD-10-CM

## 2019-08-11 DIAGNOSIS — I10 Essential (primary) hypertension: Secondary | ICD-10-CM | POA: Diagnosis not present

## 2019-08-11 DIAGNOSIS — I7121 Aneurysm of the ascending aorta, without rupture: Secondary | ICD-10-CM

## 2019-08-11 DIAGNOSIS — I712 Thoracic aortic aneurysm, without rupture: Secondary | ICD-10-CM | POA: Diagnosis not present

## 2019-08-11 DIAGNOSIS — I77810 Thoracic aortic ectasia: Secondary | ICD-10-CM

## 2019-08-11 NOTE — Progress Notes (Signed)
[name], female   DOB: 1935-12-24, 84 y.o.   MRN: [id number]   Subjective:    [name], female    DOB: [birth year], 84 y.o.   MRN: [id number]  HPI This visit occurred during the SARS-CoV-2 public health emergency.  Safety protocols were in place, including screening questions prior to the visit, additional usage of staff PPE, and extensive cleaning of exam room while observing appropriate contact time as indicated for disinfecting solutions.  Patient here for her physical exam.  She reports she is doing well.  Stays active.  No chest pain or sob reported.  No abdominal pain or bowel change reported.  No increased cough or congestion.  Discussed cholesterol treatment.  She saw endocrinology.  Was getting PA for Repatha.    Past Medical History:  Diagnosis Date   Anxiety    Arthritis    Cataract    History of chicken pox    History of kidney stones October 2013   Hx: UTI (urinary tract infection)    Hyperlipidemia    Hypertension    Rheumatoid arthritis ([place])    Stroke ([place])    tia   Past Surgical History:  Procedure Laterality Date   ABDOMINAL HYSTERECTOMY     BREAST BIOPSY Right 1995   CATARACT EXTRACTION W/PHACO Left 04/13/2018   Procedure: CATARACT EXTRACTION PHACO AND INTRAOCULAR LENS PLACEMENT ([place]) Left Eye;  Surgeon: Birder Robson, MD;  Location: [facility] ORS;  Service: Ophthalmology;  Laterality: Left;  [place]  00:47.0 CDE 5.52 Fluid pack lot # [id number]   EYE SURGERY     IR ANGIO INTRA EXTRACRAN SEL COM CAROTID INNOMINATE BILAT MOD SED  07/18/2016   IR ANGIO VERTEBRAL SEL VERTEBRAL BILAT MOD SED  07/18/2016   PARTIAL HYSTERECTOMY  1972   TONSILLECTOMY AND ADENOIDECTOMY  1945   Family History  Problem Relation Age of Onset   Asthma Mother    Diabetes Mother    Cataracts Mother    Heart disease Father    Diabetes Father    Hypertension Father    Cancer Paternal Aunt        Liver   Diabetes Paternal Grandmother    Diabetes  Sister    Retinal degeneration Sister    Glaucoma Sister    Social History   Socioeconomic History   Marital status: Married    Spouse name: Not on file   Number of children: Not on file   Years of education: Not on file   Highest education level: Not on file  Occupational History   Not on file  Tobacco Use   Smoking status: Never Smoker   Smokeless tobacco: Never Used  Vaping Use   Vaping Use: Unknown  Substance and Sexual Activity   Alcohol use: No    Alcohol/week: 0.0 standard drinks   Drug use: No   Sexual activity: Not on file  Other Topics Concern   Not on file  Social History Narrative   Not on file   Social Determinants of Health   Financial Resource Strain:    Difficulty of Paying Living Expenses:   Food Insecurity:    Worried About Charity fundraiser in the Last Year:    Arboriculturist in the Last Year:   Transportation Needs:    Film/video editor (Medical):    Lack of Transportation (Non-Medical):   Physical Activity:    Days of Exercise per Week:    Minutes of Exercise per Session:  Stress:    Feeling of Stress :   Social Connections:    Frequency of Communication with Friends and Family:    Frequency of Social Gatherings with Friends and Family:    Attends Religious Services:    Active Member of Clubs or Organizations:    Attends Archivist Meetings:    Marital Status:     Outpatient Encounter Medications as of 08/11/2019  Medication Sig   ALPRAZolam (XANAX) 0.5 MG tablet Take 1 tablet (0.5 mg total) by mouth daily as needed for anxiety.   amLODipine (NORVASC) 5 MG tablet Take 1 tablet (5 mg total) by mouth daily.   ascorbic acid (VITAMIN C) 1000 MG tablet Take 1,000 mg by mouth daily.   atenolol (TENORMIN) 25 MG tablet Take 1 tablet (25 mg total) by mouth daily. Must keep appt in June for additional refills   cholecalciferol (VITAMIN D) 25 MCG tablet Take 5 tablets (5,000 Units total) by mouth  daily.   clopidogrel (PLAVIX) 75 MG tablet Take 1 tablet (75 mg total) by mouth daily.   folic acid (FOLVITE) 1 MG tablet Take 2 mg by mouth daily.    methotrexate (RHEUMATREX) 2.5 MG tablet Take 6 tablets per week. (Patient taking differently: Take 12.5 mg by mouth every Saturday. )   PARoxetine (PAXIL) 20 MG tablet Take 1 tablet (20 mg total) by mouth daily.   ketorolac (TORADOL) 10 MG tablet Take 1 tablet (10 mg total) by mouth every 6 (six) hours as needed. (Patient not taking: Reported on 08/11/2019)   traMADol (ULTRAM) 50 MG tablet Take 1 tablet (50 mg total) by mouth every 6 (six) hours as needed. (Patient not taking: Reported on 08/11/2019)   No facility-administered encounter medications on file as of 08/11/2019.    Review of Systems  Constitutional: Negative for appetite change and unexpected weight change.  HENT: Negative for congestion and sinus pressure.   Eyes: Negative for pain and visual disturbance.  Respiratory: Negative for cough, chest tightness and shortness of breath.   Cardiovascular: Negative for chest pain, palpitations and leg swelling.  Gastrointestinal: Negative for abdominal pain, diarrhea, nausea and vomiting.  Genitourinary: Negative for difficulty urinating and dysuria.  Musculoskeletal: Negative for joint swelling and myalgias.  Skin: Negative for color change and rash.  Neurological: Negative for dizziness, light-headedness and headaches.  Hematological: Negative for adenopathy. Does not bruise/bleed easily.  Psychiatric/Behavioral: Negative for agitation and dysphoric mood.       Objective:    Physical Exam Vitals reviewed.  Constitutional:      General: She is not in acute distress.    Appearance: Normal appearance. She is well-developed.  HENT:     Head: Normocephalic and atraumatic.     Right Ear: External ear normal.     Left Ear: External ear normal.  Eyes:     General: No scleral icterus.       Right eye: No discharge.        Left  eye: No discharge.     Conjunctiva/sclera: Conjunctivae normal.  Neck:     Thyroid: No thyromegaly.  Cardiovascular:     Rate and Rhythm: Normal rate and regular rhythm.  Pulmonary:     Effort: No tachypnea, accessory muscle usage or respiratory distress.     Breath sounds: Normal breath sounds. No decreased breath sounds or wheezing.  Chest:     Breasts:        Right: No inverted nipple, mass, nipple discharge or tenderness (no axillary adenopathy).  Left: No inverted nipple, mass, nipple discharge or tenderness (no axilarry adenopathy).  Abdominal:     General: Bowel sounds are normal.     Palpations: Abdomen is soft.     Tenderness: There is no abdominal tenderness.  Musculoskeletal:        General: No swelling or tenderness.     Cervical back: Neck supple. No tenderness.  Lymphadenopathy:     Cervical: No cervical adenopathy.  Skin:    Findings: No erythema or rash.  Neurological:     Mental Status: She is alert and oriented to person, place, and time.  Psychiatric:        Mood and Affect: Mood normal.        Behavior: Behavior normal.     BP 122/78 (BP Location: Left Arm, Patient Position: Sitting, Cuff Size: Normal)    Pulse 65    Temp (!) 97.2 F (36.2 C) (Skin)    Ht 5\' 5"  (1.651 m)    Wt 140 lb (63.5 kg)    SpO2 96%    BMI 23.30 kg/m  Wt Readings from Last 3 Encounters:  08/11/19 140 lb (63.5 kg)  03/15/19 143 lb (64.9 kg)  01/15/19 140 lb (63.5 kg)     Lab Results  Component Value Date   WBC 8.2 01/16/2019   HGB 12.6 01/16/2019   HCT 38.5 01/16/2019   PLT 230 01/16/2019   GLUCOSE 101 (H) 05/12/2019   CHOL 257 (H) 05/12/2019   TRIG 210.0 (H) 05/12/2019   HDL 42.10 05/12/2019   LDLDIRECT 168.0 05/12/2019   LDLCALC 218 (H) 08/12/2018   ALT 18 05/12/2019   AST 21 05/12/2019   NA 140 05/12/2019   K 4.3 05/12/2019   CL 107 05/12/2019   CREATININE 0.87 05/12/2019   BUN 13 05/12/2019   CO2 26 05/12/2019   TSH 3.653 01/16/2019   INR 1.07  07/18/2016    DG Chest 2 View  Result Date: 01/15/2019 CLINICAL DATA:  Chest pain EXAM: CHEST - 2 VIEW COMPARISON:  01/15/2019, CT 01/13/2019 FINDINGS: No focal opacity or pleural effusion. Stable cardiomediastinal silhouette with aortic atherosclerosis. No pneumothorax. IMPRESSION: No active cardiopulmonary disease. Electronically Signed   By: Donavan Foil M.D.   On: 01/15/2019 22:46   DG Chest Portable 1 View  Result Date: 01/15/2019 CLINICAL DATA:  Chest pain. EXAM: PORTABLE CHEST 1 VIEW COMPARISON:  01/13/2019 FINDINGS: The heart size and mediastinal contours are within normal limits. The lungs are hyperinflated but clear. The visualized skeletal structures are unremarkable. IMPRESSION: No active cardiopulmonary abnormalities. Electronically Signed   By: Kerby Moors M.D.   On: 01/15/2019 09:31       Assessment & Plan:   Problem List Items Addressed This Visit    Anxiety    On paxil.  Stable.  Takes xanax at night.  Discussed. Feels needs to continue to take.  Desires not to decrease dose.        Ascending aortic aneurysm (HCC)    Saw Dr Genevive Bi 01/31/19.  Stable.  F/u one year.        Ectatic thoracic aorta ([place])    Has been evaluated by Dr Genevive Bi.  CT scan - 01/2019.  Recommended f/u CTA/MRA in one year.  Continue f/u with Dr Genevive Bi.        Essential hypertension    Blood pressure as outlined.  On amlodipine.  Follow pressures.  Follow metabolic panel.       Health care maintenance    Physical today  08/11/19.  Has declined mammogram and colonoscopy.        Hypercholesterolemia    Had intolerance to crestor and zetia.  Low cholesterol diet and exercise.  Saw endocrinology.  Trying to get PA for repatha.  F/u with endocrinology.  Follow lipid panel and liver function tests.        Osteoporosis    Had reaction to reclast.  Calcium, vitamin D and weight bearing exercise.        Polymyalgia rheumatica ([place])    Followed by rheumatology.  On MTX.  Stable.         Other  Visit Diagnoses    Routine general medical examination at a health care facility    -  Primary       [name], MD

## 2019-08-17 ENCOUNTER — Encounter: Payer: Self-pay | Admitting: Internal Medicine

## 2019-08-17 ENCOUNTER — Other Ambulatory Visit: Payer: Self-pay | Admitting: Internal Medicine

## 2019-08-17 NOTE — Assessment & Plan Note (Signed)
Followed by rheumatology.  On MTX.  Stable.

## 2019-08-17 NOTE — Assessment & Plan Note (Signed)
Has been evaluated by Dr Genevive Bi.  CT scan - 01/2019.  Recommended f/u CTA/MRA in one year.  Continue f/u with Dr Genevive Bi.

## 2019-08-17 NOTE — Assessment & Plan Note (Signed)
Had reaction to reclast.  Calcium, vitamin D and weight bearing exercise.  

## 2019-08-17 NOTE — Assessment & Plan Note (Signed)
Blood pressure as outlined.  On amlodipine.  Follow pressures.  Follow metabolic panel.  

## 2019-08-17 NOTE — Assessment & Plan Note (Addendum)
Had intolerance to crestor and zetia.  Low cholesterol diet and exercise.  Saw endocrinology.  Trying to get PA for repatha.  F/u with endocrinology.  Follow lipid panel and liver function tests.

## 2019-08-17 NOTE — Assessment & Plan Note (Signed)
Physical today 08/11/19.  Has declined mammogram and colonoscopy.

## 2019-08-17 NOTE — Assessment & Plan Note (Signed)
Saw Dr Genevive Bi 01/31/19.  Stable.  F/u one year.

## 2019-08-17 NOTE — Assessment & Plan Note (Signed)
On paxil.  Stable.  Takes xanax at night.  Discussed. Feels needs to continue to take.  Desires not to decrease dose.

## 2019-08-18 NOTE — Telephone Encounter (Signed)
Refill request for Xanax, last seen 08-11-19, last filled 06-22-19.  Please advise.

## 2019-08-19 ENCOUNTER — Other Ambulatory Visit: Payer: Self-pay | Admitting: Internal Medicine

## 2019-08-21 ENCOUNTER — Other Ambulatory Visit: Payer: Self-pay | Admitting: Internal Medicine

## 2019-08-23 NOTE — Telephone Encounter (Signed)
rx ok'd for xanax #30 with one refill (for July and August)

## 2019-08-29 ENCOUNTER — Other Ambulatory Visit: Payer: Self-pay | Admitting: Internal Medicine

## 2019-08-30 DIAGNOSIS — M1990 Unspecified osteoarthritis, unspecified site: Secondary | ICD-10-CM | POA: Diagnosis not present

## 2019-08-30 DIAGNOSIS — M353 Polymyalgia rheumatica: Secondary | ICD-10-CM | POA: Diagnosis not present

## 2019-08-30 DIAGNOSIS — M810 Age-related osteoporosis without current pathological fracture: Secondary | ICD-10-CM | POA: Diagnosis not present

## 2019-09-15 ENCOUNTER — Other Ambulatory Visit: Payer: Self-pay | Admitting: Internal Medicine

## 2019-09-19 DIAGNOSIS — M1990 Unspecified osteoarthritis, unspecified site: Secondary | ICD-10-CM | POA: Diagnosis not present

## 2019-09-19 DIAGNOSIS — M353 Polymyalgia rheumatica: Secondary | ICD-10-CM | POA: Diagnosis not present

## 2019-09-30 ENCOUNTER — Other Ambulatory Visit: Payer: Self-pay | Admitting: Internal Medicine

## 2019-09-30 ENCOUNTER — Ambulatory Visit: Payer: PPO | Admitting: Internal Medicine

## 2019-09-30 DIAGNOSIS — Z0289 Encounter for other administrative examinations: Secondary | ICD-10-CM

## 2019-10-12 ENCOUNTER — Other Ambulatory Visit: Payer: Self-pay | Admitting: Internal Medicine

## 2019-11-08 ENCOUNTER — Other Ambulatory Visit: Payer: Self-pay | Admitting: Internal Medicine

## 2019-11-14 ENCOUNTER — Other Ambulatory Visit: Payer: Self-pay | Admitting: Internal Medicine

## 2019-11-24 ENCOUNTER — Other Ambulatory Visit: Payer: Self-pay | Admitting: Internal Medicine

## 2019-11-25 ENCOUNTER — Other Ambulatory Visit: Payer: Self-pay

## 2019-11-25 DIAGNOSIS — I712 Thoracic aortic aneurysm, without rupture, unspecified: Secondary | ICD-10-CM

## 2019-12-09 ENCOUNTER — Telehealth: Payer: Self-pay | Admitting: Internal Medicine

## 2019-12-09 NOTE — Telephone Encounter (Signed)
Left message to call the office to reschedule 12/12/19 appt. The provider will be out of the office at the patient's scheduled time.

## 2019-12-12 ENCOUNTER — Ambulatory Visit: Payer: PPO | Admitting: Internal Medicine

## 2019-12-12 ENCOUNTER — Other Ambulatory Visit: Payer: Self-pay | Admitting: Internal Medicine

## 2019-12-12 NOTE — Telephone Encounter (Signed)
ok'd refill for alprazolam #30 with 1 refill

## 2020-01-03 ENCOUNTER — Other Ambulatory Visit: Payer: Self-pay | Admitting: Internal Medicine

## 2020-01-05 ENCOUNTER — Telehealth: Payer: Self-pay

## 2020-01-05 ENCOUNTER — Ambulatory Visit: Payer: PPO

## 2020-01-05 NOTE — Telephone Encounter (Signed)
Unsuccessful attempt to reach patient for scheduled awv on preferred number. No answer. Unable to leave a message. Please reschedule as appropriate.

## 2020-01-09 ENCOUNTER — Other Ambulatory Visit: Payer: Self-pay | Admitting: Internal Medicine

## 2020-01-11 ENCOUNTER — Other Ambulatory Visit: Payer: Self-pay | Admitting: Internal Medicine

## 2020-01-16 ENCOUNTER — Other Ambulatory Visit: Payer: Self-pay

## 2020-01-16 ENCOUNTER — Ambulatory Visit
Admission: RE | Admit: 2020-01-16 | Discharge: 2020-01-16 | Disposition: A | Discharge status: Home / Self Care | Payer: PPO | Source: Ambulatory Visit | Attending: Cardiothoracic Surgery | Admitting: Cardiothoracic Surgery

## 2020-01-16 DIAGNOSIS — R911 Solitary pulmonary nodule: Secondary | ICD-10-CM | POA: Diagnosis not present

## 2020-01-16 DIAGNOSIS — I712 Thoracic aortic aneurysm, without rupture, unspecified: Secondary | ICD-10-CM

## 2020-01-16 LAB — POCT I-STAT CREATININE: Creatinine, Ser: 1.1 mg/dL — ABNORMAL HIGH (ref 0.44–1.00)

## 2020-01-16 MED ORDER — IOHEXOL 350 MG/ML SOLN
75.0000 mL | Freq: Once | INTRAVENOUS | Status: AC | PRN
  Administered 2020-01-16: 75 mL via INTRAVENOUS

## 2020-01-23 ENCOUNTER — Ambulatory Visit: Payer: PPO | Admitting: Cardiothoracic Surgery

## 2020-01-23 ENCOUNTER — Encounter: Payer: Self-pay | Admitting: Cardiothoracic Surgery

## 2020-01-23 ENCOUNTER — Other Ambulatory Visit: Payer: Self-pay

## 2020-01-23 VITALS — BP 183/70 | HR 55 | Temp 97.4°F | Ht 65.0 in | Wt 144.0 lb

## 2020-01-23 DIAGNOSIS — I712 Thoracic aortic aneurysm, without rupture, unspecified: Secondary | ICD-10-CM

## 2020-01-23 NOTE — Progress Notes (Signed)
[facility] Follow Up Note  [name], female   DOB: [birth year], 84 y.o.   MRN: [id number]  HISTORY: She comes in today without any complaints.  She states that she had a headache last week for 2 nights and took some of her daughters migraine medication which him proved her symptoms and she has had none since.  She does not complain of any chest pain although she relates a story of being admitted to the hospital for substernal chest pain about 6 months ago when she took some medication for her arthritis.  The details of that are unclear to me.  She states that she does have a blood pressure monitor at home when she takes her blood pressure and that she usually in the 130s.  Today her blood pressure is elevated because she felt that she was running late for her appointment and could not find the office.  She became very upset and tearful.  She believes this is why her blood pressures been elevated.  She states that Dr. Nicki Reaper manages her blood pressure and is very diligent with her blood pressure monitoring.    Vitals:   01/23/20 1522 01/23/20 1524  BP: (!) 177/76 (!) 183/70  Pulse: (!) 55   Temp: (!) 97.4 F (36.3 C)   SpO2: 98%      EXAM:  Resp: Lungs are clear bilaterally.  No respiratory distress, normal effort. Heart:  Regular with a systolic murmur best heard in the aortic area.   Abd:  Abdomen is soft, non distended and non tender. No masses are palpable.  There is no rebound and no guarding.  Neurological: Alert and oriented to person, place, and time. Coordination normal.  Skin: Skin is warm and dry. No rash noted. No diaphoretic. No erythema. No pallor.  Psychiatric: Normal mood and affect. Normal behavior. Judgment and thought content normal.      ASSESSMENT: I have independently reviewed the patient's CT scan.  The ascending aorta measures approximately 3.7 cm and has been unchanged over the last several years.  There is nothing to suggest any other aortic  pathology.   PLAN:   I told her that we could see her back again in 1 year with a CT of the chest without contrast.  The ascending aortic can be fairly well measured without contrast on the scan.  She is in agreement.  She will continue her follow-up with Dr. Nicki Reaper for blood pressure management.    Nestor Lewandowsky, MD

## 2020-01-23 NOTE — Patient Instructions (Signed)
Follow up in 1 year with CT scan prior to appointment. If you do not hear from [place] by the end of October 2022, call our office so we can get you scheduled.

## 2020-01-24 ENCOUNTER — Telehealth: Payer: Self-pay | Admitting: Internal Medicine

## 2020-01-24 NOTE — Telephone Encounter (Signed)
Appointment has been scheduled.

## 2020-01-24 NOTE — Telephone Encounter (Signed)
Patient stated she wakes up by a dark explosion while she is sleeping. She says she started seeing them four years ago, while she is asleep she sees a dark place then an explosion and it hurts her head and she wakes up.

## 2020-01-24 NOTE — Telephone Encounter (Signed)
Per note, sounds like have started having symptoms again and if having headache and the "explosion", needs to be evaluated.

## 2020-01-24 NOTE — Telephone Encounter (Signed)
Will you please triage and see what she is talking about

## 2020-01-24 NOTE — Telephone Encounter (Signed)
Per note, this has been going on for four years, is there a change today?  Has she been evaluated for this previously?   Has a history of thoracic aortic aneurysm - sees Dr Faith Rogue.  He saw her yesterday and she reported previous two day headache.  If acute change and having increased headache, etc, - needs to be evaluated.

## 2020-01-24 NOTE — Telephone Encounter (Signed)
Patient stated these have been going on four years. No real changes. They have just started up again. She stated the headaches where from the episodes. No other sx.

## 2020-01-24 NOTE — Telephone Encounter (Signed)
Patient called in about explosion in her head have been having them for several years now wanted to know what she should do about them

## 2020-01-30 ENCOUNTER — Ambulatory Visit: Payer: PPO | Admitting: Cardiothoracic Surgery

## 2020-01-31 ENCOUNTER — Telehealth (INDEPENDENT_AMBULATORY_CARE_PROVIDER_SITE_OTHER): Payer: PPO | Admitting: Internal Medicine

## 2020-01-31 DIAGNOSIS — F419 Anxiety disorder, unspecified: Secondary | ICD-10-CM | POA: Diagnosis not present

## 2020-01-31 DIAGNOSIS — I6523 Occlusion and stenosis of bilateral carotid arteries: Secondary | ICD-10-CM

## 2020-01-31 DIAGNOSIS — R519 Headache, unspecified: Secondary | ICD-10-CM

## 2020-01-31 DIAGNOSIS — I7121 Aneurysm of the ascending aorta, without rupture: Secondary | ICD-10-CM

## 2020-01-31 DIAGNOSIS — E7800 Pure hypercholesterolemia, unspecified: Secondary | ICD-10-CM | POA: Diagnosis not present

## 2020-01-31 DIAGNOSIS — M353 Polymyalgia rheumatica: Secondary | ICD-10-CM | POA: Diagnosis not present

## 2020-01-31 DIAGNOSIS — I10 Essential (primary) hypertension: Secondary | ICD-10-CM

## 2020-01-31 DIAGNOSIS — I712 Thoracic aortic aneurysm, without rupture: Secondary | ICD-10-CM | POA: Diagnosis not present

## 2020-01-31 NOTE — Progress Notes (Signed)
[name], female   DOB: [birth year], 84 y.o.   MRN: [id number]   Virtual Visit via telephone Note  This visit type was conducted due to national recommendations for restrictions regarding the COVID-19 pandemic (e.g. social distancing).  This format is felt to be most appropriate for this patient at this time.  All issues noted in this document were discussed and addressed.  No physical exam was performed (except for noted visual exam findings with Video Visits).   I connected with Beth Espinoza by telephone and verified that I am speaking with the correct person using two identifiers. Location patient: home Location provider: work  Persons participating in the telephone visit: patient, provider  The limitations, risks, security and privacy concerns of performing an evaluation and management service by telephone and the availability of in person appointments have been discussed.  It has also been discussed with the patient that there may be a patient responsible charge related to this service. The patient expressed understanding and agreed to proceed.   Reason for visit: work in appt  HPI: Reports recently while dreaming, she will see an explosion.  Will hear a loud noise and will see things burning (in her dream).  Makes her anxious when this occurs.  Wakes her up from sleeping.  Also, the last two weekends, she has had a migraine headache.  Headache described as a ring around her head - headache extends up from the ring.  No sinus congestion or drainage.  No fever.  No nausea or vomiting.  She took her daughter's migraine medication.  Headache lasted approximately two days.  No headache now.  No dizziness or light headedness.  She also describes "puffing: out of her ear - sounds like puffs of air.  Noticed some ringing in her ears.  States her daughter has checked her blood pressure and was ok.  No chest pain or sob.     ROS: See pertinent positives and negatives per HPI.  Past Medical  History:  Diagnosis Date  . Anxiety   . Arthritis   . Cataract   . History of chicken pox   . History of kidney stones October 2013  . Hx: UTI (urinary tract infection)   . Hyperlipidemia   . Hypertension   . Rheumatoid arthritis ([place])   . Stroke [facility])    tia    Past Surgical History:  Procedure Laterality Date  . ABDOMINAL HYSTERECTOMY    . BREAST BIOPSY Right 1995  . CATARACT EXTRACTION W/PHACO Left 04/13/2018   Procedure: CATARACT EXTRACTION PHACO AND INTRAOCULAR LENS PLACEMENT ([place]) Left Eye;  Surgeon: Birder Robson, MD;  Location: [facility] ORS;  Service: Ophthalmology;  Laterality: Left;  [place]  00:47.0 CDE 5.52 Fluid pack lot # [id number]  . EYE SURGERY    . IR ANGIO INTRA EXTRACRAN SEL COM CAROTID INNOMINATE BILAT MOD SED  07/18/2016  . IR ANGIO VERTEBRAL SEL VERTEBRAL BILAT MOD SED  07/18/2016  . PARTIAL HYSTERECTOMY  1972  . TONSILLECTOMY AND ADENOIDECTOMY  1945    Family History  Problem Relation Age of Onset  . Asthma Mother   . Diabetes Mother   . Cataracts Mother   . Heart disease Father   . Diabetes Father   . Hypertension Father   . Cancer Paternal Aunt        Liver  . Diabetes Paternal Grandmother   . Diabetes Sister   . Retinal degeneration Sister   . Glaucoma Sister     SOCIAL HX: reviewed.  Current Outpatient Medications:  .  ALPRAZolam (XANAX) 0.5 MG tablet, Take 1 tablet (0.5 mg total) by mouth daily as needed for anxiety., Disp: 30 tablet, Rfl: 1 .  amLODipine (NORVASC) 5 MG tablet, Take 1 tablet (5 mg total) by mouth daily., Disp: 90 tablet, Rfl: 0 .  ascorbic acid (VITAMIN C) 1000 MG tablet, Take 1,000 mg by mouth daily., Disp: , Rfl:  .  atenolol (TENORMIN) 25 MG tablet, Take 1 tablet (25 mg total) by mouth daily., Disp: 90 tablet, Rfl: 1 .  cholecalciferol (VITAMIN D) 25 MCG tablet, Take 5 tablets (5,000 Units total) by mouth daily., Disp: 30 tablet, Rfl: 1 .  clopidogrel (PLAVIX) 75 MG tablet, Take 1 tablet (75 mg total) by mouth daily.,  Disp: 90 tablet, Rfl: 0 .  folic acid (FOLVITE) 1 MG tablet, Take 2 mg by mouth daily. , Disp: , Rfl:  .  methotrexate (RHEUMATREX) 2.5 MG tablet, Take 6 tablets per week. (Patient taking differently: Take 12.5 mg by mouth every Saturday. ), Disp: 4 tablet, Rfl: 0 .  PARoxetine (PAXIL) 20 MG tablet, Take 1 tablet (20 mg total) by mouth daily., Disp: 90 tablet, Rfl: 0  EXAM:  GENERAL: alert. Sounds to be in no acute distress.  Answering questions appropriately.    PSYCH/NEURO: pleasant and cooperative, no obvious depression or anxiety, speech and thought processing grossly intact  ASSESSMENT AND PLAN:  Discussed the following assessment and plan:  Problem List Items Addressed This Visit    Polymyalgia rheumatica ([place])    Followed by rheumatology.  On MTX.       Hypercholesterolemia    Had intolerance to crestor and zetia.  Follow lipid panel.  Checking in to PA for repatha.       Headache    Two recent episodes of headaches as outlined. States took her daughter's migraine medication (unsure of name) and this helped.  No headache currently.  No dizziness or light headedness.  Describes a loud noise and sees an explosion which wakes her up from sleep.  Increased anxiety associated.  Eating.  No nausea or vomiting.  MRI brain one year ago, ok.  Discussed further w/up.  Will d/w neurology.  Discussed magnesium supplements to help with headache.        Essential hypertension    On amlodipine.  Blood pressure per her report - doing ok now.  Follow pressures.        Carotid stenosis    Evaluated by [facility] 03/2019.  Stable.  Recommended f/u in 24 months. Continue plavix.       Ascending aortic aneurysm (HCC)    Just evaluated by Dr Genevive Bi.  Stable.        Anxiety    Increased anxiety related to these episodes.  On paxil.  Follow. W/up as outlined.            I discussed the assessment and treatment plan with the patient. The patient was provided an opportunity to ask questions and  all were answered. The patient agreed with the plan and demonstrated an understanding of the instructions.   The patient was advised to call back or seek an in-person evaluation if the symptoms worsen or if the condition fails to improve as anticipated.  I provided 25 minutes of non-face-to-face time during this encounter.   [name], MD

## 2020-02-01 ENCOUNTER — Encounter: Payer: Self-pay | Admitting: Internal Medicine

## 2020-02-01 DIAGNOSIS — R519 Headache, unspecified: Secondary | ICD-10-CM | POA: Insufficient documentation

## 2020-02-01 NOTE — Assessment & Plan Note (Signed)
Two recent episodes of headaches as outlined. States took her daughter's migraine medication (unsure of name) and this helped.  No headache currently.  No dizziness or light headedness.  Describes a loud noise and sees an explosion which wakes her up from sleep.  Increased anxiety associated.  Eating.  No nausea or vomiting.  MRI brain one year ago, ok.  Discussed further w/up.  Will d/w neurology.  Discussed magnesium supplements to help with headache.

## 2020-02-01 NOTE — Assessment & Plan Note (Signed)
On amlodipine.  Blood pressure per her report - doing ok now.  Follow pressures.

## 2020-02-01 NOTE — Assessment & Plan Note (Signed)
Followed by rheumatology.  On MTX.

## 2020-02-01 NOTE — Assessment & Plan Note (Signed)
Had intolerance to crestor and zetia.  Follow lipid panel.  Checking in to PA for repatha.

## 2020-02-01 NOTE — Assessment & Plan Note (Addendum)
Increased anxiety related to these episodes.  On paxil.  Follow. W/up as outlined.

## 2020-02-01 NOTE — Assessment & Plan Note (Addendum)
Evaluated by [facility] 03/2019.  Stable.  Recommended f/u in 24 months.  Continue plavix.   ?

## 2020-02-01 NOTE — Assessment & Plan Note (Signed)
Just evaluated by Dr Genevive Bi.  Stable.

## 2020-02-06 ENCOUNTER — Other Ambulatory Visit: Payer: Self-pay | Admitting: Internal Medicine

## 2020-02-17 ENCOUNTER — Other Ambulatory Visit: Payer: Self-pay

## 2020-02-17 ENCOUNTER — Ambulatory Visit (INDEPENDENT_AMBULATORY_CARE_PROVIDER_SITE_OTHER): Payer: PPO | Admitting: Internal Medicine

## 2020-02-17 VITALS — BP 126/70 | HR 53 | Temp 98.2°F | Resp 16 | Ht 65.0 in | Wt 143.4 lb

## 2020-02-17 DIAGNOSIS — R519 Headache, unspecified: Secondary | ICD-10-CM

## 2020-02-17 DIAGNOSIS — I712 Thoracic aortic aneurysm, without rupture: Secondary | ICD-10-CM

## 2020-02-17 DIAGNOSIS — G4750 Parasomnia, unspecified: Secondary | ICD-10-CM

## 2020-02-17 DIAGNOSIS — M353 Polymyalgia rheumatica: Secondary | ICD-10-CM

## 2020-02-17 DIAGNOSIS — E7800 Pure hypercholesterolemia, unspecified: Secondary | ICD-10-CM

## 2020-02-17 DIAGNOSIS — M810 Age-related osteoporosis without current pathological fracture: Secondary | ICD-10-CM | POA: Diagnosis not present

## 2020-02-17 DIAGNOSIS — I10 Essential (primary) hypertension: Secondary | ICD-10-CM

## 2020-02-17 DIAGNOSIS — I6523 Occlusion and stenosis of bilateral carotid arteries: Secondary | ICD-10-CM | POA: Diagnosis not present

## 2020-02-17 DIAGNOSIS — I7121 Aneurysm of the ascending aorta, without rupture: Secondary | ICD-10-CM

## 2020-02-17 DIAGNOSIS — F419 Anxiety disorder, unspecified: Secondary | ICD-10-CM

## 2020-02-17 NOTE — Progress Notes (Addendum)
[name], female   DOB: 01/09/1936, 84 y.o.   MRN: [id number]   Subjective:    [name], female    DOB: [birth year], 84 y.o.   MRN: [id number]  HPI This visit occurred during the SARS-CoV-2 public health emergency.  Safety protocols were in place, including screening questions prior to the visit, additional usage of staff PPE, and extensive cleaning of exam room while observing appropriate contact time as indicated for disinfecting solutions.  Patient here for a scheduled follow up.  Here to follow up regarding her cholesterol, blood pressure. On recent visit, described hearing a loud noise/explosion - at night.  Wakes her from sleep.  No one else hears.  Occurred on two separate occasions recently.  No occurring now.  No headache now.  States had two headaches previously.  Took her daughters migraine medication.  Resolved.  No vision change.  Eating.  No nausea or vomiting.  Bowels moving.  No abdominal pain reported.  Discussed exploding head syndrome.  Discussed neurology evaluation.   Past Medical History:  Diagnosis Date  . Anxiety   . Arthritis   . Cataract   . History of chicken pox   . History of kidney stones October 2013  . Hx: UTI (urinary tract infection)   . Hyperlipidemia   . Hypertension   . Rheumatoid arthritis (HCC)   . Stroke [facility])    tia   Past Surgical History:  Procedure Laterality Date  . ABDOMINAL HYSTERECTOMY    . BREAST BIOPSY Right 1995  . CATARACT EXTRACTION W/PHACO Left 04/13/2018   Procedure: CATARACT EXTRACTION PHACO AND INTRAOCULAR LENS PLACEMENT (IOC) Left Eye;  Surgeon: Galen Manila, MD;  Location: [facility] ORS;  Service: Ophthalmology;  Laterality: Left;  [place]  00:47.0 CDE 5.52 Fluid pack lot # [id number]  . EYE SURGERY    . IR ANGIO INTRA EXTRACRAN SEL COM CAROTID INNOMINATE BILAT MOD SED  07/18/2016  . IR ANGIO VERTEBRAL SEL VERTEBRAL BILAT MOD SED  07/18/2016  . PARTIAL HYSTERECTOMY  1972  . TONSILLECTOMY AND ADENOIDECTOMY  1945    Family History  Problem Relation Age of Onset  . Asthma Mother   . Diabetes Mother   . Cataracts Mother   . Heart disease Father   . Diabetes Father   . Hypertension Father   . Cancer Paternal Aunt        Liver  . Diabetes Paternal Grandmother   . Diabetes Sister   . Retinal degeneration Sister   . Glaucoma Sister    Social History   Socioeconomic History  . Marital status: Married    Spouse name: Not on file  . Number of children: Not on file  . Years of education: Not on file  . Highest education level: Not on file  Occupational History  . Not on file  Tobacco Use  . Smoking status: Never Smoker  . Smokeless tobacco: Never Used  Vaping Use  . Vaping Use: Unknown  Substance and Sexual Activity  . Alcohol use: No    Alcohol/week: 0.0 standard drinks  . Drug use: No  . Sexual activity: Not on file  Other Topics Concern  . Not on file  Social History Narrative  . Not on file   Social Determinants of Health   Financial Resource Strain: Not on file  Food Insecurity: Not on file  Transportation Needs: Not on file  Physical Activity: Not on file  Stress: Not on file  Social Connections: Not on file  Outpatient Encounter Medications as of 02/17/2020  Medication Sig  . ALPRAZolam (XANAX) 0.5 MG tablet Take 1 tablet (0.5 mg total) by mouth daily as needed for anxiety.  Marland Kitchen amLODipine (NORVASC) 5 MG tablet Take 1 tablet (5 mg total) by mouth daily.  Marland Kitchen ascorbic acid (VITAMIN C) 1000 MG tablet Take 1,000 mg by mouth daily.  Marland Kitchen atenolol (TENORMIN) 25 MG tablet Take 1 tablet (25 mg total) by mouth daily.  . cholecalciferol (VITAMIN D) 25 MCG tablet Take 5 tablets (5,000 Units total) by mouth daily.  . clopidogrel (PLAVIX) 75 MG tablet Take 1 tablet (75 mg total) by mouth daily.  . folic acid (FOLVITE) 1 MG tablet Take 2 mg by mouth daily.   . methotrexate (RHEUMATREX) 2.5 MG tablet Take 6 tablets per week. (Patient taking differently: Take 12.5 mg by mouth every  Saturday. )  . PARoxetine (PAXIL) 20 MG tablet Take 1 tablet (20 mg total) by mouth daily.   No facility-administered encounter medications on file as of 02/17/2020.    Review of Systems  Constitutional: Negative for appetite change and unexpected weight change.  HENT: Negative for congestion and sinus pressure.   Respiratory: Negative for cough, chest tightness and shortness of breath.   Cardiovascular: Negative for chest pain, palpitations and leg swelling.  Gastrointestinal: Negative for abdominal pain, diarrhea, nausea and vomiting.  Genitourinary: Negative for difficulty urinating and dysuria.  Musculoskeletal: Negative for joint swelling and myalgias.  Skin: Negative for color change and rash.  Neurological: Negative for dizziness, light-headedness and headaches.       No headache now.    Psychiatric/Behavioral: Negative for agitation and dysphoric mood.       Objective:    Physical Exam Vitals reviewed.  Constitutional:      General: She is not in acute distress.    Appearance: Normal appearance.  HENT:     Head: Normocephalic and atraumatic.     Right Ear: External ear normal.     Left Ear: External ear normal.     Mouth/Throat:     Mouth: Oropharynx is clear and moist.  Eyes:     General: No scleral icterus.       Right eye: No discharge.        Left eye: No discharge.     Conjunctiva/sclera: Conjunctivae normal.  Neck:     Thyroid: No thyromegaly.  Cardiovascular:     Rate and Rhythm: Normal rate and regular rhythm.  Pulmonary:     Effort: No respiratory distress.     Breath sounds: Normal breath sounds. No wheezing.  Abdominal:     General: Bowel sounds are normal.     Palpations: Abdomen is soft.     Tenderness: There is no abdominal tenderness.  Musculoskeletal:        General: No swelling, tenderness or edema.     Cervical back: Neck supple. No tenderness.  Lymphadenopathy:     Cervical: No cervical adenopathy.  Skin:    Findings: No erythema or  rash.  Neurological:     Mental Status: She is alert.  Psychiatric:        Mood and Affect: Mood normal.        Behavior: Behavior normal.     BP 126/70   Pulse (!) 53   Temp 98.2 F (36.8 C) (Oral)   Resp 16   Ht $R'5\' 5"'Ye$  (1.651 m)   Wt 143 lb 6.4 oz (65 kg)   SpO2 97%   BMI 23.86 kg/m  Wt Readings from Last 3 Encounters:  02/17/20 143 lb 6.4 oz (65 kg)  01/23/20 144 lb (65.3 kg)  08/11/19 140 lb (63.5 kg)     Lab Results  Component Value Date   WBC 9.4 02/17/2020   HGB 14.1 02/17/2020   HCT 41.8 02/17/2020   PLT 309 02/17/2020   GLUCOSE 133 (H) 02/17/2020   CHOL 257 (H) 05/12/2019   TRIG 210.0 (H) 05/12/2019   HDL 42.10 05/12/2019   LDLDIRECT 168.0 05/12/2019   LDLCALC 218 (H) 08/12/2018   ALT 13 02/17/2020   AST 20 02/17/2020   NA 141 02/17/2020   K 4.0 02/17/2020   CL 106 02/17/2020   CREATININE 1.06 (H) 02/17/2020   BUN 17 02/17/2020   CO2 24 02/17/2020   TSH 1.69 02/17/2020   INR 1.07 07/18/2016    CT ANGIO CHEST AORTA W/CM & OR WO/CM  Result Date: 01/16/2020 CLINICAL DATA:  Thoracic aortic aneurysm follow-up EXAM: CT ANGIOGRAPHY CHEST WITH CONTRAST TECHNIQUE: Multidetector CT imaging of the chest was performed using the standard protocol during bolus administration of intravenous contrast. Multiplanar CT image reconstructions and MIPs were obtained to evaluate the vascular anatomy. CONTRAST:  52mL OMNIPAQUE IOHEXOL 350 MG/ML SOLN COMPARISON:  01/13/2019 FINDINGS: Cardiovascular: Tortuous, ectatic aorta. Ascending thoracic aorta measures maximally 3.7 cm. Distal aortic arch measures 3.4 cm. Proximal descending thoracic aorta measures 2.5 cm. Moderate calcifications throughout the descending thoracic aorta and aortic arch. Heart is normal size. Mediastinum/Nodes: No mediastinal, hilar, or axillary adenopathy. Trachea and esophagus are unremarkable. Thyroid unremarkable. Lungs/Pleura: Biapical scarring. No confluent opacities, effusions or suspicious pulmonary  nodules. Upper Abdomen: Imaging into the upper abdomen demonstrates no acute findings. Musculoskeletal: Chest wall soft tissues are unremarkable. No acute bony abnormality. Review of the MIP images confirms the above findings. IMPRESSION: Ascending thoracic aorta ectatic, nonaneurysmal measuring maximally 3.7 cm. No acute cardiopulmonary disease. Aortic Atherosclerosis (ICD10-I70.0). Electronically Signed   By: Rolm Baptise M.D.   On: 01/16/2020 23:37       Assessment & Plan:   Problem List Items Addressed This Visit    Hypercholesterolemia    Intolerance to zetia and crestor.  Declines cholesterol medication at this time.  Follow lipid panel.  Low cholesterol diet and exercise.        Relevant Orders   Hepatic function panel (Completed)   Anxiety    Continues on paxil.  Takes xanax prn.  Follow.        Polymyalgia rheumatica (HCC)    Continue MTX.  Followed by rehumatology.       Essential hypertension - Primary    Continue amlodipine.  Blood pressure doing well.  Follow pressures.  Follow metabolic panel.       Relevant Orders   Basic metabolic panel (Completed)   CBC with Differential/Platelet (Completed)   TSH (Completed)   Carotid stenosis    Evaluated by [facility] 03/2019.  Stable.  Recommended f/u in 24 months.  Continue plavix.        Ascending aortic aneurysm (HCC)    Saw Dr Genevive Bi 01/2020.  Stable.  Recommended f/u in one year.        Osteoporosis    Had reaction to reclast.  Calcium, vitamin D and weight bearing exercise.        Relevant Orders   VITAMIN D 25 Hydroxy (Vit-D Deficiency, Fractures) (Completed)   Headache    Had two episodes - headaches recently.  Took her daughter's migraine medication.  Resolved.  No headaches now.  Follow for triggers.  Notify if recurs.  Neurology evaluation planned as outlined.       Relevant Orders   Sedimentation rate (Completed)   Ambulatory referral to Neurology   Exploding head syndrome    Describes the loud explosion -  heard when asleep.  Resolved now.  No headache associated. No headache now.  Will check esr.  Schedule f/u with neurology.        Relevant Orders   Ambulatory referral to Neurology       [name], MD

## 2020-02-18 ENCOUNTER — Encounter: Payer: Self-pay | Admitting: Internal Medicine

## 2020-02-18 DIAGNOSIS — G4750 Parasomnia, unspecified: Secondary | ICD-10-CM | POA: Insufficient documentation

## 2020-02-18 LAB — HEPATIC FUNCTION PANEL
AG Ratio: 1.7 (calc) (ref 1.0–2.5)
ALT: 13 U/L (ref 6–29)
AST: 20 U/L (ref 10–35)
Albumin: 4.3 g/dL (ref 3.6–5.1)
Alkaline phosphatase (APISO): 47 U/L (ref 37–153)
Bilirubin, Direct: 0 mg/dL (ref 0.0–0.2)
Globulin: 2.5 g/dL (calc) (ref 1.9–3.7)
Indirect Bilirubin: 0.3 mg/dL (calc) (ref 0.2–1.2)
Total Bilirubin: 0.3 mg/dL (ref 0.2–1.2)
Total Protein: 6.8 g/dL (ref 6.1–8.1)

## 2020-02-18 LAB — CBC WITH DIFFERENTIAL/PLATELET
Absolute Monocytes: 677 cells/uL (ref 200–950)
Basophils Absolute: 122 cells/uL (ref 0–200)
Basophils Relative: 1.3 %
Eosinophils Absolute: 338 cells/uL (ref 15–500)
Eosinophils Relative: 3.6 %
HCT: 41.8 % (ref 35.0–45.0)
Hemoglobin: 14.1 g/dL (ref 11.7–15.5)
Lymphs Abs: 2576 cells/uL (ref 850–3900)
MCH: 29.7 pg (ref 27.0–33.0)
MCHC: 33.7 g/dL (ref 32.0–36.0)
MCV: 88.2 fL (ref 80.0–100.0)
MPV: 10.4 fL (ref 7.5–12.5)
Monocytes Relative: 7.2 %
Neutro Abs: 5687 cells/uL (ref 1500–7800)
Neutrophils Relative %: 60.5 %
Platelets: 309 10*3/uL (ref 140–400)
RBC: 4.74 10*6/uL (ref 3.80–5.10)
RDW: 13 % (ref 11.0–15.0)
Total Lymphocyte: 27.4 %
WBC: 9.4 10*3/uL (ref 3.8–10.8)

## 2020-02-18 LAB — BASIC METABOLIC PANEL
BUN/Creatinine Ratio: 16 (calc) (ref 6–22)
BUN: 17 mg/dL (ref 7–25)
CO2: 24 mmol/L (ref 20–32)
Calcium: 9.6 mg/dL (ref 8.6–10.4)
Chloride: 106 mmol/L (ref 98–110)
Creat: 1.06 mg/dL — ABNORMAL HIGH (ref 0.60–0.88)
Glucose, Bld: 133 mg/dL — ABNORMAL HIGH (ref 65–99)
Potassium: 4 mmol/L (ref 3.5–5.3)
Sodium: 141 mmol/L (ref 135–146)

## 2020-02-18 LAB — VITAMIN D 25 HYDROXY (VIT D DEFICIENCY, FRACTURES): Vit D, 25-Hydroxy: 75 ng/mL (ref 30–100)

## 2020-02-18 LAB — TSH: TSH: 1.69 mIU/L (ref 0.40–4.50)

## 2020-02-18 LAB — SEDIMENTATION RATE: Sed Rate: 17 mm/h (ref 0–30)

## 2020-02-18 NOTE — Assessment & Plan Note (Signed)
Evaluated by [facility] 03/2019.  Stable.  Recommended f/u in 24 months.  Continue plavix.   ?

## 2020-02-18 NOTE — Assessment & Plan Note (Signed)
Continues on paxil.  Takes xanax prn.  Follow.

## 2020-02-18 NOTE — Addendum Note (Signed)
Addended by: Alisa Graff on: 02/18/2020 05:43 PM   Modules accepted: Orders

## 2020-02-18 NOTE — Assessment & Plan Note (Signed)
Saw Dr Oaks 01/2020.  Stable.  Recommended f/u in one year.   

## 2020-02-18 NOTE — Assessment & Plan Note (Signed)
Describes the loud explosion - heard when asleep.  Resolved now.  No headache associated. No headache now.  Will check esr.  Schedule f/u with neurology.

## 2020-02-18 NOTE — Assessment & Plan Note (Signed)
Continue MTX.  Followed by rehumatology.

## 2020-02-18 NOTE — Assessment & Plan Note (Signed)
Had two episodes - headaches recently.  Took her daughter's migraine medication.  Resolved.  No headaches now.  Follow for triggers.  Notify if recurs.  Neurology evaluation planned as outlined.

## 2020-02-18 NOTE — Assessment & Plan Note (Signed)
Continue amlodipine.  Blood pressure doing well.  Follow pressures.  Follow metabolic panel.  

## 2020-02-18 NOTE — Assessment & Plan Note (Signed)
Intolerance to zetia and crestor.  Declines cholesterol medication at this time.  Follow lipid panel.  Low cholesterol diet and exercise.

## 2020-02-18 NOTE — Assessment & Plan Note (Signed)
Had reaction to reclast.  Calcium, vitamin D and weight bearing exercise.  

## 2020-02-20 ENCOUNTER — Other Ambulatory Visit: Payer: Self-pay | Admitting: Internal Medicine

## 2020-02-23 ENCOUNTER — Ambulatory Visit: Payer: PPO | Admitting: Internal Medicine

## 2020-03-16 ENCOUNTER — Other Ambulatory Visit: Payer: Self-pay | Admitting: Internal Medicine

## 2020-03-22 DIAGNOSIS — M353 Polymyalgia rheumatica: Secondary | ICD-10-CM | POA: Diagnosis not present

## 2020-03-22 DIAGNOSIS — M1990 Unspecified osteoarthritis, unspecified site: Secondary | ICD-10-CM | POA: Diagnosis not present

## 2020-03-30 DIAGNOSIS — F0280 Dementia in other diseases classified elsewhere without behavioral disturbance: Secondary | ICD-10-CM | POA: Diagnosis not present

## 2020-03-30 DIAGNOSIS — Z8669 Personal history of other diseases of the nervous system and sense organs: Secondary | ICD-10-CM | POA: Diagnosis not present

## 2020-03-30 DIAGNOSIS — F0150 Vascular dementia without behavioral disturbance: Secondary | ICD-10-CM | POA: Diagnosis not present

## 2020-03-30 DIAGNOSIS — R440 Auditory hallucinations: Secondary | ICD-10-CM | POA: Diagnosis not present

## 2020-03-30 DIAGNOSIS — R441 Visual hallucinations: Secondary | ICD-10-CM | POA: Diagnosis not present

## 2020-03-30 DIAGNOSIS — F22 Delusional disorders: Secondary | ICD-10-CM | POA: Diagnosis not present

## 2020-03-30 DIAGNOSIS — G309 Alzheimer's disease, unspecified: Secondary | ICD-10-CM | POA: Diagnosis not present

## 2020-03-30 DIAGNOSIS — R519 Headache, unspecified: Secondary | ICD-10-CM | POA: Diagnosis not present

## 2020-04-02 ENCOUNTER — Ambulatory Visit (INDEPENDENT_AMBULATORY_CARE_PROVIDER_SITE_OTHER): Payer: PPO

## 2020-04-02 ENCOUNTER — Other Ambulatory Visit: Payer: Self-pay | Admitting: Internal Medicine

## 2020-04-02 VITALS — Ht 65.0 in | Wt 143.0 lb

## 2020-04-02 DIAGNOSIS — Z0000 Encounter for general adult medical examination without abnormal findings: Secondary | ICD-10-CM

## 2020-04-02 NOTE — Progress Notes (Signed)
Subjective:   Beth Espinoza is a 85 y.o. female who presents for Medicare Annual (Subsequent) preventive examination.  Review of Systems    No ROS.  Medicare Wellness Virtual Visit.    Cardiac Risk Factors include: advanced age (>43men, >71 women);hypertension     Objective:    Today's Vitals   04/02/20 1105  Weight: 143 lb (64.9 kg)  Height: 5\' 5"  (1.651 m)   Body mass index is 23.8 kg/m.  Advanced Directives 04/02/2020 01/16/2019 01/15/2019 01/15/2019 01/04/2019 04/13/2018 12/31/2017  Does Patient Have a Medical Advance Directive? Yes Yes Yes Yes Yes No Yes  Type of Paramedic of [place];Living will Healthcare Power of [place] of [place]  Does patient want to make changes to medical advance directive? No - Patient declined No - Guardian declined - - No - Patient declined - No - Patient declined  Copy of [place] in Chart? No - copy requested No - copy requested - - No - copy requested - No - copy requested  Would patient like information on creating a medical advance directive? - - - - - No - Patient declined -    Current Medications (verified) Outpatient Encounter Medications as of 04/02/2020  Medication Sig  . ALPRAZolam (XANAX) 0.5 MG tablet Take 1 tablet (0.5 mg total) by mouth daily as needed for anxiety.  Marland Kitchen amLODipine (NORVASC) 5 MG tablet Take 1 tablet (5 mg total) by mouth daily.  Marland Kitchen ascorbic acid (VITAMIN C) 1000 MG tablet Take 1,000 mg by mouth daily.  Marland Kitchen atenolol (TENORMIN) 25 MG tablet Take 1 tablet (25 mg total) by mouth daily.  . cholecalciferol (VITAMIN D) 25 MCG tablet Take 5 tablets (5,000 Units total) by mouth daily.  . folic acid (FOLVITE) 1 MG tablet Take 2 mg by mouth daily.   . methotrexate (RHEUMATREX) 2.5 MG tablet Take 6 tablets per week. (Patient taking differently: Take 12.5 mg by mouth every Saturday. )  .  PARoxetine (PAXIL) 20 MG tablet Take 1 tablet (20 mg total) by mouth daily.   No facility-administered encounter medications on file as of 04/02/2020.    Allergies (verified) Pneumovax [pneumococcal polysaccharide vaccine], Pneumococcal vaccine, and Reclast [zoledronic acid]   History: Past Medical History:  Diagnosis Date  . Anxiety   . Arthritis   . Cataract   . History of chicken pox   . History of kidney stones October 2013  . Hx: UTI (urinary tract infection)   . Hyperlipidemia   . Hypertension   . Rheumatoid arthritis ([place])   . Stroke [facility])    tia   Past Surgical History:  Procedure Laterality Date  . ABDOMINAL HYSTERECTOMY    . BREAST BIOPSY Right 1995  . CATARACT EXTRACTION W/PHACO Left 04/13/2018   Procedure: CATARACT EXTRACTION PHACO AND INTRAOCULAR LENS PLACEMENT ([place]) Left Eye;  Surgeon: Birder Robson, MD;  Location: [facility] ORS;  Service: Ophthalmology;  Laterality: Left;  [place]  00:47.0 CDE 5.52 Fluid pack lot # [id number]  . EYE SURGERY    . IR ANGIO INTRA EXTRACRAN SEL COM CAROTID INNOMINATE BILAT MOD SED  07/18/2016  . IR ANGIO VERTEBRAL SEL VERTEBRAL BILAT MOD SED  07/18/2016  . PARTIAL HYSTERECTOMY  1972  . TONSILLECTOMY AND ADENOIDECTOMY  1945   Family History  Problem Relation Age of Onset  . Asthma Mother   . Diabetes Mother   . Cataracts Mother   .  Heart disease Father   . Diabetes Father   . Hypertension Father   . Cancer Paternal Aunt        Liver  . Diabetes Paternal Grandmother   . Diabetes Sister   . Retinal degeneration Sister   . Glaucoma Sister    Social History   Socioeconomic History  . Marital status: Married    Spouse name: Not on file  . Number of children: Not on file  . Years of education: Not on file  . Highest education level: Not on file  Occupational History  . Not on file  Tobacco Use  . Smoking status: Never Smoker  . Smokeless tobacco: Never Used  Vaping Use  . Vaping Use: Unknown  Substance and Sexual Activity   . Alcohol use: No    Alcohol/week: 0.0 standard drinks  . Drug use: No  . Sexual activity: Not on file  Other Topics Concern  . Not on file  Social History Narrative  . Not on file   Social Determinants of Health   Financial Resource Strain: High Risk  . Difficulty of Paying Living Expenses: Very hard  Food Insecurity: No Food Insecurity  . Worried About Charity fundraiser in the Last Year: Never true  . Ran Out of Food in the Last Year: Never true  Transportation Needs: No Transportation Needs  . Lack of Transportation (Medical): No  . Lack of Transportation (Non-Medical): No  Physical Activity: Sufficiently Active  . Days of Exercise per Week: 7 days  . Minutes of Exercise per Session: 30 min  Stress: No Stress Concern Present  . Feeling of Stress : Not at all  Social Connections: Unknown  . Frequency of Communication with Friends and Family: More than three times a week  . Frequency of Social Gatherings with Friends and Family: More than three times a week  . Attends Religious Services: Not on file  . Active Member of Clubs or Organizations: Not on file  . Attends Archivist Meetings: Not on file  . Marital Status: Married    Tobacco Counseling Counseling given: Not Answered   Clinical Intake:  Pre-visit preparation completed: Yes        Diabetes: No  How often do you need to have someone help you when you read instructions, pamphlets, or other written materials from your doctor or pharmacy?: 3 - Sometimes   Interpreter Needed?: No      Activities of Daily Living In your present state of health, do you have any difficulty performing the following activities: 04/02/2020  Hearing? N  Vision? N  Difficulty concentrating or making decisions? Y  Walking or climbing stairs? N  Dressing or bathing? N  Doing errands, shopping? N  Comment Runs errands short distances only  Conservation officer, nature and eating ? N  Using the Toilet? N  In the past six  months, have you accidently leaked urine? Y  Comment Managed with daily pad  Do you have problems with loss of bowel control? N  Managing your Medications? Y  Comment Daughter assist  Managing your Finances? Y  Comment Daughter assist as needed  Housekeeping or managing your Housekeeping? N  Some recent data might be hidden    Patient Care Team: [name], MD as PCP - General (Internal Medicine) De Hollingshead, RPH-CPP as Pharmacist (Pharmacist)  Indicate any recent Medical Services you may have received from other than [facility] providers in the past year (date may be approximate).  Assessment:   This is a routine wellness examination for Keenya.  I connected with Roselynn today by telephone and verified that I am speaking with the correct person using two identifiers. Location patient: home Location provider: work Persons participating in the virtual visit: patient, Marine scientist.    I discussed the limitations, risks, security and privacy concerns of performing an evaluation and management service by telephone and the availability of in person appointments. The patient expressed understanding and verbally consented to this telephonic visit.    Interactive audio and video telecommunications were attempted between this provider and patient, however failed, due to patient having technical difficulties OR patient did not have access to video capability.  We continued and completed visit with audio only.  Some vital signs may be absent or patient reported.   Hearing/Vision screen  Hearing Screening   125Hz  250Hz  500Hz  1000Hz  2000Hz  3000Hz  4000Hz  6000Hz  8000Hz   Right ear:           Left ear:           Comments: Patient is able to hear conversational tones without difficulty. No issues reported.  Vision Screening Comments: Followed by [facility]  Wears corrective lenses  Annual visits Cataract extraction, bilateral   Dietary issues and exercise activities discussed: Current  Exercise Habits: Home exercise routine, Type of exercise: walking, Time (Minutes): 30, Frequency (Times/Week): 7, Weekly Exercise (Minutes/Week): 210, Intensity: Mild  Goals: Follow up with pcp as needed.   Depression Screen PHQ 2/9 Scores 04/02/2020 02/17/2020 01/04/2019 07/09/2018 12/31/2017 12/14/2017 12/30/2016  PHQ - 2 Score 0 0 1 0 0 0 0  PHQ- 9 Score - - - 2 - - 0    Fall Risk Fall Risk  04/02/2020 01/23/2020 08/11/2019 01/04/2019 07/09/2018  Falls in the past year? 0 0 0 1 0  Number falls in past yr: 0 - - 1 0  Injury with Fall? 0 - - 1 -  Comment - - - Leg muscle strain. Followed by pcp. -  Risk for fall due to : - - History of fall(s) - -  Follow up Falls evaluation completed - Falls evaluation completed - Falls evaluation completed    [place]: Handrails in use when climbing stairs? Yes Home free of loose throw rugs in walkways, pet beds, electrical cords, etc? Yes  Adequate lighting in your home to reduce risk of falls? Yes   ASSISTIVE DEVICES UTILIZED TO PREVENT FALLS: Life alert? No  Use of a cane, walker or w/c? No  Grab bars in the bathroom? Yes  Shower chair or bench in shower? No  Elevated toilet seat or a handicapped toilet? No   TIMED UP AND GO: Was the test performed? No . Virtual visit.   Cognitive Function: Patient is alert  Followed by Neurology. Last appointment 03/30/20.  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   MMSE - Mini Mental State Exam 12/30/2016  Orientation to time 5  Orientation to Place 5  Registration 3  Attention/ Calculation 5  Recall 3  Language- name 2 objects 2  Language- repeat 1  Language- follow 3 step command 3  Language- read & follow direction 1  Write a sentence 1  Copy design 1  Total score 30     6CIT Screen 01/04/2019 12/31/2017  What Year? 0 points 0 points  What month? 0 points 0 points  What time? 0 points 0 points  Count back  from 20  0 points 0 points  Months in reverse 0 points 0 points  Repeat phrase 0 points 0 points  Total Score 0 0    Immunizations Immunization History  Administered Date(s) Administered  . Influenza Split 12/20/2013  . Influenza,inj,Quad PF,6+ Mos 01/31/2013, 12/31/2017, 01/04/2019  . Influenza-Unspecified 12/01/2013, 12/13/2014, 12/23/2016  . Tdap 08/11/2017, 04/14/2018   Health Maintenance Health Maintenance  Topic Date Due  . MAMMOGRAM  02/05/2025 (Originally 03/09/2014)  . TETANUS/TDAP  04/14/2028  . DEXA SCAN  Completed  . INFLUENZA VACCINE  Discontinued  . COVID-19 Vaccine  Discontinued  . PNA vac Low Risk Adult  Discontinued    Colorectal cancer screening: No longer required.    Mammogram- 2015. Declines ongoing.   Bone density-09/09/18. Osteoporosis.   Lung Cancer Screening: (Low Dose CT Chest recommended if Age 60-80 years, 30 pack-year currently smoking OR have quit w/in 15years.) does not qualify.   Hepatitis C Screening: does not qualify.  Vision Screening: Recommended annual ophthalmology exams for early detection of glaucoma and other disorders of the eye. Is the patient up to date with their annual eye exam?  Yes   Dental Screening: Recommended annual dental exams for proper oral hygiene  Community Resource Referral / Chronic Care Management: CRR required this visit?  No   CCM required this visit?  No      Plan:   Keep all routine maintenance appointments.   Follow up 05/21/20 @ 3:00.  I have personally reviewed and noted the following in the patient's chart:   . Medical and social history . Use of alcohol, tobacco or illicit drugs  . Current medications and supplements . Functional ability and status . Nutritional status . Physical activity . Advanced directives . List of other physicians . Hospitalizations, surgeries, and ER visits in previous 12 months . Vitals . Screenings to include cognitive, depression, and falls . Referrals and  appointments  In addition, I have reviewed and discussed with patient certain preventive protocols, quality metrics, and best practice recommendations. A written personalized care plan for preventive services as well as general preventive health recommendations were provided to patient via mychart.     Varney Biles, LPN   [phone]

## 2020-04-02 NOTE — Patient Instructions (Addendum)
Beth Espinoza , Thank you for taking time to come for your Medicare Wellness Visit. I appreciate your ongoing commitment to your health goals. Please review the following plan we discussed and let me know if I can assist you in the future.   These are the goals we discussed:  Goals: Follow up with pcp as needed.    This is a list of the screening recommended for you and due dates:  Health Maintenance  Topic Date Due  . Mammogram  02/05/2025*  . Tetanus Vaccine  04/14/2028  . DEXA scan (bone density measurement)  Completed  . Flu Shot  Discontinued  . COVID-19 Vaccine  Discontinued  . Pneumonia vaccines  Discontinued  *Topic was postponed. The date shown is not the original due date.     Immunizations Immunization History  Administered Date(s) Administered  . Influenza Split 12/20/2013  . Influenza,inj,Quad PF,6+ Mos 01/31/2013, 12/31/2017, 01/04/2019  . Influenza-Unspecified 12/01/2013, 12/13/2014, 12/23/2016  . Tdap 08/11/2017, 04/14/2018   Advanced directives: End of life planning; Advance aging; Advanced directives discussed.  Copy of current HCPOA/Living Will requested.    Conditions/risks identified: none new  Follow up in one year for your annual wellness visit   Preventive Care 65 Years and Older, Female Preventive care refers to lifestyle choices and visits with your health care provider that can promote health and wellness. What does preventive care include?  A yearly physical exam. This is also called an annual well check.  Dental exams once or twice a year.  Routine eye exams. Ask your health care provider how often you should have your eyes checked.  Personal lifestyle choices, including:  Daily care of your teeth and gums.  Regular physical activity.  Eating a healthy diet.  Avoiding tobacco and drug use.  Limiting alcohol use.  Practicing safe sex.  Taking low-dose aspirin every day.  Taking vitamin and mineral supplements as recommended by your  health care provider. What happens during an annual well check? The services and screenings done by your health care provider during your annual well check will depend on your age, overall health, lifestyle risk factors, and family history of disease. Counseling  Your health care provider may ask you questions about your:  Alcohol use.  Tobacco use.  Drug use.  Emotional well-being.  Home and relationship well-being.  Sexual activity.  Eating habits.  History of falls.  Memory and ability to understand (cognition).  Work and work Statistician.  Reproductive health. Screening  You may have the following tests or measurements:  Height, weight, and BMI.  Blood pressure.  Lipid and cholesterol levels. These may be checked every 5 years, or more frequently if you are over 3 years old.  Skin check.  Lung cancer screening. You may have this screening every year starting at age 79 if you have a 30-pack-year history of smoking and currently smoke or have quit within the past 15 years.  Fecal occult blood test (FOBT) of the stool. You may have this test every year starting at age 73.  Flexible sigmoidoscopy or colonoscopy. You may have a sigmoidoscopy every 5 years or a colonoscopy every 10 years starting at age 52.  Hepatitis C blood test.  Hepatitis B blood test.  Sexually transmitted disease (STD) testing.  Diabetes screening. This is done by checking your blood sugar (glucose) after you have not eaten for a while (fasting). You may have this done every 1-3 years.  Bone density scan. This is done to screen for osteoporosis.  You may have this done starting at age 48.  Mammogram. This may be done every 1-2 years. Talk to your health care provider about how often you should have regular mammograms. Talk with your health care provider about your test results, treatment options, and if necessary, the need for more tests. Vaccines  Your health care provider may recommend  certain vaccines, such as:  Influenza vaccine. This is recommended every year.  Tetanus, diphtheria, and acellular pertussis (Tdap, Td) vaccine. You may need a Td booster every 10 years.  Zoster vaccine. You may need this after age 13.  Pneumococcal 13-valent conjugate (PCV13) vaccine. One dose is recommended after age 59.  Pneumococcal polysaccharide (PPSV23) vaccine. One dose is recommended after age 6. Talk to your health care provider about which screenings and vaccines you need and how often you need them. This information is not intended to replace advice given to you by your health care provider. Make sure you discuss any questions you have with your health care provider. Document Released: 03/16/2015 Document Revised: 11/07/2015 Document Reviewed: 12/19/2014 Elsevier Interactive Patient Education  2017 [place] Prevention in the Home Falls can cause injuries. They can happen to people of all ages. There are many things you can do to make your home safe and to help prevent falls. What can I do on the outside of my home?  Regularly fix the edges of walkways and driveways and fix any cracks.  Remove anything that might make you trip as you walk through a door, such as a raised step or threshold.  Trim any bushes or trees on the path to your home.  Use bright outdoor lighting.  Clear any walking paths of anything that might make someone trip, such as rocks or tools.  Regularly check to see if handrails are loose or broken. Make sure that both sides of any steps have handrails.  Any raised decks and porches should have guardrails on the edges.  Have any leaves, snow, or ice cleared regularly.  Use sand or salt on walking paths during winter.  Clean up any spills in your garage right away. This includes oil or grease spills. What can I do in the bathroom?  Use night lights.  Install grab bars by the toilet and in the tub and shower. Do not use towel bars as  grab bars.  Use non-skid mats or decals in the tub or shower.  If you need to sit down in the shower, use a plastic, non-slip stool.  Keep the floor dry. Clean up any water that spills on the floor as soon as it happens.  Remove soap buildup in the tub or shower regularly.  Attach bath mats securely with double-sided non-slip rug tape.  Do not have throw rugs and other things on the floor that can make you trip. What can I do in the bedroom?  Use night lights.  Make sure that you have a light by your bed that is easy to reach.  Do not use any sheets or blankets that are too big for your bed. They should not hang down onto the floor.  Have a firm chair that has side arms. You can use this for support while you get dressed.  Do not have throw rugs and other things on the floor that can make you trip. What can I do in the kitchen?  Clean up any spills right away.  Avoid walking on wet floors.  Keep items that you use a  lot in easy-to-reach places.  If you need to reach something above you, use a strong step stool that has a grab bar.  Keep electrical cords out of the way.  Do not use floor polish or wax that makes floors slippery. If you must use wax, use non-skid floor wax.  Do not have throw rugs and other things on the floor that can make you trip. What can I do with my stairs?  Do not leave any items on the stairs.  Make sure that there are handrails on both sides of the stairs and use them. Fix handrails that are broken or loose. Make sure that handrails are as long as the stairways.  Check any carpeting to make sure that it is firmly attached to the stairs. Fix any carpet that is loose or worn.  Avoid having throw rugs at the top or bottom of the stairs. If you do have throw rugs, attach them to the floor with carpet tape.  Make sure that you have a light switch at the top of the stairs and the bottom of the stairs. If you do not have them, ask someone to add them  for you. What else can I do to help prevent falls?  Wear shoes that:  Do not have high heels.  Have rubber bottoms.  Are comfortable and fit you well.  Are closed at the toe. Do not wear sandals.  If you use a stepladder:  Make sure that it is fully opened. Do not climb a closed stepladder.  Make sure that both sides of the stepladder are locked into place.  Ask someone to hold it for you, if possible.  Clearly mark and make sure that you can see:  Any grab bars or handrails.  First and last steps.  Where the edge of each step is.  Use tools that help you move around (mobility aids) if they are needed. These include:  Canes.  Walkers.  Scooters.  Crutches.  Turn on the lights when you go into a dark area. Replace any light bulbs as soon as they burn out.  Set up your furniture so you have a clear path. Avoid moving your furniture around.  If any of your floors are uneven, fix them.  If there are any pets around you, be aware of where they are.  Review your medicines with your doctor. Some medicines can make you feel dizzy. This can increase your chance of falling. Ask your doctor what other things that you can do to help prevent falls. This information is not intended to replace advice given to you by your health care provider. Make sure you discuss any questions you have with your health care provider. Document Released: 12/14/2008 Document Revised: 07/26/2015 Document Reviewed: 03/24/2014 Elsevier Interactive Patient Education  2017 [employer].

## 2020-04-04 ENCOUNTER — Encounter: Payer: Self-pay | Admitting: *Deleted

## 2020-04-16 ENCOUNTER — Other Ambulatory Visit: Payer: Self-pay | Admitting: Internal Medicine

## 2020-04-16 NOTE — Telephone Encounter (Signed)
rx ok'd for xanax #30 with one refill  

## 2020-05-07 ENCOUNTER — Other Ambulatory Visit: Payer: Self-pay | Admitting: Internal Medicine

## 2020-05-15 ENCOUNTER — Telehealth (INDEPENDENT_AMBULATORY_CARE_PROVIDER_SITE_OTHER): Payer: PPO | Admitting: Internal Medicine

## 2020-05-15 DIAGNOSIS — F0280 Dementia in other diseases classified elsewhere without behavioral disturbance: Secondary | ICD-10-CM | POA: Diagnosis not present

## 2020-05-15 DIAGNOSIS — I10 Essential (primary) hypertension: Secondary | ICD-10-CM | POA: Diagnosis not present

## 2020-05-15 DIAGNOSIS — G309 Alzheimer's disease, unspecified: Secondary | ICD-10-CM

## 2020-05-15 DIAGNOSIS — G4750 Parasomnia, unspecified: Secondary | ICD-10-CM | POA: Diagnosis not present

## 2020-05-15 DIAGNOSIS — M810 Age-related osteoporosis without current pathological fracture: Secondary | ICD-10-CM | POA: Diagnosis not present

## 2020-05-15 DIAGNOSIS — F0150 Vascular dementia without behavioral disturbance: Secondary | ICD-10-CM

## 2020-05-15 DIAGNOSIS — I712 Thoracic aortic aneurysm, without rupture: Secondary | ICD-10-CM

## 2020-05-15 DIAGNOSIS — M353 Polymyalgia rheumatica: Secondary | ICD-10-CM

## 2020-05-15 DIAGNOSIS — E7800 Pure hypercholesterolemia, unspecified: Secondary | ICD-10-CM | POA: Diagnosis not present

## 2020-05-15 DIAGNOSIS — F419 Anxiety disorder, unspecified: Secondary | ICD-10-CM | POA: Diagnosis not present

## 2020-05-15 DIAGNOSIS — R519 Headache, unspecified: Secondary | ICD-10-CM

## 2020-05-15 DIAGNOSIS — I7121 Aneurysm of the ascending aorta, without rupture: Secondary | ICD-10-CM

## 2020-05-15 NOTE — Progress Notes (Signed)
[name], female   DOB: 1935/11/28, 85 y.o.   MRN: [id number]   Virtual Visit via telephone Note  This visit type was conducted due to national recommendations for restrictions regarding the COVID-19 pandemic (e.g. social distancing).  This format is felt to be most appropriate for this patient at this time.  All issues noted in this document were discussed and addressed.  No physical exam was performed (except for noted visual exam findings with Video Visits).   I connected with [name] by telephone and verified that I am speaking with the correct person using two identifiers. Location patient: home Location provider: work  Persons participating in the telephone visit: patient, provider  The limitations, risks, security and privacy concerns of performing an evaluation and management service by telephone and the availability of in person appointments have been discussed.  It has also been discussed with the patient that there may be a patient responsible charge related to this service. The patient expressed understanding and agreed to proceed.   Reason for visit: scheduled follow up  HPI: Follow up regarding blood pressure and arthritis.  She was scheduled to come into the office today.  Changed to telephone visit.  She reports she feels good.  Stays active.  No chest or sob with increased activity or exertion.  No cough or congestion.  No acid reflux.  No abdominal pain.  Bowels moving. Has seen neurology - on magnesium - no headache now.  No dizziness or light headedness.  Taking plavix.  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  Was instructed to start seroquel.  Has not started.  Reports her son had heart surgery today.  States surgery went well.  Reports daughter - shoulder surgery today.  Handling stress.  Overall feels good.     ROS: See pertinent positives and negatives per HPI.  Past Medical History:  Diagnosis Date  . Anxiety   .  Arthritis   . Cataract   . History of chicken pox   . History of kidney stones October 2013  . Hx: UTI (urinary tract infection)   . Hyperlipidemia   . Hypertension   . Rheumatoid arthritis ([place])   . Stroke [facility])    tia    Past Surgical History:  Procedure Laterality Date  . ABDOMINAL HYSTERECTOMY    . BREAST BIOPSY Right 1995  . CATARACT EXTRACTION W/PHACO Left 04/13/2018   Procedure: CATARACT EXTRACTION PHACO AND INTRAOCULAR LENS PLACEMENT ([place]) Left Eye;  Surgeon: Birder Robson, MD;  Location: [facility] ORS;  Service: Ophthalmology;  Laterality: Left;  [place]  00:47.0 CDE 5.52 Fluid pack lot # [id number]  . EYE SURGERY    . IR ANGIO INTRA EXTRACRAN SEL COM CAROTID INNOMINATE BILAT MOD SED  07/18/2016  . IR ANGIO VERTEBRAL SEL VERTEBRAL BILAT MOD SED  07/18/2016  . PARTIAL HYSTERECTOMY  1972  . TONSILLECTOMY AND ADENOIDECTOMY  1945    Family History  Problem Relation Age of Onset  . Asthma Mother   . Diabetes Mother   . Cataracts Mother   . Heart disease Father   . Diabetes Father   . Hypertension Father   . Cancer Paternal Aunt        Liver  . Diabetes Paternal Grandmother   . Diabetes Sister   . Retinal degeneration Sister   . Glaucoma Sister     SOCIAL HX: reviewed.    Current Outpatient Medications:  .  ALPRAZolam (XANAX) 0.5 MG tablet, Take 1 tablet (  0.5 mg total) by mouth daily as needed for anxiety., Disp: 30 tablet, Rfl: 1 .  amLODipine (NORVASC) 5 MG tablet, Take 1 tablet (5 mg total) by mouth daily., Disp: 90 tablet, Rfl: 0 .  ascorbic acid (VITAMIN C) 1000 MG tablet, Take 1,000 mg by mouth daily., Disp: , Rfl:  .  atenolol (TENORMIN) 25 MG tablet, Take 1 tablet (25 mg total) by mouth daily. Must keep appt in June for additional refills, Disp: 90 tablet, Rfl: 0 .  cholecalciferol (VITAMIN D) 25 MCG tablet, Take 5 tablets (5,000 Units total) by mouth daily., Disp: 30 tablet, Rfl: 1 .  clopidogrel (PLAVIX) 75 MG tablet, Take 1 tablet (75 mg total) by mouth daily.,  Disp: 90 tablet, Rfl: 0 .  folic acid (FOLVITE) 1 MG tablet, Take 2 mg by mouth daily. , Disp: , Rfl:  .  magnesium oxide (MAG-OX) 400 MG tablet, Take by mouth., Disp: , Rfl:  .  methotrexate (RHEUMATREX) 2.5 MG tablet, Take 6 tablets per week. (Patient taking differently: Take 12.5 mg by mouth every Saturday. ), Disp: 4 tablet, Rfl: 0 .  PARoxetine (PAXIL) 20 MG tablet, Take 1 tablet (20 mg total) by mouth daily., Disp: 90 tablet, Rfl: 0 .  vitamin E 180 MG (400 UNITS) capsule, Take by mouth., Disp: , Rfl:   EXAM:  GENERAL: alert. Sounds to be in no acute distress.  Answering questions appropriately.   PSYCH/NEURO: pleasant and cooperative, no obvious depression or anxiety, speech and thought processing grossly intact  ASSESSMENT AND PLAN:  Discussed the following assessment and plan:  Problem List Items Addressed This Visit    Anxiety    Continues on paxil.  Discussed tapering xanax.  Decrease xanax to 1/2 tablet prn.  Follow.       Ascending aortic aneurysm (HCC)    Saw Dr Genevive Bi 01/2020.  Stable.  Recommended f/u in one year.        Essential hypertension    Continue amlodipine.  Follow blood pressure.  Follow metabolic panel.       Relevant Orders   Basic metabolic panel   Exploding head syndrome    Saw neurology.  No further episodes.  Follow.       Headache    On magnesium.  No headache now.  Follow.       Hypercholesterolemia    Intolerance to zetia and crestor. Declines cholesterol medication.  Follow lipid panel.  Low cholesterol diet and exercise.        Relevant Orders   Hepatic function panel   Lipid panel   Mixed Alzheimer's and vascular dementia ([place])    Saw neurology.   Note reviewed.  Recommended to start seroquel.  Never started.  D/w neurology.        Osteoporosis    Had reaction to reclast.  Calcium, vitamin D and weight bearing exercise.       Relevant Medications   vitamin E 180 MG (400 UNITS) capsule   Polymyalgia rheumatica (HCC)     Inflammatory arthritis/PMR presentation.  Stable.  On MTX.           I discussed the assessment and treatment plan with the patient. The patient was provided an opportunity to ask questions and all were answered. The patient agreed with the plan and demonstrated an understanding of the instructions.   The patient was advised to call back or seek an in-person evaluation if the symptoms worsen or if the condition fails to improve as anticipated.  I provided 23 minutes of non-face-to-face time during this encounter.   [name], MD

## 2020-05-16 ENCOUNTER — Encounter: Payer: Self-pay | Admitting: Internal Medicine

## 2020-05-16 DIAGNOSIS — F0280 Dementia in other diseases classified elsewhere without behavioral disturbance: Secondary | ICD-10-CM | POA: Insufficient documentation

## 2020-05-16 DIAGNOSIS — F0150 Vascular dementia without behavioral disturbance: Secondary | ICD-10-CM | POA: Insufficient documentation

## 2020-05-16 DIAGNOSIS — G309 Alzheimer's disease, unspecified: Secondary | ICD-10-CM | POA: Insufficient documentation

## 2020-05-16 NOTE — Assessment & Plan Note (Signed)
On magnesium.  No headache now.  Follow.

## 2020-05-16 NOTE — Assessment & Plan Note (Signed)
Continue amlodipine.  Follow blood pressure.  Follow metabolic panel.

## 2020-05-16 NOTE — Assessment & Plan Note (Signed)
Inflammatory arthritis/PMR presentation.  Stable.  On MTX.  

## 2020-05-16 NOTE — Assessment & Plan Note (Signed)
Had reaction to reclast.  Calcium, vitamin D and weight bearing exercise.  

## 2020-05-16 NOTE — Assessment & Plan Note (Signed)
Continues on paxil.  Discussed tapering xanax.  Decrease xanax to 1/2 tablet prn.  Follow.

## 2020-05-16 NOTE — Assessment & Plan Note (Signed)
Saw neurology.  No further episodes.  Follow.

## 2020-05-16 NOTE — Assessment & Plan Note (Signed)
Saw neurology.   Note reviewed.  Recommended to start seroquel.  Never started.  D/w neurology.

## 2020-05-16 NOTE — Assessment & Plan Note (Signed)
Saw Dr Genevive Bi 01/2020.  Stable.  Recommended f/u in one year.

## 2020-05-16 NOTE — Assessment & Plan Note (Signed)
Intolerance to zetia and crestor. Declines cholesterol medication.  Follow lipid panel.  Low cholesterol diet and exercise.   

## 2020-05-17 ENCOUNTER — Other Ambulatory Visit: Payer: Self-pay | Admitting: Internal Medicine

## 2020-05-21 ENCOUNTER — Ambulatory Visit: Payer: PPO | Admitting: Internal Medicine

## 2020-06-06 ENCOUNTER — Other Ambulatory Visit: Payer: PPO

## 2020-06-12 ENCOUNTER — Other Ambulatory Visit: Payer: Self-pay | Admitting: Internal Medicine

## 2020-06-12 NOTE — Telephone Encounter (Signed)
Last refill on 04/17/20 with 2 refills number 30, Patient last OV 05/15/20 ok to fill?

## 2020-06-12 NOTE — Telephone Encounter (Signed)
rx ok'd for xanax #30 with one refill

## 2020-06-28 ENCOUNTER — Other Ambulatory Visit: Payer: Self-pay | Admitting: Internal Medicine

## 2020-07-06 ENCOUNTER — Other Ambulatory Visit: Payer: Self-pay | Admitting: Internal Medicine

## 2020-08-13 ENCOUNTER — Other Ambulatory Visit: Payer: Self-pay | Admitting: Internal Medicine

## 2020-08-13 NOTE — Telephone Encounter (Signed)
I have refilled xanax.  She needs a f/u appt/physical scheduled.   Per last note, was to be scheduled for physical.

## 2020-08-17 ENCOUNTER — Other Ambulatory Visit: Payer: Self-pay | Admitting: Internal Medicine

## 2020-08-27 NOTE — Telephone Encounter (Signed)
Spoke with pt and scheduled her an appt for the next available in August for her physical. Pt stated that she has stopped taking her xanax and has been doing good without. Pt also wanted to let you know that she is "not fond" of the neurologist that she is seeing. She is wanting to know if there is another Neurologist that you recommend for her to see.

## 2020-09-05 NOTE — Telephone Encounter (Signed)
LMTCB

## 2020-09-07 ENCOUNTER — Other Ambulatory Visit: Payer: Self-pay

## 2020-09-07 ENCOUNTER — Ambulatory Visit
Admission: EM | Admit: 2020-09-07 | Discharge: 2020-09-07 | Disposition: A | Discharge status: Home / Self Care | Payer: PPO | Attending: Medical Oncology | Admitting: Medical Oncology

## 2020-09-07 ENCOUNTER — Encounter: Payer: Self-pay | Admitting: Internal Medicine

## 2020-09-07 ENCOUNTER — Encounter: Payer: Self-pay | Admitting: Emergency Medicine

## 2020-09-07 DIAGNOSIS — M79674 Pain in right toe(s): Secondary | ICD-10-CM

## 2020-09-07 DIAGNOSIS — L03039 Cellulitis of unspecified toe: Secondary | ICD-10-CM

## 2020-09-07 MED ORDER — DOXYCYCLINE HYCLATE 100 MG PO CAPS: 100.0000 mg | ORAL_CAPSULE | Freq: Two times a day (BID) | ORAL | 0 refills | Status: DC

## 2020-09-07 NOTE — ED Triage Notes (Signed)
Patient c/o left foot pain, swelling and bruising that started a week ago.  Patient states that she did not injure her foot.  Patient states that she did walk bare foot in her garden prior to this.

## 2020-09-07 NOTE — ED Provider Notes (Signed)
[facility]    CSN: [id number] Arrival date & time: 09/07/20  1700      History   Chief Complaint Chief Complaint  Patient presents with   Foot Pain    left    HPI Beth Espinoza is a 85 y.o. female.   HPI  Foot Discoloration: Patient reports that about a week ago she was walking in her garden without shoes on.  Later she noticed that her left great toe and second digit looked a bit swollen and red.  She has been soaking in Epsom salt but symptoms have not improved.  She denies any worsening of symptoms, fever or joint stiffness.  She is concerned about cellulitis as previously she had cellulitis that appeared very similar in nature after walking barefoot in her garden again.  She denies any warmth changes, neurological changes or discharge from the area. 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   Headache 02/01/2020   Chest pain 01/28/2019   Ostealgia 01/16/2019   Generalized pain 01/16/2019   Senile osteoporosis 01/14/2019   Memory change 10/16/2018   Osteoporosis 10/11/2018   Fall 08/19/2017   Ascending aortic aneurysm ([place]) 01/06/2017   Hx of migraines 12/12/2016   Pseudophakia of right eye 10/07/2016   Carotid stenosis 07/24/2015   Ectatic thoracic aorta ([place]) 07/24/2015   TIA (transient ischemic attack) 07/20/2015   Dysphagia 06/10/2015   Essential hypertension 02/11/2015   Health care maintenance 04/09/2014   Difficulty sleeping 01/25/2014   Left carotid bruit 01/25/2014   Polymyalgia rheumatica ([place]) 07/12/2013   Epiretinal membrane (ERM) of left eye 11/29/2012   Nuclear  sclerotic cataract of both eyes 11/29/2012   ERM OD (epiretinal membrane, right eye) 11/04/2012   Hypercholesterolemia 06/25/2012   History of colonic polyps 06/25/2012   Anxiety 06/25/2012   Nephrolithiasis 06/25/2012    Past Surgical History:  Procedure Laterality Date   ABDOMINAL HYSTERECTOMY     BREAST BIOPSY Right 1995   CATARACT EXTRACTION W/PHACO Left 04/13/2018   Procedure: CATARACT EXTRACTION PHACO AND INTRAOCULAR LENS PLACEMENT ([place]) Left Eye;  Surgeon: Birder Robson, MD;  Location: [facility] ORS;  Service: Ophthalmology;  Laterality: Left;  [place]  00:47.0 CDE 5.52 Fluid pack lot # [id number]   EYE SURGERY     IR ANGIO INTRA EXTRACRAN SEL COM CAROTID INNOMINATE BILAT MOD SED  07/18/2016   IR ANGIO VERTEBRAL SEL VERTEBRAL BILAT MOD SED  07/18/2016   PARTIAL HYSTERECTOMY  1972   TONSILLECTOMY AND ADENOIDECTOMY  1945    OB History   No obstetric history on file.      Home Medications    Prior to Admission medications   Medication Sig Start Date End Date Taking? Authorizing Provider  amLODipine (NORVASC) 5 MG tablet Take 1 tablet (5 mg total) by mouth daily. 08/17/20  Yes [name], MD  ascorbic acid (VITAMIN C) 1000 MG tablet Take 1,000 mg by mouth daily.   Yes [provider]  atenolol (TENORMIN) 25 MG tablet Take 1 tablet (25 mg total) by  mouth daily. Must keep appt in June for additional refills 05/07/20  Yes [name], MD  cholecalciferol (VITAMIN D) 25 MCG tablet Take 5 tablets (5,000 Units total) by mouth daily. 01/18/19  Yes Caren Griffins, MD  clopidogrel (PLAVIX) 75 MG tablet Take 1 tablet (75 mg total) by mouth daily. 06/28/20  Yes [name], MD  folic acid (FOLVITE) 1 MG tablet Take 2 mg by mouth daily.    Yes [provider]  magnesium oxide (MAG-OX) 400 MG tablet Take by mouth.   Yes [provider]  methotrexate (RHEUMATREX) 2.5 MG tablet Take 6 tablets per week. Patient taking differently: Take 12.5 mg by mouth every  Saturday. 04/09/14  Yes [name], MD  PARoxetine (PAXIL) 20 MG tablet Take 1 tablet (20 mg total) by mouth daily. 07/06/20  Yes [name], MD  vitamin E 180 MG (400 UNITS) capsule Take by mouth.   Yes [provider]  ALPRAZolam Duanne Moron) 0.5 MG tablet Take 1 tablet (0.5 mg total) by mouth daily as needed for anxiety. 08/13/20   [name], MD  doxycycline (VIBRAMYCIN) 100 MG capsule Take 1 capsule (100 mg total) by mouth 2 (two) times daily. 09/07/20   Hughie Closs, PA-C    Family History Family History  Problem Relation Age of Onset   Asthma Mother    Diabetes Mother    Cataracts Mother    Heart disease Father    Diabetes Father    Hypertension Father    Cancer Paternal Aunt        Liver   Diabetes Paternal Grandmother    Diabetes Sister    Retinal degeneration Sister    Glaucoma Sister     Social History Social History   Tobacco Use   Smoking status: Never   Smokeless tobacco: Never  Vaping Use   Vaping Use: Unknown  Substance Use Topics   Alcohol use: No    Alcohol/week: 0.0 standard drinks   Drug use: No     Allergies   Pneumovax [pneumococcal polysaccharide vaccine], Pneumococcal vaccine, and Reclast [zoledronic acid]   Review of Systems Review of Systems  As stated above in HPI Physical Exam Triage Vital Signs ED Triage Vitals  Enc Vitals Group     BP 09/07/20 1727 125/67     Pulse Rate 09/07/20 1727 64     Resp 09/07/20 1727 14     Temp 09/07/20 1727 98.2 F (36.8 C)     Temp Source 09/07/20 1727 Oral     SpO2 09/07/20 1727 99 %     Weight 09/07/20 1721 138 lb (62.6 kg)     Height 09/07/20 1721 5\' 5"  (1.651 m)     Head Circumference --      Peak Flow --      Pain Score 09/07/20 1721 5     Pain Loc --      Pain Edu? --      Excl. in [place]? --    No data found.  Updated Vital Signs BP 125/67 (BP Location: Left Arm)   Pulse 64   Temp 98.2 F (36.8 C) (Oral)   Resp 14   Ht 5\' 5"  (1.651 m)   Wt 138 lb (62.6 kg)    SpO2 99%   BMI 22.96 kg/m    Physical Exam Vitals and nursing note reviewed.  Constitutional:      General: She is not in acute distress.    Appearance: Normal appearance. She is not ill-appearing, toxic-appearing  or diaphoretic.  Cardiovascular:     Pulses: Normal pulses.  Musculoskeletal:        General: Swelling (scant of the left 1st and 2nd digit) present. No tenderness. Normal range of motion.  Skin:    General: Skin is warm.     Coloration: Skin is not jaundiced or pale.     Findings: Bruising and erythema present. No rash.  Neurological:     General: No focal deficit present.     Mental Status: She is alert and oriented to person, place, and time.     Sensory: No sensory deficit.     Motor: No weakness.     UC Treatments / Results  Labs (all labs ordered are listed, but only abnormal results are displayed) Labs Reviewed - No data to display  EKG   Radiology No results found.  Procedures Procedures (including critical care time)  Medications Ordered in UC Medications - No data to display  Initial Impression / Assessment and Plan / UC Course  I have reviewed the triage vital signs and the nursing notes.  Pertinent labs & imaging results that were available during my care of the patient were reviewed by me and considered in my medical decision making (see chart for details).     New.  Patient is convinced that this is cellulitis.  She does have some erythema and some mild swelling so we are going to go ahead and treat her for this today however we did discuss red flag symptoms and precautions specially if symptoms worsen or fail to improve with our efforts.  Discussed how to take the antibiotic.  Final Clinical Impressions(s) / UC Diagnoses   Final diagnoses:  Toe pain, right  Paronychia of great toe   Discharge Instructions   None    ED Prescriptions     Medication Sig Dispense Auth. Provider   doxycycline (VIBRAMYCIN) 100 MG capsule  (Status:  Discontinued) Take 1 capsule (100 mg total) by mouth 2 (two) times daily. 20 capsule Tyge Somers M, [place]   doxycycline (VIBRAMYCIN) 100 MG capsule Take 1 capsule (100 mg total) by mouth 2 (two) times daily. 20 capsule Hughie Closs, [place]      PDMP not reviewed this encounter.   Hughie Closs, Hershal Coria 09/07/20 1758

## 2020-09-08 IMAGING — MR MR HEAD W/O CM
9 series · 48 of 48 positions shown · non-contrast
Comparison: Brain MRI and intracranial MRA 04/21/2016 and earlier.

CLINICAL DATA: 83-year-old female with unexplained altered mental
status. Polymyalgia rheumatica on chronic methotrexate.

EXAM:
MRI HEAD WITHOUT CONTRAST
TECHNIQUE: Multiplanar, multiecho pulse sequences of the brain and surrounding
structures were obtained without intravenous contrast.

[Series 2: ax dwi_tracew · axial · 3.0mm · 1.31mm/px · z∈[-96,+57]mm · 8 of 48 slices shown]
[im 1/48]
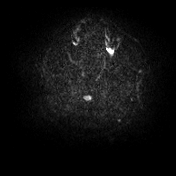
[im 7/48]
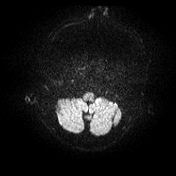
[im 14/48]
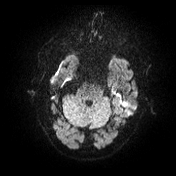
[im 21/48]
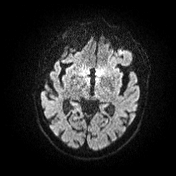
[im 27/48]
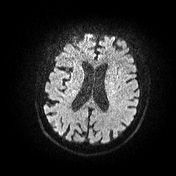
[im 34/48]
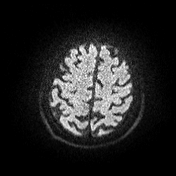
[im 41/48]
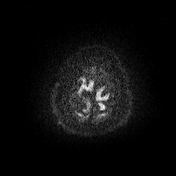
[im 48/48]
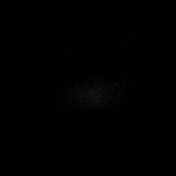

[Series 3: ax dwi_adc · axial · 3.0mm · 1.31mm/px · z∈[-96,+57]mm · 8 of 48 slices shown]
[im 1/48]
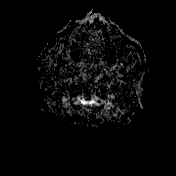
[im 7/48]
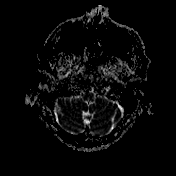
[im 14/48]
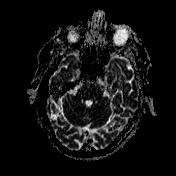
[im 21/48]
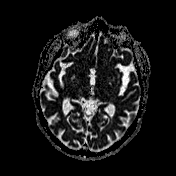
[im 27/48]
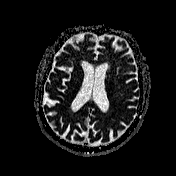
[im 34/48]
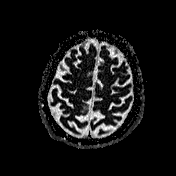
[im 41/48]
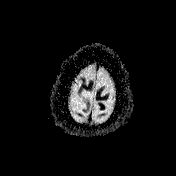
[im 48/48]
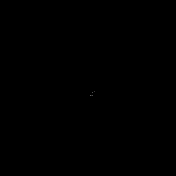

[Series 4: cor dwi_tracew · coronal · 5.0mm · 1.31mm/px · 6 of 38 slices shown]
[im 1/38]
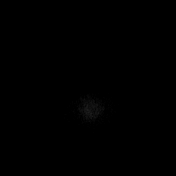
[im 8/38]
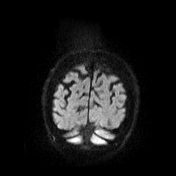
[im 15/38]
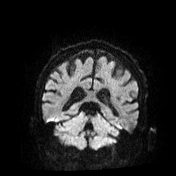
[im 23/38]
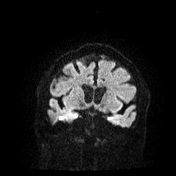
[im 30/38]
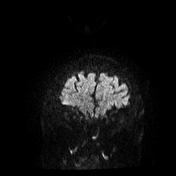
[im 38/38]
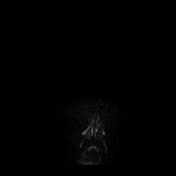

[Series 5: cor dwi_adc · coronal · 5.0mm · 1.31mm/px · 6 of 38 slices shown]
[im 1/38]
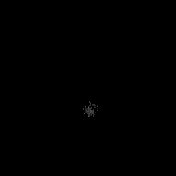
[im 8/38]
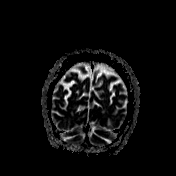
[im 15/38]
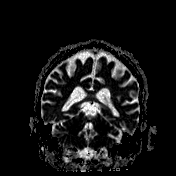
[im 23/38]
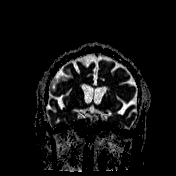
[im 30/38]
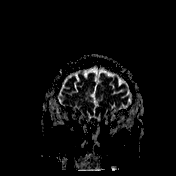
[im 38/38]
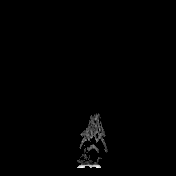

[Series 6: T1 · sagittal · 5.0mm · 0.94mm/px · 4 of 23 slices shown (1 of 2)]
[im 1/23]
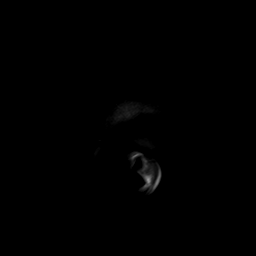
[im 8/23]
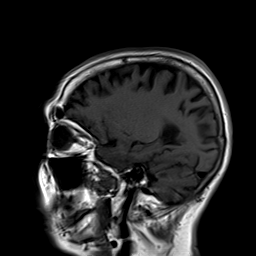
[im 15/23]
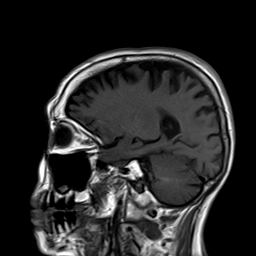
[im 23/23]
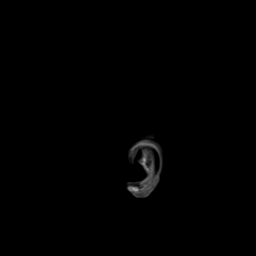

[Series 7: T2 · axial · 5.0mm · 0.45mm/px · z∈[-99,+55]mm · 4 of 27 slices shown]
[im 1/27]
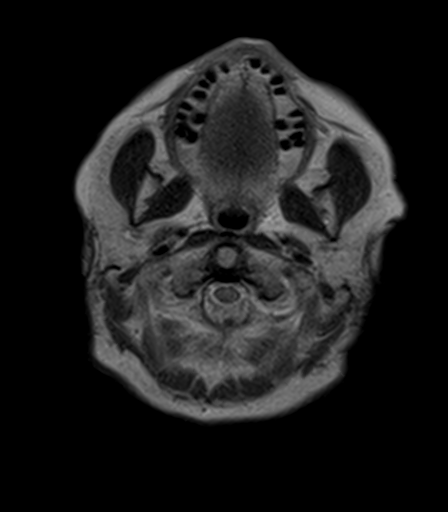
[im 9/27]
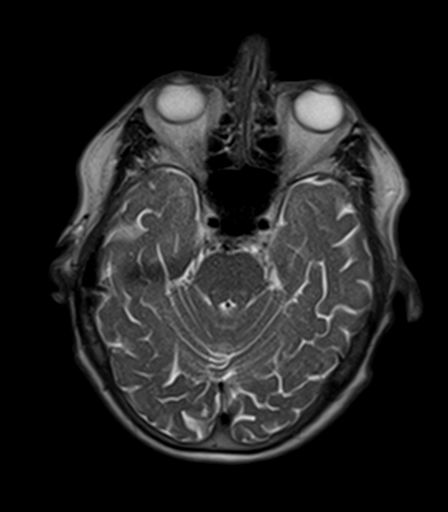
[im 18/27]
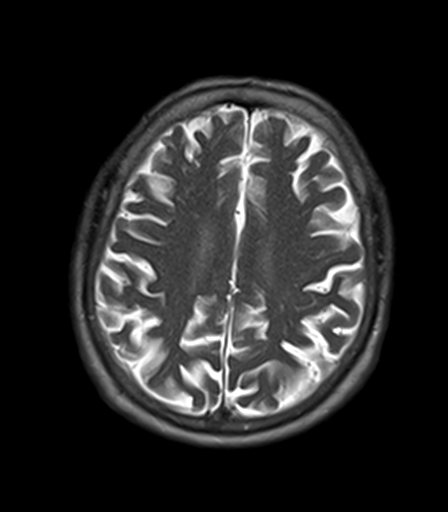
[im 27/27]
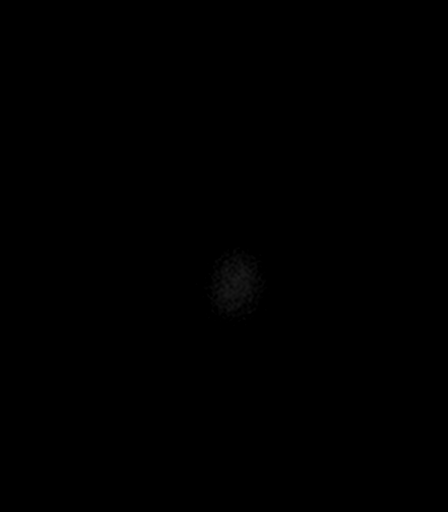

[Series 8: T2-star · axial · 5.0mm · 0.45mm/px · z∈[-99,+55]mm · 4 of 27 slices shown]
[im 1/27]
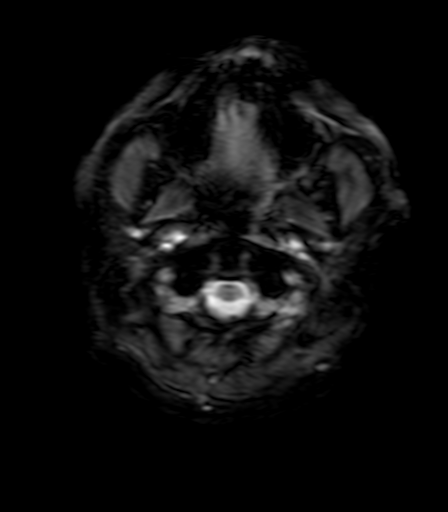
[im 9/27]
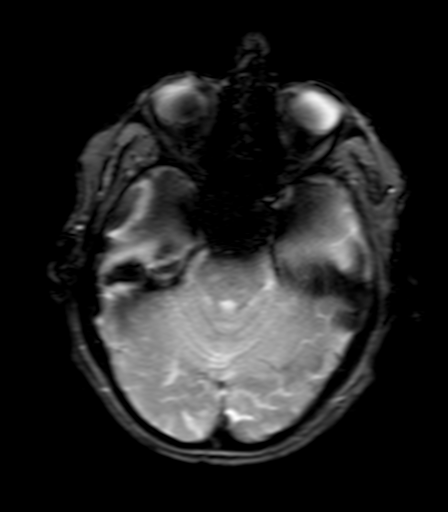
[im 18/27]
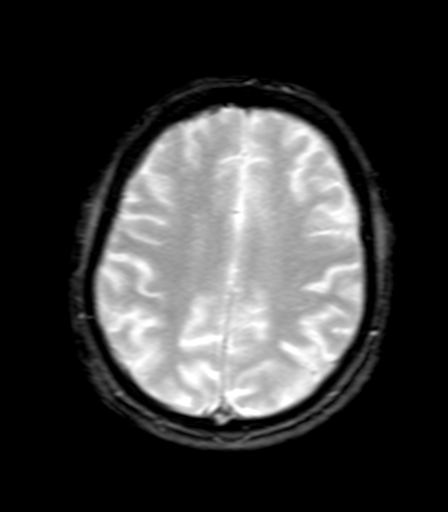
[im 27/27]
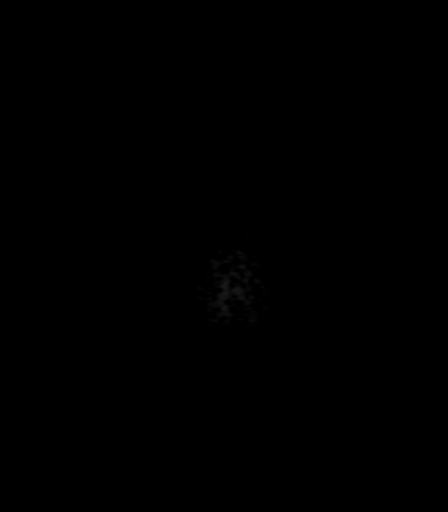

[Series 9: FLAIR · axial · 5.0mm · 1.20mm/px · z∈[-99,+56]mm · 4 of 27 slices shown]
[im 1/27]
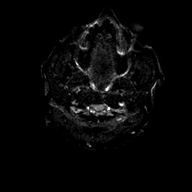
[im 9/27]
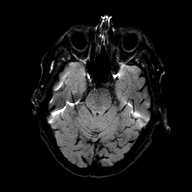
[im 18/27]
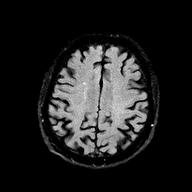
[im 27/27]
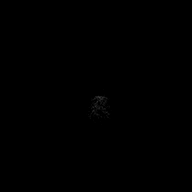

[Series 10: T1 · axial · 5.0mm · 0.90mm/px · z∈[-99,+55]mm · 4 of 27 slices shown (2 of 2)]
[im 1/27]
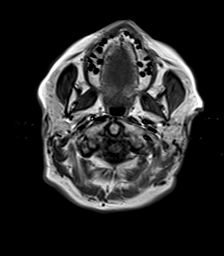
[im 9/27]
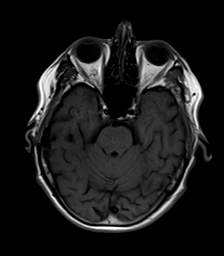
[im 18/27]
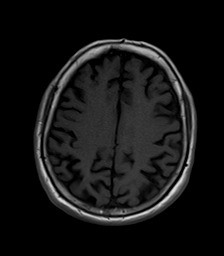
[im 27/27]
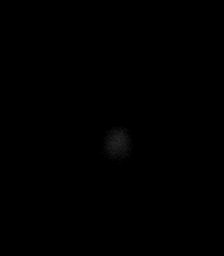

[48 of 48 positions shown; findings below may reference images not displayed]

FINDINGS: Brain: Small chronic infarcts in the bilateral basal ganglia and
right thalamus are stable.

No restricted diffusion to suggest acute infarction. No midline
shift, mass effect, evidence of mass lesion, ventriculomegaly,
extra-axial collection or acute intracranial hemorrhage.
Cervicomedullary junction and pituitary are within normal limits.

Additional scattered mild for age nonspecific cerebral white matter
T2 and FLAIR hyperintensity. No cortical encephalomalacia or chronic
cerebral blood products identified.

Vascular: Major intracranial vascular flow voids appear stable since
8602.

Skull and upper cervical spine: Negative visible cervical spine.
Normal bone marrow signal.

Sinuses/Orbits: Interval postoperative changes to both globes.
Otherwise negative orbits. Paranasal Visualized paranasal sinuses
and mastoids are stable and well pneumatized.

Other: Grossly normal visible internal auditory structures. Trace
retained secretions in the left nasopharynx. Negative scalp and face
soft tissues.
IMPRESSION: 1. No acute intracranial abnormality.
2. Stable non-contrast MRI appearance of the brain since 8602, small
chronic lacunar infarcts in the bilateral basal ganglia and right
thalamus.

## 2020-09-10 ENCOUNTER — Other Ambulatory Visit: Payer: Self-pay | Admitting: Internal Medicine

## 2020-09-10 ENCOUNTER — Telehealth: Payer: Self-pay

## 2020-09-10 NOTE — Telephone Encounter (Signed)
Please advise 

## 2020-09-10 NOTE — Telephone Encounter (Signed)
Please call and confirm she Ms Hallberg is doing ok.  According to daughter, diarrhea has resolved.  Can add probiotic daily.  Let me know if persistent problems.

## 2020-09-10 NOTE — Telephone Encounter (Signed)
Reviewed chart. Apparently she was seen at urgent care for her foot.  Placed on abx.  I do not mind referring her to podiatry, but need to know if anything acute going on with her foot.  If any acute issues, needs to be seen more urgently.  Also, see her my chart message from earlier.  It appears she has not been called.

## 2020-09-10 NOTE — Telephone Encounter (Signed)
Left message for patient to return call back.  

## 2020-09-10 NOTE — Telephone Encounter (Signed)
Pt called into access nurse on 09/07/20 to discuss toe discoloration and swelling and asks if we knew the name of the foot doctor she saw for the issue years ago. Pt was seen in the hospital for toe pain on 09/07/20.

## 2020-09-11 NOTE — Telephone Encounter (Signed)
Spoken to patient, she stated her diarrhea is better and so is her toes. They are still painful but they are better she will wait until she has appointment with PCP.

## 2020-09-11 NOTE — Telephone Encounter (Signed)
Left message to call office

## 2020-09-11 NOTE — Telephone Encounter (Signed)
She has an appt scheduled with me 10/04/20.  Does she need earlier appt.

## 2020-09-11 NOTE — Telephone Encounter (Signed)
Refilled: 08/13/2020 Last OV: 05/15/2020 Next OV: 10/04/2020

## 2020-09-17 NOTE — Telephone Encounter (Signed)
Left message to call office

## 2020-09-19 NOTE — Telephone Encounter (Signed)
Left message to call office

## 2020-09-24 ENCOUNTER — Other Ambulatory Visit: Payer: Self-pay | Admitting: Internal Medicine

## 2020-10-02 ENCOUNTER — Encounter: Payer: Self-pay | Admitting: Internal Medicine

## 2020-10-02 ENCOUNTER — Other Ambulatory Visit: Payer: Self-pay | Admitting: Internal Medicine

## 2020-10-04 ENCOUNTER — Ambulatory Visit: Payer: PPO | Admitting: Internal Medicine

## 2020-10-09 ENCOUNTER — Other Ambulatory Visit: Payer: Self-pay | Admitting: Internal Medicine

## 2020-10-09 NOTE — Telephone Encounter (Signed)
[id number] Refill:Xanax Last Seen:05-15-20 Last ordered:09-12-20

## 2020-10-29 ENCOUNTER — Other Ambulatory Visit: Payer: Self-pay | Admitting: Internal Medicine

## 2020-11-30 ENCOUNTER — Ambulatory Visit (INDEPENDENT_AMBULATORY_CARE_PROVIDER_SITE_OTHER): Payer: PPO | Admitting: Internal Medicine

## 2020-11-30 ENCOUNTER — Other Ambulatory Visit: Payer: Self-pay

## 2020-11-30 DIAGNOSIS — E7800 Pure hypercholesterolemia, unspecified: Secondary | ICD-10-CM

## 2020-12-03 ENCOUNTER — Encounter: Payer: Self-pay | Admitting: Internal Medicine

## 2020-12-03 ENCOUNTER — Other Ambulatory Visit: Payer: Self-pay | Admitting: Internal Medicine

## 2020-12-03 NOTE — Progress Notes (Signed)
Pt checked in and left.  Appt rescheduled.

## 2020-12-10 ENCOUNTER — Ambulatory Visit: Payer: PPO | Admitting: Internal Medicine

## 2020-12-10 ENCOUNTER — Other Ambulatory Visit: Payer: Self-pay | Admitting: Internal Medicine

## 2020-12-10 NOTE — Telephone Encounter (Signed)
RX Refill:xanax Last Seen:11-30-20 Last ordered:10-10-20

## 2020-12-20 ENCOUNTER — Other Ambulatory Visit: Payer: Self-pay | Admitting: Internal Medicine

## 2020-12-31 ENCOUNTER — Other Ambulatory Visit: Payer: Self-pay | Admitting: Internal Medicine

## 2021-01-21 DIAGNOSIS — M1990 Unspecified osteoarthritis, unspecified site: Secondary | ICD-10-CM | POA: Diagnosis not present

## 2021-01-21 DIAGNOSIS — M353 Polymyalgia rheumatica: Secondary | ICD-10-CM | POA: Diagnosis not present

## 2021-02-04 ENCOUNTER — Other Ambulatory Visit: Payer: Self-pay | Admitting: Internal Medicine

## 2021-02-04 NOTE — Telephone Encounter (Signed)
RX Refill: xanax Last Seen: 11-30-20 Last Ordered: 12-10-20 Next Appt: NA

## 2021-02-05 NOTE — Telephone Encounter (Signed)
I have refilled x 1.  I have not seen her since 05/2013. She needs a f/u appt with me prior to next refill.  Cannot continue to refill if no follow up appt.

## 2021-02-28 ENCOUNTER — Telehealth: Payer: Self-pay

## 2021-02-28 ENCOUNTER — Other Ambulatory Visit: Payer: Self-pay | Admitting: Internal Medicine

## 2021-02-28 NOTE — Telephone Encounter (Signed)
Message left for the patient. She is due for a CT of the Chest for follow up of Thoracic Aortic Aneurysm. Dr Genevive Bi is retired and she may have this done through her PCP or we can refer her to [facility].

## 2021-03-11 ENCOUNTER — Other Ambulatory Visit: Payer: Self-pay | Admitting: Internal Medicine

## 2021-03-12 NOTE — Telephone Encounter (Signed)
Last refill 02/05/21 last OV 9/22

## 2021-03-13 ENCOUNTER — Other Ambulatory Visit (INDEPENDENT_AMBULATORY_CARE_PROVIDER_SITE_OTHER): Payer: Self-pay | Admitting: Nurse Practitioner

## 2021-03-13 ENCOUNTER — Other Ambulatory Visit (INDEPENDENT_AMBULATORY_CARE_PROVIDER_SITE_OTHER): Payer: Self-pay | Admitting: Vascular Surgery

## 2021-03-13 DIAGNOSIS — I6523 Occlusion and stenosis of bilateral carotid arteries: Secondary | ICD-10-CM

## 2021-03-13 NOTE — Telephone Encounter (Signed)
Rx sent in for xanax #30 with no refills.  Needs f/u appt scheduled.

## 2021-03-14 ENCOUNTER — Encounter (INDEPENDENT_AMBULATORY_CARE_PROVIDER_SITE_OTHER): Payer: PPO

## 2021-03-14 ENCOUNTER — Ambulatory Visit (INDEPENDENT_AMBULATORY_CARE_PROVIDER_SITE_OTHER): Payer: PPO | Admitting: Nurse Practitioner

## 2021-03-18 ENCOUNTER — Other Ambulatory Visit: Payer: Self-pay | Admitting: Internal Medicine

## 2021-04-03 ENCOUNTER — Other Ambulatory Visit: Payer: Self-pay | Admitting: Internal Medicine

## 2021-04-03 ENCOUNTER — Telehealth: Payer: Self-pay

## 2021-04-03 ENCOUNTER — Ambulatory Visit: Payer: PPO

## 2021-04-03 NOTE — Telephone Encounter (Signed)
No answer, no voicemail available when called for scheduled AWV. Reschedule.

## 2021-04-09 ENCOUNTER — Other Ambulatory Visit: Payer: Self-pay | Admitting: Internal Medicine

## 2021-04-09 NOTE — Telephone Encounter (Signed)
Refilled: 03/13/2021 Last OV: 11/30/2020 Next OV: not scheduled

## 2021-04-10 NOTE — Telephone Encounter (Signed)
Rx ok'd for xanax #30 with no refills. She needs a f/u appt scheduled with me. Please schedule.

## 2021-04-23 ENCOUNTER — Other Ambulatory Visit: Payer: Self-pay | Admitting: Internal Medicine

## 2021-04-23 NOTE — Telephone Encounter (Signed)
Needs to make a f/u appt

## 2021-05-08 ENCOUNTER — Other Ambulatory Visit: Payer: Self-pay | Admitting: Internal Medicine

## 2021-05-09 NOTE — Telephone Encounter (Signed)
I cannot keep refilling medication if I am not seeing her.  She needs an appt, then can refill until appt.   ?

## 2021-05-09 NOTE — Telephone Encounter (Signed)
See note.  Pts daughter states no longer taking.  Has appt scheduled.  ?

## 2021-05-09 NOTE — Telephone Encounter (Signed)
Called patients phone 3 times- busy signal. Called daughter. Per daughter, patient is not taking xanax. Scheduled follow up appt for 3/28 at 2:00. Daughter says she will make her mom a note and try to remind her to come to appt. ?

## 2021-05-27 ENCOUNTER — Other Ambulatory Visit: Payer: Self-pay | Admitting: Internal Medicine

## 2021-05-28 ENCOUNTER — Encounter: Payer: Self-pay | Admitting: Internal Medicine

## 2021-05-28 ENCOUNTER — Ambulatory Visit (INDEPENDENT_AMBULATORY_CARE_PROVIDER_SITE_OTHER): Payer: PPO | Admitting: Internal Medicine

## 2021-05-28 ENCOUNTER — Other Ambulatory Visit: Payer: Self-pay

## 2021-05-28 VITALS — BP 136/68 | HR 70 | Temp 98.0°F | Ht 65.0 in | Wt 139.4 lb

## 2021-05-28 DIAGNOSIS — G309 Alzheimer's disease, unspecified: Secondary | ICD-10-CM

## 2021-05-28 DIAGNOSIS — I77810 Thoracic aortic ectasia: Secondary | ICD-10-CM | POA: Diagnosis not present

## 2021-05-28 DIAGNOSIS — I779 Disorder of arteries and arterioles, unspecified: Secondary | ICD-10-CM | POA: Diagnosis not present

## 2021-05-28 DIAGNOSIS — F419 Anxiety disorder, unspecified: Secondary | ICD-10-CM | POA: Diagnosis not present

## 2021-05-28 DIAGNOSIS — I7121 Aneurysm of the ascending aorta, without rupture: Secondary | ICD-10-CM

## 2021-05-28 DIAGNOSIS — Z0000 Encounter for general adult medical examination without abnormal findings: Secondary | ICD-10-CM

## 2021-05-28 DIAGNOSIS — R739 Hyperglycemia, unspecified: Secondary | ICD-10-CM | POA: Diagnosis not present

## 2021-05-28 DIAGNOSIS — Z860100 Personal history of colon polyps, unspecified: Secondary | ICD-10-CM

## 2021-05-28 DIAGNOSIS — G479 Sleep disorder, unspecified: Secondary | ICD-10-CM

## 2021-05-28 DIAGNOSIS — E7800 Pure hypercholesterolemia, unspecified: Secondary | ICD-10-CM | POA: Diagnosis not present

## 2021-05-28 DIAGNOSIS — I10 Essential (primary) hypertension: Secondary | ICD-10-CM

## 2021-05-28 DIAGNOSIS — M353 Polymyalgia rheumatica: Secondary | ICD-10-CM

## 2021-05-28 DIAGNOSIS — E781 Pure hyperglyceridemia: Secondary | ICD-10-CM

## 2021-05-28 DIAGNOSIS — M810 Age-related osteoporosis without current pathological fracture: Secondary | ICD-10-CM

## 2021-05-28 DIAGNOSIS — F0150 Vascular dementia without behavioral disturbance: Secondary | ICD-10-CM

## 2021-05-28 DIAGNOSIS — F0280 Dementia in other diseases classified elsewhere without behavioral disturbance: Secondary | ICD-10-CM

## 2021-05-28 NOTE — Progress Notes (Signed)
[name], female   DOB: [birth year], 86 y.o.   MRN: [id number] ? ? ?Subjective:  ? ? [name], female    DOB: January 20, 1936, 86 y.o.   MRN: [id number] ? ?This visit occurred during the SARS-CoV-2 public health emergency.  Safety protocols were in place, including screening questions prior to the visit, additional usage of staff PPE, and extensive cleaning of exam room while observing appropriate contact time as indicated for disinfecting solutions.  ? ?Patient here for a scheduled follow up.  ? ?Chief Complaint  ?Patient presents with  ? Follow-up  ?  Overdue f/u - last seen since 05/2020  ? .  ? ?HPI ?Here to follow up regarding her cholesterol and blood pressure.  Reports she is doing relatively well.  Stays active.  No chest pain or sob.  No abdominal pain or bowel change.  Feels she is handling stress relatively well.  She visited a friend last week.  Did not sleep the first night at her house.  Reports most nights she sleeps well.  Not on xanax now.  Sees rheumatology.  On MTX and doing well.   ? ? ?Past Medical History:  ?Diagnosis Date  ? Anxiety   ? Arthritis   ? Cataract   ? History of chicken pox   ? History of kidney stones October 2013  ? Hx: UTI (urinary tract infection)   ? Hyperlipidemia   ? Hypertension   ? Rheumatoid arthritis ([place])   ? Stroke [facility])   ? tia  ? ?Past Surgical History:  ?Procedure Laterality Date  ? ABDOMINAL HYSTERECTOMY    ? BREAST BIOPSY Right 1995  ? CATARACT EXTRACTION W/PHACO Left 04/13/2018  ? Procedure: CATARACT EXTRACTION PHACO AND INTRAOCULAR LENS PLACEMENT ([place]) Left Eye;  Surgeon: Birder Robson, MD;  Location: [facility] ORS;  Service: Ophthalmology;  Laterality: Left;  [place]  00:47.0 ?CDE 5.52 ?Fluid pack lot # [id number]  ? EYE SURGERY    ? IR ANGIO INTRA EXTRACRAN SEL COM CAROTID INNOMINATE BILAT MOD SED  07/18/2016  ? IR ANGIO VERTEBRAL SEL VERTEBRAL BILAT MOD SED  07/18/2016  ? PARTIAL HYSTERECTOMY  1972  ? TONSILLECTOMY AND ADENOIDECTOMY  1945  ? ?Family History   ?Problem Relation Age of Onset  ? Asthma Mother   ? Diabetes Mother   ? Cataracts Mother   ? Heart disease Father   ? Diabetes Father   ? Hypertension Father   ? Cancer Paternal Aunt   ?     Liver  ? Diabetes Paternal Grandmother   ? Diabetes Sister   ? Retinal degeneration Sister   ? Glaucoma Sister   ? ?Social History  ? ?Socioeconomic History  ? Marital status: Married  ?  Spouse name: Not on file  ? Number of children: Not on file  ? Years of education: Not on file  ? Highest education level: Not on file  ?Occupational History  ? Not on file  ?Tobacco Use  ? Smoking status: Never  ? Smokeless tobacco: Never  ?Vaping Use  ? Vaping Use: Unknown  ?Substance and Sexual Activity  ? Alcohol use: No  ?  Alcohol/week: 0.0 standard drinks  ? Drug use: No  ? Sexual activity: Not on file  ?Other Topics Concern  ? Not on file  ?Social History Narrative  ? Not on file  ? ?Social Determinants of Health  ? ?Financial Resource Strain: Not on file  ?Food Insecurity: Not on file  ?Transportation Needs: Not on file  ?  Physical Activity: Not on file  ?Stress: Not on file  ?Social Connections: Not on file  ? ? ? ?Review of Systems  ?Constitutional:  Negative for appetite change and unexpected weight change.  ?HENT:  Negative for congestion and sinus pressure.   ?Respiratory:  Negative for cough, chest tightness and shortness of breath.   ?Cardiovascular:  Negative for chest pain, palpitations and leg swelling.  ?Gastrointestinal:  Negative for abdominal pain, diarrhea, nausea and vomiting.  ?Genitourinary:  Negative for difficulty urinating and dysuria.  ?Musculoskeletal:  Negative for joint swelling and myalgias.  ?Skin:  Negative for color change and rash.  ?Neurological:  Negative for dizziness, light-headedness and headaches.  ?Psychiatric/Behavioral:  Negative for agitation and dysphoric mood.   ? ?   ?Objective:  ?  ? ?BP 136/68 (BP Location: Left Arm, Patient Position: Sitting, Cuff Size: Small)   Pulse 70   Temp 98 ?F  (36.7 ?C) (Oral)   Ht '5\' 5"'$  (1.651 m)   Wt 139 lb 6.4 oz (63.2 kg)   SpO2 97%   BMI 23.20 kg/m?  ?Wt Readings from Last 3 Encounters:  ?05/28/21 139 lb 6.4 oz (63.2 kg)  ?09/07/20 138 lb (62.6 kg)  ?05/15/20 140 lb (63.5 kg)  ? ? ?Physical Exam ?Vitals reviewed.  ?Constitutional:   ?   General: She is not in acute distress. ?   Appearance: Normal appearance.  ?HENT:  ?   Head: Normocephalic and atraumatic.  ?   Right Ear: External ear normal.  ?   Left Ear: External ear normal.  ?Eyes:  ?   General: No scleral icterus.    ?   Right eye: No discharge.     ?   Left eye: No discharge.  ?   Conjunctiva/sclera: Conjunctivae normal.  ?Neck:  ?   Thyroid: No thyromegaly.  ?Cardiovascular:  ?   Rate and Rhythm: Normal rate and regular rhythm.  ?Pulmonary:  ?   Effort: No respiratory distress.  ?   Breath sounds: Normal breath sounds. No wheezing.  ?Abdominal:  ?   General: Bowel sounds are normal.  ?   Palpations: Abdomen is soft.  ?   Tenderness: There is no abdominal tenderness.  ?Musculoskeletal:     ?   General: No swelling or tenderness.  ?   Cervical back: Neck supple. No tenderness.  ?Lymphadenopathy:  ?   Cervical: No cervical adenopathy.  ?Skin: ?   Findings: No erythema or rash.  ?Neurological:  ?   Mental Status: She is alert.  ?Psychiatric:     ?   Mood and Affect: Mood normal.     ?   Behavior: Behavior normal.  ? ? ? ?Outpatient Encounter Medications as of 05/28/2021  ?Medication Sig  ? ALPRAZolam (XANAX) 0.5 MG tablet Take 1 tablet (0.5 mg total) by mouth daily as needed for anxiety.  ? amLODipine (NORVASC) 5 MG tablet Take 1 tablet (5 mg total) by mouth daily.  ? ascorbic acid (VITAMIN C) 1000 MG tablet Take 1,000 mg by mouth daily.  ? atenolol (TENORMIN) 25 MG tablet Take 1 tablet (25 mg total) by mouth daily. Must keep appt in June for additional refills  ? cholecalciferol (VITAMIN D) 25 MCG tablet Take 5 tablets (5,000 Units total) by mouth daily.  ? clopidogrel (PLAVIX) 75 MG tablet Take 1 tablet (75  mg total) by mouth daily.  ? folic acid (FOLVITE) 1 MG tablet Take 2 mg by mouth daily.   ? magnesium oxide (MAG-OX) 400  MG tablet Take by mouth.  ? methotrexate (RHEUMATREX) 2.5 MG tablet Take 6 tablets per week. (Patient taking differently: Take 12.5 mg by mouth every Saturday.)  ? PARoxetine (PAXIL) 20 MG tablet Take 1 tablet (20 mg total) by mouth daily.  ? vitamin E 180 MG (400 UNITS) capsule Take by mouth.  ? [DISCONTINUED] doxycycline (VIBRAMYCIN) 100 MG capsule Take 1 capsule (100 mg total) by mouth 2 (two) times daily.  ? ?No facility-administered encounter medications on file as of 05/28/2021.  ?  ? ?Lab Results  ?Component Value Date  ? WBC 9.4 05/28/2021  ? HGB 13.4 05/28/2021  ? HCT 40.9 05/28/2021  ? PLT 306.0 05/28/2021  ? GLUCOSE 137 (H) 05/28/2021  ? CHOL 254 (H) 05/28/2021  ? TRIG 235.0 (H) 05/28/2021  ? HDL 39.70 05/28/2021  ? LDLDIRECT 181.0 05/28/2021  ? [place] 218 (H) 08/12/2018  ? ALT 11 05/28/2021  ? AST 16 05/28/2021  ? NA 139 05/28/2021  ? K 4.0 05/28/2021  ? CL 105 05/28/2021  ? CREATININE 0.98 05/28/2021  ? BUN 19 05/28/2021  ? CO2 24 05/28/2021  ? TSH 1.69 02/17/2020  ? INR 1.07 07/18/2016  ? HGBA1C 6.1 05/28/2021  ? ? ?No results found. ? ?   ?Assessment & Plan:  ? ?Problem List Items Addressed This Visit   ? ? Anxiety  ?  Continues on paxil.  Has tapered xanax.  Not taking now.  Follow  ?  ?  ? Ascending aortic aneurysm  ?  Saw Dr Genevive Bi 01/2020.  Stable.  Recommended f/u in one year.  Overdue.  ?  ?  ? Relevant Orders  ? Ambulatory referral to Vascular Surgery  ? Carotid artery disease ([place])  ?  Evaluated by [facility] 03/2019.  Stable.  Recommended f/u in 24 months.  Continue plavix.   ?  ?  ? Relevant Orders  ? Ambulatory referral to Vascular Surgery  ? Difficulty sleeping  ?  Reports not an issue now.  Off xanax.  Follow.  ?  ?  ? Ectatic thoracic aorta ([place])  ?  Saw Dr Genevive Bi 01/2020.  Stable.  Recommended f/u in one year.  Appears to be overdue.  Need to arrange f/u.  ?  ?  ? Relevant  Orders  ? Ambulatory referral to Vascular Surgery  ? Essential hypertension  ?  Continue amlodipine and atenolol.   Follow blood pressure.  Follow metabolic panel.  ?  ?  ? Relevant Orders  ? Basic metabolic panel (C

## 2021-05-28 NOTE — Telephone Encounter (Signed)
Please refill if pt shows up for appt today. Pt has a history of no shows. Last OV 3/22 ?

## 2021-05-29 LAB — LIPID PANEL
Cholesterol: 254 mg/dL — ABNORMAL HIGH (ref 0–200)
HDL: 39.7 mg/dL (ref >39.00)
NonHDL: 213.95
Total CHOL/HDL Ratio: 6
Triglycerides: 235 mg/dL — ABNORMAL HIGH (ref 0.0–149.0)
VLDL: 47 mg/dL — ABNORMAL HIGH (ref 0.0–40.0)

## 2021-05-29 LAB — BASIC METABOLIC PANEL
BUN: 19 mg/dL (ref 6–23)
CO2: 24 mEq/L (ref 19–32)
Calcium: 9.6 mg/dL (ref 8.4–10.5)
Chloride: 105 mEq/L (ref 96–112)
Creatinine, Ser: 0.98 mg/dL (ref 0.40–1.20)
GFR: 53.27 mL/min — ABNORMAL LOW (ref >60.00)
Glucose, Bld: 137 mg/dL — ABNORMAL HIGH (ref 70–99)
Potassium: 4 mEq/L (ref 3.5–5.1)
Sodium: 139 mEq/L (ref 135–145)

## 2021-05-29 LAB — HEPATIC FUNCTION PANEL
ALT: 11 U/L (ref 0–35)
AST: 16 U/L (ref 0–37)
Albumin: 4.4 g/dL (ref 3.5–5.2)
Alkaline Phosphatase: 44 U/L (ref 39–117)
Bilirubin, Direct: 0.1 mg/dL (ref 0.0–0.3)
Total Bilirubin: 0.4 mg/dL (ref 0.2–1.2)
Total Protein: 6.7 g/dL (ref 6.0–8.3)

## 2021-05-29 LAB — CBC WITH DIFFERENTIAL/PLATELET
Basophils Absolute: 0.1 10*3/uL (ref 0.0–0.1)
Basophils Relative: 1.3 % (ref 0.0–3.0)
Eosinophils Absolute: 0.3 10*3/uL (ref 0.0–0.7)
Eosinophils Relative: 3.4 % (ref 0.0–5.0)
HCT: 40.9 % (ref 36.0–46.0)
Hemoglobin: 13.4 g/dL (ref 12.0–15.0)
Lymphocytes Relative: 22.5 % (ref 12.0–46.0)
Lymphs Abs: 2.1 10*3/uL (ref 0.7–4.0)
MCHC: 32.9 g/dL (ref 30.0–36.0)
MCV: 90 fl (ref 78.0–100.0)
Monocytes Absolute: 0.6 10*3/uL (ref 0.1–1.0)
Monocytes Relative: 6.9 % (ref 3.0–12.0)
Neutro Abs: 6.2 10*3/uL (ref 1.4–7.7)
Neutrophils Relative %: 65.9 % (ref 43.0–77.0)
Platelets: 306 10*3/uL (ref 150.0–400.0)
RBC: 4.55 Mil/uL (ref 3.87–5.11)
RDW: 14.8 % (ref 11.5–15.5)
WBC: 9.4 10*3/uL (ref 4.0–10.5)

## 2021-05-29 LAB — HEMOGLOBIN A1C: Hgb A1c MFr Bld: 6.1 % (ref 4.6–6.5)

## 2021-05-29 LAB — LDL CHOLESTEROL, DIRECT: Direct LDL: 181 mg/dL

## 2021-05-31 ENCOUNTER — Encounter: Payer: Self-pay | Admitting: Internal Medicine

## 2021-05-31 NOTE — Assessment & Plan Note (Signed)
Intolerance to zetia and crestor. Declines cholesterol medication.  Follow lipid panel.  Low cholesterol diet and exercise.   

## 2021-05-31 NOTE — Assessment & Plan Note (Addendum)
Continue amlodipine and atenolol.   Follow blood pressure.  Follow metabolic panel.  

## 2021-05-31 NOTE — Assessment & Plan Note (Signed)
Saw Dr Genevive Bi 01/2020.  Stable.  Recommended f/u in one year.  Overdue.  ?

## 2021-05-31 NOTE — Assessment & Plan Note (Signed)
Saw neurology.   Note reviewed.  Recommended to start seroquel.  Never started. Feels she is sleeping well.  Follow.  

## 2021-05-31 NOTE — Assessment & Plan Note (Signed)
Evaluated by [facility] 03/2019.  Stable.  Recommended f/u in 24 months.  Continue plavix.   ?

## 2021-05-31 NOTE — Assessment & Plan Note (Signed)
Last colonoscopy 12/2012.  Recommended f/u in 5 years.  Have discussed f/u.  Declines.  ?

## 2021-05-31 NOTE — Assessment & Plan Note (Signed)
Saw Dr Genevive Bi 01/2020.  Stable.  Recommended f/u in one year.  Appears to be overdue.  Need to arrange f/u.  ?

## 2021-05-31 NOTE — Assessment & Plan Note (Signed)
Low carb diet and exercise.  Follow met b and a1c.  ?

## 2021-05-31 NOTE — Assessment & Plan Note (Signed)
Continues on paxil.  Has tapered xanax.  Not taking now.  Follow  

## 2021-05-31 NOTE — Assessment & Plan Note (Signed)
Had reaction to reclast.  Calcium, vitamin D and weight bearing exercise.  

## 2021-05-31 NOTE — Assessment & Plan Note (Signed)
Inflammatory arthritis/PMR presentation.  Stable.  On MTX.  

## 2021-05-31 NOTE — Assessment & Plan Note (Signed)
Reports not an issue now.  Off xanax.  Follow.  ?

## 2021-06-04 ENCOUNTER — Telehealth: Payer: Self-pay

## 2021-06-04 NOTE — Telephone Encounter (Signed)
-----   Message from [name], MD sent at 05/31/2021  3:43 AM EDT ----- ?Notify pt that her cholesterol remains elevated.  Has had problems with zetia and statin medication.  See if agreeable to start an injectable cholesterol medication.  If so, can refer to lipid clinic through cardiology.  Overall sugar control above normal.  Low carb diet and exercise.  We will follow. Kidney function stable.  Hgb and liver function tests wnl.  Also, let her know that I have placed order for referral back to vascular to f/u on her carotids and the aneurysm.  They should be contacting her with an appt.  Let us know if does not hear from them with appt ?

## 2021-06-04 NOTE — Telephone Encounter (Signed)
Attempted to call pt re: results - phone picked up and hung up x 2 ?

## 2021-06-14 ENCOUNTER — Other Ambulatory Visit: Payer: Self-pay | Admitting: Internal Medicine

## 2021-06-14 NOTE — Telephone Encounter (Signed)
Pt daughter called in stating that someone called there home phone and the phone kept hanging up... Pt daughter stated that its ok to reach out to her to give any information or results... Pt is on DPR... Pt daughter requesting callback ?

## 2021-06-17 ENCOUNTER — Telehealth: Payer: Self-pay | Admitting: Internal Medicine

## 2021-06-17 NOTE — Telephone Encounter (Signed)
Copied from [place] [phone]. Topic: Medicare AWV ?>> Jun 17, 2021 11:11 AM Harris-Coley, Hannah Beat wrote: ?Reason for CRM: Left message for patient to schedule Annual Wellness Visit.  Please schedule with Nurse Health Advisor Denisa O'Brien-Blaney, LPN at [facility].  Please call [phone] ask for Juliann Pulse ?

## 2021-06-19 NOTE — Telephone Encounter (Signed)
Attempted to call pt no ans, no vm ?LM on pt daughter # to cb ?

## 2021-06-19 NOTE — Telephone Encounter (Signed)
Pt daughter returning call 

## 2021-06-28 NOTE — Telephone Encounter (Signed)
This has been taken care of. See result note. I called and spoke with daughter 06/27/21. ?

## 2021-07-01 ENCOUNTER — Other Ambulatory Visit: Payer: Self-pay | Admitting: Internal Medicine

## 2021-07-18 ENCOUNTER — Other Ambulatory Visit: Payer: Self-pay | Admitting: Internal Medicine

## 2021-07-19 ENCOUNTER — Other Ambulatory Visit: Payer: Self-pay | Admitting: Internal Medicine

## 2021-07-20 MED ORDER — ALPRAZOLAM 0.5 MG PO TABS: 0.5000 mg | ORAL_TABLET | Freq: Every day | ORAL | 0 refills | Status: DC | PRN

## 2021-07-20 NOTE — Telephone Encounter (Signed)
Refused.  Duplicate message.  Already refilled.

## 2021-07-25 ENCOUNTER — Telehealth: Payer: Self-pay | Admitting: Internal Medicine

## 2021-07-25 NOTE — Telephone Encounter (Signed)
Copied from [place] [phone]. Topic: Medicare AWV >> Jul 25, 2021 10:41 AM Harris-Coley, Hannah Beat wrote: Reason for CRM: Attempted to schedule AWV. Unable to LVM.  Will try at later time.

## 2021-08-21 ENCOUNTER — Other Ambulatory Visit: Payer: Self-pay

## 2021-08-22 ENCOUNTER — Other Ambulatory Visit: Payer: Self-pay

## 2021-08-22 ENCOUNTER — Other Ambulatory Visit: Payer: Self-pay | Admitting: Internal Medicine

## 2021-08-24 MED ORDER — ALPRAZOLAM 0.5 MG PO TABS
0.5000 mg | ORAL_TABLET | Freq: Every day | ORAL | 0 refills | Status: DC | PRN
  Filled 2021-08-24: qty 30, 30d supply, fill #0

## 2021-08-25 ENCOUNTER — Other Ambulatory Visit: Payer: Self-pay

## 2021-08-26 ENCOUNTER — Other Ambulatory Visit: Payer: Self-pay

## 2021-08-27 NOTE — Telephone Encounter (Signed)
Lm for pt daughter Art Buff to c/b to sched appt

## 2021-08-28 ENCOUNTER — Encounter: Payer: Self-pay | Admitting: Internal Medicine

## 2021-09-17 ENCOUNTER — Ambulatory Visit (INDEPENDENT_AMBULATORY_CARE_PROVIDER_SITE_OTHER): Payer: PPO | Admitting: Internal Medicine

## 2021-09-17 ENCOUNTER — Encounter: Payer: Self-pay | Admitting: Internal Medicine

## 2021-09-17 DIAGNOSIS — L989 Disorder of the skin and subcutaneous tissue, unspecified: Secondary | ICD-10-CM

## 2021-09-17 DIAGNOSIS — F0280 Dementia in other diseases classified elsewhere without behavioral disturbance: Secondary | ICD-10-CM | POA: Diagnosis not present

## 2021-09-17 DIAGNOSIS — M810 Age-related osteoporosis without current pathological fracture: Secondary | ICD-10-CM

## 2021-09-17 DIAGNOSIS — M353 Polymyalgia rheumatica: Secondary | ICD-10-CM

## 2021-09-17 DIAGNOSIS — F0150 Vascular dementia without behavioral disturbance: Secondary | ICD-10-CM

## 2021-09-17 DIAGNOSIS — I10 Essential (primary) hypertension: Secondary | ICD-10-CM

## 2021-09-17 DIAGNOSIS — R739 Hyperglycemia, unspecified: Secondary | ICD-10-CM

## 2021-09-17 DIAGNOSIS — G309 Alzheimer's disease, unspecified: Secondary | ICD-10-CM

## 2021-09-17 DIAGNOSIS — F419 Anxiety disorder, unspecified: Secondary | ICD-10-CM

## 2021-09-17 DIAGNOSIS — I779 Disorder of arteries and arterioles, unspecified: Secondary | ICD-10-CM | POA: Diagnosis not present

## 2021-09-17 DIAGNOSIS — I7121 Aneurysm of the ascending aorta, without rupture: Secondary | ICD-10-CM | POA: Diagnosis not present

## 2021-09-17 DIAGNOSIS — E7800 Pure hypercholesterolemia, unspecified: Secondary | ICD-10-CM | POA: Diagnosis not present

## 2021-09-17 NOTE — Progress Notes (Signed)
[name], female   DOB: 1935-11-05, 86 y.o.   MRN: [id number]   Subjective:    [name], female    DOB: 1935-08-10, 86 y.o.   MRN: [id number]   Patient here for work in appt.   Chief Complaint  Patient presents with   Follow-up    Concern about spot on nose   .   HPI She reports she is doing relatively well.  Stays active.  No chest pain or sob reported.  No cough or congestion.  No acid reflux.  No abdominal pain.  Bowels moving.  Concerned regarding persistent lesion - right lateral nose and right post/lateral leg.  Handling stress.     Past Medical History:  Diagnosis Date   Anxiety    Arthritis    Cataract    History of chicken pox    History of kidney stones October 2013   Hx: UTI (urinary tract infection)    Hyperlipidemia    Hypertension    Rheumatoid arthritis ([place])    Stroke ([place])    tia   Past Surgical History:  Procedure Laterality Date   ABDOMINAL HYSTERECTOMY     BREAST BIOPSY Right 1995   CATARACT EXTRACTION W/PHACO Left 04/13/2018   Procedure: CATARACT EXTRACTION PHACO AND INTRAOCULAR LENS PLACEMENT ([place]) Left Eye;  Surgeon: Birder Robson, MD;  Location: [facility] ORS;  Service: Ophthalmology;  Laterality: Left;  [place]  00:47.0 CDE 5.52 Fluid pack lot # [id number]   EYE SURGERY     IR ANGIO INTRA EXTRACRAN SEL COM CAROTID INNOMINATE BILAT MOD SED  07/18/2016   IR ANGIO VERTEBRAL SEL VERTEBRAL BILAT MOD SED  07/18/2016   PARTIAL HYSTERECTOMY  1972   TONSILLECTOMY AND ADENOIDECTOMY  1945   Family History  Problem Relation Age of Onset   Asthma Mother    Diabetes Mother    Cataracts Mother    Heart disease Father    Diabetes Father    Hypertension Father    Cancer Paternal Aunt        Liver   Diabetes Paternal Grandmother    Diabetes Sister    Retinal degeneration Sister    Glaucoma Sister    Social History   Socioeconomic History   Marital status: Married    Spouse name: Not on file   Number of children: Not on file   Years of  education: Not on file   Highest education level: Not on file  Occupational History   Not on file  Tobacco Use   Smoking status: Never   Smokeless tobacco: Never  Vaping Use   Vaping Use: Unknown  Substance and Sexual Activity   Alcohol use: No    Alcohol/week: 0.0 standard drinks of alcohol   Drug use: No   Sexual activity: Not on file  Other Topics Concern   Not on file  Social History Narrative   Not on file   Social Determinants of Health   Financial Resource Strain: High Risk (04/02/2020)   Overall Financial Resource Strain (CARDIA)    Difficulty of Paying Living Expenses: Very hard  Food Insecurity: No Food Insecurity (04/02/2020)   Hunger Vital Sign    Worried About Running Out of Food in the Last Year: Never true    Ran Out of Food in the Last Year: Never true  Transportation Needs: No Transportation Needs (04/02/2020)   PRAPARE - Hydrologist (Medical): No    Lack of Transportation (Non-Medical): No  Physical  Activity: Sufficiently Active (04/02/2020)   Exercise Vital Sign    Days of Exercise per Week: 7 days    Minutes of Exercise per Session: 30 min  Stress: No Stress Concern Present (04/02/2020)   [place]    Feeling of Stress : Not at all  Social Connections: Unknown (04/02/2020)   Social Connection and Isolation Panel [NHANES]    Frequency of Communication with Friends and Family: More than three times a week    Frequency of Social Gatherings with Friends and Family: More than three times a week    Attends Religious Services: Not on Advertising copywriter or Organizations: Not on file    Attends Archivist Meetings: Not on file    Marital Status: Married     Review of Systems  Constitutional:  Negative for appetite change and unexpected weight change.  HENT:  Negative for congestion and sinus pressure.   Respiratory:  Negative for cough, chest  tightness and shortness of breath.   Cardiovascular:  Negative for chest pain, palpitations and leg swelling.  Gastrointestinal:  Negative for abdominal pain, diarrhea, nausea and vomiting.  Genitourinary:  Negative for difficulty urinating and dysuria.  Musculoskeletal:  Negative for joint swelling and myalgias.  Skin:  Negative for color change and rash.  Neurological:  Negative for dizziness, light-headedness and headaches.  Psychiatric/Behavioral:  Negative for agitation and dysphoric mood.        Objective:     BP (!) 120/58 (BP Location: Left Arm, Patient Position: Sitting, Cuff Size: Normal)   Pulse 80   Temp 98.3 F (36.8 C) (Oral)   Resp (!) 21   Ht $R'5\' 5"'Tf$  (1.651 m)   Wt 137 lb 9.6 oz (62.4 kg)   SpO2 96%   BMI 22.90 kg/m  Wt Readings from Last 3 Encounters:  09/17/21 137 lb 9.6 oz (62.4 kg)  05/28/21 139 lb 6.4 oz (63.2 kg)  09/07/20 138 lb (62.6 kg)    Physical Exam Vitals reviewed.  Constitutional:      General: She is not in acute distress.    Appearance: Normal appearance.  HENT:     Head: Normocephalic and atraumatic.     Right Ear: External ear normal.     Left Ear: External ear normal.  Eyes:     General: No scleral icterus.       Right eye: No discharge.        Left eye: No discharge.     Conjunctiva/sclera: Conjunctivae normal.  Neck:     Thyroid: No thyromegaly.  Cardiovascular:     Rate and Rhythm: Normal rate and regular rhythm.  Pulmonary:     Effort: No respiratory distress.     Breath sounds: Normal breath sounds. No wheezing.  Abdominal:     General: Bowel sounds are normal.     Palpations: Abdomen is soft.     Tenderness: There is no abdominal tenderness.  Musculoskeletal:        General: No swelling or tenderness.     Cervical back: Neck supple. No tenderness.  Lymphadenopathy:     Cervical: No cervical adenopathy.  Skin:    Findings: No erythema or rash.     Comments: Lesion - right lateral nose.   Neurological:     Mental  Status: She is alert.  Psychiatric:        Mood and Affect: Mood normal.  Behavior: Behavior normal.      Outpatient Encounter Medications as of 09/17/2021  Medication Sig   ALPRAZolam (XANAX) 0.5 MG tablet Take 1 tablet (0.5 mg total) by mouth daily as needed for anxiety.   amLODipine (NORVASC) 5 MG tablet Take 1 tablet (5 mg total) by mouth daily.   ascorbic acid (VITAMIN C) 1000 MG tablet Take 1,000 mg by mouth daily.   atenolol (TENORMIN) 25 MG tablet Take 1 tablet (25 mg total) by mouth daily. Must keep appt in June for additional refills   cholecalciferol (VITAMIN D) 25 MCG tablet Take 5 tablets (5,000 Units total) by mouth daily.   clopidogrel (PLAVIX) 75 MG tablet Take 1 tablet (75 mg total) by mouth daily.   folic acid (FOLVITE) 1 MG tablet Take 2 mg by mouth daily.    magnesium oxide (MAG-OX) 400 MG tablet Take by mouth.   methotrexate (RHEUMATREX) 2.5 MG tablet Take 6 tablets per week. (Patient taking differently: Take 12.5 mg by mouth every Saturday.)   PARoxetine (PAXIL) 20 MG tablet Take 1 tablet (20 mg total) by mouth daily.   vitamin E 180 MG (400 UNITS) capsule Take by mouth.   No facility-administered encounter medications on file as of 09/17/2021.     Lab Results  Component Value Date   WBC 9.4 05/28/2021   HGB 13.4 05/28/2021   HCT 40.9 05/28/2021   PLT 306.0 05/28/2021   GLUCOSE 137 (H) 05/28/2021   CHOL 254 (H) 05/28/2021   TRIG 235.0 (H) 05/28/2021   HDL 39.70 05/28/2021   LDLDIRECT 181.0 05/28/2021   LDLCALC 218 (H) 08/12/2018   ALT 11 05/28/2021   AST 16 05/28/2021   NA 139 05/28/2021   K 4.0 05/28/2021   CL 105 05/28/2021   CREATININE 0.98 05/28/2021   BUN 19 05/28/2021   CO2 24 05/28/2021   TSH 1.69 02/17/2020   INR 1.07 07/18/2016   HGBA1C 6.1 05/28/2021       Assessment & Plan:   Problem List Items Addressed This Visit     Anxiety    Continues on paxil.  Has tapered xanax.  Not taking now.  Follow       Ascending aortic  aneurysm (HCC)    Saw Dr Genevive Bi 01/2020.  Stable.  Recommended f/u in one year.  Overdue. Needs f/u.       Relevant Orders   Ambulatory referral to Vascular Surgery   Carotid artery disease ([place])    Evaluated by [facility] 03/2019.  Stable.  Recommended f/u in 24 months.  Continue plavix.  Overdue f/u.       Relevant Orders   Ambulatory referral to Vascular Surgery   Essential hypertension    Continue amlodipine and atenolol.   Follow blood pressure.  Follow metabolic panel.       Hypercholesterolemia    Intolerance to zetia and crestor. Declines cholesterol medication.  Follow lipid panel.  Low cholesterol diet and exercise.        Hyperglycemia    Low carb diet and exercise.  Follow met b and a1c.       Mixed Alzheimer's and vascular dementia ([place])    Saw neurology.   Note reviewed.  Recommended to start seroquel.  Never started. Feels she is sleeping well.  Follow.       Osteoporosis    Had reaction to reclast.  Calcium, vitamin D and weight bearing exercise.       Polymyalgia rheumatica (HCC)    Inflammatory arthritis/PMR presentation.  Stable.  On MTX.       Skin lesion    Skin lesion - right lateral nose and right post/lateral leg.  Request referral to dermatology.       Relevant Orders   Ambulatory referral to Dermatology     [name], MD

## 2021-09-22 ENCOUNTER — Encounter: Payer: Self-pay | Admitting: Internal Medicine

## 2021-09-22 ENCOUNTER — Telehealth: Payer: Self-pay | Admitting: Internal Medicine

## 2021-09-22 DIAGNOSIS — L989 Disorder of the skin and subcutaneous tissue, unspecified: Secondary | ICD-10-CM | POA: Insufficient documentation

## 2021-09-22 NOTE — Assessment & Plan Note (Signed)
Low carb diet and exercise.  Follow met b and a1c.  

## 2021-09-22 NOTE — Assessment & Plan Note (Signed)
Saw neurology.   Note reviewed.  Recommended to start seroquel.  Never started. Feels she is sleeping well.  Follow.

## 2021-09-22 NOTE — Telephone Encounter (Signed)
Previously saw [facility] - for carotid.  Needs f/u for carotid and needs f/u aortic aneurysm.  If agreeable to schedule f/u [facility], then let me know and I will place order for referral.

## 2021-09-22 NOTE — Assessment & Plan Note (Signed)
Skin lesion - right lateral nose and right post/lateral leg.  Request referral to dermatology.

## 2021-09-22 NOTE — Assessment & Plan Note (Signed)
Continue amlodipine and atenolol.   Follow blood pressure.  Follow metabolic panel.  

## 2021-09-22 NOTE — Assessment & Plan Note (Signed)
Evaluated by [facility] 03/2019.  Stable.  Recommended f/u in 24 months.  Continue plavix.  Overdue f/u.  

## 2021-09-22 NOTE — Assessment & Plan Note (Signed)
Intolerance to zetia and crestor. Declines cholesterol medication.  Follow lipid panel.  Low cholesterol diet and exercise.   

## 2021-09-22 NOTE — Assessment & Plan Note (Signed)
Saw Dr Oaks 01/2020.  Stable.  Recommended f/u in one year.  Overdue. Needs f/u.  

## 2021-09-22 NOTE — Assessment & Plan Note (Signed)
Inflammatory arthritis/PMR presentation.  Stable.  On MTX.

## 2021-09-22 NOTE — Assessment & Plan Note (Signed)
Had reaction to reclast.  Calcium, vitamin D and weight bearing exercise.

## 2021-09-22 NOTE — Assessment & Plan Note (Signed)
Continues on paxil.  Has tapered xanax.  Not taking now.  Follow

## 2021-09-23 NOTE — Telephone Encounter (Signed)
S/w pt daughter - agreeable to referral for vascular - advised has not been seen since 2021 , will need new referral. Advised if has not heard to schedule in about 2 weeks , let me know.

## 2021-09-24 NOTE — Telephone Encounter (Signed)
Order placed for vascular surgery referral.   

## 2021-09-24 NOTE — Addendum Note (Signed)
Addended by: Alisa Graff on: 09/24/2021 02:20 AM   Modules accepted: Orders

## 2021-10-09 ENCOUNTER — Ambulatory Visit: Payer: PPO | Admitting: Internal Medicine

## 2021-10-14 ENCOUNTER — Other Ambulatory Visit: Payer: Self-pay

## 2021-10-14 ENCOUNTER — Other Ambulatory Visit: Payer: Self-pay | Admitting: Internal Medicine

## 2021-10-14 ENCOUNTER — Telehealth: Payer: Self-pay | Admitting: Internal Medicine

## 2021-10-14 MED ORDER — PAROXETINE HCL 20 MG PO TABS
20.0000 mg | ORAL_TABLET | Freq: Every day | ORAL | 1 refills | Status: DC
  Filled 2021-10-14: qty 90, 90d supply, fill #0
  Filled 2022-01-08: qty 90, 90d supply, fill #1

## 2021-10-14 NOTE — Telephone Encounter (Signed)
Copied from [place] [phone]. Topic: Medicare AWV >> Oct 14, 2021 10:05 AM Devoria Glassing wrote: Reason for CRM: Attempted to schedule AWV. Unable to LVM.  Will try at later time.

## 2021-11-08 ENCOUNTER — Other Ambulatory Visit: Payer: Self-pay

## 2021-11-08 ENCOUNTER — Other Ambulatory Visit: Payer: Self-pay | Admitting: Internal Medicine

## 2021-11-08 MED ORDER — CLOPIDOGREL BISULFATE 75 MG PO TABS
75.0000 mg | ORAL_TABLET | Freq: Every day | ORAL | 0 refills | Status: DC
  Filled 2021-11-08: qty 90, 90d supply, fill #0

## 2021-11-08 MED ORDER — AMLODIPINE BESYLATE 5 MG PO TABS
5.0000 mg | ORAL_TABLET | Freq: Every day | ORAL | 0 refills | Status: DC
  Filled 2021-11-08: qty 90, 90d supply, fill #0

## 2021-11-19 ENCOUNTER — Other Ambulatory Visit (INDEPENDENT_AMBULATORY_CARE_PROVIDER_SITE_OTHER): Payer: Self-pay | Admitting: Nurse Practitioner

## 2021-11-19 DIAGNOSIS — I7121 Aneurysm of the ascending aorta, without rupture: Secondary | ICD-10-CM

## 2021-11-20 ENCOUNTER — Other Ambulatory Visit (INDEPENDENT_AMBULATORY_CARE_PROVIDER_SITE_OTHER): Payer: PPO

## 2021-11-20 ENCOUNTER — Encounter (INDEPENDENT_AMBULATORY_CARE_PROVIDER_SITE_OTHER): Payer: PPO

## 2021-11-20 ENCOUNTER — Ambulatory Visit (INDEPENDENT_AMBULATORY_CARE_PROVIDER_SITE_OTHER): Payer: PPO | Admitting: Nurse Practitioner

## 2021-12-25 ENCOUNTER — Encounter (INDEPENDENT_AMBULATORY_CARE_PROVIDER_SITE_OTHER): Payer: PPO

## 2021-12-25 ENCOUNTER — Other Ambulatory Visit (INDEPENDENT_AMBULATORY_CARE_PROVIDER_SITE_OTHER): Payer: PPO

## 2021-12-25 ENCOUNTER — Ambulatory Visit (INDEPENDENT_AMBULATORY_CARE_PROVIDER_SITE_OTHER): Payer: PPO | Admitting: Nurse Practitioner

## 2021-12-30 ENCOUNTER — Encounter (INDEPENDENT_AMBULATORY_CARE_PROVIDER_SITE_OTHER): Payer: Self-pay

## 2022-01-08 ENCOUNTER — Other Ambulatory Visit: Payer: Self-pay | Admitting: Internal Medicine

## 2022-01-08 ENCOUNTER — Other Ambulatory Visit: Payer: Self-pay

## 2022-01-08 MED ORDER — ALPRAZOLAM 0.5 MG PO TABS
0.5000 mg | ORAL_TABLET | Freq: Every day | ORAL | 0 refills | Status: DC | PRN
  Filled 2022-01-08: qty 15, 15d supply, fill #0

## 2022-01-08 NOTE — Telephone Encounter (Signed)
Pt scheduled 03/25/22.

## 2022-01-08 NOTE — Telephone Encounter (Signed)
Rx sent in for xanax, but needs a f/u appt scheduled.

## 2022-01-14 ENCOUNTER — Telehealth: Payer: Self-pay | Admitting: Internal Medicine

## 2022-01-14 NOTE — Telephone Encounter (Signed)
Copied from [place] [phone]. Topic: Medicare AWV >> Jan 14, 2022 11:18 AM Devoria Glassing wrote: Reason for CRM: Left message for patient to schedule Annual Wellness Visit.  Please schedule with Nurse Health Advisor Denisa O'Brien-Blaney, LPN at [facility]. This appt can be telephone or office visit.  Please call [phone] ask for [facility]

## 2022-01-22 ENCOUNTER — Encounter: Payer: Self-pay | Admitting: Internal Medicine

## 2022-01-27 NOTE — Telephone Encounter (Signed)
Daughter called, note was read and appt made for 12/7 @ 3:30 scheduled. Daughter stated to the question as follows, Mother did have appointments, but canceled  Vascular and Vein, and Neurology. Daughter would like to discuss at next appointment.

## 2022-01-27 NOTE — Telephone Encounter (Signed)
Last visit, we had discussed (and referred her back for) f/u with vascular - for carotids and her vascular issues.  She has been seen at [facility].  Is she agreeable for f/u.  Also has seen neurology for headaches.  I am ok to schedule an appt and discuss and schedule what is needed, but she also needs the above f/u as well.  If any acute issues, needs to be evaluated more urgently.

## 2022-01-28 DIAGNOSIS — D0471 Carcinoma in situ of skin of right lower limb, including hip: Secondary | ICD-10-CM | POA: Diagnosis not present

## 2022-01-28 DIAGNOSIS — D485 Neoplasm of uncertain behavior of skin: Secondary | ICD-10-CM | POA: Diagnosis not present

## 2022-01-28 DIAGNOSIS — L821 Other seborrheic keratosis: Secondary | ICD-10-CM | POA: Diagnosis not present

## 2022-01-28 DIAGNOSIS — L298 Other pruritus: Secondary | ICD-10-CM | POA: Diagnosis not present

## 2022-01-28 NOTE — Telephone Encounter (Signed)
Reviewed her note.  Please call and confirm nothing acute going on - per previous note if any change or acute issues - needs to be evaluated more acutely

## 2022-01-28 NOTE — Telephone Encounter (Signed)
S/w pt daughter -  Stated this is something they have been dealing with for a while. Daughter states though she does see some decline in pt, doesn't think anything is severely increased or changed.  Daughter states pt changes her mind everyday as to how she is feeling. One day she is crying that she feels terrible, the next day she says she feels great and has been having great days.  Pt also has been refusing to go to appointments with Neuro, and refused to take medication prescribed by Neuro. Pt daughter has tried several times to get her to go to [facility], pt also refuses to go there.  Pt daughter wanted to discuss these issues with you at appointment.  Pt daughter advised will keep 12/7 appointment , but may call her if cancellation comes up to come in earlier. Pt daughter agreeable.

## 2022-02-06 ENCOUNTER — Other Ambulatory Visit: Payer: Self-pay

## 2022-02-06 ENCOUNTER — Ambulatory Visit (INDEPENDENT_AMBULATORY_CARE_PROVIDER_SITE_OTHER): Payer: PPO | Admitting: Internal Medicine

## 2022-02-06 ENCOUNTER — Other Ambulatory Visit: Payer: Self-pay | Admitting: Internal Medicine

## 2022-02-06 ENCOUNTER — Encounter: Payer: Self-pay | Admitting: Internal Medicine

## 2022-02-06 VITALS — BP 122/68 | HR 64 | Temp 98.1°F | Resp 15 | Ht 65.0 in | Wt 145.4 lb

## 2022-02-06 DIAGNOSIS — E7800 Pure hypercholesterolemia, unspecified: Secondary | ICD-10-CM

## 2022-02-06 DIAGNOSIS — I77810 Thoracic aortic ectasia: Secondary | ICD-10-CM | POA: Diagnosis not present

## 2022-02-06 DIAGNOSIS — G479 Sleep disorder, unspecified: Secondary | ICD-10-CM | POA: Diagnosis not present

## 2022-02-06 DIAGNOSIS — G309 Alzheimer's disease, unspecified: Secondary | ICD-10-CM

## 2022-02-06 DIAGNOSIS — F0280 Dementia in other diseases classified elsewhere without behavioral disturbance: Secondary | ICD-10-CM | POA: Diagnosis not present

## 2022-02-06 DIAGNOSIS — I10 Essential (primary) hypertension: Secondary | ICD-10-CM

## 2022-02-06 DIAGNOSIS — I779 Disorder of arteries and arterioles, unspecified: Secondary | ICD-10-CM

## 2022-02-06 DIAGNOSIS — I7121 Aneurysm of the ascending aorta, without rupture: Secondary | ICD-10-CM

## 2022-02-06 DIAGNOSIS — R519 Headache, unspecified: Secondary | ICD-10-CM | POA: Diagnosis not present

## 2022-02-06 DIAGNOSIS — F0150 Vascular dementia without behavioral disturbance: Secondary | ICD-10-CM | POA: Diagnosis not present

## 2022-02-06 DIAGNOSIS — R739 Hyperglycemia, unspecified: Secondary | ICD-10-CM | POA: Diagnosis not present

## 2022-02-06 MED ORDER — ATENOLOL 25 MG PO TABS
25.0000 mg | ORAL_TABLET | Freq: Every day | ORAL | 0 refills | Status: DC
  Filled 2022-02-06: qty 90, 90d supply, fill #0

## 2022-02-06 MED ORDER — QUETIAPINE FUMARATE 25 MG PO TABS
25.0000 mg | ORAL_TABLET | Freq: Every day | ORAL | 1 refills | Status: DC
  Filled 2022-02-06: qty 30, 30d supply, fill #0
  Filled 2022-03-05: qty 30, 30d supply, fill #1

## 2022-02-06 NOTE — Progress Notes (Addendum)
[name], female   DOB: 07/19/1935, 86 y.o.   MRN: [id number]   Subjective:    [name], female    DOB: December 02, 1935, 86 y.o.   MRN: [id number]   Patient here for a scheduled follow up .   HPI Here to follow up regarding cognitive issues, cholesterol and f/u regarding previous scans.  She is accompanied by her daughter.  History obtained from both of them.  Ms Beth Espinoza reports she is doing relatively well.  Daughter - reports concern - repeating herself, more forgetful and misplaces items.  No chest pain or sob reported.  Reports some headaches.  Having some difficulty sleeping.  Discussed sleep issues.  She is on her IPAD "all of the time" - including around bedtime.  Eating.  No nausea or vomiting.  No abdominal pain reported.    Past Medical History:  Diagnosis Date   Anxiety    Arthritis    Cataract    History of chicken pox    History of kidney stones October 2013   Hx: UTI (urinary tract infection)    Hyperlipidemia    Hypertension    Rheumatoid arthritis ([place])    Stroke ([place])    tia   Past Surgical History:  Procedure Laterality Date   ABDOMINAL HYSTERECTOMY     BREAST BIOPSY Right 1995   CATARACT EXTRACTION W/PHACO Left 04/13/2018   Procedure: CATARACT EXTRACTION PHACO AND INTRAOCULAR LENS PLACEMENT ([place]) Left Eye;  Surgeon: Birder Robson, MD;  Location: [facility] ORS;  Service: Ophthalmology;  Laterality: Left;  [place]  00:47.0 CDE 5.52 Fluid pack lot # [id number]   EYE SURGERY     IR ANGIO INTRA EXTRACRAN SEL COM CAROTID INNOMINATE BILAT MOD SED  07/18/2016   IR ANGIO VERTEBRAL SEL VERTEBRAL BILAT MOD SED  07/18/2016   PARTIAL HYSTERECTOMY  1972   TONSILLECTOMY AND ADENOIDECTOMY  1945   Family History  Problem Relation Age of Onset   Asthma Mother    Diabetes Mother    Cataracts Mother    Heart disease Father    Diabetes Father    Hypertension Father    Cancer Paternal Aunt        Liver   Diabetes Paternal Grandmother    Diabetes Sister    Retinal  degeneration Sister    Glaucoma Sister    Social History   Socioeconomic History   Marital status: Married    Spouse name: Not on file   Number of children: Not on file   Years of education: Not on file   Highest education level: Not on file  Occupational History   Not on file  Tobacco Use   Smoking status: Never   Smokeless tobacco: Never  Vaping Use   Vaping Use: Unknown  Substance and Sexual Activity   Alcohol use: No    Alcohol/week: 0.0 standard drinks of alcohol   Drug use: No   Sexual activity: Not on file  Other Topics Concern   Not on file  Social History Narrative   Not on file   Social Determinants of Health   Financial Resource Strain: High Risk (04/02/2020)   Overall Financial Resource Strain (CARDIA)    Difficulty of Paying Living Expenses: Very hard  Food Insecurity: No Food Insecurity (04/02/2020)   Hunger Vital Sign    Worried About Running Out of Food in the Last Year: Never true    Ran Out of Food in the Last Year: Never true  Transportation Needs: No Transportation  Needs (04/02/2020)   PRAPARE - Hydrologist (Medical): No    Lack of Transportation (Non-Medical): No  Physical Activity: Sufficiently Active (04/02/2020)   Exercise Vital Sign    Days of Exercise per Week: 7 days    Minutes of Exercise per Session: 30 min  Stress: No Stress Concern Present (04/02/2020)   [place]    Feeling of Stress : Not at all  Social Connections: Unknown (04/02/2020)   Social Connection and Isolation Panel [NHANES]    Frequency of Communication with Friends and Family: More than three times a week    Frequency of Social Gatherings with Friends and Family: More than three times a week    Attends Religious Services: Not on Advertising copywriter or Organizations: Not on file    Attends Archivist Meetings: Not on file    Marital Status: Married      Review of Systems  Constitutional:  Negative for appetite change and unexpected weight change.  HENT:  Negative for congestion and sinus pressure.   Respiratory:  Negative for cough, chest tightness and shortness of breath.   Cardiovascular:  Negative for chest pain, palpitations and leg swelling.  Gastrointestinal:  Negative for abdominal pain, diarrhea, nausea and vomiting.  Genitourinary:  Negative for difficulty urinating and dysuria.  Musculoskeletal:  Negative for joint swelling and myalgias.  Skin:  Negative for color change and rash.  Neurological:  Positive for headaches. Negative for dizziness.  Psychiatric/Behavioral:  Negative for agitation and dysphoric mood.        Objective:     BP 122/68 (BP Location: Left Arm, Patient Position: Sitting, Cuff Size: Small)   Pulse 64   Temp 98.1 F (36.7 C) (Temporal)   Resp 15   Ht _0  (1.651 m)   Wt 145 lb 6.4 oz (66 kg)   SpO2 96%   BMI 24.20 kg/m  Wt Readings from Last 3 Encounters:  02/06/22 145 lb 6.4 oz (66 kg)  09/17/21 137 lb 9.6 oz (62.4 kg)  05/28/21 139 lb 6.4 oz (63.2 kg)    Physical Exam Vitals reviewed.  Constitutional:      General: She is not in acute distress.    Appearance: Normal appearance.  HENT:     Head: Normocephalic and atraumatic.     Right Ear: External ear normal.     Left Ear: External ear normal.  Eyes:     General: No scleral icterus.       Right eye: No discharge.        Left eye: No discharge.     Conjunctiva/sclera: Conjunctivae normal.  Neck:     Thyroid: No thyromegaly.  Cardiovascular:     Rate and Rhythm: Normal rate and regular rhythm.  Pulmonary:     Effort: No respiratory distress.     Breath sounds: Normal breath sounds. No wheezing.  Abdominal:     General: Bowel sounds are normal.     Palpations: Abdomen is soft.     Tenderness: There is no abdominal tenderness.  Musculoskeletal:        General: No swelling or tenderness.     Cervical back: Neck supple.  No tenderness.  Lymphadenopathy:     Cervical: No cervical adenopathy.  Skin:    Findings: No erythema or rash.  Neurological:     Mental Status: She is alert.  Psychiatric:  Mood and Affect: Mood normal.        Behavior: Behavior normal.      Outpatient Encounter Medications as of 02/06/2022  Medication Sig   ALPRAZolam (XANAX) 0.5 MG tablet Take 1 tablet (0.5 mg total) by mouth daily as needed for anxiety.   ascorbic acid (VITAMIN C) 1000 MG tablet Take 1,000 mg by mouth daily.   cholecalciferol (VITAMIN D) 25 MCG tablet Take 5 tablets (5,000 Units total) by mouth daily.   folic acid (FOLVITE) 1 MG tablet Take 2 mg by mouth daily.    magnesium oxide (MAG-OX) 400 MG tablet Take by mouth.   PARoxetine (PAXIL) 20 MG tablet Take 1 tablet (20 mg total) by mouth daily.   QUEtiapine (SEROQUEL) 25 MG tablet Take 1 tablet (25 mg total) by mouth at bedtime.   vitamin E 180 MG (400 UNITS) capsule Take by mouth.   [DISCONTINUED] amLODipine (NORVASC) 5 MG tablet Take 1 tablet (5 mg total) by mouth daily.   [DISCONTINUED] atenolol (TENORMIN) 25 MG tablet Take 1 tablet (25 mg total) by mouth daily. Must keep appt in June for additional refills   [DISCONTINUED] clopidogrel (PLAVIX) 75 MG tablet Take 1 tablet (75 mg total) by mouth daily.   [DISCONTINUED] methotrexate (RHEUMATREX) 2.5 MG tablet Take 6 tablets per week. (Patient taking differently: Take 12.5 mg by mouth every Saturday.)   No facility-administered encounter medications on file as of 02/06/2022.     Lab Results  Component Value Date   WBC 9.4 05/28/2021   HGB 13.4 05/28/2021   HCT 40.9 05/28/2021   PLT 306.0 05/28/2021   GLUCOSE 137 (H) 05/28/2021   CHOL 254 (H) 05/28/2021   TRIG 235.0 (H) 05/28/2021   HDL 39.70 05/28/2021   LDLDIRECT 181.0 05/28/2021   LDLCALC 218 (H) 08/12/2018   ALT 11 05/28/2021   AST 16 05/28/2021   NA 139 05/28/2021   K 4.0 05/28/2021   CL 105 05/28/2021   CREATININE 0.98 05/28/2021   BUN 19  05/28/2021   CO2 24 05/28/2021   TSH 1.69 02/17/2020   INR 1.07 07/18/2016   HGBA1C 6.1 05/28/2021       Assessment & Plan:   Problem List Items Addressed This Visit     Ascending aortic aneurysm ([place])    Saw Dr Genevive Bi 01/2020.  Stable.  Recommended f/u in one year.  Overdue. Needs f/u.       Relevant Orders   Ambulatory referral to Vascular Surgery   Carotid artery disease ([place]) - Primary    Evaluated by [facility] 03/2019.  Stable.  Recommended f/u in 24 months.  Continue plavix.  Overdue f/u.       Relevant Orders   Ambulatory referral to Vascular Surgery   Difficulty sleeping    Discussed.  Saw neurology.  Recommended starting seroquel.  Discussed need to avoid screens prior to bed.  Discussed bedtime routine.  Follow.       Ectatic thoracic aorta ([facility])    Saw Dr Genevive Bi 01/2020.  Stable.  Recommended f/u in one year.  Overdue.  Discussed.         Essential hypertension    Continue amlodipine and atenolol.   Follow blood pressure.  Follow metabolic panel.       Headache    Previously started on magnesium.  Persistent issue with intermittent headache.  Discussed f/u with neurology.  Agreeable.       Hypercholesterolemia    Intolerance to zetia and crestor. Declines cholesterol medication.  Follow  lipid panel.  Low cholesterol diet and exercise.        Hyperglycemia    Low carb diet and exercise.  Follow met b and a1c.       Mixed Alzheimer's and vascular dementia ([place])    Saw neurology.   Note reviewed.  Recommended to start seroquel.  Never started. Discussed today.  Sleep an issue as outlined.  Start seroquel 15m q hs. Follow.       Relevant Medications   QUEtiapine (SEROQUEL) 25 MG tablet     CEinar Pheasant MD

## 2022-02-07 ENCOUNTER — Encounter: Payer: Self-pay | Admitting: Internal Medicine

## 2022-02-07 ENCOUNTER — Other Ambulatory Visit: Payer: Self-pay

## 2022-02-07 DIAGNOSIS — R4189 Other symptoms and signs involving cognitive functions and awareness: Secondary | ICD-10-CM

## 2022-02-10 ENCOUNTER — Other Ambulatory Visit: Payer: Self-pay

## 2022-02-10 MED ORDER — CLOPIDOGREL BISULFATE 75 MG PO TABS
75.0000 mg | ORAL_TABLET | Freq: Every day | ORAL | 0 refills | Status: DC
  Filled 2022-02-10: qty 90, 90d supply, fill #0

## 2022-02-10 MED ORDER — AMLODIPINE BESYLATE 5 MG PO TABS
5.0000 mg | ORAL_TABLET | Freq: Every day | ORAL | 0 refills | Status: DC
  Filled 2022-02-10: qty 90, 90d supply, fill #0

## 2022-02-11 NOTE — Telephone Encounter (Signed)
Order placed for neurology referral.   

## 2022-02-24 ENCOUNTER — Encounter: Payer: Self-pay | Admitting: Internal Medicine

## 2022-02-24 NOTE — Assessment & Plan Note (Signed)
Saw Dr Genevive Bi 01/2020.  Stable.  Recommended f/u in one year.  Overdue. Needs f/u.

## 2022-02-24 NOTE — Assessment & Plan Note (Signed)
Intolerance to zetia and crestor. Declines cholesterol medication.  Follow lipid panel.  Low cholesterol diet and exercise.

## 2022-02-24 NOTE — Assessment & Plan Note (Signed)
Saw Dr Genevive Bi 01/2020.  Stable.  Recommended f/u in one year.  Overdue.  Discussed.

## 2022-02-24 NOTE — Assessment & Plan Note (Signed)
Saw neurology.   Note reviewed.  Recommended to start seroquel.  Never started. Discussed today.  Sleep an issue as outlined.  Start seroquel '25mg'$  q hs. Follow.

## 2022-02-24 NOTE — Assessment & Plan Note (Signed)
Discussed.  Saw neurology.  Recommended starting seroquel.  Discussed need to avoid screens prior to bed.  Discussed bedtime routine.  Follow.

## 2022-02-24 NOTE — Addendum Note (Signed)
Addended by: Alisa Graff on: 02/24/2022 09:57 PM   Modules accepted: Orders

## 2022-02-24 NOTE — Assessment & Plan Note (Signed)
Low carb diet and exercise.  Follow met b and a1c.  

## 2022-02-24 NOTE — Assessment & Plan Note (Signed)
Previously started on magnesium.  Persistent issue with intermittent headache.  Discussed f/u with neurology.  Agreeable.

## 2022-02-24 NOTE — Assessment & Plan Note (Signed)
Evaluated by [facility] 03/2019.  Stable.  Recommended f/u in 24 months.  Continue plavix.  Overdue f/u.

## 2022-02-24 NOTE — Assessment & Plan Note (Signed)
Continue amlodipine and atenolol.   Follow blood pressure.  Follow metabolic panel.

## 2022-03-25 ENCOUNTER — Ambulatory Visit: Payer: PPO | Admitting: Internal Medicine

## 2022-03-25 NOTE — Progress Notes (Deleted)
Subjective:    [name], female    DOB: 14-Apr-1935, 87 y.o.   MRN: [id number]  Patient here for No chief complaint on file.   HPI Here for f/u regarding hypertension, hypercholesterolemia and vascular issues.    Past Medical History:  Diagnosis Date   Anxiety    Arthritis    Cataract    History of chicken pox    History of kidney stones October 2013   Hx: UTI (urinary tract infection)    Hyperlipidemia    Hypertension    Rheumatoid arthritis ([place])    Stroke ([place])    tia   Past Surgical History:  Procedure Laterality Date   ABDOMINAL HYSTERECTOMY     BREAST BIOPSY Right 1995   CATARACT EXTRACTION W/PHACO Left 04/13/2018   Procedure: CATARACT EXTRACTION PHACO AND INTRAOCULAR LENS PLACEMENT ([place]) Left Eye;  Surgeon: Birder Robson, MD;  Location: [facility] ORS;  Service: Ophthalmology;  Laterality: Left;  [place]  00:47.0 CDE 5.52 Fluid pack lot # [id number]   EYE SURGERY     IR ANGIO INTRA EXTRACRAN SEL COM CAROTID INNOMINATE BILAT MOD SED  07/18/2016   IR ANGIO VERTEBRAL SEL VERTEBRAL BILAT MOD SED  07/18/2016   PARTIAL HYSTERECTOMY  1972   TONSILLECTOMY AND ADENOIDECTOMY  1945   Family History  Problem Relation Age of Onset   Asthma Mother    Diabetes Mother    Cataracts Mother    Heart disease Father    Diabetes Father    Hypertension Father    Cancer Paternal Aunt        Liver   Diabetes Paternal Grandmother    Diabetes Sister    Retinal degeneration Sister    Glaucoma Sister    Social History   Socioeconomic History   Marital status: Married    Spouse name: Not on file   Number of children: Not on file   Years of education: Not on file   Highest education level: Not on file  Occupational History   Not on file  Tobacco Use   Smoking status: Never   Smokeless tobacco: Never  Vaping Use   Vaping Use: Unknown  Substance and Sexual Activity   Alcohol use: No    Alcohol/week: 0.0 standard drinks of alcohol   Drug use: No   Sexual activity: Not on  file  Other Topics Concern   Not on file  Social History Narrative   Not on file   Social Determinants of Health   Financial Resource Strain: High Risk (04/02/2020)   Overall Financial Resource Strain (CARDIA)    Difficulty of Paying Living Expenses: Very hard  Food Insecurity: No Food Insecurity (04/02/2020)   Hunger Vital Sign    Worried About Running Out of Food in the Last Year: Never true    Ran Out of Food in the Last Year: Never true  Transportation Needs: No Transportation Needs (04/02/2020)   PRAPARE - Hydrologist (Medical): No    Lack of Transportation (Non-Medical): No  Physical Activity: Sufficiently Active (04/02/2020)   Exercise Vital Sign    Days of Exercise per Week: 7 days    Minutes of Exercise per Session: 30 min  Stress: No Stress Concern Present (04/02/2020)   [place]    Feeling of Stress : Not at all  Social Connections: Unknown (04/02/2020)   Social Connection and Isolation Panel [NHANES]    Frequency of Communication with Friends  and Family: More than three times a week    Frequency of Social Gatherings with Friends and Family: More than three times a week    Attends Religious Services: Not on file    Active Member of Clubs or Organizations: Not on file    Attends Archivist Meetings: Not on file    Marital Status: Married     Review of Systems     Objective:     There were no vitals taken for this visit. Wt Readings from Last 3 Encounters:  02/06/22 145 lb 6.4 oz (66 kg)  09/17/21 137 lb 9.6 oz (62.4 kg)  05/28/21 139 lb 6.4 oz (63.2 kg)    Physical Exam   Outpatient Encounter Medications as of 03/25/2022  Medication Sig   ALPRAZolam (XANAX) 0.5 MG tablet Take 1 tablet (0.5 mg total) by mouth daily as needed for anxiety.   amLODipine (NORVASC) 5 MG tablet Take 1 tablet (5 mg total) by mouth daily.   ascorbic acid (VITAMIN C) 1000 MG  tablet Take 1,000 mg by mouth daily.   atenolol (TENORMIN) 25 MG tablet Take 1 tablet (25 mg total) by mouth daily.   cholecalciferol (VITAMIN D) 25 MCG tablet Take 5 tablets (5,000 Units total) by mouth daily.   clopidogrel (PLAVIX) 75 MG tablet Take 1 tablet (75 mg total) by mouth daily.   folic acid (FOLVITE) 1 MG tablet Take 2 mg by mouth daily.    magnesium oxide (MAG-OX) 400 MG tablet Take by mouth.   PARoxetine (PAXIL) 20 MG tablet Take 1 tablet (20 mg total) by mouth daily.   QUEtiapine (SEROQUEL) 25 MG tablet Take 1 tablet (25 mg total) by mouth at bedtime.   vitamin E 180 MG (400 UNITS) capsule Take by mouth.   No facility-administered encounter medications on file as of 03/25/2022.     Lab Results  Component Value Date   WBC 9.4 05/28/2021   HGB 13.4 05/28/2021   HCT 40.9 05/28/2021   PLT 306.0 05/28/2021   GLUCOSE 137 (H) 05/28/2021   CHOL 254 (H) 05/28/2021   TRIG 235.0 (H) 05/28/2021   HDL 39.70 05/28/2021   LDLDIRECT 181.0 05/28/2021   LDLCALC 218 (H) 08/12/2018   ALT 11 05/28/2021   AST 16 05/28/2021   NA 139 05/28/2021   K 4.0 05/28/2021   CL 105 05/28/2021   CREATININE 0.98 05/28/2021   BUN 19 05/28/2021   CO2 24 05/28/2021   TSH 1.69 02/17/2020   INR 1.07 07/18/2016   HGBA1C 6.1 05/28/2021    No results found.     Assessment & Plan:  There are no diagnoses linked to this encounter.   [name], MD

## 2022-04-03 ENCOUNTER — Other Ambulatory Visit: Payer: Self-pay | Admitting: Internal Medicine

## 2022-04-03 ENCOUNTER — Other Ambulatory Visit: Payer: Self-pay

## 2022-04-03 MED ORDER — PAROXETINE HCL 20 MG PO TABS
20.0000 mg | ORAL_TABLET | Freq: Every day | ORAL | 1 refills | Status: DC
  Filled 2022-04-03: qty 90, 90d supply, fill #0
  Filled 2022-07-02: qty 90, 90d supply, fill #1

## 2022-04-03 MED ORDER — QUETIAPINE FUMARATE 25 MG PO TABS
25.0000 mg | ORAL_TABLET | Freq: Every day | ORAL | 1 refills | Status: DC
  Filled 2022-04-03: qty 30, 30d supply, fill #0
  Filled 2022-05-05: qty 30, 30d supply, fill #1

## 2022-04-25 ENCOUNTER — Telehealth: Payer: Self-pay | Admitting: Internal Medicine

## 2022-04-25 NOTE — Telephone Encounter (Signed)
Contacted Beth Espinoza to schedule their annual wellness visit. Appointment made for 05/05/2022.  Thank you,  [place] Direct dial  [phone]

## 2022-04-28 ENCOUNTER — Other Ambulatory Visit (INDEPENDENT_AMBULATORY_CARE_PROVIDER_SITE_OTHER): Payer: Self-pay | Admitting: Nurse Practitioner

## 2022-04-28 DIAGNOSIS — I6523 Occlusion and stenosis of bilateral carotid arteries: Secondary | ICD-10-CM

## 2022-04-30 ENCOUNTER — Ambulatory Visit (INDEPENDENT_AMBULATORY_CARE_PROVIDER_SITE_OTHER): Payer: PPO

## 2022-04-30 ENCOUNTER — Encounter (INDEPENDENT_AMBULATORY_CARE_PROVIDER_SITE_OTHER): Payer: Self-pay | Admitting: Nurse Practitioner

## 2022-04-30 ENCOUNTER — Ambulatory Visit (INDEPENDENT_AMBULATORY_CARE_PROVIDER_SITE_OTHER): Payer: PPO | Admitting: Nurse Practitioner

## 2022-04-30 VITALS — BP 120/64 | HR 64 | Resp 16 | Ht 65.0 in | Wt 146.2 lb

## 2022-04-30 DIAGNOSIS — I6523 Occlusion and stenosis of bilateral carotid arteries: Secondary | ICD-10-CM

## 2022-04-30 DIAGNOSIS — I779 Disorder of arteries and arterioles, unspecified: Secondary | ICD-10-CM

## 2022-04-30 DIAGNOSIS — I10 Essential (primary) hypertension: Secondary | ICD-10-CM

## 2022-04-30 DIAGNOSIS — I7121 Aneurysm of the ascending aorta, without rupture: Secondary | ICD-10-CM

## 2022-05-05 ENCOUNTER — Other Ambulatory Visit: Payer: Self-pay

## 2022-05-05 ENCOUNTER — Other Ambulatory Visit: Payer: Self-pay | Admitting: Internal Medicine

## 2022-05-05 ENCOUNTER — Ambulatory Visit (INDEPENDENT_AMBULATORY_CARE_PROVIDER_SITE_OTHER): Payer: PPO

## 2022-05-05 VITALS — Ht 65.0 in | Wt 146.0 lb

## 2022-05-05 DIAGNOSIS — Z0000 Encounter for general adult medical examination without abnormal findings: Secondary | ICD-10-CM | POA: Diagnosis not present

## 2022-05-05 MED ORDER — ATENOLOL 25 MG PO TABS
25.0000 mg | ORAL_TABLET | Freq: Every day | ORAL | 0 refills | Status: DC
  Filled 2022-05-05: qty 90, 90d supply, fill #0

## 2022-05-05 MED ORDER — CLOPIDOGREL BISULFATE 75 MG PO TABS
75.0000 mg | ORAL_TABLET | Freq: Every day | ORAL | 0 refills | Status: DC
  Filled 2022-05-05: qty 90, 90d supply, fill #0

## 2022-05-05 MED ORDER — AMLODIPINE BESYLATE 5 MG PO TABS
5.0000 mg | ORAL_TABLET | Freq: Every day | ORAL | 0 refills | Status: DC
  Filled 2022-05-05: qty 90, 90d supply, fill #0

## 2022-05-05 NOTE — Patient Instructions (Addendum)
Beth Espinoza , Thank you for taking time to come for your Medicare Wellness Visit. I appreciate your ongoing commitment to your health goals. Please review the following plan we discussed and let me know if I can assist you in the future.   These are the goals we discussed:  Goals Addressed             This Visit's Progress    Maintain healthy lifestyle       Walk for exercise Healthy diet          This is a list of the screening recommended for you and due dates:  Health Maintenance  Topic Date Due   Zoster (Shingles) Vaccine (1 of 2) 05/08/2022*   Pneumonia Vaccine (1 of 1 - PCV) 05/29/2022*   Flu Shot  06/01/2022*   Mammogram  02/05/2025*   Medicare Annual Wellness Visit  05/05/2023   DTaP/Tdap/Td vaccine (3 - Td or Tdap) 04/14/2028   DEXA scan (bone density measurement)  Completed   HPV Vaccine  Aged Out   COVID-19 Vaccine  Discontinued  *Topic was postponed. The date shown is not the original due date.    Conditions/risks identified: none new  Next appointment: Follow up in one year for your annual wellness visit    Preventive Care 65 Years and Older, Female Preventive care refers to lifestyle choices and visits with your health care provider that can promote health and wellness. What does preventive care include? A yearly physical exam. This is also called an annual well check. Dental exams once or twice a year. Routine eye exams. Ask your health care provider how often you should have your eyes checked. Personal lifestyle choices, including: Daily care of your teeth and gums. Regular physical activity. Eating a healthy diet. Avoiding tobacco and drug use. Limiting alcohol use. Practicing safe sex. Taking low-dose aspirin every day. Taking vitamin and mineral supplements as recommended by your health care provider. What happens during an annual well check? The services and screenings done by your health care provider during your annual well check will depend  on your age, overall health, lifestyle risk factors, and family history of disease. Counseling  Your health care provider may ask you questions about your: Alcohol use. Tobacco use. Drug use. Emotional well-being. Home and relationship well-being. Sexual activity. Eating habits. History of falls. Memory and ability to understand (cognition). Work and work Statistician. Reproductive health. Screening  You may have the following tests or measurements: Height, weight, and BMI. Blood pressure. Lipid and cholesterol levels. These may be checked every 5 years, or more frequently if you are over 31 years old. Skin check. Lung cancer screening. You may have this screening every year starting at age 79 if you have a 30-pack-year history of smoking and currently smoke or have quit within the past 15 years. Fecal occult blood test (FOBT) of the stool. You may have this test every year starting at age 21. Flexible sigmoidoscopy or colonoscopy. You may have a sigmoidoscopy every 5 years or a colonoscopy every 10 years starting at age 62. Hepatitis C blood test. Hepatitis B blood test. Sexually transmitted disease (STD) testing. Diabetes screening. This is done by checking your blood sugar (glucose) after you have not eaten for a while (fasting). You may have this done every 1-3 years. Bone density scan. This is done to screen for osteoporosis. You may have this done starting at age 44. Mammogram. This may be done every 1-2 years. Talk to your health care  provider about how often you should have regular mammograms. Talk with your health care provider about your test results, treatment options, and if necessary, the need for more tests. Vaccines  Your health care provider may recommend certain vaccines, such as: Influenza vaccine. This is recommended every year. Tetanus, diphtheria, and acellular pertussis (Tdap, Td) vaccine. You may need a Td booster every 10 years. Zoster vaccine. You may need  this after age 71. Pneumococcal 13-valent conjugate (PCV13) vaccine. One dose is recommended after age 81. Pneumococcal polysaccharide (PPSV23) vaccine. One dose is recommended after age 78. Talk to your health care provider about which screenings and vaccines you need and how often you need them. This information is not intended to replace advice given to you by your health care provider. Make sure you discuss any questions you have with your health care provider. Document Released: 03/16/2015 Document Revised: 11/07/2015 Document Reviewed: 12/19/2014 Elsevier Interactive Patient Education  2017 [place] Prevention in the Home Falls can cause injuries. They can happen to people of all ages. There are many things you can do to make your home safe and to help prevent falls. What can I do on the outside of my home? Regularly fix the edges of walkways and driveways and fix any cracks. Remove anything that might make you trip as you walk through a door, such as a raised step or threshold. Trim any bushes or trees on the path to your home. Use bright outdoor lighting. Clear any walking paths of anything that might make someone trip, such as rocks or tools. Regularly check to see if handrails are loose or broken. Make sure that both sides of any steps have handrails. Any raised decks and porches should have guardrails on the edges. Have any leaves, snow, or ice cleared regularly. Use sand or salt on walking paths during winter. Clean up any spills in your garage right away. This includes oil or grease spills. What can I do in the bathroom? Use night lights. Install grab bars by the toilet and in the tub and shower. Do not use towel bars as grab bars. Use non-skid mats or decals in the tub or shower. If you need to sit down in the shower, use a plastic, non-slip stool. Keep the floor dry. Clean up any water that spills on the floor as soon as it happens. Remove soap buildup in the tub  or shower regularly. Attach bath mats securely with double-sided non-slip rug tape. Do not have throw rugs and other things on the floor that can make you trip. What can I do in the bedroom? Use night lights. Make sure that you have a light by your bed that is easy to reach. Do not use any sheets or blankets that are too big for your bed. They should not hang down onto the floor. Have a firm chair that has side arms. You can use this for support while you get dressed. Do not have throw rugs and other things on the floor that can make you trip. What can I do in the kitchen? Clean up any spills right away. Avoid walking on wet floors. Keep items that you use a lot in easy-to-reach places. If you need to reach something above you, use a strong step stool that has a grab bar. Keep electrical cords out of the way. Do not use floor polish or wax that makes floors slippery. If you must use wax, use non-skid floor wax. Do not have throw rugs  and other things on the floor that can make you trip. What can I do with my stairs? Do not leave any items on the stairs. Make sure that there are handrails on both sides of the stairs and use them. Fix handrails that are broken or loose. Make sure that handrails are as long as the stairways. Check any carpeting to make sure that it is firmly attached to the stairs. Fix any carpet that is loose or worn. Avoid having throw rugs at the top or bottom of the stairs. If you do have throw rugs, attach them to the floor with carpet tape. Make sure that you have a light switch at the top of the stairs and the bottom of the stairs. If you do not have them, ask someone to add them for you. What else can I do to help prevent falls? Wear shoes that: Do not have high heels. Have rubber bottoms. Are comfortable and fit you well. Are closed at the toe. Do not wear sandals. If you use a stepladder: Make sure that it is fully opened. Do not climb a closed stepladder. Make  sure that both sides of the stepladder are locked into place. Ask someone to hold it for you, if possible. Clearly mark and make sure that you can see: Any grab bars or handrails. First and last steps. Where the edge of each step is. Use tools that help you move around (mobility aids) if they are needed. These include: Canes. Walkers. Scooters. Crutches. Turn on the lights when you go into a dark area. Replace any light bulbs as soon as they burn out. Set up your furniture so you have a clear path. Avoid moving your furniture around. If any of your floors are uneven, fix them. If there are any pets around you, be aware of where they are. Review your medicines with your doctor. Some medicines can make you feel dizzy. This can increase your chance of falling. Ask your doctor what other things that you can do to help prevent falls. This information is not intended to replace advice given to you by your health care provider. Make sure you discuss any questions you have with your health care provider. Document Released: 12/14/2008 Document Revised: 07/26/2015 Document Reviewed: 03/24/2014 Elsevier Interactive Patient Education  2017 [employer].

## 2022-05-05 NOTE — Progress Notes (Signed)
Subjective:   Beth Espinoza is a 87 y.o. female who presents for Medicare Annual (Subsequent) preventive examination.  Review of Systems    No ROS.  Medicare Wellness Virtual Visit.  Visual/audio telehealth visit, UTA vital signs.   See social history for additional risk factors.   Cardiac Risk Factors include: advanced age (>72mn, >>71women);hypertension     Objective:    Today's Vitals   05/05/22 1341  Weight: 146 lb (66.2 kg)  Height: '5\' 5"'$  (1.651 m)   Body mass index is 24.3 kg/m.     05/05/2022    1:46 PM 09/07/2020    5:25 PM 04/02/2020   11:27 AM 01/16/2019   11:03 AM 01/15/2019   10:11 PM 01/15/2019    8:52 AM 01/04/2019   12:03 PM  Advanced Directives  Does Patient Have a Medical Advance Directive? Yes No Yes Yes Yes Yes Yes  Type of AMaterials engineerof [place] will Healthcare Power of ASalixof [place]of [place] Does patient want to make changes to medical advance directive? No - Patient declined  No - Patient declined No - Guardian declined   No - Patient declined  Copy of [place] Chart?   No - copy requested No - copy requested   No - copy requested    Current Medications (verified) Outpatient Encounter Medications as of 05/05/2022  Medication Sig   ALPRAZolam (XANAX) 0.5 MG tablet Take 1 tablet (0.5 mg total) by mouth daily as needed for anxiety.   amLODipine (NORVASC) 5 MG tablet Take 1 tablet (5 mg total) by mouth daily.   ascorbic acid (VITAMIN C) 1000 MG tablet Take 1,000 mg by mouth daily.   atenolol (TENORMIN) 25 MG tablet Take 1 tablet (25 mg total) by mouth daily.   cholecalciferol (VITAMIN D) 25 MCG tablet Take 5 tablets (5,000 Units total) by mouth daily.   clopidogrel (PLAVIX) 75 MG tablet Take 1 tablet (75 mg total) by mouth daily.   folic acid (FOLVITE) 1 MG tablet Take 2 mg by mouth daily.    magnesium  oxide (MAG-OX) 400 MG tablet Take by mouth.   PARoxetine (PAXIL) 20 MG tablet Take 1 tablet (20 mg total) by mouth daily.   QUEtiapine (SEROQUEL) 25 MG tablet Take 1 tablet (25 mg total) by mouth at bedtime.   vitamin E 180 MG (400 UNITS) capsule Take by mouth.   No facility-administered encounter medications on file as of 05/05/2022.    Allergies (verified) Pneumovax [pneumococcal polysaccharide vaccine], Pneumococcal vaccine, and Reclast [zoledronic acid]   History: Past Medical History:  Diagnosis Date   Anxiety    Arthritis    Cataract    History of chicken pox    History of kidney stones October 2013   Hx: UTI (urinary tract infection)    Hyperlipidemia    Hypertension    Rheumatoid arthritis ([place]    Stroke ([place]    tia   Past Surgical History:  Procedure Laterality Date   ABDOMINAL HYSTERECTOMY     BREAST BIOPSY Right 1995   CATARACT EXTRACTION W/PHACO Left 04/13/2018   Procedure: CATARACT EXTRACTION PHACO AND INTRAOCULAR LENS PLACEMENT ([place];  Surgeon: PBirder Robson MD;  Location: [facility] ORS;  Service: Ophthalmology;  Laterality: Left;  [place] 00:47.0 CDE 5.52 Fluid pack lot # [id number]   EYE SURGERY     IR ANGIO INTRA EXTRACRAN SEL COM  CAROTID INNOMINATE BILAT MOD SED  07/18/2016   IR ANGIO VERTEBRAL SEL VERTEBRAL BILAT MOD SED  07/18/2016   PARTIAL HYSTERECTOMY  1972   TONSILLECTOMY AND ADENOIDECTOMY  1945   Family History  Problem Relation Age of Onset   Asthma Mother    Diabetes Mother    Cataracts Mother    Heart disease Father    Diabetes Father    Hypertension Father    Cancer Paternal Aunt        Liver   Diabetes Paternal Grandmother    Diabetes Sister    Retinal degeneration Sister    Glaucoma Sister    Social History   Socioeconomic History   Marital status: Married    Spouse name: Not on file   Number of children: Not on file   Years of education: Not on file   Highest education level: Not on file  Occupational History   Not on  file  Tobacco Use   Smoking status: Never   Smokeless tobacco: Never  Vaping Use   Vaping Use: Unknown  Substance and Sexual Activity   Alcohol use: No    Alcohol/week: 0.0 standard drinks of alcohol   Drug use: No   Sexual activity: Not on file  Other Topics Concern   Not on file  Social History Narrative   Not on file   Social Determinants of Health   Financial Resource Strain: Low Risk  (05/05/2022)   Overall Financial Resource Strain (CARDIA)    Difficulty of Paying Living Expenses: Not hard at all  Food Insecurity: No Food Insecurity (05/05/2022)   Hunger Vital Sign    Worried About Running Out of Food in the Last Year: Never true    Ran Out of Food in the Last Year: Never true  Transportation Needs: No Transportation Needs (05/05/2022)   PRAPARE - Hydrologist (Medical): No    Lack of Transportation (Non-Medical): No  Physical Activity: Insufficiently Active (05/05/2022)   Exercise Vital Sign    Days of Exercise per Week: 3 days    Minutes of Exercise per Session: 30 min  Stress: No Stress Concern Present (05/05/2022)   [place]    Feeling of Stress : Not at all  Social Connections: Unknown (05/05/2022)   Social Connection and Isolation Panel [NHANES]    Frequency of Communication with Friends and Family: More than three times a week    Frequency of Social Gatherings with Friends and Family: More than three times a week    Attends Religious Services: Not on Advertising copywriter or Organizations: Not on file    Attends Archivist Meetings: Not on file    Marital Status: Married    Tobacco Counseling Counseling given: Not Answered   Clinical Intake:  Pre-visit preparation completed: Yes        Diabetes: No  How often do you need to have someone help you when you read instructions, pamphlets, or other written materials from your doctor or pharmacy?: 1  - Never    Interpreter Needed?: No      Activities of Daily Living    05/05/2022    1:47 PM  In your present state of health, do you have any difficulty performing the following activities:  Hearing? 0  Vision? 0  Difficulty concentrating or making decisions? 0  Walking or climbing stairs? 0  Dressing or bathing? 0  Doing errands,  shopping? 0  Preparing Food and eating ? Y  Comment Daughter assist with meal prep. Self feeds.  Using the Toilet? N  In the past six months, have you accidently leaked urine? N  Do you have problems with loss of bowel control? N  Managing your Medications? Y  Managing your Finances? N  Housekeeping or managing your Housekeeping? N    Patient Care Team: [name], MD as PCP - General (Internal Medicine)  Indicate any recent Medical Services you may have received from other than [facility] providers in the past year (date may be approximate).     Assessment:   This is a routine wellness examination for Beth Espinoza.  I connected with  [name] on 05/05/22 by a audio enabled telemedicine application and verified that I am speaking with the correct person using two identifiers.  Patient Location: Home  Provider Location: Office/Clinic  I discussed the limitations of evaluation and management by telemedicine. The patient expressed understanding and agreed to proceed.   Hearing/Vision screen Hearing Screening - Comments:: Patient is able to hear conversational tones without difficulty. No issues reported. Vision Screening - Comments:: Followed by [facility] Wears corrective lenses when using ipad Cataract extraction, bilateral    Dietary issues and exercise activities discussed: Current Exercise Habits: Home exercise routine, Type of exercise: walking, Time (Minutes): 30, Frequency (Times/Week): 3, Weekly Exercise (Minutes/Week): 90, Intensity: Mild   Goals Addressed             This Visit's Progress    Maintain healthy lifestyle        Walk for exercise Healthy diet        Depression Screen    05/05/2022    1:45 PM 02/06/2022    3:47 PM 09/17/2021   11:29 AM 05/28/2021    1:49 PM 04/02/2020   11:14 AM 02/17/2020    2:44 PM 01/04/2019   11:52 AM  PHQ 2/9 Scores  PHQ - 2 Score 0 2 0 0 0 0 1  PHQ- 9 Score  7         Fall Risk    05/05/2022    1:47 PM 02/06/2022    3:47 PM 09/17/2021   11:29 AM 05/28/2021    1:49 PM 04/02/2020   11:16 AM  [place] in the past year?  0 0 0 0  Number falls in past yr:  0   0  Injury with Fall?  1   0  Risk for fall due to :  History of fall(s);Impaired balance/gait No Fall Risks No Fall Risks   Follow up Falls evaluation completed;Falls prevention discussed Falls evaluation completed Falls evaluation completed Falls evaluation completed Falls evaluation completed    FALL RISK PREVENTION PERTAINING TO THE HOME: Home free of loose throw rugs in walkways, pet beds, electrical cords, etc? Yes  Adequate lighting in your home to reduce risk of falls? Yes   ASSISTIVE DEVICES UTILIZED TO PREVENT FALLS: Life alert? No  Use of a cane, walker or w/c? No  Grab bars in the bathroom? No  Shower chair or bench in shower? No  Elevated toilet seat or a handicapped toilet? No   TIMED UP AND GO: Was the test performed? No .   Cognitive Function:    12/30/2016    1:55 PM  MMSE - Mini Mental State Exam  Orientation to time 5  Orientation to Place 5  Registration 3  Attention/ Calculation 5  Recall 3  Language- name  2 objects 2  Language- repeat 1  Language- follow 3 step command 3  Language- read & follow direction 1  Write a sentence 1  Copy design 1  Total score 30        05/05/2022    1:49 PM 01/04/2019   12:03 PM 12/31/2017    1:08 PM  6CIT Screen  What Year? 0 points 0 points 0 points  What month? 0 points 0 points 0 points  What time? 0 points 0 points 0 points  Count back from 20 0 points 0 points 0 points  Months in reverse 0 points 0 points 0 points  Repeat  phrase 4 points 0 points 0 points  Total Score 4 points 0 points 0 points    Immunizations Immunization History  Administered Date(s) Administered   Influenza Split 12/20/2013   Influenza,inj,Quad PF,6+ Mos 01/31/2013, 12/31/2017, 01/04/2019   Influenza-Unspecified 12/01/2013, 12/13/2014, 12/23/2016   Tdap 08/11/2017, 04/14/2018   Screening Tests Health Maintenance  Topic Date Due   Zoster Vaccines- Shingrix (1 of 2) 05/08/2022 (Originally 08/16/1987)   Pneumonia Vaccine 51+ Years old (1 of 1 - PCV) 05/29/2022 (Originally 08/16/2002)   INFLUENZA VACCINE  06/01/2022 (Originally 10/01/2021)   MAMMOGRAM  02/05/2025 (Originally 03/09/2014)   Medicare Annual Wellness (AWV)  05/05/2023   DTaP/Tdap/Td (3 - Td or Tdap) 04/14/2028   DEXA SCAN  Completed   HPV VACCINES  Aged Out   COVID-19 Vaccine  Discontinued    Health Maintenance There are no preventive care reminders to display for this patient.  Lung Cancer Screening: (Low Dose CT Chest recommended if Age 57-80 years, 30 pack-year currently smoking OR have quit w/in 15years.) does not qualify.   Hepatitis C Screening: does not qualify.  Vision Screening: Recommended annual ophthalmology exams for early detection of glaucoma and other disorders of the eye.  Dental Screening: Recommended annual dental exams for proper oral hygiene  Community Resource Referral / Chronic Care Management: CRR required this visit?  No   CCM required this visit?  No      Plan:     I have personally reviewed and noted the following in the patient's chart:   Medical and social history Use of alcohol, tobacco or illicit drugs  Current medications and supplements including opioid prescriptions. Patient is not currently taking opioid prescriptions. Functional ability and status Nutritional status Physical activity Advanced directives List of other physicians Hospitalizations, surgeries, and ER visits in previous 12 months Vitals Screenings to  include cognitive, depression, and falls Referrals and appointments  In addition, I have reviewed and discussed with patient certain preventive protocols, quality metrics, and best practice recommendations. A written personalized care plan for preventive services as well as general preventive health recommendations were provided to patient.     Leta Jungling, LPN   [id number]

## 2022-05-18 ENCOUNTER — Encounter (INDEPENDENT_AMBULATORY_CARE_PROVIDER_SITE_OTHER): Payer: Self-pay | Admitting: Nurse Practitioner

## 2022-05-18 NOTE — Progress Notes (Signed)
Subjective:    [name], female    DOB: 1935-08-20, 87 y.o.   MRN: [id number] Chief Complaint  Patient presents with   New Patient (Initial Visit)    np. carotid + AAA + consult. (LS 1.12.21 FB). AAA w/o rupture + bilateral carotid artery disease. referred by scott,charlene.    Beth Espinoza is a 87 year old female who returns today for follow-up at the request of her primary care provider in regards to her carotid artery disease as well as her ascending aortic aneurysm.  The patient's last follow-up for her aortic aneurysm was in 2021 which showed a ectatic ascending aortic aneurysm at 3.7 cm.  The patient had a ultrasound today of her abdominal aorta which shows no abdominal aortic aneurysm.  The patient also has a history of carotid artery disease but she currently denies any TIA or CVA-like symptoms.  No amaurosis fugax.  Previous study showed a 1 to 39% stenosis in the bilateral internal carotid arteries.  Today they are noted to be near normal with very minimal wall thickening bilaterally.    Review of Systems  All other systems reviewed and are negative.      Objective:   Physical Exam Vitals reviewed.  HENT:     Head: Normocephalic.  Neck:     Vascular: No carotid bruit.  Cardiovascular:     Rate and Rhythm: Normal rate.     Pulses: Normal pulses.  Pulmonary:     Effort: Pulmonary effort is normal.  Skin:    General: Skin is warm and dry.  Neurological:     Mental Status: She is alert and oriented to person, place, and time.  Psychiatric:        Mood and Affect: Mood normal.        Behavior: Behavior normal.        Thought Content: Thought content normal.        Judgment: Judgment normal.     BP 120/64 (BP Location: Right Arm)   Pulse 64   Resp 16   Ht 5\' 5"  (1.651 m)   Wt 146 lb 3.2 oz (66.3 kg)   BMI 24.33 kg/m   Past Medical History:  Diagnosis Date   Anxiety    Arthritis    Cataract    History of chicken pox    History of kidney stones  October 2013   Hx: UTI (urinary tract infection)    Hyperlipidemia    Hypertension    Rheumatoid arthritis ([place])    Stroke ([place])    tia    Social History   Socioeconomic History   Marital status: Married    Spouse name: Not on file   Number of children: Not on file   Years of education: Not on file   Highest education level: Not on file  Occupational History   Not on file  Tobacco Use   Smoking status: Never   Smokeless tobacco: Never  Vaping Use   Vaping Use: Unknown  Substance and Sexual Activity   Alcohol use: No    Alcohol/week: 0.0 standard drinks of alcohol   Drug use: No   Sexual activity: Not on file  Other Topics Concern   Not on file  Social History Narrative   Not on file   Social Determinants of Health   Financial Resource Strain: Low Risk  (05/05/2022)   Overall Financial Resource Strain (CARDIA)    Difficulty of Paying Living Expenses: Not hard at all  Food Insecurity: No  Food Insecurity (05/05/2022)   Hunger Vital Sign    Worried About Running Out of Food in the Last Year: Never true    Ran Out of Food in the Last Year: Never true  Transportation Needs: No Transportation Needs (05/05/2022)   PRAPARE - Hydrologist (Medical): No    Lack of Transportation (Non-Medical): No  Physical Activity: Insufficiently Active (05/05/2022)   Exercise Vital Sign    Days of Exercise per Week: 3 days    Minutes of Exercise per Session: 30 min  Stress: No Stress Concern Present (05/05/2022)   [place]    Feeling of Stress : Not at all  Social Connections: Unknown (05/05/2022)   Social Connection and Isolation Panel [NHANES]    Frequency of Communication with Friends and Family: More than three times a week    Frequency of Social Gatherings with Friends and Family: More than three times a week    Attends Religious Services: Not on file    Active Member of Clubs or Organizations:  Not on file    Attends Archivist Meetings: Not on file    Marital Status: Married  Intimate Partner Violence: Not At Risk (05/05/2022)   Humiliation, Afraid, Rape, and Kick questionnaire    Fear of Current or Ex-Partner: No    Emotionally Abused: No    Physically Abused: No    Sexually Abused: No    Past Surgical History:  Procedure Laterality Date   ABDOMINAL HYSTERECTOMY     BREAST BIOPSY Right 1995   CATARACT EXTRACTION W/PHACO Left 04/13/2018   Procedure: CATARACT EXTRACTION PHACO AND INTRAOCULAR LENS PLACEMENT ([place]) Left Eye;  Surgeon: Birder Robson, MD;  Location: [facility] ORS;  Service: Ophthalmology;  Laterality: Left;  [place]  00:47.0 CDE 5.52 Fluid pack lot # [id number]   EYE SURGERY     IR ANGIO INTRA EXTRACRAN SEL COM CAROTID INNOMINATE BILAT MOD SED  07/18/2016   IR ANGIO VERTEBRAL SEL VERTEBRAL BILAT MOD SED  07/18/2016   PARTIAL HYSTERECTOMY  1972   TONSILLECTOMY AND ADENOIDECTOMY  1945    Family History  Problem Relation Age of Onset   Asthma Mother    Diabetes Mother    Cataracts Mother    Heart disease Father    Diabetes Father    Hypertension Father    Cancer Paternal Aunt        Liver   Diabetes Paternal Grandmother    Diabetes Sister    Retinal degeneration Sister    Glaucoma Sister     Allergies  Allergen Reactions   Pneumovax [Pneumococcal Polysaccharide Vaccine] Swelling   Pneumococcal Vaccine Swelling   Reclast [Zoledronic Acid]        Latest Ref Rng & Units 05/28/2021    2:39 PM 02/17/2020    3:42 PM 01/16/2019    4:37 AM  CBC  WBC 4.0 - 10.5 K/uL 9.4  9.4  8.2   Hemoglobin 12.0 - 15.0 g/dL 13.4  14.1  12.6   Hematocrit 36.0 - 46.0 % 40.9  41.8  38.5   Platelets 150.0 - 400.0 K/uL 306.0  309  230       CMP     Component Value Date/Time   NA 139 05/28/2021 1439   NA 139 03/27/2011 1618   K 4.0 05/28/2021 1439   K 3.9 03/27/2011 1618   CL 105 05/28/2021 1439   CL 101 03/27/2011 1618   CO2 24 05/28/2021  1439   CO2 26  03/27/2011 1618   GLUCOSE 137 (H) 05/28/2021 1439   GLUCOSE 113 (H) 03/27/2011 1618   BUN 19 05/28/2021 1439   BUN 16 03/27/2011 1618   CREATININE 0.98 05/28/2021 1439   CREATININE 1.06 (H) 02/17/2020 1542   CALCIUM 9.6 05/28/2021 1439   CALCIUM 9.0 03/27/2011 1618   PROT 6.7 05/28/2021 1439   PROT 7.6 03/27/2011 1618   ALBUMIN 4.4 05/28/2021 1439   ALBUMIN 3.6 03/27/2011 1618   AST 16 05/28/2021 1439   AST 38 (H) 03/27/2011 1618   ALT 11 05/28/2021 1439   ALT 30 03/27/2011 1618   ALKPHOS 44 05/28/2021 1439   ALKPHOS 61 03/27/2011 1618   BILITOT 0.4 05/28/2021 1439   BILITOT 0.4 03/27/2011 1618   GFRNONAA 53 (L) 01/16/2019 0437   GFRNONAA 60 (L) 03/27/2011 1618   GFRAA >60 01/16/2019 0437   GFRAA >60 03/27/2011 1618     No results found.     Assessment & Plan:   1. Bilateral carotid artery disease, unspecified type ([place]) The patient has normal extracranial vessels with no significant stenosis of the bilateral internal carotid arteries.  The bilateral vertebral arteries have antegrade flow with normal flow hemodynamics in the bilateral subclavian arteries.  Based on this it is unlikely that the patient will have significant progression in the next several years.  Based on the patient's age as well as due to the patient's wishes, we will follow-up with her on an as-needed basis.  2. Aneurysm of ascending aorta without rupture (HCC) Today noninvasive studies show no evidence of an abdominal aortic aneurysm.  Previous thoracic aortic aneurysm only measured 3.7 cm which is technically ectatic.  Given the patient's age and in accordance with her wishes,  We will have the patient follow-up with [place] on an as-needed basis as based on the size the likelihood of rupture is drastically decreased.  3. Essential hypertension Continue antihypertensive medications as already ordered, these medications have been reviewed and there are no changes at this time.   Current Outpatient Medications  on File Prior to Visit  Medication Sig Dispense Refill   ascorbic acid (VITAMIN C) 1000 MG tablet Take 1,000 mg by mouth daily.     cholecalciferol (VITAMIN D) 25 MCG tablet Take 5 tablets (5,000 Units total) by mouth daily. 30 tablet 1   folic acid (FOLVITE) 1 MG tablet Take 2 mg by mouth daily.      magnesium oxide (MAG-OX) 400 MG tablet Take by mouth.     PARoxetine (PAXIL) 20 MG tablet Take 1 tablet (20 mg total) by mouth daily. 90 tablet 1   QUEtiapine (SEROQUEL) 25 MG tablet Take 1 tablet (25 mg total) by mouth at bedtime. 30 tablet 1   vitamin E 180 MG (400 UNITS) capsule Take by mouth.     ALPRAZolam (XANAX) 0.5 MG tablet Take 1 tablet (0.5 mg total) by mouth daily as needed for anxiety. 15 tablet 0   No current facility-administered medications on file prior to visit.    There are no Patient Instructions on file for this visit. No follow-ups on file.   Kris Hartmann, NP

## 2022-06-05 ENCOUNTER — Other Ambulatory Visit: Payer: Self-pay | Admitting: Internal Medicine

## 2022-06-06 ENCOUNTER — Other Ambulatory Visit: Payer: Self-pay

## 2022-06-06 NOTE — Telephone Encounter (Signed)
Ok to fill? Last seen in December 2023

## 2022-06-07 ENCOUNTER — Other Ambulatory Visit: Payer: Self-pay | Admitting: Internal Medicine

## 2022-06-07 MED ORDER — QUETIAPINE FUMARATE 25 MG PO TABS
25.0000 mg | ORAL_TABLET | Freq: Every day | ORAL | 0 refills | Status: DC
  Filled 2022-06-07: qty 30, 30d supply, fill #0

## 2022-06-07 NOTE — Telephone Encounter (Signed)
Ok'd the rx for seroquel #30 with no refills.  Needs a f/u appt scheduled with me prior to next refill.

## 2022-06-08 ENCOUNTER — Other Ambulatory Visit: Payer: Self-pay

## 2022-07-01 ENCOUNTER — Ambulatory Visit: Payer: PPO | Admitting: Internal Medicine

## 2022-07-01 NOTE — Progress Notes (Deleted)
Subjective:    [name], female    DOB: 04-04-1935, 87 y.o.   MRN: [id number]  Patient here for No chief complaint on file.   HPI Here for a scheduled follow up.     Past Medical History:  Diagnosis Date   Anxiety    Arthritis    Cataract    History of chicken pox    History of kidney stones October 2013   Hx: UTI (urinary tract infection)    Hyperlipidemia    Hypertension    Rheumatoid arthritis (HCC)    Stroke (HCC)    tia   Past Surgical History:  Procedure Laterality Date   ABDOMINAL HYSTERECTOMY     BREAST BIOPSY Right 1995   CATARACT EXTRACTION W/PHACO Left 04/13/2018   Procedure: CATARACT EXTRACTION PHACO AND INTRAOCULAR LENS PLACEMENT ([facility]) Left Eye;  Surgeon: Galen Manila, MD;  Location: [facility] ORS;  Service: Ophthalmology;  Laterality: Left;  [place]  00:47.0 CDE 5.52 Fluid pack lot # [id number]   EYE SURGERY     IR ANGIO INTRA EXTRACRAN SEL COM CAROTID INNOMINATE BILAT MOD SED  07/18/2016   IR ANGIO VERTEBRAL SEL VERTEBRAL BILAT MOD SED  07/18/2016   PARTIAL HYSTERECTOMY  1972   TONSILLECTOMY AND ADENOIDECTOMY  1945   Family History  Problem Relation Age of Onset   Asthma Mother    Diabetes Mother    Cataracts Mother    Heart disease Father    Diabetes Father    Hypertension Father    Cancer Paternal Aunt        Liver   Diabetes Paternal Grandmother    Diabetes Sister    Retinal degeneration Sister    Glaucoma Sister    Social History   Socioeconomic History   Marital status: Married    Spouse name: Not on file   Number of children: Not on file   Years of education: Not on file   Highest education level: Not on file  Occupational History   Not on file  Tobacco Use   Smoking status: Never   Smokeless tobacco: Never  Vaping Use   Vaping Use: Unknown  Substance and Sexual Activity   Alcohol use: No    Alcohol/week: 0.0 standard drinks of alcohol   Drug use: No   Sexual activity: Not on file  Other Topics Concern   Not on file   Social History Narrative   Not on file   Social Determinants of Health   Financial Resource Strain: Low Risk  (05/05/2022)   Overall Financial Resource Strain (CARDIA)    Difficulty of Paying Living Expenses: Not hard at all  Food Insecurity: No Food Insecurity (05/05/2022)   Hunger Vital Sign    Worried About Running Out of Food in the Last Year: Never true    Ran Out of Food in the Last Year: Never true  Transportation Needs: No Transportation Needs (05/05/2022)   PRAPARE - Administrator, Civil Service (Medical): No    Lack of Transportation (Non-Medical): No  Physical Activity: Insufficiently Active (05/05/2022)   Exercise Vital Sign    Days of Exercise per Week: 3 days    Minutes of Exercise per Session: 30 min  Stress: No Stress Concern Present (05/05/2022)   [employer] of Occupational Health - Occupational Stress Questionnaire    Feeling of Stress : Not at all  Social Connections: Unknown (05/05/2022)   Social Connection and Isolation Panel [NHANES]    Frequency of Communication with  Friends and Family: More than three times a week    Frequency of Social Gatherings with Friends and Family: More than three times a week    Attends Religious Services: Not on Marketing executive or Organizations: Not on file    Attends Banker Meetings: Not on file    Marital Status: Married     Review of Systems     Objective:     There were no vitals taken for this visit. Wt Readings from Last 3 Encounters:  05/05/22 146 lb (66.2 kg)  04/30/22 146 lb 3.2 oz (66.3 kg)  02/06/22 145 lb 6.4 oz (66 kg)    Physical Exam   Outpatient Encounter Medications as of 07/01/2022  Medication Sig   amLODipine (NORVASC) 5 MG tablet Take 1 tablet (5 mg total) by mouth daily.   ascorbic acid (VITAMIN C) 1000 MG tablet Take 1,000 mg by mouth daily.   atenolol (TENORMIN) 25 MG tablet Take 1 tablet (25 mg total) by mouth daily.   cholecalciferol (VITAMIN D) 25 MCG  tablet Take 5 tablets (5,000 Units total) by mouth daily.   clopidogrel (PLAVIX) 75 MG tablet Take 1 tablet (75 mg total) by mouth daily.   folic acid (FOLVITE) 1 MG tablet Take 2 mg by mouth daily.    magnesium oxide (MAG-OX) 400 MG tablet Take by mouth.   PARoxetine (PAXIL) 20 MG tablet Take 1 tablet (20 mg total) by mouth daily.   QUEtiapine (SEROQUEL) 25 MG tablet Take 1 tablet (25 mg total) by mouth at bedtime.   vitamin E 180 MG (400 UNITS) capsule Take by mouth.   No facility-administered encounter medications on file as of 07/01/2022.     Lab Results  Component Value Date   WBC 9.4 05/28/2021   HGB 13.4 05/28/2021   HCT 40.9 05/28/2021   PLT 306.0 05/28/2021   GLUCOSE 137 (H) 05/28/2021   CHOL 254 (H) 05/28/2021   TRIG 235.0 (H) 05/28/2021   HDL 39.70 05/28/2021   LDLDIRECT 181.0 05/28/2021   LDLCALC 218 (H) 08/12/2018   ALT 11 05/28/2021   AST 16 05/28/2021   NA 139 05/28/2021   K 4.0 05/28/2021   CL 105 05/28/2021   CREATININE 0.98 05/28/2021   BUN 19 05/28/2021   CO2 24 05/28/2021   TSH 1.69 02/17/2020   INR 1.07 07/18/2016   HGBA1C 6.1 05/28/2021    No results found.     Assessment & Plan:  There are no diagnoses linked to this encounter.   Dale Whitfield, MD

## 2022-07-02 ENCOUNTER — Other Ambulatory Visit: Payer: Self-pay

## 2022-07-02 ENCOUNTER — Other Ambulatory Visit: Payer: Self-pay | Admitting: Internal Medicine

## 2022-07-04 ENCOUNTER — Other Ambulatory Visit: Payer: Self-pay

## 2022-07-07 ENCOUNTER — Other Ambulatory Visit: Payer: Self-pay

## 2022-07-08 ENCOUNTER — Other Ambulatory Visit: Payer: Self-pay

## 2022-07-09 ENCOUNTER — Other Ambulatory Visit: Payer: Self-pay

## 2022-07-09 ENCOUNTER — Encounter: Payer: Self-pay | Admitting: Internal Medicine

## 2022-07-10 ENCOUNTER — Other Ambulatory Visit: Payer: Self-pay

## 2022-07-10 ENCOUNTER — Ambulatory Visit (INDEPENDENT_AMBULATORY_CARE_PROVIDER_SITE_OTHER): Payer: PPO | Admitting: Internal Medicine

## 2022-07-10 VITALS — BP 124/70 | HR 74 | Temp 98.0°F | Resp 16 | Ht 65.0 in | Wt 147.2 lb

## 2022-07-10 DIAGNOSIS — H9190 Unspecified hearing loss, unspecified ear: Secondary | ICD-10-CM

## 2022-07-10 DIAGNOSIS — M353 Polymyalgia rheumatica: Secondary | ICD-10-CM

## 2022-07-10 DIAGNOSIS — F419 Anxiety disorder, unspecified: Secondary | ICD-10-CM

## 2022-07-10 DIAGNOSIS — M810 Age-related osteoporosis without current pathological fracture: Secondary | ICD-10-CM

## 2022-07-10 DIAGNOSIS — I10 Essential (primary) hypertension: Secondary | ICD-10-CM | POA: Diagnosis not present

## 2022-07-10 DIAGNOSIS — F0150 Vascular dementia without behavioral disturbance: Secondary | ICD-10-CM

## 2022-07-10 DIAGNOSIS — R739 Hyperglycemia, unspecified: Secondary | ICD-10-CM | POA: Diagnosis not present

## 2022-07-10 DIAGNOSIS — Z86010 Personal history of colonic polyps: Secondary | ICD-10-CM

## 2022-07-10 DIAGNOSIS — I779 Disorder of arteries and arterioles, unspecified: Secondary | ICD-10-CM | POA: Diagnosis not present

## 2022-07-10 DIAGNOSIS — I7121 Aneurysm of the ascending aorta, without rupture: Secondary | ICD-10-CM | POA: Diagnosis not present

## 2022-07-10 DIAGNOSIS — E7800 Pure hypercholesterolemia, unspecified: Secondary | ICD-10-CM

## 2022-07-10 DIAGNOSIS — G479 Sleep disorder, unspecified: Secondary | ICD-10-CM | POA: Diagnosis not present

## 2022-07-10 DIAGNOSIS — F0280 Dementia in other diseases classified elsewhere without behavioral disturbance: Secondary | ICD-10-CM

## 2022-07-10 MED ORDER — AMLODIPINE BESYLATE 5 MG PO TABS
5.0000 mg | ORAL_TABLET | Freq: Every day | ORAL | 0 refills | Status: DC
  Filled 2022-07-10: qty 90, 90d supply, fill #0

## 2022-07-10 MED ORDER — CLOPIDOGREL BISULFATE 75 MG PO TABS
75.0000 mg | ORAL_TABLET | Freq: Every day | ORAL | 0 refills | Status: DC
  Filled 2022-07-10: qty 90, 90d supply, fill #0

## 2022-07-10 MED ORDER — PAROXETINE HCL 20 MG PO TABS
20.0000 mg | ORAL_TABLET | Freq: Every day | ORAL | 1 refills | Status: DC
  Filled 2022-07-10 – 2022-10-13 (×2): qty 90, 90d supply, fill #0
  Filled 2023-01-13: qty 90, 90d supply, fill #1

## 2022-07-10 MED ORDER — QUETIAPINE FUMARATE 25 MG PO TABS
25.0000 mg | ORAL_TABLET | Freq: Every day | ORAL | 0 refills | Status: DC
  Filled 2022-07-10: qty 30, 30d supply, fill #0

## 2022-07-10 MED ORDER — ATENOLOL 25 MG PO TABS
25.0000 mg | ORAL_TABLET | Freq: Every day | ORAL | 0 refills | Status: DC
  Filled 2022-07-10: qty 90, 90d supply, fill #0

## 2022-07-10 NOTE — Progress Notes (Incomplete)
Subjective:    [name], female    DOB: Jan 05, 1936, 87 y.o.   MRN: [id number]  Patient here for  Chief Complaint  Patient presents with  . Medical Management of Chronic Issues    HPI Here to follow up regarding cognitive issues, cholesterol and f/u regarding previous scans. She is accompanied by her daughter. History obtained from both of them. Last visit reported concerns regarding memory and was having issues with sleep.  Was placed on seroquel.  Sleeping well.  Daughter reports memory is better.  Feel overall she is doing well.  Stays active.  No chest pain or sob reported.  Eating.  No nausea or vomiting.  No bowel change reported.  Saw [facility] - 04/30/22 - f/u bilateral carotid artery disease and f/u aneurysm of ascending aorta.  Carotid ultrasound - normal extracranial vessels with no significant stenosis of the bilateral internal carotid arteries. The bilateral vertebral arteries have antegrade flow with normal flow hemodynamics in the bilateral subclavian arteries. Aorta - noninvasive studies show no evidence of an abdominal aortic aneurysm. Previous thoracic aortic aneurysm only measured 3.7 cm which is technically ectatic. Recommended f/u prn.    Past Medical History:  Diagnosis Date  . Anxiety   . Arthritis   . Cataract   . History of chicken pox   . History of kidney stones October 2013  . Hx: UTI (urinary tract infection)   . Hyperlipidemia   . Hypertension   . Rheumatoid arthritis (HCC)   . Stroke [facility])    tia   Past Surgical History:  Procedure Laterality Date  . ABDOMINAL HYSTERECTOMY    . BREAST BIOPSY Right 1995  . CATARACT EXTRACTION W/PHACO Left 04/13/2018   Procedure: CATARACT EXTRACTION PHACO AND INTRAOCULAR LENS PLACEMENT ([facility]) Left Eye;  Surgeon: Galen Manila, MD;  Location: [facility] OR[facility];  Service: Ophthalmology;  Laterality: Left;  [place]  00:47.0 CDE 5.52 Fluid pack lot # [id number]  . EYE SURGERY    . IR ANGIO INTRA EXTRACRAN SEL COM CAROTID  INNOMINATE BILAT MOD SED  07/18/2016  . IR ANGIO VERTEBRAL SEL VERTEBRAL BILAT MOD SED  07/18/2016  . PARTIAL HYSTERECTOMY  1972  . TONSILLECTOMY AND ADENOIDECTOMY  1945   Family History  Problem Relation Age of Onset  . Asthma Mother   . Diabetes Mother   . Cataracts Mother   . Heart disease Father   . Diabetes Father   . Hypertension Father   . Cancer Paternal Aunt        Liver  . Diabetes Paternal Grandmother   . Diabetes Sister   . Retinal degeneration Sister   . Glaucoma Sister    Social History   Socioeconomic History  . Marital status: Married    Spouse name: Not on file  . Number of children: Not on file  . Years of education: Not on file  . Highest education level: Bachelor's degree (e.g., BA, AB, BS)  Occupational History  . Not on file  Tobacco Use  . Smoking status: Never  . Smokeless tobacco: Never  Vaping Use  . Vaping Use: Unknown  Substance and Sexual Activity  . Alcohol use: No    Alcohol/week: 0.0 standard drinks of alcohol  . Drug use: No  . Sexual activity: Not on file  Other Topics Concern  . Not on file  Social History Narrative  . Not on file   Social Determinants of Health   Financial Resource Strain: Low Risk  (07/10/2022)   Overall  Financial Resource Strain (CARDIA)   . Difficulty of Paying Living Expenses: Not hard at all  Food Insecurity: No Food Insecurity (07/10/2022)   Hunger Vital Sign   . Worried About Programme researcher, broadcasting/film/video in the Last Year: Never true   . Ran Out of Food in the Last Year: Never true  Transportation Needs: No Transportation Needs (07/10/2022)   PRAPARE - Transportation   . Lack of Transportation (Medical): No   . Lack of Transportation (Non-Medical): No  Physical Activity: Insufficiently Active (07/10/2022)   Exercise Vital Sign   . Days of Exercise per Week: 2 days   . Minutes of Exercise per Session: 10 min  Stress: No Stress Concern Present (07/10/2022)   [employer] of Occupational Health - Occupational  Stress Questionnaire   . Feeling of Stress : Only a little  Social Connections: Moderately Isolated (07/10/2022)   Social Connection and Isolation Panel [NHANES]   . Frequency of Communication with Friends and Family: Once a week   . Frequency of Social Gatherings with Friends and Family: Once a week   . Attends Religious Services: 1 to 4 times per year   . Active Member of Clubs or Organizations: No   . Attends Banker Meetings: Not on file   . Marital Status: Married     Review of Systems     Objective:     BP 124/70   Pulse 74   Temp 98 F (36.7 C)   Resp 16   Ht 5\' 5"  (1.651 m)   Wt 147 lb 3.2 oz (66.8 kg)   SpO2 98%   BMI 24.50 kg/m  Wt Readings from Last 3 Encounters:  07/10/22 147 lb 3.2 oz (66.8 kg)  05/05/22 146 lb (66.2 kg)  04/30/22 146 lb 3.2 oz (66.3 kg)    Physical Exam   Outpatient Encounter Medications as of 07/10/2022  Medication Sig  . amLODipine (NORVASC) 5 MG tablet Take 1 tablet (5 mg total) by mouth daily.  Marland Kitchen ascorbic acid (VITAMIN C) 1000 MG tablet Take 1,000 mg by mouth daily.  Marland Kitchen atenolol (TENORMIN) 25 MG tablet Take 1 tablet (25 mg total) by mouth daily.  . cholecalciferol (VITAMIN D) 25 MCG tablet Take 5 tablets (5,000 Units total) by mouth daily.  . clopidogrel (PLAVIX) 75 MG tablet Take 1 tablet (75 mg total) by mouth daily.  . folic acid (FOLVITE) 1 MG tablet Take 2 mg by mouth daily.   . magnesium oxide (MAG-OX) 400 MG tablet Take by mouth.  Marland Kitchen PARoxetine (PAXIL) 20 MG tablet Take 1 tablet (20 mg total) by mouth daily.  . QUEtiapine (SEROQUEL) 25 MG tablet Take 1 tablet (25 mg total) by mouth at bedtime.   No facility-administered encounter medications on file as of 07/10/2022.     Lab Results  Component Value Date   WBC 9.4 05/28/2021   HGB 13.4 05/28/2021   HCT 40.9 05/28/2021   PLT 306.0 05/28/2021   GLUCOSE 137 (H) 05/28/2021   CHOL 254 (H) 05/28/2021   TRIG 235.0 (H) 05/28/2021   HDL 39.70 05/28/2021   LDLDIRECT  181.0 05/28/2021   LDLCALC 218 (H) 08/12/2018   ALT 11 05/28/2021   AST 16 05/28/2021   NA 139 05/28/2021   K 4.0 05/28/2021   CL 105 05/28/2021   CREATININE 0.98 05/28/2021   BUN 19 05/28/2021   CO2 24 05/28/2021   TSH 1.69 02/17/2020   INR 1.07 07/18/2016   HGBA1C 6.1 05/28/2021  No results found.     Assessment & Plan:  There are no diagnoses linked to this encounter.   [name], MD

## 2022-07-10 NOTE — Telephone Encounter (Signed)
Rx sent in for seroquel.   

## 2022-07-10 NOTE — Telephone Encounter (Signed)
Rx ok'd for seroquel

## 2022-07-11 ENCOUNTER — Telehealth: Payer: Self-pay | Admitting: *Deleted

## 2022-07-11 NOTE — Telephone Encounter (Signed)
Please place future orders for lab appt.  

## 2022-07-12 ENCOUNTER — Encounter: Payer: Self-pay | Admitting: Internal Medicine

## 2022-07-13 DIAGNOSIS — H9190 Unspecified hearing loss, unspecified ear: Secondary | ICD-10-CM | POA: Insufficient documentation

## 2022-07-13 NOTE — Assessment & Plan Note (Signed)
Last colonoscopy 12/2012.  Recommended f/u in 5 years.  Have discussed f/u.  Declines.  ?

## 2022-07-13 NOTE — Assessment & Plan Note (Signed)
Saw [facility] 04/30/22 - Aorta - noninvasive studies show no evidence of an abdominal aortic aneurysm. Previous thoracic aortic aneurysm only measured 3.7 cm which is technically ectatic. Recommended f/u prn.

## 2022-07-13 NOTE — Assessment & Plan Note (Signed)
Continue amlodipine and atenolol.   Follow blood pressure.  Follow metabolic panel.  

## 2022-07-13 NOTE — Assessment & Plan Note (Signed)
Low carb diet and exercise.  Follow met b and a1c.   

## 2022-07-13 NOTE — Assessment & Plan Note (Signed)
Doing better on seroquel.  Follow

## 2022-07-13 NOTE — Assessment & Plan Note (Signed)
Continues on paxil.  Has tapered xanax.  Not taking now.  Ftakin seroquel q hs.  Helping. Follow

## 2022-07-13 NOTE — Assessment & Plan Note (Signed)
Off MTX.  No pain reported.  Follow.

## 2022-07-13 NOTE — Assessment & Plan Note (Signed)
Previously saw neurology.  Daughter feels memory is better since starting seroquel and sleeping well.  Follow.

## 2022-07-13 NOTE — Telephone Encounter (Signed)
Orders placed for labs

## 2022-07-13 NOTE — Assessment & Plan Note (Signed)
Intolerance to zetia and crestor. Has previously declined cholesterol medication.  Discussed today regarding repatha.  Wants to check cholesterol.  May consider starting.  Schedule to check lipid panel.  Low cholesterol diet and exercise.

## 2022-07-13 NOTE — Assessment & Plan Note (Signed)
Discussed.  Refer to ENT for hearing evaluation.

## 2022-07-13 NOTE — Assessment & Plan Note (Signed)
Evaluated [facility] 04/30/22 - Carotid ultrasound - normal extracranial vessels with no significant stenosis of the bilateral internal carotid arteries. The bilateral vertebral arteries have antegrade flow with normal flow hemodynamics in the bilateral subclavian arteries.  Recommended f/u prn.

## 2022-07-13 NOTE — Addendum Note (Signed)
Addended by: Charm Barges on: 07/13/2022 07:24 AM   Modules accepted: Orders

## 2022-07-13 NOTE — Assessment & Plan Note (Signed)
Had reaction to reclast.  Calcium, vitamin D and weight bearing exercise. Needs f/u bone density and consider prolia.

## 2022-07-14 ENCOUNTER — Other Ambulatory Visit: Payer: Self-pay

## 2022-07-16 ENCOUNTER — Other Ambulatory Visit (INDEPENDENT_AMBULATORY_CARE_PROVIDER_SITE_OTHER): Payer: PPO

## 2022-07-16 DIAGNOSIS — M810 Age-related osteoporosis without current pathological fracture: Secondary | ICD-10-CM | POA: Diagnosis not present

## 2022-07-16 DIAGNOSIS — I10 Essential (primary) hypertension: Secondary | ICD-10-CM

## 2022-07-16 DIAGNOSIS — R739 Hyperglycemia, unspecified: Secondary | ICD-10-CM | POA: Diagnosis not present

## 2022-07-16 DIAGNOSIS — E7800 Pure hypercholesterolemia, unspecified: Secondary | ICD-10-CM | POA: Diagnosis not present

## 2022-07-16 LAB — CBC WITH DIFFERENTIAL/PLATELET
Basophils Absolute: 0.1 10*3/uL (ref 0.0–0.1)
Basophils Relative: 1.2 % (ref 0.0–3.0)
Eosinophils Absolute: 0.3 10*3/uL (ref 0.0–0.7)
Eosinophils Relative: 2.4 % (ref 0.0–5.0)
HCT: 39.5 % (ref 36.0–46.0)
Hemoglobin: 13.3 g/dL (ref 12.0–15.0)
Lymphocytes Relative: 25.3 % (ref 12.0–46.0)
Lymphs Abs: 2.7 10*3/uL (ref 0.7–4.0)
MCHC: 33.6 g/dL (ref 30.0–36.0)
MCV: 87.9 fl (ref 78.0–100.0)
Monocytes Absolute: 0.7 10*3/uL (ref 0.1–1.0)
Monocytes Relative: 6.5 % (ref 3.0–12.0)
Neutro Abs: 7 10*3/uL (ref 1.4–7.7)
Neutrophils Relative %: 64.6 % (ref 43.0–77.0)
Platelets: 336 10*3/uL (ref 150.0–400.0)
RBC: 4.5 Mil/uL (ref 3.87–5.11)
RDW: 14.6 % (ref 11.5–15.5)
WBC: 10.8 10*3/uL — ABNORMAL HIGH (ref 4.0–10.5)

## 2022-07-16 LAB — LIPID PANEL
Cholesterol: 236 mg/dL — ABNORMAL HIGH (ref 0–200)
HDL: 37.5 mg/dL — ABNORMAL LOW (ref >39.00)
LDL Cholesterol: 168 mg/dL — ABNORMAL HIGH (ref 0–99)
NonHDL: 198.24
Total CHOL/HDL Ratio: 6
Triglycerides: 151 mg/dL — ABNORMAL HIGH (ref 0.0–149.0)
VLDL: 30.2 mg/dL (ref 0.0–40.0)

## 2022-07-16 LAB — HEPATIC FUNCTION PANEL
ALT: 14 U/L (ref 0–35)
AST: 20 U/L (ref 0–37)
Albumin: 4.1 g/dL (ref 3.5–5.2)
Alkaline Phosphatase: 39 U/L (ref 39–117)
Bilirubin, Direct: 0.1 mg/dL (ref 0.0–0.3)
Total Bilirubin: 0.5 mg/dL (ref 0.2–1.2)
Total Protein: 7.1 g/dL (ref 6.0–8.3)

## 2022-07-16 LAB — BASIC METABOLIC PANEL
BUN: 20 mg/dL (ref 6–23)
CO2: 25 mEq/L (ref 19–32)
Calcium: 9.5 mg/dL (ref 8.4–10.5)
Chloride: 106 mEq/L (ref 96–112)
Creatinine, Ser: 1.05 mg/dL (ref 0.40–1.20)
GFR: 48.65 mL/min — ABNORMAL LOW (ref >60.00)
Glucose, Bld: 112 mg/dL — ABNORMAL HIGH (ref 70–99)
Potassium: 3.9 mEq/L (ref 3.5–5.1)
Sodium: 139 mEq/L (ref 135–145)

## 2022-07-16 LAB — VITAMIN D 25 HYDROXY (VIT D DEFICIENCY, FRACTURES): VITD: 51.82 ng/mL (ref 30.00–100.00)

## 2022-07-16 LAB — HEMOGLOBIN A1C: Hgb A1c MFr Bld: 6.2 % (ref 4.6–6.5)

## 2022-07-16 LAB — TSH: TSH: 1.64 u[IU]/mL (ref 0.35–5.50)

## 2022-07-17 ENCOUNTER — Other Ambulatory Visit: Payer: Self-pay

## 2022-07-17 MED ORDER — REPATHA SURECLICK 140 MG/ML ~~LOC~~ SOAJ
140.0000 mg | SUBCUTANEOUS | 2 refills | Status: DC
  Filled 2022-07-17 – 2022-07-29 (×2): qty 2, 28d supply, fill #0
  Filled 2022-08-25: qty 2, 28d supply, fill #1
  Filled 2022-09-25: qty 2, 28d supply, fill #2

## 2022-07-18 ENCOUNTER — Other Ambulatory Visit: Payer: Self-pay

## 2022-07-21 ENCOUNTER — Other Ambulatory Visit: Payer: Self-pay

## 2022-07-23 ENCOUNTER — Other Ambulatory Visit: Payer: Self-pay

## 2022-07-25 ENCOUNTER — Telehealth: Payer: Self-pay

## 2022-07-25 ENCOUNTER — Other Ambulatory Visit: Payer: Self-pay

## 2022-07-25 NOTE — Telephone Encounter (Signed)
Per pharmacy Repatha requires PA.  PA started via [facility] Key: [id number]

## 2022-07-29 ENCOUNTER — Encounter: Payer: Self-pay | Admitting: Internal Medicine

## 2022-07-29 ENCOUNTER — Other Ambulatory Visit: Payer: Self-pay

## 2022-08-04 ENCOUNTER — Other Ambulatory Visit: Payer: Self-pay | Admitting: Internal Medicine

## 2022-08-05 ENCOUNTER — Other Ambulatory Visit: Payer: Self-pay

## 2022-08-05 ENCOUNTER — Other Ambulatory Visit: Payer: Self-pay | Admitting: Internal Medicine

## 2022-08-05 MED FILL — Quetiapine Fumarate Tab 25 MG: ORAL | 30 days supply | Qty: 30 | Fill #0 | Status: AC

## 2022-08-06 ENCOUNTER — Other Ambulatory Visit: Payer: Self-pay

## 2022-08-07 ENCOUNTER — Other Ambulatory Visit: Payer: Self-pay

## 2022-08-25 ENCOUNTER — Other Ambulatory Visit: Payer: Self-pay

## 2022-08-28 ENCOUNTER — Other Ambulatory Visit (HOSPITAL_COMMUNITY): Payer: Self-pay

## 2022-09-03 ENCOUNTER — Other Ambulatory Visit: Payer: Self-pay | Admitting: Internal Medicine

## 2022-09-03 MED ORDER — QUETIAPINE FUMARATE 25 MG PO TABS
25.0000 mg | ORAL_TABLET | Freq: Every day | ORAL | 1 refills | Status: DC
  Filled 2022-09-03: qty 30, 30d supply, fill #0
  Filled 2022-10-03: qty 30, 30d supply, fill #1

## 2022-09-03 NOTE — Telephone Encounter (Signed)
Rx sent in for seroquel.   

## 2022-09-04 ENCOUNTER — Other Ambulatory Visit: Payer: Self-pay

## 2022-09-28 ENCOUNTER — Encounter: Payer: PPO | Admitting: Nurse Practitioner

## 2022-09-28 ENCOUNTER — Telehealth: Payer: PPO | Admitting: Family Medicine

## 2022-09-28 DIAGNOSIS — U071 COVID-19: Secondary | ICD-10-CM | POA: Diagnosis not present

## 2022-09-28 MED ORDER — PROMETHAZINE-DM 6.25-15 MG/5ML PO SYRP: 5.0000 mL | ORAL_SOLUTION | Freq: Four times a day (QID) | ORAL | 0 refills | Status: AC | PRN

## 2022-09-28 MED ORDER — NIRMATRELVIR/RITONAVIR (PAXLOVID) TABLET (RENAL DOSING): 2.0000 | ORAL_TABLET | Freq: Two times a day (BID) | ORAL | 0 refills | Status: AC

## 2022-09-28 NOTE — Progress Notes (Signed)
I have spent 5 minutes in review of e-visit questionnaire, review and updating patient chart, medical decision making and response to patient.  ° °Zelda W Fleming, NP ° °  °

## 2022-09-28 NOTE — Progress Notes (Signed)
Virtual Visit Consent   Beth Espinoza, you are scheduled for a virtual visit with a [facility] provider today. Just as with appointments in the office, your consent must be obtained to participate. Your consent will be active for this visit and any virtual visit you may have with one of our providers in the next 365 days. If you have a MyChart account, a copy of this consent can be sent to you electronically.  As this is a virtual visit, video technology does not allow for your provider to perform a traditional examination. This may limit your provider's ability to fully assess your condition. If your provider identifies any concerns that need to be evaluated in person or the need to arrange testing (such as labs, EKG, etc.), we will make arrangements to do so. Although advances in technology are sophisticated, we cannot ensure that it will always work on either your end or our end. If the connection with a video visit is poor, the visit may have to be switched to a telephone visit. With either a video or telephone visit, we are not always able to ensure that we have a secure connection.  By engaging in this virtual visit, you consent to the provision of healthcare and authorize for your insurance to be billed (if applicable) for the services provided during this visit. Depending on your insurance coverage, you may receive a charge related to this service.  I need to obtain your verbal consent now. Are you willing to proceed with your visit today? Beth Espinoza has provided verbal consent on 09/28/2022 for a virtual visit (video or telephone). Georgana Curio, FNP  Date: 09/28/2022 11:42 AM  Virtual Visit via Video Note   I, Georgana Curio, connected with  Beth Espinoza  ([id number], [id number]) on 09/28/22 at 11:45 AM EDT by a video-enabled telemedicine application and verified that I am speaking with the correct person using two identifiers.  Location: Patient: Virtual Visit Location Patient: Home Provider: Virtual  Visit Location Provider: Home Office   I discussed the limitations of evaluation and management by telemedicine and the availability of in person appointments. The patient expressed understanding and agreed to proceed.    History of Present Illness: Beth Espinoza is a 87 y.o. who identifies as a female who was assigned female at birth, and is being seen today for positive covid testing this am. She has cough, head congestion, headache. She lives with her daughter who also has covid. 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   Headache 02/01/2020   Chest pain 01/28/2019   Ostealgia 01/16/2019   Generalized pain 01/16/2019   Senile osteoporosis 01/14/2019   Memory change 10/16/2018   Osteoporosis 10/11/2018   Fall 08/19/2017   Ascending aortic aneurysm (HCC) 01/06/2017   Hx of migraines 12/12/2016   Pseudophakia of right eye 10/07/2016   Carotid artery disease (HCC) 07/24/2015   Ectatic thoracic aorta (HCC) 07/24/2015   TIA (transient ischemic attack) 07/20/2015   Dysphagia 06/10/2015   Essential hypertension 02/11/2015   Health care maintenance 04/09/2014   Difficulty sleeping 01/25/2014   Left carotid bruit 01/25/2014   Polymyalgia rheumatica (HCC) 07/12/2013   Epiretinal membrane (ERM) of left eye 11/29/2012   Nuclear sclerotic cataract of both eyes 11/29/2012   ERM  OD (epiretinal membrane, right eye) 11/04/2012   Hypercholesterolemia 06/25/2012   History of colonic polyps 06/25/2012   Anxiety 06/25/2012   Nephrolithiasis 06/25/2012    Allergies:  Allergies  Allergen Reactions   Pneumovax [Pneumococcal Polysaccharide Vaccine] Swelling   Pneumococcal Vaccine Swelling   Reclast [Zoledronic Acid]    Medications:  Current Outpatient Medications:     nirmatrelvir/ritonavir, renal dosing, (PAXLOVID) 10 x 150 MG & 10 x 100MG  TABS, Take 2 tablets by mouth 2 (two) times daily for 5 days. (Take nirmatrelvir 150 mg one tablet twice daily for 5 days and ritonavir 100 mg one tablet twice daily for 5 days) Patient GFR is 48, Disp: 20 tablet, Rfl: 0   promethazine-dextromethorphan (PROMETHAZINE-DM) 6.25-15 MG/5ML syrup, Take 5 mLs by mouth 4 (four) times daily as needed for up to 10 days for cough., Disp: 118 mL, Rfl: 0   amLODipine (NORVASC) 5 MG tablet, Take 1 tablet (5 mg total) by mouth daily., Disp: 90 tablet, Rfl: 0   ascorbic acid (VITAMIN C) 1000 MG tablet, Take 1,000 mg by mouth daily., Disp: , Rfl:    atenolol (TENORMIN) 25 MG tablet, Take 1 tablet (25 mg total) by mouth daily., Disp: 90 tablet, Rfl: 0   cholecalciferol (VITAMIN D) 25 MCG tablet, Take 5 tablets (5,000 Units total) by mouth daily., Disp: 30 tablet, Rfl: 1   clopidogrel (PLAVIX) 75 MG tablet, Take 1 tablet (75 mg total) by mouth daily., Disp: 90 tablet, Rfl: 0   Evolocumab (REPATHA SURECLICK) 140 MG/ML SOAJ, Inject 140 mg into the skin every 14 (fourteen) days., Disp: 2 mL, Rfl: 2   folic acid (FOLVITE) 1 MG tablet, Take 2 mg by mouth daily. , Disp: , Rfl:    magnesium oxide (MAG-OX) 400 MG tablet, Take by mouth., Disp: , Rfl:    PARoxetine (PAXIL) 20 MG tablet, Take 1 tablet (20 mg total) by mouth daily., Disp: 90 tablet, Rfl: 1   QUEtiapine (SEROQUEL) 25 MG tablet, Take 1 tablet (25 mg total) by mouth at bedtime., Disp: 30 tablet, Rfl: 1  Observations/Objective: Patient is well-developed, well-nourished in no acute distress.  Resting comfortably  at home.  Head is normocephalic, atraumatic.  No labored breathing.  Speech is clear and coherent with logical content.  Patient is alert and oriented at baseline.    Assessment and Plan: 1. COVID  Cough, weakness,   Follow Up Instructions: I discussed the assessment and treatment plan with the patient. The patient was  provided an opportunity to ask questions and all were answered. The patient agreed with the plan and demonstrated an understanding of the instructions.  A copy of instructions were sent to the patient via MyChart unless otherwise noted below.     The patient was advised to call back or seek an in-person evaluation if the symptoms worsen or if the condition fails to improve as anticipated.  Time:  I spent 10 minutes with the patient via telehealth technology discussing the above problems/concerns.    Georgana Curio, FNP

## 2022-09-28 NOTE — Patient Instructions (Signed)

## 2022-09-28 NOTE — Progress Notes (Signed)
Hi Beth Espinoza. In order to be treated for COVID we will need to have you schedule a virtual visit. We can not treat for COVID with the antivirals through an evisit.

## 2022-10-02 ENCOUNTER — Other Ambulatory Visit: Payer: Self-pay

## 2022-10-13 ENCOUNTER — Other Ambulatory Visit: Payer: Self-pay

## 2022-10-23 ENCOUNTER — Encounter: Payer: Self-pay | Admitting: Internal Medicine

## 2022-10-24 ENCOUNTER — Other Ambulatory Visit: Payer: Self-pay

## 2022-10-24 ENCOUNTER — Ambulatory Visit (INDEPENDENT_AMBULATORY_CARE_PROVIDER_SITE_OTHER): Payer: PPO | Admitting: Nurse Practitioner

## 2022-10-24 VITALS — BP 110/60 | HR 68 | Temp 98.1°F | Ht 65.0 in | Wt 144.4 lb

## 2022-10-24 DIAGNOSIS — H5789 Other specified disorders of eye and adnexa: Secondary | ICD-10-CM

## 2022-10-24 DIAGNOSIS — R509 Fever, unspecified: Secondary | ICD-10-CM

## 2022-10-24 DIAGNOSIS — H04123 Dry eye syndrome of bilateral lacrimal glands: Secondary | ICD-10-CM

## 2022-10-24 LAB — POC URINALSYSI DIPSTICK (AUTOMATED)
Blood, UA: NEGATIVE
Glucose, UA: NEGATIVE
Ketones, UA: NEGATIVE
Nitrite, UA: NEGATIVE
Protein, UA: NEGATIVE
Spec Grav, UA: 1.025 (ref 1.010–1.025)
Urobilinogen, UA: 0.2 E.U./dL
pH, UA: 5.5 (ref 5.0–8.0)

## 2022-10-24 MED ORDER — LORATADINE 10 MG PO TABS
10.0000 mg | ORAL_TABLET | Freq: Every day | ORAL | 0 refills | Status: DC
Start: 2022-10-24 — End: 2023-02-04
  Filled 2022-10-24: qty 100, 100d supply, fill #0

## 2022-10-24 MED ORDER — FLUTICASONE PROPIONATE 50 MCG/ACT NA SUSP
1.0000 | Freq: Every day | NASAL | 0 refills | Status: DC
  Filled 2022-10-24: qty 16, 60d supply, fill #0

## 2022-10-24 NOTE — Patient Instructions (Addendum)
Rx of Flonase nasal spray sent to pharmacy. Use OTC eye lubricant drop twice a day and antihistamine.

## 2022-10-24 NOTE — Progress Notes (Signed)
Established Patient Office Visit  Subjective:  [name], female    DOB: 10-01-1935  Age: 87 y.o. MRN: [id number]  CC:  Chief Complaint  Patient presents with   Nasal Congestion    Pt tested positive for Covid 3 weeks ago. Put has started to feel sick again. Symptoms consist fever, chest congestion, cough and headache.    HPI  Beth Espinoza presents for nasal congestion, fever, sore throat and congestion.  She is accompanied by her son.  The son states that she had fever yesterday and no fever today had tylenol yesterday.  She denise sore throat, cough or headache today.  She does reports burning of her eyes but no visual difficulty.  HPI   Past Medical History:  Diagnosis Date   Anxiety    Arthritis    Cataract    History of chicken pox    History of kidney stones October 2013   Hx: UTI (urinary tract infection)    Hyperlipidemia    Hypertension    Rheumatoid arthritis (HCC)    Stroke (HCC)    tia    Past Surgical History:  Procedure Laterality Date   ABDOMINAL HYSTERECTOMY     BREAST BIOPSY Right 1995   CATARACT EXTRACTION W/PHACO Left 04/13/2018   Procedure: CATARACT EXTRACTION PHACO AND INTRAOCULAR LENS PLACEMENT (IOC) Left Eye;  Surgeon: Galen Manila, MD;  Location: [facility] ORS;  Service: Ophthalmology;  Laterality: Left;  [place]  00:47.0 CDE 5.52 Fluid pack lot # [id number]   EYE SURGERY     IR ANGIO INTRA EXTRACRAN SEL COM CAROTID INNOMINATE BILAT MOD SED  07/18/2016   IR ANGIO VERTEBRAL SEL VERTEBRAL BILAT MOD SED  07/18/2016   PARTIAL HYSTERECTOMY  1972   TONSILLECTOMY AND ADENOIDECTOMY  1945    Family History  Problem Relation Age of Onset   Asthma Mother    Diabetes Mother    Cataracts Mother    Heart disease Father    Diabetes Father    Hypertension Father    Cancer Paternal Aunt        Liver   Diabetes Paternal Grandmother    Diabetes Sister    Retinal degeneration Sister    Glaucoma Sister     Social History   Socioeconomic History    Marital status: Married    Spouse name: Not on file   Number of children: Not on file   Years of education: Not on file   Highest education level: Bachelor's degree (e.g., BA, AB, BS)  Occupational History   Not on file  Tobacco Use   Smoking status: Never   Smokeless tobacco: Never  Vaping Use   Vaping status: Unknown  Substance and Sexual Activity   Alcohol use: No    Alcohol/week: 0.0 standard drinks of alcohol   Drug use: No   Sexual activity: Not on file  Other Topics Concern   Not on file  Social History Narrative   ** Merged History Encounter **       Social Determinants of Health   Financial Resource Strain: Low Risk  (07/10/2022)   Overall Financial Resource Strain (CARDIA)    Difficulty of Paying Living Expenses: Not hard at all  Food Insecurity: No Food Insecurity (07/10/2022)   Hunger Vital Sign    Worried About Running Out of Food in the Last Year: Never true    Ran Out of Food in the Last Year: Never true  Transportation Needs: No Transportation Needs (07/10/2022)  PRAPARE - Administrator, Civil Service (Medical): No    Lack of Transportation (Non-Medical): No  Physical Activity: Insufficiently Active (07/10/2022)   Exercise Vital Sign    Days of Exercise per Week: 2 days    Minutes of Exercise per Session: 10 min  Stress: No Stress Concern Present (07/10/2022)   [employer] of Occupational Health - Occupational Stress Questionnaire    Feeling of Stress : Only a little  Social Connections: Moderately Isolated (07/10/2022)   Social Connection and Isolation Panel [NHANES]    Frequency of Communication with Friends and Family: Once a week    Frequency of Social Gatherings with Friends and Family: Once a week    Attends Religious Services: 1 to 4 times per year    Active Member of [employer] or Organizations: No    Attends Engineer, structural: Not on file    Marital Status: Married  Catering manager Violence: Not At Risk (05/05/2022)    Humiliation, Afraid, Rape, and Kick questionnaire    Fear of Current or Ex-Partner: No    Emotionally Abused: No    Physically Abused: No    Sexually Abused: No     Outpatient Medications Prior to Visit  Medication Sig Dispense Refill   amLODipine (NORVASC) 5 MG tablet Take 1 tablet (5 mg total) by mouth daily. 90 tablet 0   ascorbic acid (VITAMIN C) 1000 MG tablet Take 1,000 mg by mouth daily.     atenolol (TENORMIN) 25 MG tablet Take 1 tablet (25 mg total) by mouth daily. 90 tablet 0   cholecalciferol (VITAMIN D) 25 MCG tablet Take 5 tablets (5,000 Units total) by mouth daily. 30 tablet 1   clopidogrel (PLAVIX) 75 MG tablet Take 1 tablet (75 mg total) by mouth daily. 90 tablet 0   Evolocumab (REPATHA SURECLICK) 140 MG/ML SOAJ Inject 140 mg into the skin every 14 (fourteen) days. 2 mL 2   folic acid (FOLVITE) 1 MG tablet Take 2 mg by mouth daily.      magnesium oxide (MAG-OX) 400 MG tablet Take by mouth.     PARoxetine (PAXIL) 20 MG tablet Take 1 tablet (20 mg total) by mouth daily. 90 tablet 1   QUEtiapine (SEROQUEL) 25 MG tablet Take 1 tablet (25 mg total) by mouth at bedtime. 30 tablet 1   No facility-administered medications prior to visit.    Allergies  Allergen Reactions   Pneumovax [Pneumococcal Polysaccharide Vaccine] Swelling   Pneumococcal Vaccine Swelling   Reclast [Zoledronic Acid]     ROS Review of Systems Negative unless indicated in HPI.    Objective:    Physical Exam Constitutional:      Appearance: Normal appearance.  HENT:     Right Ear: Tympanic membrane normal. Tympanic membrane is not erythematous.     Left Ear: Tympanic membrane normal. Tympanic membrane is not erythematous.     Nose:     Right Turbinates: Not enlarged.     Left Turbinates: Not enlarged.     Right Sinus: No maxillary sinus tenderness or frontal sinus tenderness.     Left Sinus: No maxillary sinus tenderness or frontal sinus tenderness.     Mouth/Throat:     Mouth: Mucous  membranes are moist.     Pharynx: No pharyngeal swelling, oropharyngeal exudate or posterior oropharyngeal erythema.     Tonsils: No tonsillar exudate.  Eyes:     Conjunctiva/sclera: Conjunctivae normal.     Pupils: Pupils are equal, round, and reactive to  light.  Cardiovascular:     Rate and Rhythm: Normal rate and regular rhythm.  Pulmonary:     Effort: Pulmonary effort is normal.     Breath sounds: Normal breath sounds. No stridor. No wheezing.  Neurological:     General: No focal deficit present.     Mental Status: She is alert and oriented to person, place, and time. Mental status is at baseline.  Psychiatric:        Mood and Affect: Mood normal.        Behavior: Behavior normal.        Thought Content: Thought content normal.        Judgment: Judgment normal.     BP 110/60   Pulse 68   Temp 98.1 F (36.7 C) (Oral)   Ht 5\' 5"  (1.651 m)   Wt 144 lb 6.4 oz (65.5 kg)   SpO2 98%   BMI 24.03 kg/m  Wt Readings from Last 3 Encounters:  10/24/22 144 lb 6.4 oz (65.5 kg)  07/10/22 147 lb 3.2 oz (66.8 kg)  05/05/22 146 lb (66.2 kg)     Health Maintenance  Topic Date Due   Zoster Vaccines- Shingrix (1 of 2) Never done   INFLUENZA VACCINE  10/02/2022   Pneumonia Vaccine 37+ Years old (1 of 1 - PCV) 07/10/2023 (Originally 08/16/2002)   MAMMOGRAM  02/05/2025 (Originally 03/09/2014)   Medicare Annual Wellness (AWV)  05/05/2023   DTaP/Tdap/Td (3 - Td or Tdap) 04/14/2028   DEXA SCAN  Completed   HPV VACCINES  Aged Out   COVID-19 Vaccine  Discontinued    There are no preventive care reminders to display for this patient.  Lab Results  Component Value Date   TSH 1.64 07/16/2022   Lab Results  Component Value Date   WBC 10.8 (H) 07/16/2022   HGB 13.3 07/16/2022   HCT 39.5 07/16/2022   MCV 87.9 07/16/2022   PLT 336.0 07/16/2022   Lab Results  Component Value Date   NA 139 07/16/2022   K 3.9 07/16/2022   CO2 25 07/16/2022   GLUCOSE 112 (H) 07/16/2022   BUN 20  07/16/2022   CREATININE 1.05 07/16/2022   BILITOT 0.5 07/16/2022   ALKPHOS 39 07/16/2022   AST 20 07/16/2022   ALT 14 07/16/2022   PROT 7.1 07/16/2022   ALBUMIN 4.1 07/16/2022   CALCIUM 9.5 07/16/2022   ANIONGAP 6 01/16/2019   GFR 48.65 (L) 07/16/2022   Lab Results  Component Value Date   CHOL 236 (H) 07/16/2022   Lab Results  Component Value Date   HDL 37.50 (L) 07/16/2022   Lab Results  Component Value Date   LDLCALC 168 (H) 07/16/2022   Lab Results  Component Value Date   TRIG 151.0 (H) 07/16/2022   Lab Results  Component Value Date   CHOLHDL 6 07/16/2022   Lab Results  Component Value Date   HGBA1C 6.2 07/16/2022      Assessment & Plan:  Irritation of both eyes Assessment & Plan: Denies visual changes, discharge or erythema. Advised patient to use lubricant drops and antihistamine. Patient will keep [place] posted if symptoms not improving.   Fever, unspecified fever cause Assessment & Plan: Vital signs normal, no adventitious sound during auscultation. Advised symptomatic treatment. Advised to increase fluid intake and rest   Other orders -     Fluticasone Propionate; Place 1 spray into both nostrils daily.  Dispense: 16 g; Refill: 0 -     Loratadine; Take 1 tablet (  10 mg total) by mouth daily.  Dispense: 100 tablet; Refill: 0    Follow-up: Return if symptoms worsen or fail to improve.   Kara Dies, NP

## 2022-10-24 NOTE — Telephone Encounter (Signed)
Scheduled with Evelene Croon for 3 today daughter to call back and confirm appt.

## 2022-10-26 DIAGNOSIS — R0902 Hypoxemia: Secondary | ICD-10-CM | POA: Diagnosis not present

## 2022-11-02 DIAGNOSIS — H04123 Dry eye syndrome of bilateral lacrimal glands: Secondary | ICD-10-CM | POA: Insufficient documentation

## 2022-11-03 DIAGNOSIS — H5789 Other specified disorders of eye and adnexa: Secondary | ICD-10-CM | POA: Insufficient documentation

## 2022-11-03 DIAGNOSIS — R509 Fever, unspecified: Secondary | ICD-10-CM | POA: Insufficient documentation

## 2022-11-03 NOTE — Assessment & Plan Note (Signed)
Vital signs normal, no adventitious sound during auscultation. Advised symptomatic treatment. Advised to increase fluid intake and rest

## 2022-11-03 NOTE — Assessment & Plan Note (Signed)
Denies visual changes, discharge or erythema. Advised patient to use lubricant drops and antihistamine. Patient will keep [place] posted if symptoms not improving.

## 2022-11-05 ENCOUNTER — Other Ambulatory Visit: Payer: Self-pay | Admitting: Internal Medicine

## 2022-11-05 ENCOUNTER — Other Ambulatory Visit: Payer: Self-pay

## 2022-11-05 MED ORDER — REPATHA SURECLICK 140 MG/ML ~~LOC~~ SOAJ
140.0000 mg | SUBCUTANEOUS | 2 refills | Status: DC
Start: 2022-11-05 — End: 2023-04-09
  Filled 2022-11-05: qty 2, 28d supply, fill #0
  Filled 2022-12-01: qty 2, 28d supply, fill #1
  Filled 2023-02-04 – 2023-03-06 (×2): qty 2, 28d supply, fill #2

## 2022-11-05 MED ORDER — QUETIAPINE FUMARATE 25 MG PO TABS
25.0000 mg | ORAL_TABLET | Freq: Every day | ORAL | 1 refills | Status: DC
  Filled 2022-11-05: qty 30, 30d supply, fill #0
  Filled 2022-12-01: qty 30, 30d supply, fill #1

## 2022-11-05 MED ORDER — AMLODIPINE BESYLATE 5 MG PO TABS
5.0000 mg | ORAL_TABLET | Freq: Every day | ORAL | 0 refills | Status: DC
  Filled 2022-11-05: qty 90, 90d supply, fill #0

## 2022-11-05 MED ORDER — ATENOLOL 25 MG PO TABS
25.0000 mg | ORAL_TABLET | Freq: Every day | ORAL | 0 refills | Status: DC
  Filled 2022-11-05: qty 90, 90d supply, fill #0

## 2022-11-06 ENCOUNTER — Other Ambulatory Visit: Payer: Self-pay

## 2022-11-11 ENCOUNTER — Other Ambulatory Visit: Payer: Self-pay | Admitting: Internal Medicine

## 2022-11-13 ENCOUNTER — Other Ambulatory Visit: Payer: Self-pay

## 2022-11-13 MED ORDER — CLOPIDOGREL BISULFATE 75 MG PO TABS
75.0000 mg | ORAL_TABLET | Freq: Every day | ORAL | 0 refills | Status: DC
  Filled 2022-11-13 (×2): qty 90, 90d supply, fill #0

## 2022-12-29 ENCOUNTER — Other Ambulatory Visit: Payer: Self-pay | Admitting: Internal Medicine

## 2022-12-30 ENCOUNTER — Other Ambulatory Visit: Payer: Self-pay

## 2022-12-30 MED ORDER — QUETIAPINE FUMARATE 25 MG PO TABS
25.0000 mg | ORAL_TABLET | Freq: Every day | ORAL | 1 refills | Status: DC
  Filled 2022-12-30: qty 30, 30d supply, fill #0
  Filled 2023-02-04: qty 30, 30d supply, fill #1

## 2022-12-30 NOTE — Telephone Encounter (Signed)
I refilled the seroquel, but she needs a f/u appt scheduled.  Please schedule f/u appt.

## 2022-12-31 ENCOUNTER — Other Ambulatory Visit: Payer: Self-pay

## 2022-12-31 MED ORDER — FLUAD 0.5 ML IM SUSY
PREFILLED_SYRINGE | INTRAMUSCULAR | 0 refills | Status: DC
  Filled 2022-12-31: qty 0.5, 1d supply, fill #0

## 2023-01-06 ENCOUNTER — Other Ambulatory Visit: Payer: Self-pay

## 2023-01-06 MED ORDER — HYDROCODONE-ACETAMINOPHEN 5-325 MG PO TABS
1.0000 | ORAL_TABLET | ORAL | 0 refills | Status: DC
  Filled 2023-01-06: qty 6, 1d supply, fill #0

## 2023-01-13 ENCOUNTER — Other Ambulatory Visit: Payer: Self-pay

## 2023-01-13 DIAGNOSIS — R4189 Other symptoms and signs involving cognitive functions and awareness: Secondary | ICD-10-CM | POA: Diagnosis not present

## 2023-01-13 DIAGNOSIS — G309 Alzheimer's disease, unspecified: Secondary | ICD-10-CM | POA: Diagnosis not present

## 2023-01-13 DIAGNOSIS — F0150 Vascular dementia without behavioral disturbance: Secondary | ICD-10-CM | POA: Diagnosis not present

## 2023-01-13 DIAGNOSIS — H9193 Unspecified hearing loss, bilateral: Secondary | ICD-10-CM | POA: Diagnosis not present

## 2023-01-13 DIAGNOSIS — Z8673 Personal history of transient ischemic attack (TIA), and cerebral infarction without residual deficits: Secondary | ICD-10-CM | POA: Diagnosis not present

## 2023-01-13 DIAGNOSIS — F0280 Dementia in other diseases classified elsewhere without behavioral disturbance: Secondary | ICD-10-CM | POA: Diagnosis not present

## 2023-01-13 MED ORDER — MEMANTINE HCL 5 MG PO TABS
5.0000 mg | ORAL_TABLET | Freq: Every day | ORAL | 0 refills | Status: DC
  Filled 2023-01-13: qty 30, 30d supply, fill #0

## 2023-01-15 ENCOUNTER — Other Ambulatory Visit: Payer: Self-pay

## 2023-01-15 MED ORDER — QUETIAPINE FUMARATE 50 MG PO TABS
50.0000 mg | ORAL_TABLET | Freq: Every day | ORAL | 0 refills | Status: DC
  Filled 2023-01-15: qty 90, 90d supply, fill #0

## 2023-01-21 ENCOUNTER — Ambulatory Visit: Payer: PPO | Admitting: Internal Medicine

## 2023-01-21 ENCOUNTER — Encounter: Payer: Self-pay | Admitting: Internal Medicine

## 2023-01-21 ENCOUNTER — Other Ambulatory Visit: Payer: Self-pay | Admitting: Internal Medicine

## 2023-01-21 ENCOUNTER — Other Ambulatory Visit: Payer: Self-pay

## 2023-01-21 VITALS — BP 116/70 | HR 70 | Temp 98.0°F | Resp 16 | Ht 65.0 in | Wt 147.8 lb

## 2023-01-21 DIAGNOSIS — I10 Essential (primary) hypertension: Secondary | ICD-10-CM

## 2023-01-21 DIAGNOSIS — F0280 Dementia in other diseases classified elsewhere without behavioral disturbance: Secondary | ICD-10-CM

## 2023-01-21 DIAGNOSIS — R059 Cough, unspecified: Secondary | ICD-10-CM

## 2023-01-21 DIAGNOSIS — I7121 Aneurysm of the ascending aorta, without rupture: Secondary | ICD-10-CM | POA: Diagnosis not present

## 2023-01-21 DIAGNOSIS — R739 Hyperglycemia, unspecified: Secondary | ICD-10-CM

## 2023-01-21 DIAGNOSIS — F0150 Vascular dementia without behavioral disturbance: Secondary | ICD-10-CM | POA: Diagnosis not present

## 2023-01-21 DIAGNOSIS — F419 Anxiety disorder, unspecified: Secondary | ICD-10-CM

## 2023-01-21 DIAGNOSIS — G309 Alzheimer's disease, unspecified: Secondary | ICD-10-CM | POA: Diagnosis not present

## 2023-01-21 DIAGNOSIS — I779 Disorder of arteries and arterioles, unspecified: Secondary | ICD-10-CM | POA: Diagnosis not present

## 2023-01-21 DIAGNOSIS — M810 Age-related osteoporosis without current pathological fracture: Secondary | ICD-10-CM | POA: Diagnosis not present

## 2023-01-21 DIAGNOSIS — R053 Chronic cough: Secondary | ICD-10-CM | POA: Insufficient documentation

## 2023-01-21 DIAGNOSIS — G479 Sleep disorder, unspecified: Secondary | ICD-10-CM

## 2023-01-21 DIAGNOSIS — D72829 Elevated white blood cell count, unspecified: Secondary | ICD-10-CM

## 2023-01-21 DIAGNOSIS — E7800 Pure hypercholesterolemia, unspecified: Secondary | ICD-10-CM

## 2023-01-21 LAB — HEPATIC FUNCTION PANEL
ALT: 18 U/L (ref 0–35)
AST: 23 U/L (ref 0–37)
Albumin: 4.2 g/dL (ref 3.5–5.2)
Alkaline Phosphatase: 45 U/L (ref 39–117)
Bilirubin, Direct: 0 mg/dL (ref 0.0–0.3)
Total Bilirubin: 0.4 mg/dL (ref 0.2–1.2)
Total Protein: 7.2 g/dL (ref 6.0–8.3)

## 2023-01-21 LAB — BASIC METABOLIC PANEL
BUN: 22 mg/dL (ref 6–23)
CO2: 31 meq/L (ref 19–32)
Calcium: 10.6 mg/dL — ABNORMAL HIGH (ref 8.4–10.5)
Chloride: 102 meq/L (ref 96–112)
Creatinine, Ser: 1.23 mg/dL — ABNORMAL HIGH (ref 0.40–1.20)
GFR: 40.09 mL/min — ABNORMAL LOW (ref >60.00)
Glucose, Bld: 96 mg/dL (ref 70–99)
Potassium: 4.6 meq/L (ref 3.5–5.1)
Sodium: 140 meq/L (ref 135–145)

## 2023-01-21 LAB — HEMOGLOBIN A1C: Hgb A1c MFr Bld: 6.2 % (ref 4.6–6.5)

## 2023-01-21 LAB — CBC WITH DIFFERENTIAL/PLATELET
Basophils Absolute: 0.1 10*3/uL (ref 0.0–0.1)
Basophils Relative: 1.1 % (ref 0.0–3.0)
Eosinophils Absolute: 0.3 10*3/uL (ref 0.0–0.7)
Eosinophils Relative: 2.4 % (ref 0.0–5.0)
HCT: 40.6 % (ref 36.0–46.0)
Hemoglobin: 13.3 g/dL (ref 12.0–15.0)
Lymphocytes Relative: 26.7 % (ref 12.0–46.0)
Lymphs Abs: 3.4 10*3/uL (ref 0.7–4.0)
MCHC: 32.8 g/dL (ref 30.0–36.0)
MCV: 91.3 fL (ref 78.0–100.0)
Monocytes Absolute: 0.9 10*3/uL (ref 0.1–1.0)
Monocytes Relative: 7.1 % (ref 3.0–12.0)
Neutro Abs: 7.9 10*3/uL — ABNORMAL HIGH (ref 1.4–7.7)
Neutrophils Relative %: 62.7 % (ref 43.0–77.0)
Platelets: 363 10*3/uL (ref 150.0–400.0)
RBC: 4.45 Mil/uL (ref 3.87–5.11)
RDW: 14.5 % (ref 11.5–15.5)
WBC: 12.6 10*3/uL — ABNORMAL HIGH (ref 4.0–10.5)

## 2023-01-21 LAB — LIPID PANEL
Cholesterol: 195 mg/dL (ref 0–200)
HDL: 37.5 mg/dL — ABNORMAL LOW (ref >39.00)
LDL Cholesterol: 101 mg/dL — ABNORMAL HIGH (ref 0–99)
NonHDL: 157.04
Total CHOL/HDL Ratio: 5
Triglycerides: 282 mg/dL — ABNORMAL HIGH (ref 0.0–149.0)
VLDL: 56.4 mg/dL — ABNORMAL HIGH (ref 0.0–40.0)

## 2023-01-21 NOTE — Assessment & Plan Note (Signed)
Low carb diet and exercise.  Follow met b and a1c.   

## 2023-01-21 NOTE — Assessment & Plan Note (Signed)
Continue amlodipine and atenolol.   Follow blood pressure.  Follow metabolic panel.  

## 2023-01-21 NOTE — Assessment & Plan Note (Signed)
Continues on paxil.  Has tapered xanax.  Not taking now.  Takin seroquel q hs.  Appears to be doing well on current dose. Follow

## 2023-01-21 NOTE — Progress Notes (Signed)
Subjective:    [name], female    DOB: 1935-11-10, 87 y.o.   MRN: [id number]  Patient here for  Chief Complaint  Patient presents with   Medical Management of Chronic Issues    HPI Here for workin appt.  Workin  to discuss medications. She is accompanied by her daughter.  History obtained from both of them.  Just saw neurology 01/13/23 - evaluation cognitive impairment. Recommended starting namenda.  Discussed.  Agree with starting.  Does report some persistent intermittent cough.  Denies any significant increased chest congestion.  Daughter states that when has coughing spells - sounds more congested.  Discussed allergies. Discussed possible acid reflux. Ate bar-b-que recently - increased acid reflux.  No vomiting. Eating well.  No abdominal pain.  Bowels moving.  Daughter reports she is sleeping well.  Taking seroquel.  Feels the 25mg  dose is working well.  Hold on increasing dose to 50mg . Increased stress - home stress. Discussed.  Daughter planning to move here.    Past Medical History:  Diagnosis Date   Anxiety    Arthritis    Cataract    History of chicken pox    History of kidney stones October 2013   Hx: UTI (urinary tract infection)    Hyperlipidemia    Hypertension    Rheumatoid arthritis (HCC)    Stroke (HCC)    tia   Past Surgical History:  Procedure Laterality Date   ABDOMINAL HYSTERECTOMY     BREAST BIOPSY Right 1995   CATARACT EXTRACTION W/PHACO Left 04/13/2018   Procedure: CATARACT EXTRACTION PHACO AND INTRAOCULAR LENS PLACEMENT (IOC) Left Eye;  Surgeon: Galen Manila, MD;  Location: [facility] ORS;  Service: Ophthalmology;  Laterality: Left;  [place]  00:47.0 CDE 5.52 Fluid pack lot # [id number]   EYE SURGERY     IR ANGIO INTRA EXTRACRAN SEL COM CAROTID INNOMINATE BILAT MOD SED  07/18/2016   IR ANGIO VERTEBRAL SEL VERTEBRAL BILAT MOD SED  07/18/2016   PARTIAL HYSTERECTOMY  1972   TONSILLECTOMY AND ADENOIDECTOMY  1945   Family History  Problem Relation  Age of Onset   Asthma Mother    Diabetes Mother    Cataracts Mother    Heart disease Father    Diabetes Father    Hypertension Father    Cancer Paternal Aunt        Liver   Diabetes Paternal Grandmother    Diabetes Sister    Retinal degeneration Sister    Glaucoma Sister    Social History   Socioeconomic History   Marital status: Married    Spouse name: Not on file   Number of children: Not on file   Years of education: Not on file   Highest education level: Bachelor's degree (e.g., BA, AB, BS)  Occupational History   Not on file  Tobacco Use   Smoking status: Never   Smokeless tobacco: Never  Vaping Use   Vaping status: Unknown  Substance and Sexual Activity   Alcohol use: No    Alcohol/week: 0.0 standard drinks of alcohol   Drug use: No   Sexual activity: Not on file  Other Topics Concern   Not on file  Social History Narrative   ** Merged History Encounter **       Social Determinants of Health   Financial Resource Strain: Low Risk  (01/20/2023)   Overall Financial Resource Strain (CARDIA)    Difficulty of Paying Living Expenses: Not hard at all  Food Insecurity: No  Food Insecurity (01/20/2023)   Hunger Vital Sign    Worried About Running Out of Food in the Last Year: Never true    Ran Out of Food in the Last Year: Never true  Transportation Needs: No Transportation Needs (01/20/2023)   PRAPARE - Administrator, Civil Service (Medical): No    Lack of Transportation (Non-Medical): No  Physical Activity: Insufficiently Active (01/20/2023)   Exercise Vital Sign    Days of Exercise per Week: 1 day    Minutes of Exercise per Session: 10 min  Stress: No Stress Concern Present (01/20/2023)   [employer] of Occupational Health - Occupational Stress Questionnaire    Feeling of Stress : Only a little  Social Connections: Moderately Isolated (01/20/2023)   Social Connection and Isolation Panel [NHANES]    Frequency of Communication with Friends  and Family: Twice a week    Frequency of Social Gatherings with Friends and Family: Once a week    Attends Religious Services: Never    Database administrator or Organizations: No    Attends Engineer, structural: Not on file    Marital Status: Married     Review of Systems  Constitutional:  Negative for appetite change and unexpected weight change.  HENT:  Negative for congestion and sinus pressure.   Respiratory:  Positive for cough. Negative for chest tightness and shortness of breath.   Cardiovascular:  Negative for chest pain and palpitations.  Gastrointestinal:  Negative for abdominal pain, diarrhea, nausea and vomiting.  Genitourinary:  Negative for difficulty urinating and dysuria.  Musculoskeletal:  Negative for joint swelling and myalgias.  Skin:  Negative for color change and rash.  Neurological:  Negative for dizziness and headaches.  Psychiatric/Behavioral:  Negative for agitation and dysphoric mood.        Objective:     BP 116/70   Pulse 70   Temp 98 F (36.7 C)   Resp 16   Ht 5\' 5"  (1.651 m)   Wt 147 lb 12.8 oz (67 kg)   SpO2 98%   BMI 24.60 kg/m  Wt Readings from Last 3 Encounters:  01/21/23 147 lb 12.8 oz (67 kg)  10/24/22 144 lb 6.4 oz (65.5 kg)  07/10/22 147 lb 3.2 oz (66.8 kg)    Physical Exam Vitals reviewed.  Constitutional:      General: She is not in acute distress.    Appearance: Normal appearance.  HENT:     Head: Normocephalic and atraumatic.     Right Ear: External ear normal.     Left Ear: External ear normal.  Eyes:     General: No scleral icterus.       Right eye: No discharge.        Left eye: No discharge.     Conjunctiva/sclera: Conjunctivae normal.  Neck:     Thyroid: No thyromegaly.  Cardiovascular:     Rate and Rhythm: Normal rate and regular rhythm.  Pulmonary:     Effort: No respiratory distress.     Breath sounds: Normal breath sounds. No wheezing.  Abdominal:     General: Bowel sounds are normal.      Palpations: Abdomen is soft.     Tenderness: There is no abdominal tenderness.  Musculoskeletal:        General: No swelling or tenderness.     Cervical back: Neck supple. No tenderness.  Lymphadenopathy:     Cervical: No cervical adenopathy.  Skin:    Findings: No erythema  or rash.  Neurological:     Mental Status: She is alert.  Psychiatric:        Mood and Affect: Mood normal.        Behavior: Behavior normal.      Outpatient Encounter Medications as of 01/21/2023  Medication Sig   amLODipine (NORVASC) 5 MG tablet Take 1 tablet (5 mg total) by mouth daily.   ascorbic acid (VITAMIN C) 1000 MG tablet Take 1,000 mg by mouth daily.   atenolol (TENORMIN) 25 MG tablet Take 1 tablet (25 mg total) by mouth daily.   cholecalciferol (VITAMIN D) 25 MCG tablet Take 5 tablets (5,000 Units total) by mouth daily.   clopidogrel (PLAVIX) 75 MG tablet Take 1 tablet (75 mg total) by mouth daily.   Evolocumab (REPATHA SURECLICK) 140 MG/ML SOAJ Inject 140 mg into the skin every 14 (fourteen) days.   fluticasone (FLONASE) 50 MCG/ACT nasal spray Place 1 spray into both nostrils daily.   folic acid (FOLVITE) 1 MG tablet Take 2 mg by mouth daily.    HYDROcodone-acetaminophen (NORCO/VICODIN) 5-325 MG tablet Take 1 tablet by mouth every 4-6 hours as needed for pain   influenza vaccine adjuvanted (FLUAD) 0.5 ML injection Inject into the muscle.   loratadine (CLARITIN) 10 MG tablet Take 1 tablet (10 mg total) by mouth daily.   magnesium oxide (MAG-OX) 400 MG tablet Take by mouth.   memantine (NAMENDA) 5 MG tablet Take 1 tablet (5 mg total) by mouth at bedtime.   PARoxetine (PAXIL) 20 MG tablet Take 1 tablet (20 mg total) by mouth daily.   QUEtiapine (SEROQUEL) 25 MG tablet Take 1 tablet (25 mg total) by mouth at bedtime.   [DISCONTINUED] QUEtiapine (SEROQUEL) 50 MG tablet Take 1 tablet (50 mg total) by mouth at bedtime.   No facility-administered encounter medications on file as of 01/21/2023.     Lab  Results  Component Value Date   WBC 12.6 (H) 01/21/2023   HGB 13.3 01/21/2023   HCT 40.6 01/21/2023   PLT 363.0 01/21/2023   GLUCOSE 96 01/21/2023   CHOL 195 01/21/2023   TRIG 282.0 (H) 01/21/2023   HDL 37.50 (L) 01/21/2023   LDLDIRECT 181.0 05/28/2021   LDLCALC 101 (H) 01/21/2023   ALT 18 01/21/2023   AST 23 01/21/2023   NA 140 01/21/2023   K 4.6 01/21/2023   CL 102 01/21/2023   CREATININE 1.23 (H) 01/21/2023   BUN 22 01/21/2023   CO2 31 01/21/2023   TSH 1.64 07/16/2022   INR 1.07 07/18/2016   HGBA1C 6.2 01/21/2023       Assessment & Plan:  Essential hypertension Assessment & Plan: Continue amlodipine and atenolol.   Follow blood pressure.  Follow metabolic panel.   Orders: -     Basic metabolic panel  Hypercholesterolemia Assessment & Plan: Intolerance to zetia and crestor. On repatha and tolerating.  Low cholesterol diet and exercise.    Orders: -     CBC with Differential/Platelet -     Hepatic function panel -     Lipid panel  Hyperglycemia Assessment & Plan: Low carb diet and exercise.  Follow met b and a1c.   Orders: -     Hemoglobin A1c  Osteoporosis, unspecified osteoporosis type, unspecified pathological fracture presence Assessment & Plan: Had reaction to reclast.  Calcium, vitamin D and weight bearing exercise. Needs f/u bone density and consider prolia.     Mixed Alzheimer's and vascular dementia [facility]) Assessment & Plan: Seeing neurology.  Recommended namenda.  Discussed.  Agree with starting.  Continue seroquel.  Follow.    Difficulty sleeping Assessment & Plan: Sleeping well with seroquel.  Doing well on 25mg  dose.  Follow. Hold on increasing to 50mg .    Bilateral carotid artery disease, unspecified type [facility]) Assessment & Plan: Evaluated [facility] 04/30/22 - Carotid ultrasound - normal extracranial vessels with no significant stenosis of the bilateral internal carotid arteries. The bilateral vertebral arteries have antegrade flow with  normal flow hemodynamics in the bilateral subclavian arteries.  Recommended f/u prn.    Aneurysm of ascending aorta without rupture [facility]) Assessment & Plan: Saw [facility] 04/30/22 - Aorta - noninvasive studies show no evidence of an abdominal aortic aneurysm. Previous thoracic aortic aneurysm only measured 3.7 cm which is technically ectatic. Recommended f/u prn.    Anxiety Assessment & Plan: Continues on paxil.  Has tapered xanax.  Not taking now.  Takin seroquel q hs.  Appears to be doing well on current dose. Follow    Cough, unspecified type Assessment & Plan: Persistent intermittent cough.  Treat acid reflux.  Pepcid as directed. Also claritin as directed.  Follow.  Call with update. Discussed cxr if persistent.       Dale Powhatan, MD

## 2023-01-21 NOTE — Assessment & Plan Note (Signed)
Persistent intermittent cough.  Treat acid reflux.  Pepcid as directed. Also claritin as directed.  Follow.  Call with update. Discussed cxr if persistent.

## 2023-01-21 NOTE — Assessment & Plan Note (Signed)
Sleeping well with seroquel.  Doing well on 25mg  dose.  Follow. Hold on increasing to 50mg .

## 2023-01-21 NOTE — Assessment & Plan Note (Signed)
Saw [facility] 04/30/22 - Aorta - noninvasive studies show no evidence of an abdominal aortic aneurysm. Previous thoracic aortic aneurysm only measured 3.7 cm which is technically ectatic. Recommended f/u prn.

## 2023-01-21 NOTE — Patient Instructions (Signed)
Claritin 10mg  - one per day as needed  Pepcid 20mg  - take one tablet 30 minutes before breakfast

## 2023-01-21 NOTE — Assessment & Plan Note (Signed)
Had reaction to reclast.  Calcium, vitamin D and weight bearing exercise. Needs f/u bone density and consider prolia.

## 2023-01-21 NOTE — Assessment & Plan Note (Signed)
Seeing neurology.  Recommended namenda.  Discussed.  Agree with starting.  Continue seroquel.  Follow.

## 2023-01-21 NOTE — Assessment & Plan Note (Signed)
Intolerance to zetia and crestor. On repatha and tolerating.  Low cholesterol diet and exercise.

## 2023-01-21 NOTE — Progress Notes (Signed)
Order placed for f/u labs.  

## 2023-01-21 NOTE — Assessment & Plan Note (Signed)
Evaluated [facility] 04/30/22 - Carotid ultrasound - normal extracranial vessels with no significant stenosis of the bilateral internal carotid arteries. The bilateral vertebral arteries have antegrade flow with normal flow hemodynamics in the bilateral subclavian arteries.  Recommended f/u prn.

## 2023-01-22 ENCOUNTER — Telehealth: Payer: Self-pay

## 2023-01-22 NOTE — Telephone Encounter (Signed)
Left message to call the office back regarding her Mother's lab results.

## 2023-01-22 NOTE — Telephone Encounter (Signed)
-----   Message from Ferron sent at 01/21/2023  9:41 PM EST ----- Notify pts daughter - overall sugar control stable. Total and bad cholesterol - improved.  Triglycerides increased.  Continue repatha.  Low carb diet.  Kidney function has decreased.  Calcium slightly elevated.  Continue to stay hydrated.  Avoid antiinflammatory medication. Will need to have f/u labs soon to f/u on these abnormal labs.  Have her return for non fasting lab app in the next 7-10 days.  I will order labs. White blood cell count - slightly elevated.  Hgb and liver function tests are wnl.

## 2023-02-04 ENCOUNTER — Other Ambulatory Visit: Payer: Self-pay | Admitting: Internal Medicine

## 2023-02-04 ENCOUNTER — Other Ambulatory Visit: Payer: Self-pay | Admitting: Nurse Practitioner

## 2023-02-05 ENCOUNTER — Other Ambulatory Visit: Payer: Self-pay

## 2023-02-05 MED ORDER — AMLODIPINE BESYLATE 5 MG PO TABS
5.0000 mg | ORAL_TABLET | Freq: Every day | ORAL | 0 refills | Status: DC
  Filled 2023-02-05: qty 90, 90d supply, fill #0

## 2023-02-05 MED ORDER — FLUTICASONE PROPIONATE 50 MCG/ACT NA SUSP
1.0000 | Freq: Every day | NASAL | 0 refills | Status: DC
  Filled 2023-02-05: qty 16, 60d supply, fill #0

## 2023-02-05 MED ORDER — LORATADINE 10 MG PO TABS
10.0000 mg | ORAL_TABLET | Freq: Every day | ORAL | 2 refills | Status: DC
  Filled 2023-02-05: qty 100, 100d supply, fill #0
  Filled 2023-05-05: qty 100, 100d supply, fill #1

## 2023-02-05 MED ORDER — ATENOLOL 25 MG PO TABS
25.0000 mg | ORAL_TABLET | Freq: Every day | ORAL | 0 refills | Status: DC
  Filled 2023-02-05: qty 90, 90d supply, fill #0

## 2023-02-15 ENCOUNTER — Other Ambulatory Visit: Payer: Self-pay | Admitting: Internal Medicine

## 2023-02-15 ENCOUNTER — Other Ambulatory Visit: Payer: Self-pay

## 2023-02-16 ENCOUNTER — Other Ambulatory Visit: Payer: Self-pay

## 2023-02-16 MED ORDER — MEMANTINE HCL 5 MG PO TABS
5.0000 mg | ORAL_TABLET | Freq: Every day | ORAL | 0 refills | Status: DC
  Filled 2023-02-16 (×2): qty 30, 30d supply, fill #0

## 2023-02-16 MED FILL — Clopidogrel Bisulfate Tab 75 MG (Base Equiv): ORAL | 90 days supply | Qty: 90 | Fill #0 | Status: AC

## 2023-02-17 ENCOUNTER — Other Ambulatory Visit: Payer: Self-pay

## 2023-02-18 ENCOUNTER — Other Ambulatory Visit: Payer: Self-pay

## 2023-02-19 ENCOUNTER — Other Ambulatory Visit: Payer: Self-pay

## 2023-02-19 ENCOUNTER — Other Ambulatory Visit: Payer: Self-pay | Admitting: Internal Medicine

## 2023-02-20 ENCOUNTER — Other Ambulatory Visit: Payer: Self-pay

## 2023-02-20 ENCOUNTER — Other Ambulatory Visit: Payer: Self-pay | Admitting: Internal Medicine

## 2023-02-20 MED FILL — Amlodipine Besylate Tab 5 MG (Base Equivalent): ORAL | 90 days supply | Qty: 90 | Fill #0 | Status: CN

## 2023-02-26 ENCOUNTER — Telehealth: Payer: Self-pay | Admitting: Pharmacy Technician

## 2023-02-26 ENCOUNTER — Other Ambulatory Visit (HOSPITAL_COMMUNITY): Payer: Self-pay

## 2023-02-26 NOTE — Telephone Encounter (Signed)
Pharmacy Patient Advocate Encounter   Received notification from CoverMyMeds that prior authorization for Repatha SureClick 140MG /ML auto-injectors is required/requested.   Insurance verification completed.   The patient is insured through [facility] ADVANTAGE/RX ADVANCE .   Per test claim: PA required; PA submitted to above mentioned insurance via CoverMyMeds Key/confirmation #/EOC [id number] Status is pending

## 2023-02-27 ENCOUNTER — Other Ambulatory Visit (HOSPITAL_COMMUNITY): Payer: Self-pay

## 2023-02-27 NOTE — Telephone Encounter (Signed)
Pharmacy Patient Advocate Encounter  Received notification from [facility] ADVANTAGE/RX ADVANCE that Prior Authorization for Repatha SureClick 140MG /ML auto-injectors  has been APPROVED from 02/27/23 to 02/27/24. Ran test claim, Copay is $82.10. This test claim was processed through [facility]- copay amounts may vary at other pharmacies due to pharmacy/plan contracts, or as the patient moves through the different stages of their insurance plan.   PA #/Case ID/Reference #: [id number]

## 2023-02-27 NOTE — Telephone Encounter (Signed)
Noted  

## 2023-02-27 NOTE — Telephone Encounter (Signed)
I informed the patient's daughter of the patient's Repatha approval.

## 2023-03-06 ENCOUNTER — Other Ambulatory Visit: Payer: Self-pay | Admitting: Internal Medicine

## 2023-03-08 ENCOUNTER — Other Ambulatory Visit: Payer: Self-pay | Admitting: Internal Medicine

## 2023-03-08 ENCOUNTER — Other Ambulatory Visit: Payer: Self-pay

## 2023-03-09 ENCOUNTER — Other Ambulatory Visit: Payer: Self-pay

## 2023-03-10 ENCOUNTER — Other Ambulatory Visit: Payer: Self-pay

## 2023-03-10 MED FILL — Quetiapine Fumarate Tab 25 MG: ORAL | 30 days supply | Qty: 30 | Fill #0 | Status: AC

## 2023-03-12 ENCOUNTER — Other Ambulatory Visit: Payer: Self-pay

## 2023-03-13 ENCOUNTER — Other Ambulatory Visit: Payer: Self-pay

## 2023-03-13 MED ORDER — MEMANTINE HCL 5 MG PO TABS
5.0000 mg | ORAL_TABLET | Freq: Every day | ORAL | 3 refills | Status: DC
  Filled 2023-03-13: qty 30, 30d supply, fill #0
  Filled 2023-04-09: qty 30, 30d supply, fill #1
  Filled 2023-05-05: qty 30, 30d supply, fill #2
  Filled 2023-06-22: qty 30, 30d supply, fill #3

## 2023-04-07 MED FILL — Quetiapine Fumarate Tab 25 MG: ORAL | 30 days supply | Qty: 30 | Fill #1 | Status: AC

## 2023-04-09 ENCOUNTER — Other Ambulatory Visit: Payer: Self-pay

## 2023-04-09 ENCOUNTER — Other Ambulatory Visit: Payer: Self-pay | Admitting: Internal Medicine

## 2023-04-09 MED ORDER — REPATHA SURECLICK 140 MG/ML ~~LOC~~ SOAJ
140.0000 mg | SUBCUTANEOUS | 2 refills | Status: DC
  Filled 2023-04-09: qty 2, 28d supply, fill #0
  Filled 2023-05-05: qty 2, 28d supply, fill #1

## 2023-04-09 MED ORDER — PAROXETINE HCL 20 MG PO TABS
20.0000 mg | ORAL_TABLET | Freq: Every day | ORAL | 1 refills | Status: DC
  Filled 2023-04-09: qty 90, 90d supply, fill #0
  Filled 2023-07-26: qty 90, 90d supply, fill #1

## 2023-05-05 ENCOUNTER — Other Ambulatory Visit: Payer: Self-pay | Admitting: Internal Medicine

## 2023-05-05 ENCOUNTER — Ambulatory Visit (INDEPENDENT_AMBULATORY_CARE_PROVIDER_SITE_OTHER): Payer: PPO | Admitting: *Deleted

## 2023-05-05 ENCOUNTER — Other Ambulatory Visit: Payer: Self-pay

## 2023-05-05 VITALS — Ht 65.0 in | Wt 150.0 lb

## 2023-05-05 DIAGNOSIS — Z0000 Encounter for general adult medical examination without abnormal findings: Secondary | ICD-10-CM

## 2023-05-05 MED ORDER — QUETIAPINE FUMARATE 25 MG PO TABS
25.0000 mg | ORAL_TABLET | Freq: Every day | ORAL | 1 refills | Status: DC
  Filled 2023-05-05: qty 30, 30d supply, fill #0
  Filled 2023-06-07: qty 30, 30d supply, fill #1

## 2023-05-05 MED ORDER — CLOPIDOGREL BISULFATE 75 MG PO TABS
75.0000 mg | ORAL_TABLET | Freq: Every day | ORAL | 0 refills | Status: DC
  Filled 2023-05-05: qty 90, 90d supply, fill #0

## 2023-05-05 MED ORDER — ATENOLOL 25 MG PO TABS
25.0000 mg | ORAL_TABLET | Freq: Every day | ORAL | 0 refills | Status: DC
  Filled 2023-05-05: qty 90, 90d supply, fill #0

## 2023-05-05 MED ORDER — FLUTICASONE PROPIONATE 50 MCG/ACT NA SUSP
1.0000 | Freq: Every day | NASAL | 0 refills | Status: DC
  Filled 2023-05-05: qty 16, 60d supply, fill #0

## 2023-05-05 MED FILL — Amlodipine Besylate Tab 5 MG (Base Equivalent): ORAL | 90 days supply | Qty: 90 | Fill #0 | Status: AC

## 2023-05-05 NOTE — Patient Instructions (Signed)
 Ms. Beth Espinoza , Thank you for taking time to come for your Medicare Wellness Visit. I appreciate your ongoing commitment to your health goals. Please review the following plan we discussed and let me know if I can assist you in the future.   Referrals/Orders/Follow-Ups/Clinician Recommendations: Remember to call and schedule an eye exam. Consider updating your vaccines.  This is a list of the screening recommended for you and due dates:  Health Maintenance  Topic Date Due   Zoster (Shingles) Vaccine (1 of 2) Never done   Pneumonia Vaccine (1 of 1 - PCV) 07/10/2023*   Mammogram  02/05/2025*   Medicare Annual Wellness Visit  05/04/2024   DTaP/Tdap/Td vaccine (3 - Td or Tdap) 04/14/2028   Flu Shot  Completed   DEXA scan (bone density measurement)  Completed   HPV Vaccine  Aged Out   COVID-19 Vaccine  Discontinued  *Topic was postponed. The date shown is not the original due date.    Advanced directives: (Declined) Advance directive discussed with you today. Even though you declined this today, please call our office should you change your mind, and we can give you the proper paperwork for you to fill out.  Next Medicare Annual Wellness Visit scheduled for next year: Yes 05/09/24 @ 1:00

## 2023-05-05 NOTE — Progress Notes (Signed)
 Subjective:   Beth Espinoza is a 88 y.o. who presents for a Medicare Wellness preventive visit.  Visit Complete: Virtual I connected with  Marshall Cork on 05/05/23 by a audio enabled telemedicine application and verified that I am speaking with the correct person using two identifiers. Called and confirmed medications with daughter Beth Espinoza.  Patient Location: Home  Provider Location: Office/Clinic  I discussed the limitations of evaluation and management by telemedicine. The patient expressed understanding and agreed to proceed.  Vital Signs: Because this visit was a virtual/telehealth visit, some criteria may be missing or patient reported. Any vitals not documented were not able to be obtained and vitals that have been documented are patient reported.  VideoDeclined- This patient declined Librarian, academic. Therefore the visit was completed with audio only.  AWV Questionnaire: No: Patient Medicare AWV questionnaire was not completed prior to this visit.  Cardiac Risk Factors include: advanced age (>40men, >46 women);dyslipidemia;hypertension     Objective:    Today's Vitals   05/05/23 1301  Weight: 150 lb (68 kg)  Height: 5\' 5"  (1.651 m)   Body mass index is 24.96 kg/m.     05/05/2023    1:13 PM 05/05/2022    1:46 PM 09/07/2020    5:25 PM 04/02/2020   11:27 AM 01/16/2019   11:03 AM 01/15/2019   10:11 PM 01/15/2019    8:52 AM  Advanced Directives  Does Patient Have a Medical Advance Directive? No Yes No Yes Yes Yes Yes  Type of Air cabin crew of [place];Living will Healthcare Power of [employer] Power of [employer] Power of Attorney  Does patient want to make changes to medical advance directive?  No - Patient declined  No - Patient declined No - Guardian declined    Copy of Healthcare Power of Attorney in Chart?    No - copy requested No - copy requested    Would patient like  information on creating a medical advance directive? No - Patient declined          Current Medications (verified) Outpatient Encounter Medications as of 05/05/2023  Medication Sig   amLODipine (NORVASC) 5 MG tablet Take 1 tablet (5 mg total) by mouth daily.   atenolol (TENORMIN) 25 MG tablet Take 1 tablet (25 mg total) by mouth daily.   cholecalciferol (VITAMIN D) 25 MCG tablet Take 5 tablets (5,000 Units total) by mouth daily.   clopidogrel (PLAVIX) 75 MG tablet Take 1 tablet (75 mg total) by mouth daily.   Evolocumab (REPATHA SURECLICK) 140 MG/ML SOAJ Inject 140 mg into the skin every 14 (fourteen) days.   fluticasone (FLONASE) 50 MCG/ACT nasal spray Place 1 spray into both nostrils daily.   folic acid (FOLVITE) 1 MG tablet Take 2 mg by mouth daily.    influenza vaccine adjuvanted (FLUAD) 0.5 ML injection Inject into the muscle.   loratadine (CLARITIN) 10 MG tablet Take 1 tablet (10 mg total) by mouth daily.   magnesium oxide (MAG-OX) 400 MG tablet Take by mouth.   memantine (NAMENDA) 5 MG tablet Take 1 tablet (5 mg total) by mouth at bedtime.   PARoxetine (PAXIL) 20 MG tablet Take 1 tablet (20 mg total) by mouth daily.   QUEtiapine (SEROQUEL) 25 MG tablet Take 1 tablet (25 mg total) by mouth at bedtime.   ascorbic acid (VITAMIN C) 1000 MG tablet Take 1,000 mg by mouth daily. (Patient not taking: Reported on 05/05/2023)   [  DISCONTINUED] HYDROcodone-acetaminophen (NORCO/VICODIN) 5-325 MG tablet Take 1 tablet by mouth every 4-6 hours as needed for pain (Patient not taking: Reported on 05/05/2023)   No facility-administered encounter medications on file as of 05/05/2023.    Allergies (verified) Pneumovax [pneumococcal polysaccharide vaccine], Pneumococcal vaccine, and Reclast [zoledronic acid]   History: Past Medical History:  Diagnosis Date   Anxiety    Arthritis    Cataract    History of chicken pox    History of kidney stones October 2013   Hx: UTI (urinary tract infection)     Hyperlipidemia    Hypertension    Rheumatoid arthritis (HCC)    Stroke (HCC)    tia   Past Surgical History:  Procedure Laterality Date   ABDOMINAL HYSTERECTOMY     BREAST BIOPSY Right 1995   CATARACT EXTRACTION W/PHACO Left 04/13/2018   Procedure: CATARACT EXTRACTION PHACO AND INTRAOCULAR LENS PLACEMENT (IOC) Left Eye;  Surgeon: Galen Manila, MD;  Location: [facility] ORS;  Service: Ophthalmology;  Laterality: Left;  [place]  00:47.0 CDE 5.52 Fluid pack lot # [id number]   EYE SURGERY     IR ANGIO INTRA EXTRACRAN SEL COM CAROTID INNOMINATE BILAT MOD SED  07/18/2016   IR ANGIO VERTEBRAL SEL VERTEBRAL BILAT MOD SED  07/18/2016   PARTIAL HYSTERECTOMY  1972   TONSILLECTOMY AND ADENOIDECTOMY  1945   Family History  Problem Relation Age of Onset   Asthma Mother    Diabetes Mother    Cataracts Mother    Heart disease Father    Diabetes Father    Hypertension Father    Cancer Paternal Aunt        Liver   Diabetes Paternal Grandmother    Diabetes Sister    Retinal degeneration Sister    Glaucoma Sister    Social History   Socioeconomic History   Marital status: Married    Spouse name: Not on file   Number of children: Not on file   Years of education: Not on file   Highest education level: Bachelor's degree (e.g., BA, AB, BS)  Occupational History   Not on file  Tobacco Use   Smoking status: Never   Smokeless tobacco: Never  Vaping Use   Vaping status: Unknown  Substance and Sexual Activity   Alcohol use: No    Alcohol/week: 0.0 standard drinks of alcohol   Drug use: No   Sexual activity: Not on file  Other Topics Concern   Not on file  Social History Narrative   ** Merged History Encounter **       Social Drivers of Health   Financial Resource Strain: Low Risk  (05/05/2023)   Overall Financial Resource Strain (CARDIA)    Difficulty of Paying Living Expenses: Not hard at all  Food Insecurity: No Food Insecurity (05/05/2023)   Hunger Vital Sign    Worried About Running Out  of Food in the Last Year: Never true    Ran Out of Food in the Last Year: Never true  Transportation Needs: No Transportation Needs (05/05/2023)   PRAPARE - Administrator, Civil Service (Medical): No    Lack of Transportation (Non-Medical): No  Physical Activity: Inactive (05/05/2023)   Exercise Vital Sign    Days of Exercise per Week: 0 days    Minutes of Exercise per Session: 0 min  Stress: No Stress Concern Present (05/05/2023)   [employer] of Occupational Health - Occupational Stress Questionnaire    Feeling of Stress : Not at all  Social Connections: Moderately Integrated (05/05/2023)   Social Connection and Isolation Panel [NHANES]    Frequency of Communication with Friends and Family: Twice a week    Frequency of Social Gatherings with Friends and Family: More than three times a week    Attends Religious Services: More than 4 times per year    Active Member of [employer] or Organizations: No    Attends Engineer, structural: Never    Marital Status: Married    Tobacco Counseling Counseling given: Not Answered    Clinical Intake:  Pre-visit preparation completed: Yes  Pain : No/denies pain     BMI - recorded: 24.96 Nutritional Status: BMI of 19-24  Normal Nutritional Risks: None Diabetes: No  How often do you need to have someone help you when you read instructions, pamphlets, or other written materials from your doctor or pharmacy?: 1 - Never  Interpreter Needed?: No  Information entered by :: R. Verlinda Slotnick LPN   Activities of Daily Living     05/05/2023    1:02 PM  In your present state of health, do you have any difficulty performing the following activities:  Hearing? 0  Vision? 0  Comment readers  Difficulty concentrating or making decisions? 0  Walking or climbing stairs? 0  Dressing or bathing? 0  Doing errands, shopping? 0  Preparing Food and eating ? N  Using the Toilet? N  In the past six months, have you accidently leaked urine? N   Do you have problems with loss of bowel control? N  Managing your Medications? Y  Managing your Finances? N  Housekeeping or managing your Housekeeping? N    Patient Care Team: Dale , MD as PCP - General (Internal Medicine) Lonell Face, MD as Consulting Physician (Neurology)  Indicate any recent Medical Services you may have received from other than [facility] providers in the past year (date may be approximate).     Assessment:   This is a routine wellness examination for Beth Espinoza.  Hearing/Vision screen Hearing Screening - Comments:: No issues Vision Screening - Comments:: readers   Goals Addressed             This Visit's Progress    Patient Stated       Wants to continue to walk       Depression Screen     05/05/2023    1:08 PM 05/05/2022    1:45 PM 02/06/2022    3:47 PM 09/17/2021   11:29 AM 05/28/2021    1:49 PM 04/02/2020   11:14 AM 02/17/2020    2:44 PM  PHQ 2/9 Scores  PHQ - 2 Score 0 0 2 0 0 0 0  PHQ- 9 Score 0  7        Fall Risk     05/05/2023    1:04 PM 10/24/2022    3:01 PM 05/05/2022    1:47 PM 02/06/2022    3:47 PM 09/17/2021   11:29 AM  Fall Risk   Falls in the past year? 0 0  0 0  Number falls in past yr: 0 0  0   Injury with Fall? 0 0  1   Risk for fall due to : No Fall Risks No Fall Risks  History of fall(s);Impaired balance/gait No Fall Risks  Follow up Falls prevention discussed;Falls evaluation completed Falls evaluation completed Falls evaluation completed;Falls prevention discussed Falls evaluation completed Falls evaluation completed    MEDICARE RISK AT HOME:  Medicare Risk at Home Any stairs  in or around the home?: Yes If so, are there any without handrails?: No Home free of loose throw rugs in walkways, pet beds, electrical cords, etc?: Yes Adequate lighting in your home to reduce risk of falls?: Yes Life alert?: No Use of a cane, walker or w/c?: No Grab bars in the bathroom?: Yes Shower chair or bench in shower?: Yes Elevated  toilet seat or a handicapped toilet?: Yes  TIMED UP AND GO:  Was the test performed?  No  Cognitive Function: 6CIT completed    12/30/2016    1:55 PM  MMSE - Mini Mental State Exam  Orientation to time 5  Orientation to Place 5  Registration 3  Attention/ Calculation 5  Recall 3  Language- name 2 objects 2  Language- repeat 1  Language- follow 3 step command 3  Language- read & follow direction 1  Write a sentence 1  Copy design 1  Total score 30        05/05/2023    1:13 PM 05/05/2022    1:49 PM 01/04/2019   12:03 PM 12/31/2017    1:08 PM  6CIT Screen  What Year? 4 points 0 points 0 points 0 points  What month? 3 points 0 points 0 points 0 points  What time? 0 points 0 points 0 points 0 points  Count back from 20 2 points 0 points 0 points 0 points  Months in reverse 0 points 0 points 0 points 0 points  Repeat phrase 4 points 4 points 0 points 0 points  Total Score 13 points 4 points 0 points 0 points    Immunizations Immunization History  Administered Date(s) Administered   Fluad Trivalent(High Dose 65+) 12/31/2022   Influenza Split 12/20/2013   Influenza,inj,Quad PF,6+ Mos 01/31/2013, 12/31/2017, 01/04/2019   Influenza-Unspecified 12/01/2013, 12/13/2014, 12/23/2016   Tdap 08/11/2017, 04/14/2018    Screening Tests Health Maintenance  Topic Date Due   Zoster Vaccines- Shingrix (1 of 2) Never done   Pneumonia Vaccine 55+ Years old (1 of 1 - PCV) 07/10/2023 (Originally 08/16/2002)   MAMMOGRAM  02/05/2025 (Originally 03/09/2014)   Medicare Annual Wellness (AWV)  05/04/2024   DTaP/Tdap/Td (3 - Td or Tdap) 04/14/2028   INFLUENZA VACCINE  Completed   DEXA SCAN  Completed   HPV VACCINES  Aged Out   COVID-19 Vaccine  Discontinued    Health Maintenance  Health Maintenance Due  Topic Date Due   Zoster Vaccines- Shingrix (1 of 2) Never done   Health Maintenance Items Addressed: Patient declines vaccines..  Patient stated that she no longer gets mammograms and  does not want a Bone Density.   Additional Screening:  Vision Screening: Recommended annual ophthalmology exams for early detection of glaucoma and other disorders of the eye. Not  up to date, [facility]  Dental Screening: Recommended annual dental exams for proper oral hygiene  Community Resource Referral / Chronic Care Management: CRR required this visit?  No   CCM required this visit?  No     Plan:     I have personally reviewed and noted the following in the patient's chart:   Medical and social history Use of alcohol, tobacco or illicit drugs  Current medications and supplements including opioid prescriptions. Patient is not currently taking opioid prescriptions. Functional ability and status Nutritional status Physical activity Advanced directives List of other physicians Hospitalizations, surgeries, and ER visits in previous 12 months Vitals Screenings to include cognitive, depression, and falls Referrals and appointments  In addition, I have  reviewed and discussed with patient certain preventive protocols, quality metrics, and best practice recommendations. A written personalized care plan for preventive services as well as general preventive health recommendations were provided to patient.     Sydell Axon, LPN   05/03/2949   After Visit Summary: (MyChart) Due to this being a telephonic visit, the after visit summary with patients personalized plan was offered to patient via MyChart   Notes: Please refer to Routing Comments.

## 2023-05-20 NOTE — Assessment & Plan Note (Addendum)
 Physical today 05/21/23.  Has declined mammogram and colonoscopy.  Declined breast exam.

## 2023-05-20 NOTE — Progress Notes (Signed)
 Subjective:    Patient ID: Marshall Cork, female    DOB: 1935/06/21, 88 y.o.   MRN: [id number]  Patient here for  Chief Complaint  Patient presents with   Annual Exam    HPI Here for a physical exam. Last saw neurology 01/13/23 - evaluation cognitive impairment. Recommended starting namenda. Taking seroquel - helping sleep. She reports she is sleeping well. Reports her home stress is better. She feels good. Answering questions appropriately. Reports staying active. No chest pain or sob reported. No fever. Previous cough. Reports is better now. No significant symptoms now. No abdominal pain or bowel change reported.    Past Medical History:  Diagnosis Date   Anxiety    Arthritis    Cataract    History of chicken pox    History of kidney stones October 2013   Hx: UTI (urinary tract infection)    Hyperlipidemia    Hypertension    Rheumatoid arthritis (HCC)    Stroke (HCC)    tia   Past Surgical History:  Procedure Laterality Date   ABDOMINAL HYSTERECTOMY     BREAST BIOPSY Right 1995   CATARACT EXTRACTION W/PHACO Left 04/13/2018   Procedure: CATARACT EXTRACTION PHACO AND INTRAOCULAR LENS PLACEMENT (IOC) Left Eye;  Surgeon: Galen Manila, MD;  Location: [facility] ORS;  Service: Ophthalmology;  Laterality: Left;  [place]  00:47.0 CDE 5.52 Fluid pack lot # [id number]   EYE SURGERY     IR ANGIO INTRA EXTRACRAN SEL COM CAROTID INNOMINATE BILAT MOD SED  07/18/2016   IR ANGIO VERTEBRAL SEL VERTEBRAL BILAT MOD SED  07/18/2016   PARTIAL HYSTERECTOMY  1972   TONSILLECTOMY AND ADENOIDECTOMY  1945   Family History  Problem Relation Age of Onset   Asthma Mother    Diabetes Mother    Cataracts Mother    Heart disease Father    Diabetes Father    Hypertension Father    Cancer Paternal Aunt        Liver   Diabetes Paternal Grandmother    Diabetes Sister    Retinal degeneration Sister    Glaucoma Sister    Social History   Socioeconomic History   Marital status: Married    Spouse name: Not  on file   Number of children: Not on file   Years of education: Not on file   Highest education level: Bachelor's degree (e.g., BA, AB, BS)  Occupational History   Not on file  Tobacco Use   Smoking status: Never   Smokeless tobacco: Never  Vaping Use   Vaping status: Unknown  Substance and Sexual Activity   Alcohol use: No    Alcohol/week: 0.0 standard drinks of alcohol   Drug use: No   Sexual activity: Not on file  Other Topics Concern   Not on file  Social History Narrative   ** Merged History Encounter **       Social Drivers of Health   Financial Resource Strain: Low Risk  (05/20/2023)   Overall Financial Resource Strain (CARDIA)    Difficulty of Paying Living Expenses: Not hard at all  Food Insecurity: No Food Insecurity (05/20/2023)   Hunger Vital Sign    Worried About Running Out of Food in the Last Year: Never true    Ran Out of Food in the Last Year: Never true  Transportation Needs: No Transportation Needs (05/20/2023)   PRAPARE - Administrator, Civil Service (Medical): No    Lack of Transportation (Non-Medical): No  Physical  Activity: Inactive (05/20/2023)   Exercise Vital Sign    Days of Exercise per Week: 0 days    Minutes of Exercise per Session: 0 min  Stress: No Stress Concern Present (05/20/2023)   [employer] of Occupational Health - Occupational Stress Questionnaire    Feeling of Stress : Not at all  Social Connections: Socially Integrated (05/20/2023)   Social Connection and Isolation Panel [NHANES]    Frequency of Communication with Friends and Family: Twice a week    Frequency of Social Gatherings with Friends and Family: Once a week    Attends Religious Services: 1 to 4 times per year    Active Member of [employer] or Organizations: Yes    Attends Banker Meetings: 1 to 4 times per year    Marital Status: Married     Review of Systems  Constitutional:  Negative for appetite change and fever.  HENT:  Negative for  congestion, sinus pressure and sore throat.   Eyes:  Negative for pain and visual disturbance.  Respiratory:  Negative for cough, chest tightness and shortness of breath.   Cardiovascular:  Negative for chest pain, palpitations and leg swelling.  Gastrointestinal:  Negative for abdominal pain, diarrhea, nausea and vomiting.  Genitourinary:  Negative for difficulty urinating and dysuria.  Musculoskeletal:  Negative for joint swelling and myalgias.  Skin:  Negative for color change and rash.  Neurological:  Negative for dizziness and headaches.  Hematological:  Negative for adenopathy. Does not bruise/bleed easily.  Psychiatric/Behavioral:  Negative for agitation and dysphoric mood.        Objective:     BP 118/70   Pulse 66   Temp 98 F (36.7 C)   Resp 16   Ht 5\' 5"  (1.651 m)   Wt 156 lb 3.2 oz (70.9 kg)   SpO2 98%   BMI 25.99 kg/m  Wt Readings from Last 3 Encounters:  05/21/23 156 lb 3.2 oz (70.9 kg)  05/05/23 150 lb (68 kg)  01/21/23 147 lb 12.8 oz (67 kg)    Physical Exam Vitals reviewed.  Constitutional:      General: She is not in acute distress.    Appearance: Normal appearance.  HENT:     Head: Normocephalic and atraumatic.     Right Ear: External ear normal.     Left Ear: External ear normal.     Mouth/Throat:     Pharynx: No oropharyngeal exudate or posterior oropharyngeal erythema.  Eyes:     General: No scleral icterus.       Right eye: No discharge.        Left eye: No discharge.     Conjunctiva/sclera: Conjunctivae normal.  Neck:     Thyroid: No thyromegaly.  Cardiovascular:     Rate and Rhythm: Normal rate and regular rhythm.  Pulmonary:     Effort: No respiratory distress.     Breath sounds: Normal breath sounds. No wheezing.     Comments: Declined breast exam.  Abdominal:     General: Bowel sounds are normal.     Palpations: Abdomen is soft.     Tenderness: There is no abdominal tenderness.  Musculoskeletal:        General: No swelling or  tenderness.     Cervical back: Neck supple. No tenderness.  Lymphadenopathy:     Cervical: No cervical adenopathy.  Skin:    Findings: No erythema or rash.  Neurological:     Mental Status: She is alert.  Psychiatric:  Mood and Affect: Mood normal.        Behavior: Behavior normal.         Outpatient Encounter Medications as of 05/21/2023  Medication Sig   amLODipine (NORVASC) 5 MG tablet Take 1 tablet (5 mg total) by mouth daily.   atenolol (TENORMIN) 25 MG tablet Take 1 tablet (25 mg total) by mouth daily.   cholecalciferol (VITAMIN D) 25 MCG tablet Take 5 tablets (5,000 Units total) by mouth daily.   clopidogrel (PLAVIX) 75 MG tablet Take 1 tablet (75 mg total) by mouth daily.   Evolocumab (REPATHA SURECLICK) 140 MG/ML SOAJ Inject 140 mg into the skin every 14 (fourteen) days.   fluticasone (FLONASE) 50 MCG/ACT nasal spray Place 1 spray into both nostrils daily.   folic acid (FOLVITE) 1 MG tablet Take 2 mg by mouth daily.    loratadine (CLARITIN) 10 MG tablet Take 1 tablet (10 mg total) by mouth daily.   magnesium oxide (MAG-OX) 400 MG tablet Take by mouth.   memantine (NAMENDA) 5 MG tablet Take 1 tablet (5 mg total) by mouth at bedtime.   PARoxetine (PAXIL) 20 MG tablet Take 1 tablet (20 mg total) by mouth daily.   QUEtiapine (SEROQUEL) 25 MG tablet Take 1 tablet (25 mg total) by mouth at bedtime.   [DISCONTINUED] ascorbic acid (VITAMIN C) 1000 MG tablet Take 1,000 mg by mouth daily. (Patient not taking: Reported on 05/05/2023)   [DISCONTINUED] influenza vaccine adjuvanted (FLUAD) 0.5 ML injection Inject into the muscle.   No facility-administered encounter medications on file as of 05/21/2023.     Lab Results  Component Value Date   WBC 13.5 (H) 05/21/2023   HGB 13.1 05/21/2023   HCT 40.1 05/21/2023   PLT 351.0 05/21/2023   GLUCOSE 109 (H) 05/21/2023   CHOL 184 05/21/2023   TRIG 275.0 (H) 05/21/2023   HDL 39.90 05/21/2023   LDLDIRECT 181.0 05/28/2021   LDLCALC  89 05/21/2023   ALT 21 05/21/2023   AST 23 05/21/2023   NA 139 05/21/2023   K 4.2 05/21/2023   CL 103 05/21/2023   CREATININE 1.16 05/21/2023   BUN 21 05/21/2023   CO2 28 05/21/2023   TSH 2.12 05/21/2023   INR 1.07 07/18/2016   HGBA1C 6.3 05/21/2023       Assessment & Plan:  Bilateral carotid artery disease, unspecified type [facility]) Assessment & Plan: Evaluated [facility] 04/30/22 - Carotid ultrasound - normal extracranial vessels with no significant stenosis of the bilateral internal carotid arteries. The bilateral vertebral arteries have antegrade flow with normal flow hemodynamics in the bilateral subclavian arteries.  Recommended f/u prn.    Essential hypertension Assessment & Plan: Continue amlodipine and atenolol.   Follow blood pressure.  Follow metabolic panel.   Orders: -     Basic metabolic panel  Hypercholesterolemia Assessment & Plan: Intolerance to zetia and crestor. Need to continue repatha and tolerating.  Low cholesterol diet and exercise.  Follow lipid panel.   Orders: -     CBC with Differential/Platelet -     Hepatic function panel -     Lipid panel -     TSH  Hyperglycemia Assessment & Plan: Low carb diet and exercise. Follow met b and A1c.   Orders: -     Hemoglobin A1c  Mixed Alzheimer's and vascular dementia [facility]) Assessment & Plan: Seeing neurology.  Recommended namenda.  Feels stable. Continue seroquel.  Follow. Sleeping better.    Health care maintenance Assessment & Plan: Physical today 05/21/23.  Has declined mammogram and colonoscopy.  Declined breast exam.    Polymyalgia rheumatica (HCC) Assessment & Plan: Off MTX.  No pain reported.  Follow. Stable.    Osteoporosis, unspecified osteoporosis type, unspecified pathological fracture presence Assessment & Plan: Had reaction to reclast.  Calcium, vitamin D and weight bearing exercise. Needs f/u bone density and consider prolia.  Plan f/u.    Aneurysm of ascending aorta without rupture  [facility]) Assessment & Plan: Saw [facility] 04/30/22 - Aorta - noninvasive studies show no evidence of an abdominal aortic aneurysm. Previous thoracic aortic aneurysm only measured 3.7 cm which is technically ectatic. Recommended f/u prn.    Anxiety Assessment & Plan: Continue paxil. On seroquel. Sleeping better. Overall feels better. Follow.       Dale Maxwell, MD

## 2023-05-20 NOTE — Assessment & Plan Note (Signed)
Evaluated [facility] 04/30/22 - Carotid ultrasound - normal extracranial vessels with no significant stenosis of the bilateral internal carotid arteries. The bilateral vertebral arteries have antegrade flow with normal flow hemodynamics in the bilateral subclavian arteries.  Recommended f/u prn.

## 2023-05-21 ENCOUNTER — Ambulatory Visit (INDEPENDENT_AMBULATORY_CARE_PROVIDER_SITE_OTHER): Payer: PPO | Admitting: Internal Medicine

## 2023-05-21 ENCOUNTER — Encounter: Payer: Self-pay | Admitting: Internal Medicine

## 2023-05-21 VITALS — BP 118/70 | HR 66 | Temp 98.0°F | Resp 16 | Ht 65.0 in | Wt 156.2 lb

## 2023-05-21 DIAGNOSIS — F0150 Vascular dementia without behavioral disturbance: Secondary | ICD-10-CM | POA: Diagnosis not present

## 2023-05-21 DIAGNOSIS — I779 Disorder of arteries and arterioles, unspecified: Secondary | ICD-10-CM | POA: Diagnosis not present

## 2023-05-21 DIAGNOSIS — E7800 Pure hypercholesterolemia, unspecified: Secondary | ICD-10-CM

## 2023-05-21 DIAGNOSIS — Z0000 Encounter for general adult medical examination without abnormal findings: Secondary | ICD-10-CM | POA: Diagnosis not present

## 2023-05-21 DIAGNOSIS — F419 Anxiety disorder, unspecified: Secondary | ICD-10-CM | POA: Diagnosis not present

## 2023-05-21 DIAGNOSIS — M810 Age-related osteoporosis without current pathological fracture: Secondary | ICD-10-CM

## 2023-05-21 DIAGNOSIS — I7121 Aneurysm of the ascending aorta, without rupture: Secondary | ICD-10-CM | POA: Diagnosis not present

## 2023-05-21 DIAGNOSIS — G309 Alzheimer's disease, unspecified: Secondary | ICD-10-CM | POA: Diagnosis not present

## 2023-05-21 DIAGNOSIS — R739 Hyperglycemia, unspecified: Secondary | ICD-10-CM

## 2023-05-21 DIAGNOSIS — F0280 Dementia in other diseases classified elsewhere without behavioral disturbance: Secondary | ICD-10-CM | POA: Diagnosis not present

## 2023-05-21 DIAGNOSIS — M353 Polymyalgia rheumatica: Secondary | ICD-10-CM

## 2023-05-21 DIAGNOSIS — I10 Essential (primary) hypertension: Secondary | ICD-10-CM

## 2023-05-21 LAB — LIPID PANEL
Cholesterol: 184 mg/dL (ref 0–200)
HDL: 39.9 mg/dL (ref >39.00)
LDL Cholesterol: 89 mg/dL (ref 0–99)
NonHDL: 144.15
Total CHOL/HDL Ratio: 5
Triglycerides: 275 mg/dL — ABNORMAL HIGH (ref 0.0–149.0)
VLDL: 55 mg/dL — ABNORMAL HIGH (ref 0.0–40.0)

## 2023-05-21 LAB — HEMOGLOBIN A1C: Hgb A1c MFr Bld: 6.3 % (ref 4.6–6.5)

## 2023-05-21 LAB — CBC WITH DIFFERENTIAL/PLATELET
Basophils Absolute: 0.1 10*3/uL (ref 0.0–0.1)
Basophils Relative: 0.9 % (ref 0.0–3.0)
Eosinophils Absolute: 0.3 10*3/uL (ref 0.0–0.7)
Eosinophils Relative: 2.4 % (ref 0.0–5.0)
HCT: 40.1 % (ref 36.0–46.0)
Hemoglobin: 13.1 g/dL (ref 12.0–15.0)
Lymphocytes Relative: 21.2 % (ref 12.0–46.0)
Lymphs Abs: 2.9 10*3/uL (ref 0.7–4.0)
MCHC: 32.7 g/dL (ref 30.0–36.0)
MCV: 90.9 fl (ref 78.0–100.0)
Monocytes Absolute: 1 10*3/uL (ref 0.1–1.0)
Monocytes Relative: 7.4 % (ref 3.0–12.0)
Neutro Abs: 9.2 10*3/uL — ABNORMAL HIGH (ref 1.4–7.7)
Neutrophils Relative %: 68.1 % (ref 43.0–77.0)
Platelets: 351 10*3/uL (ref 150.0–400.0)
RBC: 4.41 Mil/uL (ref 3.87–5.11)
RDW: 14.8 % (ref 11.5–15.5)
WBC: 13.5 10*3/uL — ABNORMAL HIGH (ref 4.0–10.5)

## 2023-05-21 LAB — BASIC METABOLIC PANEL
BUN: 21 mg/dL (ref 6–23)
CO2: 28 meq/L (ref 19–32)
Calcium: 9.9 mg/dL (ref 8.4–10.5)
Chloride: 103 meq/L (ref 96–112)
Creatinine, Ser: 1.16 mg/dL (ref 0.40–1.20)
GFR: 42.91 mL/min — ABNORMAL LOW (ref >60.00)
Glucose, Bld: 109 mg/dL — ABNORMAL HIGH (ref 70–99)
Potassium: 4.2 meq/L (ref 3.5–5.1)
Sodium: 139 meq/L (ref 135–145)

## 2023-05-21 LAB — HEPATIC FUNCTION PANEL
ALT: 21 U/L (ref 0–35)
AST: 23 U/L (ref 0–37)
Albumin: 4.3 g/dL (ref 3.5–5.2)
Alkaline Phosphatase: 43 U/L (ref 39–117)
Bilirubin, Direct: 0.1 mg/dL (ref 0.0–0.3)
Total Bilirubin: 0.4 mg/dL (ref 0.2–1.2)
Total Protein: 7.2 g/dL (ref 6.0–8.3)

## 2023-05-21 LAB — TSH: TSH: 2.12 u[IU]/mL (ref 0.35–5.50)

## 2023-05-23 ENCOUNTER — Encounter: Payer: Self-pay | Admitting: Internal Medicine

## 2023-05-23 NOTE — Assessment & Plan Note (Signed)
Continue amlodipine and atenolol.   Follow blood pressure.  Follow metabolic panel.  

## 2023-05-23 NOTE — Assessment & Plan Note (Signed)
 Off MTX.  No pain reported.  Follow. Stable.

## 2023-05-23 NOTE — Assessment & Plan Note (Signed)
 Intolerance to zetia and crestor. Need to continue repatha and tolerating.  Low cholesterol diet and exercise.  Follow lipid panel.

## 2023-05-23 NOTE — Assessment & Plan Note (Signed)
 Seeing neurology.  Recommended namenda.  Feels stable. Continue seroquel.  Follow. Sleeping better.

## 2023-05-23 NOTE — Assessment & Plan Note (Addendum)
 Had reaction to reclast.  Calcium, vitamin D and weight bearing exercise. Needs f/u bone density and consider prolia.  Plan f/u.

## 2023-05-23 NOTE — Assessment & Plan Note (Signed)
 Low-carb diet and exercise.  Follow met b and A1c.

## 2023-05-23 NOTE — Assessment & Plan Note (Signed)
Saw [facility] 04/30/22 - Aorta - noninvasive studies show no evidence of an abdominal aortic aneurysm. Previous thoracic aortic aneurysm only measured 3.7 cm which is technically ectatic. Recommended f/u prn.

## 2023-05-23 NOTE — Assessment & Plan Note (Signed)
 Continue paxil. On seroquel. Sleeping better. Overall feels better. Follow.

## 2023-05-26 ENCOUNTER — Encounter: Payer: Self-pay | Admitting: Internal Medicine

## 2023-05-26 NOTE — Telephone Encounter (Signed)
 Called and talked to [facility] She is wondering if we could do a cxr? Patient started feeling some better but is still having coughing fits. She is not getting up and moving a lot and she does not try to cough anything up. The mucinex and delsym help but have not cleared her symptoms and it has been more than 3 weeks.

## 2023-05-26 NOTE — Telephone Encounter (Signed)
 I am ok with checking cxr and if cough persists, will need to be reevaluated. I can order cxr if agreeable.

## 2023-05-27 NOTE — Telephone Encounter (Signed)
 I am ok with ordering a cxr and I can place an order for an UGI with modified barium swallow - if Beth Espinoza is agreeable to go.

## 2023-05-27 NOTE — Telephone Encounter (Signed)
 Daughter is going to discuss with patient and let me know if they want to proceed.

## 2023-06-22 ENCOUNTER — Other Ambulatory Visit: Payer: Self-pay | Admitting: Internal Medicine

## 2023-06-23 ENCOUNTER — Other Ambulatory Visit: Payer: Self-pay

## 2023-06-23 MED ORDER — FLUTICASONE PROPIONATE 50 MCG/ACT NA SUSP
1.0000 | Freq: Every day | NASAL | 0 refills | Status: DC
  Filled 2023-06-23: qty 16, 60d supply, fill #0

## 2023-06-28 ENCOUNTER — Encounter: Payer: Self-pay | Admitting: Internal Medicine

## 2023-06-29 NOTE — Telephone Encounter (Signed)
 Need to confirm if she is having any acute symptoms - cough, etc.  I would like to see her and discuss - can then order test needed.

## 2023-07-02 DIAGNOSIS — F0150 Vascular dementia without behavioral disturbance: Secondary | ICD-10-CM | POA: Diagnosis not present

## 2023-07-02 DIAGNOSIS — R4189 Other symptoms and signs involving cognitive functions and awareness: Secondary | ICD-10-CM | POA: Diagnosis not present

## 2023-07-02 DIAGNOSIS — F0280 Dementia in other diseases classified elsewhere without behavioral disturbance: Secondary | ICD-10-CM | POA: Diagnosis not present

## 2023-07-02 DIAGNOSIS — G309 Alzheimer's disease, unspecified: Secondary | ICD-10-CM | POA: Diagnosis not present

## 2023-07-02 DIAGNOSIS — R1319 Other dysphagia: Secondary | ICD-10-CM | POA: Diagnosis not present

## 2023-07-06 ENCOUNTER — Other Ambulatory Visit: Payer: Self-pay | Admitting: Internal Medicine

## 2023-07-07 ENCOUNTER — Other Ambulatory Visit: Payer: Self-pay

## 2023-07-07 MED ORDER — QUETIAPINE FUMARATE 25 MG PO TABS
25.0000 mg | ORAL_TABLET | Freq: Every day | ORAL | 1 refills | Status: DC
  Filled 2023-07-07: qty 30, 30d supply, fill #0
  Filled 2023-08-04: qty 30, 30d supply, fill #1

## 2023-07-20 ENCOUNTER — Other Ambulatory Visit: Payer: Self-pay

## 2023-07-20 MED ORDER — MEMANTINE HCL 5 MG PO TABS
5.0000 mg | ORAL_TABLET | Freq: Every day | ORAL | 1 refills | Status: DC
  Filled 2023-07-20: qty 90, 90d supply, fill #0
  Filled 2023-10-19: qty 90, 90d supply, fill #1

## 2023-08-04 ENCOUNTER — Other Ambulatory Visit: Payer: Self-pay | Admitting: Internal Medicine

## 2023-08-04 ENCOUNTER — Other Ambulatory Visit: Payer: Self-pay

## 2023-08-05 ENCOUNTER — Other Ambulatory Visit: Payer: Self-pay

## 2023-08-06 ENCOUNTER — Other Ambulatory Visit: Payer: Self-pay

## 2023-08-06 MED FILL — Clopidogrel Bisulfate Tab 75 MG (Base Equiv): ORAL | 90 days supply | Qty: 90 | Fill #0 | Status: AC

## 2023-08-07 ENCOUNTER — Other Ambulatory Visit: Payer: Self-pay

## 2023-08-28 DIAGNOSIS — L821 Other seborrheic keratosis: Secondary | ICD-10-CM | POA: Diagnosis not present

## 2023-08-29 ENCOUNTER — Other Ambulatory Visit: Payer: Self-pay | Admitting: Internal Medicine

## 2023-08-31 ENCOUNTER — Other Ambulatory Visit: Payer: Self-pay | Admitting: Internal Medicine

## 2023-08-31 ENCOUNTER — Other Ambulatory Visit: Payer: Self-pay

## 2023-09-01 ENCOUNTER — Other Ambulatory Visit: Payer: Self-pay

## 2023-09-02 ENCOUNTER — Other Ambulatory Visit: Payer: Self-pay

## 2023-09-02 MED FILL — Atenolol Tab 25 MG: ORAL | 90 days supply | Qty: 90 | Fill #0 | Status: AC

## 2023-09-02 MED FILL — Amlodipine Besylate Tab 5 MG (Base Equivalent): ORAL | 90 days supply | Qty: 90 | Fill #0 | Status: AC

## 2023-09-02 MED FILL — Quetiapine Fumarate Tab 25 MG: ORAL | 30 days supply | Qty: 30 | Fill #0 | Status: AC

## 2023-09-02 NOTE — Telephone Encounter (Signed)
 Rx refilled - amlodipine  ,seroquel , atenolol . Keep upcoming appt.

## 2023-09-21 ENCOUNTER — Ambulatory Visit (INDEPENDENT_AMBULATORY_CARE_PROVIDER_SITE_OTHER): Admitting: Internal Medicine

## 2023-09-21 ENCOUNTER — Other Ambulatory Visit: Payer: Self-pay

## 2023-09-21 ENCOUNTER — Ambulatory Visit
Admission: RE | Admit: 2023-09-21 | Discharge: 2023-09-21 | Disposition: A | Discharge status: Home / Self Care | Attending: Internal Medicine | Admitting: Internal Medicine

## 2023-09-21 ENCOUNTER — Encounter: Payer: Self-pay | Admitting: Internal Medicine

## 2023-09-21 ENCOUNTER — Ambulatory Visit: Payer: Self-pay | Admitting: Internal Medicine

## 2023-09-21 ENCOUNTER — Ambulatory Visit
Admission: RE | Admit: 2023-09-21 | Discharge: 2023-09-21 | Disposition: A | Discharge status: Home / Self Care | Source: Ambulatory Visit | Attending: Internal Medicine | Admitting: Internal Medicine

## 2023-09-21 VITALS — BP 120/70 | HR 67 | Resp 16 | Ht 65.0 in | Wt 154.4 lb

## 2023-09-21 DIAGNOSIS — F419 Anxiety disorder, unspecified: Secondary | ICD-10-CM | POA: Diagnosis not present

## 2023-09-21 DIAGNOSIS — R739 Hyperglycemia, unspecified: Secondary | ICD-10-CM

## 2023-09-21 DIAGNOSIS — G309 Alzheimer's disease, unspecified: Secondary | ICD-10-CM | POA: Diagnosis not present

## 2023-09-21 DIAGNOSIS — F0280 Dementia in other diseases classified elsewhere without behavioral disturbance: Secondary | ICD-10-CM | POA: Diagnosis not present

## 2023-09-21 DIAGNOSIS — E7800 Pure hypercholesterolemia, unspecified: Secondary | ICD-10-CM

## 2023-09-21 DIAGNOSIS — F0150 Vascular dementia without behavioral disturbance: Secondary | ICD-10-CM

## 2023-09-21 DIAGNOSIS — R053 Chronic cough: Secondary | ICD-10-CM | POA: Insufficient documentation

## 2023-09-21 DIAGNOSIS — I7121 Aneurysm of the ascending aorta, without rupture: Secondary | ICD-10-CM | POA: Diagnosis not present

## 2023-09-21 DIAGNOSIS — D72829 Elevated white blood cell count, unspecified: Secondary | ICD-10-CM | POA: Diagnosis not present

## 2023-09-21 DIAGNOSIS — I10 Essential (primary) hypertension: Secondary | ICD-10-CM | POA: Diagnosis not present

## 2023-09-21 DIAGNOSIS — I779 Disorder of arteries and arterioles, unspecified: Secondary | ICD-10-CM

## 2023-09-21 MED ORDER — CLOPIDOGREL BISULFATE 75 MG PO TABS
75.0000 mg | ORAL_TABLET | Freq: Every day | ORAL | 1 refills | Status: DC
  Filled 2023-09-21 – 2023-11-10 (×2): qty 90, 90d supply, fill #0

## 2023-09-21 MED ORDER — ATENOLOL 25 MG PO TABS
25.0000 mg | ORAL_TABLET | Freq: Every day | ORAL | 1 refills | Status: DC
  Filled 2023-09-21 – 2023-11-26 (×2): qty 90, 90d supply, fill #0

## 2023-09-21 MED ORDER — AMLODIPINE BESYLATE 5 MG PO TABS
5.0000 mg | ORAL_TABLET | Freq: Every day | ORAL | 1 refills | Status: DC
  Filled 2023-09-21 – 2023-11-26 (×2): qty 90, 90d supply, fill #0

## 2023-09-21 MED ORDER — PAROXETINE HCL 20 MG PO TABS
20.0000 mg | ORAL_TABLET | Freq: Every day | ORAL | 1 refills | Status: DC
  Filled 2023-09-21 – 2023-10-08 (×2): qty 90, 90d supply, fill #0

## 2023-09-21 NOTE — Progress Notes (Signed)
 Subjective:    [name], female    DOB: 09-01-1935, 87 y.o.   MRN: [id number]  Patient here for  Chief Complaint  Patient presents with   Medical Management of Chronic Issues    HPI Here for a scheduled follow up - follow up regarding hypertension, hyperglycemia and hypercholesterolemia. She is accompanied by her daughter. History obtained from both of them.  Saw neurology 07/02/23 - f/u mixed dementia. Recommended continuing memantine  5mg  q hs and continue seroquel  50mg  q hs. Her daughter has moved up from [place]  and staying with her now. Prepares meals for her. Doing better since the daughter has moved in. No chest pain or sob reported. Does report some persistent cough - daily. Daughter reports that she notices this occurs when she is eating/drinking. Will take TUMS. No abdominal pain. Bowels moving. Is off her cholesterol medication - repatha . Stopped taking >2 months ago. Does take allegra.    Past Medical History:  Diagnosis Date   Anxiety    Arthritis    Cataract    History of chicken pox    History of kidney stones October 2013   Hx: UTI (urinary tract infection)    Hyperlipidemia    Hypertension    Rheumatoid arthritis (HCC)    Stroke (HCC)    tia   Past Surgical History:  Procedure Laterality Date   ABDOMINAL HYSTERECTOMY     BREAST BIOPSY Right 1995   CATARACT EXTRACTION W/PHACO Left 04/13/2018   Procedure: CATARACT EXTRACTION PHACO AND INTRAOCULAR LENS PLACEMENT (IOC) Left Eye;  Surgeon: Jaye Fallow, MD;  Location: [facility] ORS;  Service: Ophthalmology;  Laterality: Left;  US   00:47.0 CDE 5.52 Fluid pack lot # [id number]   EYE SURGERY     IR ANGIO INTRA EXTRACRAN SEL COM CAROTID INNOMINATE BILAT MOD SED  07/18/2016   IR ANGIO VERTEBRAL SEL VERTEBRAL BILAT MOD SED  07/18/2016   PARTIAL HYSTERECTOMY  1972   TONSILLECTOMY AND ADENOIDECTOMY  1945   Family History  Problem Relation Age of Onset   Asthma Mother    Diabetes Mother    Cataracts Mother     Heart disease Father    Diabetes Father    Hypertension Father    Cancer Paternal Aunt        Liver   Diabetes Paternal Grandmother    Diabetes Sister    Retinal degeneration Sister    Glaucoma Sister    Social History   Socioeconomic History   Marital status: Married    Spouse name: Not on file   Number of children: Not on file   Years of education: Not on file   Highest education level: Bachelor's degree (e.g., BA, AB, BS)  Occupational History   Not on file  Tobacco Use   Smoking status: Never   Smokeless tobacco: Never  Vaping Use   Vaping status: Unknown  Substance and Sexual Activity   Alcohol use: No    Alcohol/week: 0.0 standard drinks of alcohol   Drug use: No   Sexual activity: Not on file  Other Topics Concern   Not on file  Social History Narrative   ** Merged History Encounter **       Social Drivers of Health   Financial Resource Strain: Low Risk  (05/20/2023)   Overall Financial Resource Strain (CARDIA)    Difficulty of Paying Living Expenses: Not hard at all  Food Insecurity: No Food Insecurity (05/20/2023)   Hunger Vital Sign    Worried About  Running Out of Food in the Last Year: Never true    Ran Out of Food in the Last Year: Never true  Transportation Needs: No Transportation Needs (05/20/2023)   PRAPARE - Administrator, Civil Service (Medical): No    Lack of Transportation (Non-Medical): No  Physical Activity: Inactive (05/20/2023)   Exercise Vital Sign    Days of Exercise per Week: 0 days    Minutes of Exercise per Session: 0 min  Stress: No Stress Concern Present (05/20/2023)   [employer] of Occupational Health - Occupational Stress Questionnaire    Feeling of Stress : Not at all  Social Connections: Socially Integrated (05/20/2023)   Social Connection and Isolation Panel    Frequency of Communication with Friends and Family: Twice a week    Frequency of Social Gatherings with Friends and Family: Once a week    Attends  Religious Services: 1 to 4 times per year    Active Member of [employer] or Organizations: Yes    Attends Banker Meetings: 1 to 4 times per year    Marital Status: Married     Review of Systems  Constitutional:  Negative for appetite change and unexpected weight change.  HENT:  Negative for congestion and sinus pressure.   Respiratory:  Positive for cough. Negative for chest tightness and shortness of breath.   Cardiovascular:  Negative for chest pain, palpitations and leg swelling.  Gastrointestinal:  Negative for abdominal pain, diarrhea, nausea and vomiting.  Genitourinary:  Negative for difficulty urinating and dysuria.  Musculoskeletal:  Negative for joint swelling and myalgias.  Skin:  Negative for color change and rash.  Neurological:  Negative for dizziness and headaches.  Psychiatric/Behavioral:  Negative for agitation and dysphoric mood.        Objective:     BP 120/70   Pulse 67   Resp 16   Ht 5' 5 (1.651 m)   Wt 154 lb 6.4 oz (70 kg)   SpO2 98%   BMI 25.69 kg/m  Wt Readings from Last 3 Encounters:  09/21/23 154 lb 6.4 oz (70 kg)  05/21/23 156 lb 3.2 oz (70.9 kg)  05/05/23 150 lb (68 kg)    Physical Exam Vitals reviewed.  Constitutional:      General: She is not in acute distress.    Appearance: Normal appearance.  HENT:     Head: Normocephalic and atraumatic.     Right Ear: External ear normal.     Left Ear: External ear normal.     Mouth/Throat:     Pharynx: No oropharyngeal exudate or posterior oropharyngeal erythema.  Eyes:     General: No scleral icterus.       Right eye: No discharge.        Left eye: No discharge.     Conjunctiva/sclera: Conjunctivae normal.  Neck:     Thyroid : No thyromegaly.  Cardiovascular:     Rate and Rhythm: Normal rate and regular rhythm.  Pulmonary:     Effort: No respiratory distress.     Breath sounds: Normal breath sounds. No wheezing.  Abdominal:     General: Bowel sounds are normal.      Palpations: Abdomen is soft.     Tenderness: There is no abdominal tenderness.  Musculoskeletal:        General: No swelling or tenderness.     Cervical back: Neck supple. No tenderness.  Lymphadenopathy:     Cervical: No cervical adenopathy.  Skin:  Findings: No erythema or rash.  Neurological:     Mental Status: She is alert.  Psychiatric:        Mood and Affect: Mood normal.        Behavior: Behavior normal.         Outpatient Encounter Medications as of 09/21/2023  Medication Sig   amLODipine  (NORVASC ) 5 MG tablet Take 1 tablet (5 mg total) by mouth daily.   atenolol  (TENORMIN ) 25 MG tablet Take 1 tablet (25 mg total) by mouth daily.   cholecalciferol  (VITAMIN D ) 25 MCG tablet Take 5 tablets (5,000 Units total) by mouth daily.   clopidogrel  (PLAVIX ) 75 MG tablet Take 1 tablet (75 mg total) by mouth daily.   Evolocumab  (REPATHA  SURECLICK) 140 MG/ML SOAJ Inject 140 mg into the skin every 14 (fourteen) days.   fluticasone  (FLONASE ) 50 MCG/ACT nasal spray Place 1 spray into both nostrils daily.   folic acid  (FOLVITE ) 1 MG tablet Take 2 mg by mouth daily.    loratadine  (CLARITIN ) 10 MG tablet Take 1 tablet (10 mg total) by mouth daily.   magnesium oxide (MAG-OX) 400 MG tablet Take by mouth.   memantine  (NAMENDA ) 5 MG tablet Take 1 tablet (5 mg total) by mouth at bedtime.   PARoxetine  (PAXIL ) 20 MG tablet Take 1 tablet (20 mg total) by mouth daily.   QUEtiapine  (SEROQUEL ) 25 MG tablet Take 1 tablet (25 mg total) by mouth at bedtime.   [DISCONTINUED] amLODipine  (NORVASC ) 5 MG tablet Take 1 tablet (5 mg total) by mouth daily.   [DISCONTINUED] atenolol  (TENORMIN ) 25 MG tablet Take 1 tablet (25 mg total) by mouth daily.   [DISCONTINUED] clopidogrel  (PLAVIX ) 75 MG tablet Take 1 tablet (75 mg total) by mouth daily.   [DISCONTINUED] PARoxetine  (PAXIL ) 20 MG tablet Take 1 tablet (20 mg total) by mouth daily.   No facility-administered encounter medications on file as of 09/21/2023.      Lab Results  Component Value Date   WBC 12.7 (H) 09/21/2023   HGB 13.0 09/21/2023   HCT 39.2 09/21/2023   PLT 349.0 09/21/2023   GLUCOSE 116 (H) 09/21/2023   CHOL 233 (H) 09/21/2023   TRIG 245.0 (H) 09/21/2023   HDL 36.90 (L) 09/21/2023   LDLDIRECT 181.0 05/28/2021   LDLCALC 147 (H) 09/21/2023   ALT 17 09/21/2023   AST 21 09/21/2023   NA 138 09/21/2023   K 4.3 09/21/2023   CL 102 09/21/2023   CREATININE 1.06 09/21/2023   BUN 21 09/21/2023   CO2 28 09/21/2023   TSH 2.12 05/21/2023   INR 1.07 07/18/2016   HGBA1C 6.3 09/21/2023       Assessment & Plan:  Leukocytosis, unspecified type Assessment & Plan: White blood cell count slightly elevated on recent labs. Recheck cbc today.   Orders: -     CBC with Differential/Platelet  Hypercholesterolemia Assessment & Plan: Intolerance to zetia  and crestor . Has stopped repatha .   Low cholesterol diet and exercise.  Follow lipid panel. Check lipid panel today.   Orders: -     Lipid panel -     Hepatic function panel  Hyperglycemia Assessment & Plan: Low carb diet and exercise. Follow met b and A1c.   Orders: -     Hemoglobin A1c  Essential hypertension Assessment & Plan: Continue amlodipine  and atenolol .   Follow blood pressure.  Follow metabolic panel. No changes in medication today.   Orders: -     Basic metabolic panel with GFR  Persistent cough Assessment &  Plan: Persistent cough. Appears to occur when eating/drinking. Discussed further evaluation. Will check cxr. Also, refer to ST for evaluation and treatment. Pepcid 20mg  q day.   Orders: -     DG Chest 2 View; Future -     Ambulatory referral to Speech Therapy  Anxiety Assessment & Plan: Continues on paxil  and seroquel . Overall doing better since daughter moved in. No change in medication. Follow.    Aneurysm of ascending aorta without rupture [facility]) Assessment & Plan: Saw [facility] 04/30/22 - Aorta - noninvasive studies show no evidence of an abdominal  aortic aneurysm. Previous thoracic aortic aneurysm only measured 3.7 cm which is technically ectatic. Recommended f/u prn. Continue blood pressure control.    Bilateral carotid artery disease, unspecified type [facility]) Assessment & Plan: Evaluated [facility] 04/30/22 - Carotid ultrasound - normal extracranial vessels with no significant stenosis of the bilateral internal carotid arteries. The bilateral vertebral arteries have antegrade flow with normal flow hemodynamics in the bilateral subclavian arteries.  Recommended f/u prn.    Mixed Alzheimer's and vascular dementia [facility]) Assessment & Plan: Followed by neurology. Recommended to continue namenda . Continues seroquel  in the evening. Follow.    Other orders -     amLODIPine  Besylate; Take 1 tablet (5 mg total) by mouth daily.  Dispense: 90 tablet; Refill: 1 -     Atenolol ; Take 1 tablet (25 mg total) by mouth daily.  Dispense: 90 tablet; Refill: 1 -     Clopidogrel  Bisulfate; Take 1 tablet (75 mg total) by mouth daily.  Dispense: 90 tablet; Refill: 1 -     PARoxetine  HCl; Take 1 tablet (20 mg total) by mouth daily.  Dispense: 90 tablet; Refill: 1  I spent 45 minutes with the patient .   Time spent discussing her current concerns and symptoms. Specifically time spent discussing her cough and further w/up as well as cholesterol and previous studies.  Time also spent discussing further w/up, evaluation and treatment.     Allena Hamilton, MD

## 2023-09-22 LAB — LIPID PANEL
Cholesterol: 233 mg/dL — ABNORMAL HIGH (ref 0–200)
HDL: 36.9 mg/dL — ABNORMAL LOW (ref >39.00)
LDL Cholesterol: 147 mg/dL — ABNORMAL HIGH (ref 0–99)
NonHDL: 195.94
Total CHOL/HDL Ratio: 6
Triglycerides: 245 mg/dL — ABNORMAL HIGH (ref 0.0–149.0)
VLDL: 49 mg/dL — ABNORMAL HIGH (ref 0.0–40.0)

## 2023-09-22 LAB — HEMOGLOBIN A1C: Hgb A1c MFr Bld: 6.3 % (ref 4.6–6.5)

## 2023-09-22 LAB — CBC WITH DIFFERENTIAL/PLATELET
Basophils Absolute: 0.2 K/uL — ABNORMAL HIGH (ref 0.0–0.1)
Basophils Relative: 1.2 % (ref 0.0–3.0)
Eosinophils Absolute: 0.3 K/uL (ref 0.0–0.7)
Eosinophils Relative: 2.7 % (ref 0.0–5.0)
HCT: 39.2 % (ref 36.0–46.0)
Hemoglobin: 13 g/dL (ref 12.0–15.0)
Lymphocytes Relative: 25.6 % (ref 12.0–46.0)
Lymphs Abs: 3.2 K/uL (ref 0.7–4.0)
MCHC: 33.2 g/dL (ref 30.0–36.0)
MCV: 90.7 fl (ref 78.0–100.0)
Monocytes Absolute: 0 K/uL — ABNORMAL LOW (ref 0.1–1.0)
Monocytes Relative: 0.2 % — ABNORMAL LOW (ref 3.0–12.0)
Neutro Abs: 8.9 K/uL — ABNORMAL HIGH (ref 1.4–7.7)
Neutrophils Relative %: 70.3 % (ref 43.0–77.0)
Platelets: 349 K/uL (ref 150.0–400.0)
RBC: 4.32 Mil/uL (ref 3.87–5.11)
RDW: 14.2 % (ref 11.5–15.5)
WBC: 12.7 K/uL — ABNORMAL HIGH (ref 4.0–10.5)

## 2023-09-22 LAB — BASIC METABOLIC PANEL WITH GFR
BUN: 21 mg/dL (ref 6–23)
CO2: 28 meq/L (ref 19–32)
Calcium: 10 mg/dL (ref 8.4–10.5)
Chloride: 102 meq/L (ref 96–112)
Creatinine, Ser: 1.06 mg/dL (ref 0.40–1.20)
GFR: 47.7 mL/min — ABNORMAL LOW (ref >60.00)
Glucose, Bld: 116 mg/dL — ABNORMAL HIGH (ref 70–99)
Potassium: 4.3 meq/L (ref 3.5–5.1)
Sodium: 138 meq/L (ref 135–145)

## 2023-09-22 LAB — HEPATIC FUNCTION PANEL
ALT: 17 U/L (ref 0–35)
AST: 21 U/L (ref 0–37)
Albumin: 4.4 g/dL (ref 3.5–5.2)
Alkaline Phosphatase: 41 U/L (ref 39–117)
Bilirubin, Direct: 0 mg/dL (ref 0.0–0.3)
Total Bilirubin: 0.4 mg/dL (ref 0.2–1.2)
Total Protein: 7.2 g/dL (ref 6.0–8.3)

## 2023-09-27 ENCOUNTER — Encounter: Payer: Self-pay | Admitting: Internal Medicine

## 2023-09-27 NOTE — Assessment & Plan Note (Signed)
 White blood cell count slightly elevated on recent labs. Recheck cbc today.

## 2023-09-27 NOTE — Assessment & Plan Note (Addendum)
 Intolerance to zetia  and crestor . Has stopped repatha .   Low cholesterol diet and exercise.  Follow lipid panel. Check lipid panel today.

## 2023-09-27 NOTE — Assessment & Plan Note (Signed)
 Continues on paxil  and seroquel . Overall doing better since daughter moved in. No change in medication. Follow.

## 2023-09-27 NOTE — Assessment & Plan Note (Signed)
Evaluated [facility] 04/30/22 - Carotid ultrasound - normal extracranial vessels with no significant stenosis of the bilateral internal carotid arteries. The bilateral vertebral arteries have antegrade flow with normal flow hemodynamics in the bilateral subclavian arteries.  Recommended f/u prn.

## 2023-09-27 NOTE — Assessment & Plan Note (Signed)
 Continue amlodipine  and atenolol .   Follow blood pressure.  Follow metabolic panel. No changes in medication today.

## 2023-09-27 NOTE — Assessment & Plan Note (Signed)
 Persistent cough. Appears to occur when eating/drinking. Discussed further evaluation. Will check cxr. Also, refer to ST for evaluation and treatment. Pepcid 20mg  q day.

## 2023-09-27 NOTE — Assessment & Plan Note (Signed)
 Low-carb diet and exercise.  Follow met b and A1c.

## 2023-09-27 NOTE — Assessment & Plan Note (Signed)
 Followed by neurology. Recommended to continue namenda . Continues seroquel  in the evening. Follow.

## 2023-09-27 NOTE — Assessment & Plan Note (Addendum)
 Saw [facility] 04/30/22 - Aorta - noninvasive studies show no evidence of an abdominal aortic aneurysm. Previous thoracic aortic aneurysm only measured 3.7 cm which is technically ectatic. Recommended f/u prn. Continue blood pressure control.

## 2023-09-30 ENCOUNTER — Other Ambulatory Visit: Payer: Self-pay | Admitting: Internal Medicine

## 2023-09-30 ENCOUNTER — Other Ambulatory Visit: Payer: Self-pay

## 2023-09-30 DIAGNOSIS — R059 Cough, unspecified: Secondary | ICD-10-CM

## 2023-09-30 MED ORDER — REPATHA SURECLICK 140 MG/ML ~~LOC~~ SOAJ
140.0000 mg | SUBCUTANEOUS | 2 refills | Status: DC
  Filled 2023-09-30: qty 2, 28d supply, fill #0

## 2023-09-30 NOTE — Progress Notes (Signed)
Order placed for modified barium swallow

## 2023-10-08 ENCOUNTER — Other Ambulatory Visit: Payer: Self-pay

## 2023-10-08 ENCOUNTER — Other Ambulatory Visit: Payer: Self-pay | Admitting: Internal Medicine

## 2023-10-09 ENCOUNTER — Other Ambulatory Visit: Payer: Self-pay

## 2023-10-12 ENCOUNTER — Other Ambulatory Visit: Payer: Self-pay

## 2023-10-13 ENCOUNTER — Other Ambulatory Visit: Payer: Self-pay | Admitting: Neurology

## 2023-10-13 ENCOUNTER — Other Ambulatory Visit: Payer: Self-pay

## 2023-10-13 ENCOUNTER — Encounter: Payer: Self-pay | Admitting: Neurology

## 2023-10-13 DIAGNOSIS — R1310 Dysphagia, unspecified: Secondary | ICD-10-CM

## 2023-10-13 DIAGNOSIS — F0280 Dementia in other diseases classified elsewhere without behavioral disturbance: Secondary | ICD-10-CM

## 2023-10-13 DIAGNOSIS — Z789 Other specified health status: Secondary | ICD-10-CM

## 2023-10-13 DIAGNOSIS — F0150 Vascular dementia without behavioral disturbance: Secondary | ICD-10-CM

## 2023-10-13 DIAGNOSIS — R4189 Other symptoms and signs involving cognitive functions and awareness: Secondary | ICD-10-CM

## 2023-10-13 MED FILL — Quetiapine Fumarate Tab 25 MG: ORAL | 90 days supply | Qty: 90 | Fill #0 | Status: AC

## 2023-10-13 NOTE — Telephone Encounter (Signed)
Rx ok'd for seroquel

## 2023-10-19 ENCOUNTER — Other Ambulatory Visit: Payer: Self-pay

## 2023-10-21 ENCOUNTER — Ambulatory Visit
Admission: RE | Admit: 2023-10-21 | Discharge: 2023-10-21 | Disposition: A | Discharge status: Home / Self Care | Source: Ambulatory Visit | Attending: Neurology | Admitting: Neurology

## 2023-10-21 DIAGNOSIS — Z789 Other specified health status: Secondary | ICD-10-CM | POA: Insufficient documentation

## 2023-10-21 DIAGNOSIS — F0280 Dementia in other diseases classified elsewhere without behavioral disturbance: Secondary | ICD-10-CM | POA: Diagnosis not present

## 2023-10-21 DIAGNOSIS — R1310 Dysphagia, unspecified: Secondary | ICD-10-CM | POA: Insufficient documentation

## 2023-10-21 DIAGNOSIS — G309 Alzheimer's disease, unspecified: Secondary | ICD-10-CM | POA: Diagnosis not present

## 2023-10-21 DIAGNOSIS — F0150 Vascular dementia without behavioral disturbance: Secondary | ICD-10-CM | POA: Insufficient documentation

## 2023-10-21 DIAGNOSIS — R4189 Other symptoms and signs involving cognitive functions and awareness: Secondary | ICD-10-CM | POA: Diagnosis not present

## 2023-10-21 NOTE — Progress Notes (Signed)
 Modified Barium Swallow Study  Patient Details  Name: Beth Espinoza MRN: [id number] Date of Birth: 08/26/1935  Today's Date: 10/21/2023  Modified Barium Swallow completed.  Full report located under Chart Review in the Imaging Section.  History of Present Illness Pt is an 88yo female w/ PMH per MD notes including: Mixed Dementia (likely Alzheimer's Dis and Vascular); persistent memory issues and driving safety concerns due to disorientation; anxiety; HTN; osteoporosis; ascending aortic aneurysm; CAD; TIA; difficulty sleeping; Fall; headache/migraine.   Pt denied any particular difficulty eating a certain food or drink- stated she loves popcorn; no recent weight loss.  OF NOTE: Daughter present described difficulty w/ Bread- discussed the doughy property of breads.  Pt denied any Reflux issues(globus, significant belching, regurgitation)- noted Pepcid as a medication per an MD note. She is not on a PPI.   Pt has not been dx'd w/ pneumonia in recent years per chart/pt report; no hospitalization for such.  Recent CXR on 09/21/2023 revealed: No active cardiopulmonary disease.  OF NOTE: a CT Angio of Chest in 2020 revealed: Hiatal Hernia.   Clinical Impression Patient appears to present with Functional oropharyngeal phase swallowing, in setting of observed Esophageal phase dysmotility and prominent cricopharyngeus muscle presenting itself during the swallow.  ANY Esophageal phase deficits can hinder Esophageal clearing and impact the oropharyngeal phase of swallowing. Pt did not c/o any feelings of discomfort during swallowing of po trials; No cough occurred. NO aspiration nor laryngeal penetration occurred during this study.  Oropharyngeal phase swallowing physiology appeared [facility] during this study.   Oral phase is characterized by adequate lip closure, bolus preparation and containment, mastication, and anterior to posterior transit. Min more mastication time needed w/ the thicker, solid bolus- pt  c/o trial being dry also. Swallow initiation occurs primarily at the level of the Valleculae w/ majority of trials; some BOT>valleculae.    Pharyngeal phase is  noted for adequate tongue base retraction, adequate hyolaryngeal excursion, and adequate pharyngeal constriction. Pharyngeal stripping wave is complete. Epiglottic inversion is complete w/ trials; timely/tight epiglottic inversion/closure of airway entrance during swallowing noted. No aspiration nor laryngeal penetratiion noted. No consistent pharyngeal residue noted post swallow; min diffuse residue noted (x1) w/ the thicker, drier bolus trial which cleared b/t trials. No other pharyngeal residue noted.  Amplitude/duration of cricopharyngeus opening appeared [facility] There was adequate clearance through the UES w/ all bolus trials/consistencies; noted apparent bolus Stasis (min+) in the upper Cervical Esophagus w/ increased textured bolus consistencies given(viewable Esophageal area to the level of the shoulders).  A 13 mm barium tablet was not given- pt denied any difficulty swallowing pills/tablets at home.         Pt was educated on the results of the study immediately after; video viewed together and questions answered. The study was then discussed w/ Daughter in waiting area(and pt again). Explained that oropharyngeal phase swallowing physiology appeared [facility] during this study, though Esophageal phase Dysmotility(bolus stasis) was noted which may be the culprit of the cough heard during meals. Also discussed NO Straw use during drinking to avoid increased inhalation/sucking on straw. Other general aspiration precautions discussed including: small bites/sips; chewing foods well; not Talking w/ food in mouth. Recommended continuing w/ a Regular consistency diet(well-moistened foods for ease of Esophageal clearing), thin liquids. General aspiration precautions and monitoring of any s/s of Reflux behaviors during meals. Recommended f/u w/ PCP to  determine IF a GI consult is warranted for further assessment of suspected Esophageal phase Dysmotility. Factors that may increase risk  of adverse event in presence of aspiration Noe & Lianne 2021): GI disease; Reduced cognitive function   Swallow Evaluation Recommendations Recommendations: PO diet PO Diet Recommendation: Regular; Thin liquids (Level 0) -- (well-moistened foods) Liquid Administration via: Cup; No straw Medication Administration: Whole meds with liquid (vs Whole in a Puree if needed for ease of clearing) Supervision: Patient able to self-feed Swallowing strategies: Minimize environmental distractions and Talking; Slow rate; Small bites/sips; Follow solids with liquids; Chew foods well Postural changes: Position pt fully upright for meals; Stay upright 30-60 min after meals (general Reflux precs.) Oral care recommendations: Oral care BID (2x/day); Pt independent with oral care Recommended consults: Consider GI consultation; Consider esophageal assessment (IF indicated per PCP)       Comer Portugal, MS, CCC-SLP Speech Language Pathologist Rehab Services; [facility] [phone] (ascom) Elberta Lachapelle 10/21/2023,5:33 PM

## 2023-11-10 ENCOUNTER — Other Ambulatory Visit: Payer: Self-pay

## 2023-11-11 DIAGNOSIS — Z1331 Encounter for screening for depression: Secondary | ICD-10-CM | POA: Diagnosis not present

## 2023-11-11 DIAGNOSIS — G309 Alzheimer's disease, unspecified: Secondary | ICD-10-CM | POA: Diagnosis not present

## 2023-11-11 DIAGNOSIS — F0150 Vascular dementia without behavioral disturbance: Secondary | ICD-10-CM | POA: Diagnosis not present

## 2023-11-11 DIAGNOSIS — F0280 Dementia in other diseases classified elsewhere without behavioral disturbance: Secondary | ICD-10-CM | POA: Diagnosis not present

## 2023-11-26 ENCOUNTER — Other Ambulatory Visit: Payer: Self-pay

## 2023-11-26 ENCOUNTER — Other Ambulatory Visit: Payer: Self-pay | Admitting: Internal Medicine

## 2023-11-26 MED ORDER — FLUTICASONE PROPIONATE 50 MCG/ACT NA SUSP
1.0000 | Freq: Every day | NASAL | 0 refills | Status: DC
  Filled 2023-11-26: qty 16, 60d supply, fill #0

## 2024-01-10 MED FILL — Quetiapine Fumarate Tab 25 MG: ORAL | 90 days supply | Qty: 90 | Fill #1 | Status: AC

## 2024-01-15 ENCOUNTER — Other Ambulatory Visit: Payer: Self-pay

## 2024-01-15 MED ORDER — MEMANTINE HCL 5 MG PO TABS
5.0000 mg | ORAL_TABLET | Freq: Every day | ORAL | 1 refills | Status: DC
  Filled 2024-01-15: qty 90, 90d supply, fill #0

## 2024-01-18 ENCOUNTER — Encounter: Payer: Self-pay | Admitting: Internal Medicine

## 2024-01-19 ENCOUNTER — Ambulatory Visit: Admitting: Internal Medicine

## 2024-01-19 ENCOUNTER — Other Ambulatory Visit: Payer: Self-pay

## 2024-01-19 VITALS — BP 138/78 | HR 55 | Temp 97.7°F | Ht 65.0 in | Wt 156.5 lb

## 2024-01-19 DIAGNOSIS — F419 Anxiety disorder, unspecified: Secondary | ICD-10-CM

## 2024-01-19 DIAGNOSIS — E7800 Pure hypercholesterolemia, unspecified: Secondary | ICD-10-CM

## 2024-01-19 DIAGNOSIS — F0280 Dementia in other diseases classified elsewhere without behavioral disturbance: Secondary | ICD-10-CM | POA: Diagnosis not present

## 2024-01-19 DIAGNOSIS — F0150 Vascular dementia without behavioral disturbance: Secondary | ICD-10-CM | POA: Diagnosis not present

## 2024-01-19 DIAGNOSIS — I7121 Aneurysm of the ascending aorta, without rupture: Secondary | ICD-10-CM | POA: Diagnosis not present

## 2024-01-19 DIAGNOSIS — R739 Hyperglycemia, unspecified: Secondary | ICD-10-CM | POA: Diagnosis not present

## 2024-01-19 DIAGNOSIS — I10 Essential (primary) hypertension: Secondary | ICD-10-CM | POA: Diagnosis not present

## 2024-01-19 DIAGNOSIS — D72829 Elevated white blood cell count, unspecified: Secondary | ICD-10-CM

## 2024-01-19 DIAGNOSIS — G309 Alzheimer's disease, unspecified: Secondary | ICD-10-CM | POA: Diagnosis not present

## 2024-01-19 DIAGNOSIS — I779 Disorder of arteries and arterioles, unspecified: Secondary | ICD-10-CM

## 2024-01-19 DIAGNOSIS — R011 Cardiac murmur, unspecified: Secondary | ICD-10-CM

## 2024-01-19 DIAGNOSIS — M810 Age-related osteoporosis without current pathological fracture: Secondary | ICD-10-CM | POA: Diagnosis not present

## 2024-01-19 MED ORDER — PAROXETINE HCL 20 MG PO TABS
20.0000 mg | ORAL_TABLET | Freq: Every day | ORAL | 1 refills | Status: AC
  Filled 2024-01-19: qty 90, 90d supply, fill #0
  Filled 2024-04-05 (×2): qty 90, 90d supply, fill #1

## 2024-01-19 MED ORDER — CLOPIDOGREL BISULFATE 75 MG PO TABS
75.0000 mg | ORAL_TABLET | Freq: Every day | ORAL | 1 refills | Status: AC
  Filled 2024-01-19: qty 90, 90d supply, fill #0
  Filled 2024-04-05 (×2): qty 90, 90d supply, fill #1

## 2024-01-19 MED ORDER — AMLODIPINE BESYLATE 5 MG PO TABS
5.0000 mg | ORAL_TABLET | Freq: Every day | ORAL | 1 refills | Status: AC
  Filled 2024-01-19 – 2024-02-29 (×2): qty 90, 90d supply, fill #0

## 2024-01-19 MED ORDER — ATENOLOL 25 MG PO TABS
25.0000 mg | ORAL_TABLET | Freq: Every day | ORAL | 1 refills | Status: AC
  Filled 2024-01-19 – 2024-02-29 (×2): qty 90, 90d supply, fill #0
  Filled 2024-04-05: qty 90, 90d supply, fill #1

## 2024-01-19 NOTE — Progress Notes (Signed)
 Subjective:    [name], female    DOB: 04/21/1935, 88 y.o.   MRN: [id number]  Patient here for  Chief Complaint  Patient presents with   Medical Management of Chronic Issues    4 month f/u    HPI Here for a scheduled follow up. Saw neurology - f/u memory issues. Per review, diagnosed with mixed dementia. Discussed memory today. Discussed the need for f/u with neurology. She is agreeable. Daughter Beth Espinoza) accompanies her today. She is living with Beth Espinoza and her husband - helping to take care of them. She is eating well. Reports stress is better since her daughter has been living there. No chest pain or sob reported. No abdominal pain or bowel change reported.    Past Medical History:  Diagnosis Date   Anxiety    Arthritis    Cataract    History of chicken pox    History of kidney stones October 2013   Hx: UTI (urinary tract infection)    Hyperlipidemia    Hypertension    Rheumatoid arthritis (HCC)    Stroke (HCC)    tia   Past Surgical History:  Procedure Laterality Date   ABDOMINAL HYSTERECTOMY     BREAST BIOPSY Right 1995   CATARACT EXTRACTION W/PHACO Left 04/13/2018   Procedure: CATARACT EXTRACTION PHACO AND INTRAOCULAR LENS PLACEMENT (IOC) Left Eye;  Surgeon: Jaye Fallow, MD;  Location: [facility] ORS;  Service: Ophthalmology;  Laterality: Left;  US   00:47.0 CDE 5.52 Fluid pack lot # [id number]   EYE SURGERY     IR ANGIO INTRA EXTRACRAN SEL COM CAROTID INNOMINATE BILAT MOD SED  07/18/2016   IR ANGIO VERTEBRAL SEL VERTEBRAL BILAT MOD SED  07/18/2016   PARTIAL HYSTERECTOMY  1972   TONSILLECTOMY AND ADENOIDECTOMY  1945   Family History  Problem Relation Age of Onset   Asthma Mother    Diabetes Mother    Cataracts Mother    Heart disease Father    Diabetes Father    Hypertension Father    Cancer Paternal Aunt        Liver   Diabetes Paternal Grandmother    Diabetes Sister    Retinal degeneration Sister    Glaucoma Sister    Social History    Socioeconomic History   Marital status: Married    Spouse name: Not on file   Number of children: Not on file   Years of education: Not on file   Highest education level: Bachelor's degree (e.g., BA, AB, BS)  Occupational History   Not on file  Tobacco Use   Smoking status: Never   Smokeless tobacco: Never  Vaping Use   Vaping status: Unknown  Substance and Sexual Activity   Alcohol use: No    Alcohol/week: 0.0 standard drinks of alcohol   Drug use: No   Sexual activity: Not on file  Other Topics Concern   Not on file  Social History Narrative   ** Merged History Encounter **       Social Drivers of Health   Financial Resource Strain: Low Risk  (01/18/2024)   Overall Financial Resource Strain (CARDIA)    Difficulty of Paying Living Expenses: Not hard at all  Food Insecurity: No Food Insecurity (01/18/2024)   Hunger Vital Sign    Worried About Running Out of Food in the Last Year: Never true    Ran Out of Food in the Last Year: Never true  Transportation Needs: No Transportation Needs (01/18/2024)  PRAPARE - Administrator, Civil Service (Medical): No    Lack of Transportation (Non-Medical): No  Physical Activity: Insufficiently Active (01/18/2024)   Exercise Vital Sign    Days of Exercise per Week: 4 days    Minutes of Exercise per Session: 10 min  Stress: No Stress Concern Present (01/18/2024)   [employer] of Occupational Health - Occupational Stress Questionnaire    Feeling of Stress: Only a little  Social Connections: Socially Isolated (01/18/2024)   Social Connection and Isolation Panel    Frequency of Communication with Friends and Family: Once a week    Frequency of Social Gatherings with Friends and Family: Once a week    Attends Religious Services: Never    Database Administrator or Organizations: No    Attends Engineer, Structural: Not on file    Marital Status: Married     Review of Systems  Constitutional:  Negative  for appetite change and unexpected weight change.  HENT:  Negative for congestion and sinus pressure.   Respiratory:  Negative for cough, chest tightness and shortness of breath.   Cardiovascular:  Negative for chest pain, palpitations and leg swelling.  Gastrointestinal:  Negative for abdominal pain, diarrhea, nausea and vomiting.  Genitourinary:  Negative for difficulty urinating and dysuria.  Musculoskeletal:  Negative for joint swelling and myalgias.  Skin:  Negative for color change and rash.  Neurological:  Negative for dizziness and headaches.  Psychiatric/Behavioral:  Negative for agitation and dysphoric mood.        Objective:     BP 138/78   Pulse (!) 55   Temp 97.7 F (36.5 C) (Oral)   Ht 5' 5 (1.651 m)   Wt 156 lb 8 oz (71 kg)   SpO2 98%   BMI 26.04 kg/m  Wt Readings from Last 3 Encounters:  01/19/24 156 lb 8 oz (71 kg)  09/21/23 154 lb 6.4 oz (70 kg)  05/21/23 156 lb 3.2 oz (70.9 kg)    Physical Exam Vitals reviewed.  Constitutional:      General: She is not in acute distress.    Appearance: Normal appearance.  HENT:     Head: Normocephalic and atraumatic.     Right Ear: External ear normal.     Left Ear: External ear normal.     Mouth/Throat:     Pharynx: No oropharyngeal exudate or posterior oropharyngeal erythema.  Eyes:     General: No scleral icterus.       Right eye: No discharge.        Left eye: No discharge.     Conjunctiva/sclera: Conjunctivae normal.  Neck:     Thyroid : No thyromegaly.  Cardiovascular:     Rate and Rhythm: Normal rate and regular rhythm.     Comments: 1-2/6 systolic murmur.  Pulmonary:     Effort: No respiratory distress.     Breath sounds: Normal breath sounds. No wheezing.  Abdominal:     General: Bowel sounds are normal.     Palpations: Abdomen is soft.     Tenderness: There is no abdominal tenderness.  Musculoskeletal:        General: No swelling or tenderness.     Cervical back: Neck supple. No tenderness.   Lymphadenopathy:     Cervical: No cervical adenopathy.  Skin:    Findings: No erythema or rash.  Neurological:     Mental Status: She is alert.  Psychiatric:        Mood and  Affect: Mood normal.        Behavior: Behavior normal.         Outpatient Encounter Medications as of 01/19/2024  Medication Sig   cholecalciferol  (VITAMIN D ) 25 MCG tablet Take 5 tablets (5,000 Units total) by mouth daily.   fexofenadine (ALLEGRA) 180 MG tablet Take 180 mg by mouth daily.   folic acid  (FOLVITE ) 1 MG tablet Take 2 mg by mouth daily.    magnesium oxide (MAG-OX) 400 MG tablet Take by mouth.   memantine  (NAMENDA ) 5 MG tablet Take 1 tablet (5 mg total) by mouth at bedtime.   QUEtiapine  (SEROQUEL ) 25 MG tablet Take 1 tablet (25 mg total) by mouth at bedtime.   amLODipine  (NORVASC ) 5 MG tablet Take 1 tablet (5 mg total) by mouth daily.   atenolol  (TENORMIN ) 25 MG tablet Take 1 tablet (25 mg total) by mouth daily.   clopidogrel  (PLAVIX ) 75 MG tablet Take 1 tablet (75 mg total) by mouth daily.   PARoxetine  (PAXIL ) 20 MG tablet Take 1 tablet (20 mg total) by mouth daily.   [DISCONTINUED] amLODipine  (NORVASC ) 5 MG tablet Take 1 tablet (5 mg total) by mouth daily.   [DISCONTINUED] atenolol  (TENORMIN ) 25 MG tablet Take 1 tablet (25 mg total) by mouth daily.   [DISCONTINUED] clopidogrel  (PLAVIX ) 75 MG tablet Take 1 tablet (75 mg total) by mouth daily.   [DISCONTINUED] Evolocumab  (REPATHA  SURECLICK) 140 MG/ML SOAJ Inject 140 mg into the skin every 14 (fourteen) days.   [DISCONTINUED] fluticasone  (FLONASE ) 50 MCG/ACT nasal spray Place 1 spray into both nostrils daily. (Patient not taking: Reported on 01/19/2024)   [DISCONTINUED] loratadine  (CLARITIN ) 10 MG tablet Take 1 tablet (10 mg total) by mouth daily. (Patient not taking: Reported on 01/19/2024)   [DISCONTINUED] memantine  (NAMENDA ) 5 MG tablet Take 1 tablet (5 mg total) by mouth at bedtime.   [DISCONTINUED] PARoxetine  (PAXIL ) 20 MG tablet Take 1 tablet  (20 mg total) by mouth daily.   No facility-administered encounter medications on file as of 01/19/2024.     Lab Results  Component Value Date   WBC 12.6 (H) 01/19/2024   HGB 13.2 01/19/2024   HCT 39.6 01/19/2024   PLT 342.0 01/19/2024   GLUCOSE 88 01/19/2024   CHOL 245 (H) 01/19/2024   TRIG 207.0 (H) 01/19/2024   HDL 40.10 01/19/2024   LDLDIRECT 181.0 05/28/2021   LDLCALC 164 (H) 01/19/2024   ALT 16 01/19/2024   AST 20 01/19/2024   NA 138 01/19/2024   K 4.4 01/19/2024   CL 102 01/19/2024   CREATININE 1.04 01/19/2024   BUN 21 01/19/2024   CO2 28 01/19/2024   TSH 2.12 05/21/2023   INR 1.07 07/18/2016   HGBA1C 6.2 01/19/2024    DG SWALLOW FUNC OP MEDICARE SPEECH PATH Result Date: 10/21/2023 Table formatting from the original result was not included. Modified Barium Swallow Study Patient Details Name: GAYLENE MOYLAN MRN: [id number] Date of Birth: 1935-05-16 Today's Date: 10/21/2023 HPI/PMH: Pt is an 88yo female w/ PMH per MD notes including: Mixed Dementia (likely Alzheimer's Dis and Vascular); persistent memory issues and driving safety concerns due to disorientation; anxiety; HTN; osteoporosis; ascending aortic aneurysm; CAD; TIA; difficulty sleeping; Fall; headache/migraine.  Pt denied any particular difficulty eating a certain food or drink- stated she loves popcorn; no recent weight loss.  OF NOTE: Daughter present described difficulty w/ Bread- discussed the doughy property of breads.  Pt denied any Reflux issues(globus, significant belching, regurgitation)- noted Pepcid as a medication per an MD note.  She is not on a PPI.  Pt has not been dx'd w/ pneumonia in recent years per chart/pt report; no hospitalization for such.  Recent CXR on 09/21/2023 revealed: No active cardiopulmonary disease.  OF NOTE: a CT Angio of Chest in 2020 revealed: Hiatal Hernia. Clinical Impression Patient appears to present with Functional oropharyngeal phase swallowing, in setting of observed Esophageal  phase dysmotility and prominent cricopharyngeus muscle presenting itself during the swallow.  ANY Esophageal phase deficits can hinder Esophageal clearing and impact the oropharyngeal phase of swallowing. Pt did not c/o any feelings of discomfort during swallowing of po trials; No cough occurred. NO aspiration nor laryngeal penetration occurred during this study.  Oropharyngeal phase swallowing physiology appeared [facility] during this study. Oral phase is characterized by adequate lip closure, bolus preparation and containment, mastication, and anterior to posterior transit. Min more mastication time needed w/ the thicker, solid bolus- pt c/o trial being dry also. Swallow initiation occurs primarily at the level of the Valleculae w/ majority of trials; some BOT>valleculae.   Pharyngeal phase is  noted for adequate tongue base retraction, adequate hyolaryngeal excursion, and adequate pharyngeal constriction. Pharyngeal stripping wave is complete. Epiglottic inversion is complete w/ trials; timely/tight epiglottic inversion/closure of airway entrance during swallowing noted. No aspiration nor laryngeal penetratiion noted. No consistent pharyngeal residue noted post swallow; min diffuse residue noted (x1) w/ the thicker, drier bolus trial which cleared b/t trials. No other pharyngeal residue noted. Amplitude/duration of cricopharyngeus opening appeared [facility]. There was adequate clearance through the UES w/ all bolus trials/consistencies; noted apparent bolus Stasis (min+) in the upper Cervical Esophagus w/ increased textured bolus consistencies given(viewable Esophageal area to the level of the shoulders).  A 13 mm barium tablet was not given- pt denied any difficulty swallowing pills/tablets at home.       Pt was educated on the results of the study immediately after; video viewed together and questions answered. The study was then discussed w/ Daughter in waiting area(and pt again). Explained that oropharyngeal phase  swallowing physiology appeared [facility] during this study, though Esophageal phase Dysmotility(bolus stasis) was noted which may be the culprit of the cough heard during meals. Also discussed NO Straw use during drinking to avoid increased inhalation/sucking on straw. Other general aspiration precautions discussed including: small bites/sips; chewing foods well; not Talking w/ food in mouth. Recommended continuing w/ a Regular consistency diet(well-moistened foods for ease of Esophageal clearing), thin liquids. General aspiration precautions and monitoring of any s/s of Reflux behaviors during meals. Recommended f/u w/ PCP to determine IF a GI consult is warranted for further assessment of suspected Esophageal phase Dysmotility. Factors that may increase risk of adverse event in presence of aspiration Noe & Lianne 2021): GI disease; Reduced cognitive function Swallow Evaluation Recommendations Recommendations: PO diet PO Diet Recommendation: Regular; Thin liquids (Level 0) -- (well-moistened foods) Liquid Administration via: Cup; No straw Medication Administration: Whole meds with liquid (vs Whole in a Puree if needed for ease of clearing) Supervision: Patient able to self-feed Swallowing strategies: Minimize environmental distractions and Talking; Slow rate; Small bites/sips; Follow solids with liquids; Chew foods well Postural changes: Position pt fully upright for meals; Stay upright 30-60 min after meals (general Reflux precs.) Oral care recommendations: Oral care BID (2x/day); Pt independent with oral care Recommended consults: Consider GI consultation; Consider esophageal assessment (IF indicated per PCP) Treatment Plan Treatment Plan Treatment recommendations: No treatment recommended at this time Follow-up recommendations: No SLP follow up Recommendations Comment: discussion w/ PCP Functional status assessment:  Patient has not had a recent decline in their functional status. Treatment frequency: -- (n/a)  Treatment duration: -- (n/a) Interventions: Aspiration precaution training; Patient/family education (General Reflux precautions) Recommendations Recommendations for follow up are one component of a multi-disciplinary discharge planning process, led by the attending physician.  Recommendations may be updated based on patient status, additional functional criteria and insurance authorization. Assessment: Orofacial Exam: Orofacial Exam Oral Cavity: Oral Hygiene: WFL Oral Cavity - Dentition: Adequate natural dentition Orofacial Anatomy: WFL Oral Motor/Sensory Function: WFL Anatomy: Anatomy: Prominent cricopharyngeus (during the swallow) Boluses Administered: Boluses Administered Boluses Administered: Thin liquids (Level 0); Mildly thick liquids (Level 2, nectar thick); Moderately thick liquids (Level 3, honey thick); Puree; Solid  Oral Impairment Domain: Oral Impairment Domain Lip Closure: No labial escape Tongue control during bolus hold: Cohesive bolus between tongue to palatal seal Bolus preparation/mastication: Timely and efficient chewing and mashing (stated 1 bolus did not taste good, and 1 bolus was dry) Bolus transport/lingual motion: Brisk tongue motion Oral residue: Complete oral clearance Location of oral residue : N/A Initiation of pharyngeal swallow : Valleculae (BOT>Valleculae at times)  Pharyngeal Impairment Domain: Pharyngeal Impairment Domain Soft palate elevation: No bolus between soft palate (SP)/pharyngeal wall (PW) Laryngeal elevation: Complete superior movement of thyroid  cartilage with complete approximation of arytenoids to epiglottic petiole Anterior hyoid excursion: Complete anterior movement Epiglottic movement: Complete inversion Laryngeal vestibule closure: Complete, no air/contrast in laryngeal vestibule Pharyngeal stripping wave : Present - complete Pharyngeal contraction (A/P view only): N/A Pharyngoesophageal segment opening: Complete distension and complete duration, no obstruction of  flow Tongue base retraction: No contrast between tongue base and posterior pharyngeal wall (PPW) Pharyngeal residue: Trace residue within or on pharyngeal structures Location of pharyngeal residue: -- (diffuse- pt stated the bolus was too dry and thick)  Esophageal Impairment Domain: Esophageal Impairment Domain Esophageal clearance upright position: Esophageal retention (w/ increased textured trials) Pill: Pill Consistency administered: -- (n/a) Penetration/Aspiration Scale Score: Penetration/Aspiration Scale Score 1.  Material does not enter airway: Thin liquids (Level 0); Mildly thick liquids (Level 2, nectar thick); Moderately thick liquids (Level 3, honey thick); Puree; Solid Compensatory Strategies: Compensatory Strategies Compensatory strategies: -- (N/A)   General Information: Caregiver present: Yes  Diet Prior to this Study: Regular; Thin liquids (Level 0)   No data recorded  Respiratory Status: WFL   Supplemental O2: None (Room air)   History of Recent Intubation: No  Behavior/Cognition: Alert; Cooperative; Pleasant mood; Confused (Known Dementia per chart) Self-Feeding Abilities: Able to self-feed Baseline vocal quality/speech: Normal Volitional Cough: Able to elicit Volitional Swallow: Able to elicit Exam Limitations: No limitations Goal Planning: Prognosis for improved oropharyngeal function: Good Barriers to Reach Goals: Cognitive deficits; Severity of deficits; Time post onset Barriers/Prognosis Comment: Esophageal phase Dysmotility; Dementia Patient/Family Stated Goal: none given Consulted and agree with results and recommendations: Patient; Family member/caregiver Pain: Pain Assessment Pain Assessment: No/denies pain End of Session: Start Time:SLP Start Time (ACUTE ONLY): 1300 Stop Time: SLP Stop Time (ACUTE ONLY): 1415 Time Calculation:SLP Time Calculation (min) (ACUTE ONLY): 75 min Charges: SLP Evaluations $ SLP Speech Visit: 1 Visit SLP Evaluations $MBS Swallow: 1 Procedure SLP visit diagnosis:  SLP Visit Diagnosis: Dysphagia, unspecified (R13.10) (Esophageal phase Dysmotility suspected; Known Dementia) Past Medical History: Past Medical History: Diagnosis Date  Anxiety   Arthritis   Cataract   History of chicken pox   History of kidney stones October 2013  Hx: UTI (urinary tract infection)   Hyperlipidemia   Hypertension   Rheumatoid arthritis (HCC)  Stroke [facility])   tia Past Surgical History: Past Surgical History: Procedure Laterality Date  ABDOMINAL HYSTERECTOMY    BREAST BIOPSY Right 1995  CATARACT EXTRACTION W/PHACO Left 04/13/2018  Procedure: CATARACT EXTRACTION PHACO AND INTRAOCULAR LENS PLACEMENT (IOC) Left Eye;  Surgeon: Jaye Fallow, MD;  Location: [facility] ORS;  Service: Ophthalmology;  Laterality: Left;  US   00:47.0 CDE 5.52 Fluid pack lot # [id number]  EYE SURGERY    IR ANGIO INTRA EXTRACRAN SEL COM CAROTID INNOMINATE BILAT MOD SED  07/18/2016  IR ANGIO VERTEBRAL SEL VERTEBRAL BILAT MOD SED  07/18/2016  PARTIAL HYSTERECTOMY  1972  TONSILLECTOMY AND ADENOIDECTOMY  1945 [place], Beth, CCC-SLP Speech Language Pathologist Rehab Services; [facility] [phone] (ascom) Watson,Katherine 10/21/2023, 5:43 PM CLINICAL DATA:  Patient with dementia. Family concerns that they have noticed patient coughing during meals inconsistently. EXAM: MODIFIED BARIUM SWALLOW TECHNIQUE: Radiologist, not in attendance for the exam. Different consistencies of barium were administered orally to the patient by the Speech Pathologist. Imaging of the pharynx was performed in the lateral projection. The radiology APP, Brittany Huneycutt, NP, was present in the fluoroscopy room for this study, providing personal supervision. FLUOROSCOPY: Radiation Exposure Index (as provided by the fluoroscopic device): 5 mGy Kerma COMPARISON:  06/28/15 DG Esophagus FINDINGS: Vestibular  Penetration:  None seen. Aspiration:  None seen. Other: Prominent cricopharyngeal bar without diverticula noted. IMPRESSION: No vestibular  penetration or aspiration was seen with any consistency. Please refer to the Speech Pathologists report for complete details and recommendations. Laymon Coast, NP present for exam.  Exam read by Dr. Marthann. Electronically Signed   By: Rockey Marthann M.D.   On: 10/21/2023 16:12      Assessment & Plan:  Anxiety Assessment & Plan: Continues on paxil  and seroquel . Overall doing better since daughter moved in. No change in medication today. Follow.    Leukocytosis, unspecified type -     CBC with Differential/Platelet  Essential hypertension Assessment & Plan: Continue amlodipine  and atenolol .   Follow blood pressure.  Follow metabolic panel. No change in medication today.   Orders: -     Basic metabolic panel with GFR  Hypercholesterolemia Assessment & Plan: Intolerance to zetia  and crestor . Has stopped repatha .   Low cholesterol diet and exercise.  Follow lipid panel. Check lipid panel today.   Orders: -     Hepatic function panel -     Lipid panel  Hyperglycemia Assessment & Plan: Low carb diet and exercise. Follow met b and A1c.   Orders: -     Hemoglobin A1c  Osteoporosis, unspecified osteoporosis type, unspecified pathological fracture presence Assessment & Plan: Had reaction to reclast .  Calcium , vitamin D  and weight bearing exercise. Needs f/u bone density and consider prolia.     Mixed Alzheimer's and vascular dementia [facility]) Assessment & Plan: Has seen neurology. Continues on namenda . Discussed today - persistent memory concerns. Plan earlier f/u with neurology. Feel she would benefit from day program, senior center, etc.    Bilateral carotid artery disease, unspecified type Assessment & Plan: Evaluated [facility] 04/30/22 - Carotid ultrasound - normal extracranial vessels with no significant stenosis of the bilateral internal carotid arteries. The bilateral vertebral arteries have antegrade flow with normal flow hemodynamics in the bilateral subclavian arteries.   Recommended f/u prn. Continue risk factor modification. Check cholesterol today.    Aneurysm of ascending aorta without rupture Assessment & Plan: Saw [facility] 04/30/22 - Aorta - noninvasive studies show no evidence of an abdominal aortic aneurysm. Previous thoracic aortic  aneurysm only measured 3.7 cm which is technically ectatic. Recommended f/u prn. Continue blood pressure control.    Murmur Assessment & Plan: Murmur - augible. Check echo to evaluate valve status.   Orders: -     ECHOCARDIOGRAM COMPLETE; Future  Other orders -     amLODIPine  Besylate; Take 1 tablet (5 mg total) by mouth daily.  Dispense: 90 tablet; Refill: 1 -     Atenolol ; Take 1 tablet (25 mg total) by mouth daily.  Dispense: 90 tablet; Refill: 1 -     Clopidogrel  Bisulfate; Take 1 tablet (75 mg total) by mouth daily.  Dispense: 90 tablet; Refill: 1 -     PARoxetine  HCl; Take 1 tablet (20 mg total) by mouth daily.  Dispense: 90 tablet; Refill: 1     Allena Hamilton, MD

## 2024-01-19 NOTE — Telephone Encounter (Signed)
 Called and spoke to [place].

## 2024-01-20 LAB — CBC WITH DIFFERENTIAL/PLATELET
Basophils Absolute: 0.2 K/uL — ABNORMAL HIGH (ref 0.0–0.1)
Basophils Relative: 1.8 % (ref 0.0–3.0)
Eosinophils Absolute: 0.3 K/uL (ref 0.0–0.7)
Eosinophils Relative: 2.7 % (ref 0.0–5.0)
HCT: 39.6 % (ref 36.0–46.0)
Hemoglobin: 13.2 g/dL (ref 12.0–15.0)
Lymphocytes Relative: 25.8 % (ref 12.0–46.0)
Lymphs Abs: 3.3 K/uL (ref 0.7–4.0)
MCHC: 33.4 g/dL (ref 30.0–36.0)
MCV: 90.3 fl (ref 78.0–100.0)
Monocytes Absolute: 0.9 K/uL (ref 0.1–1.0)
Monocytes Relative: 7.2 % (ref 3.0–12.0)
Neutro Abs: 7.9 K/uL — ABNORMAL HIGH (ref 1.4–7.7)
Neutrophils Relative %: 62.5 % (ref 43.0–77.0)
Platelets: 342 K/uL (ref 150.0–400.0)
RBC: 4.39 Mil/uL (ref 3.87–5.11)
RDW: 14.8 % (ref 11.5–15.5)
WBC: 12.6 K/uL — ABNORMAL HIGH (ref 4.0–10.5)

## 2024-01-20 LAB — BASIC METABOLIC PANEL WITH GFR
BUN: 21 mg/dL (ref 6–23)
CO2: 28 meq/L (ref 19–32)
Calcium: 10 mg/dL (ref 8.4–10.5)
Chloride: 102 meq/L (ref 96–112)
Creatinine, Ser: 1.04 mg/dL (ref 0.40–1.20)
GFR: 48.69 mL/min — ABNORMAL LOW (ref >60.00)
Glucose, Bld: 88 mg/dL (ref 70–99)
Potassium: 4.4 meq/L (ref 3.5–5.1)
Sodium: 138 meq/L (ref 135–145)

## 2024-01-20 LAB — LIPID PANEL
Cholesterol: 245 mg/dL — ABNORMAL HIGH (ref 0–200)
HDL: 40.1 mg/dL (ref >39.00)
LDL Cholesterol: 164 mg/dL — ABNORMAL HIGH (ref 0–99)
NonHDL: 205.05
Total CHOL/HDL Ratio: 6
Triglycerides: 207 mg/dL — ABNORMAL HIGH (ref 0.0–149.0)
VLDL: 41.4 mg/dL — ABNORMAL HIGH (ref 0.0–40.0)

## 2024-01-20 LAB — HEPATIC FUNCTION PANEL
ALT: 16 U/L (ref 0–35)
AST: 20 U/L (ref 0–37)
Albumin: 4.3 g/dL (ref 3.5–5.2)
Alkaline Phosphatase: 37 U/L — ABNORMAL LOW (ref 39–117)
Bilirubin, Direct: 0 mg/dL (ref 0.0–0.3)
Total Bilirubin: 0.4 mg/dL (ref 0.2–1.2)
Total Protein: 7.4 g/dL (ref 6.0–8.3)

## 2024-01-20 LAB — HEMOGLOBIN A1C: Hgb A1c MFr Bld: 6.2 % (ref 4.6–6.5)

## 2024-01-21 ENCOUNTER — Ambulatory Visit: Payer: Self-pay | Admitting: Internal Medicine

## 2024-01-27 ENCOUNTER — Telehealth: Payer: Self-pay | Admitting: Internal Medicine

## 2024-01-27 ENCOUNTER — Encounter: Payer: Self-pay | Admitting: Internal Medicine

## 2024-01-27 DIAGNOSIS — R011 Cardiac murmur, unspecified: Secondary | ICD-10-CM | POA: Insufficient documentation

## 2024-01-27 NOTE — Telephone Encounter (Signed)
 Called neurology regarding earlier follow up appt per request during her visit with me. Appt made 03/01/24 -at 1:30. My chart message sent with this information.

## 2024-01-27 NOTE — Assessment & Plan Note (Signed)
Had reaction to reclast.  Calcium, vitamin D and weight bearing exercise. Needs f/u bone density and consider prolia.

## 2024-01-27 NOTE — Assessment & Plan Note (Signed)
 Continue amlodipine  and atenolol .   Follow blood pressure.  Follow metabolic panel. No change in medication today.

## 2024-01-27 NOTE — Assessment & Plan Note (Signed)
 Has seen neurology. Continues on namenda . Discussed today - persistent memory concerns. Plan earlier f/u with neurology. Feel she would benefit from day program, senior center, etc.

## 2024-01-27 NOTE — Assessment & Plan Note (Signed)
 Murmur - augible. Check echo to evaluate valve status.

## 2024-01-27 NOTE — Assessment & Plan Note (Signed)
 Continues on paxil  and seroquel . Overall doing better since daughter moved in. No change in medication today. Follow.

## 2024-01-27 NOTE — Assessment & Plan Note (Signed)
 Saw [facility] 04/30/22 - Aorta - noninvasive studies show no evidence of an abdominal aortic aneurysm. Previous thoracic aortic aneurysm only measured 3.7 cm which is technically ectatic. Recommended f/u prn. Continue blood pressure control.

## 2024-01-27 NOTE — Assessment & Plan Note (Signed)
 Low-carb diet and exercise.  Follow met b and A1c.

## 2024-01-27 NOTE — Assessment & Plan Note (Addendum)
 Evaluated [facility] 04/30/22 - Carotid ultrasound - normal extracranial vessels with no significant stenosis of the bilateral internal carotid arteries. The bilateral vertebral arteries have antegrade flow with normal flow hemodynamics in the bilateral subclavian arteries.  Recommended f/u prn. Continue risk factor modification. Check cholesterol today.

## 2024-01-27 NOTE — Assessment & Plan Note (Signed)
 Intolerance to zetia  and crestor . Has stopped repatha .   Low cholesterol diet and exercise.  Follow lipid panel. Check lipid panel today.

## 2024-02-29 ENCOUNTER — Other Ambulatory Visit: Payer: Self-pay

## 2024-03-01 ENCOUNTER — Other Ambulatory Visit: Payer: Self-pay

## 2024-03-01 MED ORDER — MEMANTINE HCL 5 MG PO TABS
5.0000 mg | ORAL_TABLET | Freq: Two times a day (BID) | ORAL | 2 refills | Status: AC
  Filled 2024-03-01 – 2024-04-05 (×3): qty 180, 90d supply, fill #0

## 2024-03-05 ENCOUNTER — Other Ambulatory Visit: Payer: Self-pay

## 2024-04-05 ENCOUNTER — Other Ambulatory Visit: Payer: Self-pay

## 2024-04-05 ENCOUNTER — Other Ambulatory Visit: Payer: Self-pay | Admitting: Internal Medicine

## 2024-04-06 ENCOUNTER — Other Ambulatory Visit: Payer: Self-pay

## 2024-04-08 ENCOUNTER — Other Ambulatory Visit (HOSPITAL_COMMUNITY): Payer: Self-pay

## 2024-04-08 ENCOUNTER — Other Ambulatory Visit: Payer: Self-pay

## 2024-04-08 MED ORDER — QUETIAPINE FUMARATE 25 MG PO TABS
25.0000 mg | ORAL_TABLET | Freq: Every day | ORAL | 1 refills | Status: AC
  Filled 2024-04-08: qty 90, 90d supply, fill #0

## 2024-04-08 NOTE — Telephone Encounter (Signed)
 Pt's daughter was not available when I called but the pt stated that she would have her give us  a call back.

## 2024-04-08 NOTE — Telephone Encounter (Signed)
 Please call and confirm with daughter that she is taking medication regularly.

## 2024-04-08 NOTE — Telephone Encounter (Signed)
 Copied from CRM [id number]. Topic: Clinical - Medication Question >> Apr 08, 2024  1:33 PM Alfonso ORN wrote: Reason for CRM: pt called to return call from [place]. relayed message from provider . pt daughter stated that pt is currently taking the medications regularly and that pt daughter put order in for medication

## 2024-04-08 NOTE — Telephone Encounter (Signed)
Rx ok'd for seroquel

## 2024-05-09 ENCOUNTER — Ambulatory Visit

## 2024-05-19 ENCOUNTER — Ambulatory Visit: Admitting: Internal Medicine
